# Patient Record
Sex: Female | Born: 1940
Health system: Southern US, Community
[De-identification: ages and names within clinical notes are randomized; demographics above are authoritative.]

## PROBLEM LIST (undated history)

## (undated) DIAGNOSIS — I10 Essential (primary) hypertension: Secondary | ICD-10-CM

## (undated) DIAGNOSIS — E785 Hyperlipidemia, unspecified: Secondary | ICD-10-CM

## (undated) DIAGNOSIS — Z794 Long term (current) use of insulin: Secondary | ICD-10-CM

## (undated) DIAGNOSIS — J189 Pneumonia, unspecified organism: Secondary | ICD-10-CM

## (undated) DIAGNOSIS — E109 Type 1 diabetes mellitus without complications: Secondary | ICD-10-CM

## (undated) DIAGNOSIS — Z974 Presence of external hearing-aid: Secondary | ICD-10-CM

## (undated) DIAGNOSIS — E782 Mixed hyperlipidemia: Secondary | ICD-10-CM

## (undated) DIAGNOSIS — R229 Localized swelling, mass and lump, unspecified: Secondary | ICD-10-CM

## (undated) DIAGNOSIS — Z973 Presence of spectacles and contact lenses: Secondary | ICD-10-CM

## (undated) DIAGNOSIS — G43909 Migraine, unspecified, not intractable, without status migrainosus: Secondary | ICD-10-CM

## (undated) DIAGNOSIS — G4710 Hypersomnia, unspecified: Secondary | ICD-10-CM

## (undated) DIAGNOSIS — E119 Type 2 diabetes mellitus without complications: Secondary | ICD-10-CM

## (undated) DIAGNOSIS — E039 Hypothyroidism, unspecified: Secondary | ICD-10-CM

## (undated) DIAGNOSIS — G47411 Narcolepsy with cataplexy: Secondary | ICD-10-CM

## (undated) HISTORY — PX: OTHER SURGICAL HISTORY: SHX169

## (undated) HISTORY — DX: Essential (primary) hypertension: I10

## (undated) HISTORY — DX: Migraine, unspecified, not intractable, without status migrainosus: G43.909

## (undated) HISTORY — DX: Narcolepsy with cataplexy: G47.411

## (undated) HISTORY — PX: PARTIAL HYSTERECTOMY: SHX80

## (undated) HISTORY — DX: Pneumonia, unspecified organism: J18.9

## (undated) HISTORY — DX: Hyperlipidemia, unspecified: E78.5

## (undated) HISTORY — DX: Hypothyroidism, unspecified: E03.9

---
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
## 1969-06-09 HISTORY — PX: VAGINAL HYSTERECTOMY: SUR661

## 1975-02-05 HISTORY — PX: POSTERIOR CERVICAL FUSION/FORAMINOTOMY: SHX5038

## 2001-08-07 DIAGNOSIS — J189 Pneumonia, unspecified organism: Secondary | ICD-10-CM

## 2001-08-07 HISTORY — DX: Pneumonia, unspecified organism: J18.9

## 2001-08-11 ENCOUNTER — Inpatient Hospital Stay (HOSPITAL_COMMUNITY): Admission: EM | Admit: 2001-08-11 | Discharge: 2001-08-13 | Payer: Self-pay | Admitting: *Deleted

## 2001-08-12 ENCOUNTER — Encounter: Payer: Self-pay | Admitting: Family Medicine

## 2001-08-23 ENCOUNTER — Encounter: Admission: RE | Admit: 2001-08-23 | Discharge: 2001-08-23 | Payer: Self-pay | Admitting: Family Medicine

## 2001-12-06 ENCOUNTER — Emergency Department (HOSPITAL_COMMUNITY): Admission: EM | Admit: 2001-12-06 | Discharge: 2001-12-06 | Payer: Self-pay

## 2004-11-28 ENCOUNTER — Ambulatory Visit: Payer: Self-pay | Admitting: Family Medicine

## 2005-03-09 ENCOUNTER — Encounter: Payer: Self-pay | Admitting: Family Medicine

## 2005-03-13 ENCOUNTER — Ambulatory Visit: Payer: Self-pay | Admitting: Family Medicine

## 2005-03-13 ENCOUNTER — Other Ambulatory Visit: Admission: RE | Admit: 2005-03-13 | Discharge: 2005-03-13 | Payer: Self-pay | Admitting: Family Medicine

## 2005-03-13 ENCOUNTER — Encounter: Payer: Self-pay | Admitting: Family Medicine

## 2005-04-10 ENCOUNTER — Ambulatory Visit: Payer: Self-pay | Admitting: Family Medicine

## 2005-04-23 ENCOUNTER — Ambulatory Visit: Payer: Self-pay | Admitting: Internal Medicine

## 2005-05-09 ENCOUNTER — Encounter: Admission: RE | Admit: 2005-05-09 | Discharge: 2005-05-09 | Payer: Self-pay | Admitting: Family Medicine

## 2005-10-08 ENCOUNTER — Ambulatory Visit: Payer: Self-pay | Admitting: Family Medicine

## 2005-10-27 ENCOUNTER — Ambulatory Visit: Payer: Self-pay | Admitting: Family Medicine

## 2006-03-10 ENCOUNTER — Ambulatory Visit: Payer: Self-pay | Admitting: Internal Medicine

## 2006-04-09 ENCOUNTER — Ambulatory Visit: Payer: Self-pay | Admitting: Family Medicine

## 2006-05-11 ENCOUNTER — Ambulatory Visit: Payer: Self-pay | Admitting: Internal Medicine

## 2006-05-15 ENCOUNTER — Ambulatory Visit: Payer: Self-pay | Admitting: Family Medicine

## 2006-05-20 ENCOUNTER — Encounter: Payer: Self-pay | Admitting: Family Medicine

## 2006-05-20 ENCOUNTER — Ambulatory Visit: Payer: Self-pay | Admitting: Internal Medicine

## 2006-05-20 LAB — CONVERTED CEMR LAB: Hgb A1c MFr Bld: 7.2 %

## 2006-09-02 ENCOUNTER — Encounter: Payer: Self-pay | Admitting: Family Medicine

## 2006-09-02 DIAGNOSIS — G47411 Narcolepsy with cataplexy: Secondary | ICD-10-CM | POA: Insufficient documentation

## 2006-09-02 DIAGNOSIS — E1169 Type 2 diabetes mellitus with other specified complication: Secondary | ICD-10-CM | POA: Insufficient documentation

## 2006-09-02 DIAGNOSIS — I10 Essential (primary) hypertension: Secondary | ICD-10-CM | POA: Insufficient documentation

## 2006-09-02 DIAGNOSIS — E039 Hypothyroidism, unspecified: Secondary | ICD-10-CM | POA: Insufficient documentation

## 2006-09-02 DIAGNOSIS — Z8709 Personal history of other diseases of the respiratory system: Secondary | ICD-10-CM | POA: Insufficient documentation

## 2006-09-02 DIAGNOSIS — E785 Hyperlipidemia, unspecified: Secondary | ICD-10-CM

## 2006-09-02 DIAGNOSIS — E1165 Type 2 diabetes mellitus with hyperglycemia: Secondary | ICD-10-CM | POA: Insufficient documentation

## 2006-10-19 ENCOUNTER — Encounter: Admission: RE | Admit: 2006-10-19 | Discharge: 2006-10-19 | Payer: Self-pay | Admitting: Family Medicine

## 2006-10-21 ENCOUNTER — Encounter (INDEPENDENT_AMBULATORY_CARE_PROVIDER_SITE_OTHER): Payer: Self-pay | Admitting: *Deleted

## 2006-10-21 LAB — HM MAMMOGRAPHY: HM Mammogram: NORMAL

## 2006-11-11 ENCOUNTER — Encounter: Payer: Self-pay | Admitting: Family Medicine

## 2007-01-14 ENCOUNTER — Ambulatory Visit: Payer: Self-pay | Admitting: Family Medicine

## 2007-01-15 LAB — CONVERTED CEMR LAB
ALT: 39 units/L — ABNORMAL HIGH (ref 0–35)
AST: 30 units/L (ref 0–37)
Basophils Absolute: 0 10*3/uL (ref 0.0–0.1)
Basophils Relative: 0.3 % (ref 0.0–1.0)
Calcium: 8.8 mg/dL (ref 8.4–10.5)
Chloride: 101 meq/L (ref 96–112)
Creatinine, Ser: 0.8 mg/dL (ref 0.4–1.2)
Eosinophils Relative: 1.8 % (ref 0.0–5.0)
GFR calc Af Amer: 92 mL/min
GFR calc non Af Amer: 76 mL/min
Glucose, Bld: 152 mg/dL — ABNORMAL HIGH (ref 70–99)
HCT: 39.1 % (ref 36.0–46.0)
Hemoglobin: 13.6 g/dL (ref 12.0–15.0)
LDL Cholesterol: 78 mg/dL (ref 0–99)
MCHC: 34.7 g/dL (ref 30.0–36.0)
Monocytes Absolute: 0.5 10*3/uL (ref 0.2–0.7)
Neutrophils Relative %: 57.7 % (ref 43.0–77.0)
RBC: 4.38 M/uL (ref 3.87–5.11)
RDW: 12.5 % (ref 11.5–14.6)
TSH: 3.62 microintl units/mL (ref 0.35–5.50)
Total CHOL/HDL Ratio: 3.7
VLDL: 31 mg/dL (ref 0–40)
WBC: 7.4 10*3/uL (ref 4.5–10.5)

## 2007-02-22 ENCOUNTER — Ambulatory Visit: Payer: Self-pay | Admitting: Family Medicine

## 2007-04-02 ENCOUNTER — Ambulatory Visit: Payer: Self-pay | Admitting: Family Medicine

## 2007-04-12 ENCOUNTER — Encounter: Admission: RE | Admit: 2007-04-12 | Discharge: 2007-04-26 | Payer: Self-pay | Admitting: Family Medicine

## 2007-04-13 ENCOUNTER — Ambulatory Visit: Payer: Self-pay | Admitting: Family Medicine

## 2007-04-19 ENCOUNTER — Encounter: Payer: Self-pay | Admitting: Family Medicine

## 2007-05-19 ENCOUNTER — Ambulatory Visit: Payer: Self-pay | Admitting: Family Medicine

## 2007-05-21 LAB — CONVERTED CEMR LAB
BUN: 15 mg/dL (ref 6–23)
CO2: 29 meq/L (ref 19–32)
GFR calc Af Amer: 80 mL/min
Glucose, Bld: 130 mg/dL — ABNORMAL HIGH (ref 70–99)
Phosphorus: 3.8 mg/dL (ref 2.3–4.6)
Potassium: 4 meq/L (ref 3.5–5.1)

## 2007-07-07 ENCOUNTER — Telehealth: Payer: Self-pay | Admitting: Family Medicine

## 2007-07-20 ENCOUNTER — Encounter: Payer: Self-pay | Admitting: Family Medicine

## 2007-10-25 ENCOUNTER — Encounter: Payer: Self-pay | Admitting: Family Medicine

## 2007-10-25 ENCOUNTER — Telehealth: Payer: Self-pay | Admitting: Family Medicine

## 2007-12-17 ENCOUNTER — Encounter: Payer: Self-pay | Admitting: Family Medicine

## 2008-03-02 ENCOUNTER — Ambulatory Visit: Payer: Self-pay | Admitting: Family Medicine

## 2008-03-03 LAB — CONVERTED CEMR LAB
Albumin: 4.3 g/dL (ref 3.5–5.2)
BUN: 16 mg/dL (ref 6–23)
Calcium: 8.8 mg/dL (ref 8.4–10.5)
Cholesterol: 151 mg/dL (ref 0–200)
Creatinine, Ser: 0.8 mg/dL (ref 0.4–1.2)
LDL Cholesterol: 73 mg/dL (ref 0–99)
Phosphorus: 3.6 mg/dL (ref 2.3–4.6)
Triglycerides: 167 mg/dL — ABNORMAL HIGH (ref 0–149)
VLDL: 33 mg/dL (ref 0–40)

## 2008-04-28 ENCOUNTER — Encounter: Payer: Self-pay | Admitting: Family Medicine

## 2008-04-28 ENCOUNTER — Telehealth: Payer: Self-pay | Admitting: Family Medicine

## 2008-11-20 ENCOUNTER — Telehealth: Payer: Self-pay | Admitting: Family Medicine

## 2008-12-21 ENCOUNTER — Ambulatory Visit: Payer: Self-pay | Admitting: Family Medicine

## 2008-12-21 DIAGNOSIS — B351 Tinea unguium: Secondary | ICD-10-CM | POA: Insufficient documentation

## 2009-01-01 LAB — CONVERTED CEMR LAB
ALT: 35 units/L (ref 0–35)
BUN: 18 mg/dL (ref 6–23)
Bilirubin, Direct: 0.1 mg/dL (ref 0.0–0.3)
CO2: 30 meq/L (ref 19–32)
Creatinine, Ser: 0.9 mg/dL (ref 0.4–1.2)
Creatinine,U: 115.6 mg/dL
Eosinophils Relative: 1.9 % (ref 0.0–5.0)
HDL: 49.3 mg/dL (ref >39.00)
LDL Cholesterol: 110 mg/dL — ABNORMAL HIGH (ref 0–99)
MCV: 89.5 fL (ref 78.0–100.0)
Microalb, Ur: 0.6 mg/dL (ref 0.0–1.9)
Monocytes Absolute: 0.5 10*3/uL (ref 0.1–1.0)
Neutrophils Relative %: 59 % (ref 43.0–77.0)
Phosphorus: 4.4 mg/dL (ref 2.3–4.6)
Platelets: 229 10*3/uL (ref 150.0–400.0)
Total Bilirubin: 0.7 mg/dL (ref 0.3–1.2)
Total CHOL/HDL Ratio: 4
Triglycerides: 197 mg/dL — ABNORMAL HIGH (ref 0.0–149.0)
VLDL: 39.4 mg/dL (ref 0.0–40.0)
WBC: 7.3 10*3/uL (ref 4.5–10.5)

## 2009-04-04 ENCOUNTER — Ambulatory Visit: Payer: Self-pay | Admitting: Family Medicine

## 2009-04-09 LAB — CONVERTED CEMR LAB
ALT: 30 units/L (ref 0–35)
AST: 23 units/L (ref 0–37)
Cholesterol: 201 mg/dL — ABNORMAL HIGH (ref 0–200)
Hgb A1c MFr Bld: 7 % — ABNORMAL HIGH (ref 4.6–6.5)
Total CHOL/HDL Ratio: 5
Triglycerides: 194 mg/dL — ABNORMAL HIGH (ref 0.0–149.0)

## 2009-04-11 ENCOUNTER — Ambulatory Visit: Payer: Self-pay | Admitting: Family Medicine

## 2009-07-17 ENCOUNTER — Ambulatory Visit: Payer: Self-pay | Admitting: Family Medicine

## 2009-07-17 LAB — CONVERTED CEMR LAB
ALT: 28 units/L (ref 0–35)
AST: 18 units/L (ref 0–37)
Calcium: 8.8 mg/dL (ref 8.4–10.5)
Creatinine, Ser: 0.8 mg/dL (ref 0.4–1.2)
Direct LDL: 101 mg/dL
GFR calc non Af Amer: 75.43 mL/min (ref >60)
Glucose, Bld: 123 mg/dL — ABNORMAL HIGH (ref 70–99)
Hgb A1c MFr Bld: 6.9 % — ABNORMAL HIGH (ref 4.6–6.5)
Phosphorus: 4.6 mg/dL (ref 2.3–4.6)
Potassium: 4.2 meq/L (ref 3.5–5.1)
Sodium: 140 meq/L (ref 135–145)
Total CHOL/HDL Ratio: 4

## 2009-07-23 ENCOUNTER — Ambulatory Visit: Payer: Self-pay | Admitting: Family Medicine

## 2009-10-08 LAB — HM DIABETES EYE EXAM: HM Diabetic Eye Exam: NORMAL

## 2009-10-22 ENCOUNTER — Ambulatory Visit: Payer: Self-pay | Admitting: Family Medicine

## 2009-10-22 LAB — CONVERTED CEMR LAB
AST: 22 units/L (ref 0–37)
Cholesterol: 201 mg/dL — ABNORMAL HIGH (ref 0–200)
Direct LDL: 116.3 mg/dL
Hgb A1c MFr Bld: 6.6 % — ABNORMAL HIGH (ref 4.6–6.5)
VLDL: 43 mg/dL — ABNORMAL HIGH (ref 0.0–40.0)

## 2009-10-31 ENCOUNTER — Ambulatory Visit: Payer: Self-pay | Admitting: Family Medicine

## 2009-12-13 ENCOUNTER — Ambulatory Visit: Payer: Self-pay | Admitting: Family Medicine

## 2009-12-17 LAB — CONVERTED CEMR LAB
ALT: 41 units/L — ABNORMAL HIGH (ref 0–35)
Triglycerides: 209 mg/dL — ABNORMAL HIGH (ref 0.0–149.0)

## 2010-01-22 ENCOUNTER — Ambulatory Visit: Payer: Self-pay | Admitting: Family Medicine

## 2010-01-28 LAB — CONVERTED CEMR LAB
ALT: 31 units/L (ref 0–35)
Alkaline Phosphatase: 77 units/L (ref 39–117)
Bilirubin, Direct: 0.1 mg/dL (ref 0.0–0.3)
Total Bilirubin: 0.5 mg/dL (ref 0.3–1.2)

## 2010-04-29 ENCOUNTER — Ambulatory Visit: Payer: Self-pay | Admitting: Family Medicine

## 2010-04-30 LAB — CONVERTED CEMR LAB
Albumin: 4.1 g/dL (ref 3.5–5.2)
CO2: 28 meq/L (ref 19–32)
Calcium: 8.8 mg/dL (ref 8.4–10.5)
Cholesterol: 286 mg/dL — ABNORMAL HIGH (ref 0–200)
Creatinine, Ser: 0.8 mg/dL (ref 0.4–1.2)
Potassium: 4.2 meq/L (ref 3.5–5.1)
Sodium: 138 meq/L (ref 135–145)
Total CHOL/HDL Ratio: 6
Triglycerides: 292 mg/dL — ABNORMAL HIGH (ref 0.0–149.0)
VLDL: 58.4 mg/dL — ABNORMAL HIGH (ref 0.0–40.0)

## 2010-05-06 ENCOUNTER — Ambulatory Visit: Payer: Self-pay | Admitting: Family Medicine

## 2010-05-06 DIAGNOSIS — F411 Generalized anxiety disorder: Secondary | ICD-10-CM | POA: Insufficient documentation

## 2010-05-06 LAB — HM DIABETES FOOT EXAM

## 2010-05-08 ENCOUNTER — Ambulatory Visit: Payer: Self-pay | Admitting: Psychology

## 2010-05-15 ENCOUNTER — Ambulatory Visit: Payer: Self-pay | Admitting: Psychology

## 2010-06-04 ENCOUNTER — Telehealth (INDEPENDENT_AMBULATORY_CARE_PROVIDER_SITE_OTHER): Payer: Self-pay | Admitting: *Deleted

## 2010-06-06 ENCOUNTER — Ambulatory Visit: Payer: Self-pay | Admitting: Family Medicine

## 2010-06-12 LAB — CONVERTED CEMR LAB
ALT: 33 units/L (ref 0–35)
AST: 23 units/L (ref 0–37)
Albumin: 4.2 g/dL (ref 3.5–5.2)
Alkaline Phosphatase: 87 units/L (ref 39–117)
BUN: 25 mg/dL — ABNORMAL HIGH (ref 6–23)
Basophils Absolute: 0 10*3/uL (ref 0.0–0.1)
Chloride: 101 meq/L (ref 96–112)
Cholesterol: 149 mg/dL (ref 0–200)
Creatinine, Ser: 0.9 mg/dL (ref 0.4–1.2)
Eosinophils Relative: 1.8 % (ref 0.0–5.0)
GFR calc non Af Amer: 70.16 mL/min (ref >60.00)
Glucose, Bld: 139 mg/dL — ABNORMAL HIGH (ref 70–99)
Hgb A1c MFr Bld: 7.2 % — ABNORMAL HIGH (ref 4.6–6.5)
Lymphocytes Relative: 30.9 % (ref 12.0–46.0)
Monocytes Relative: 5.7 % (ref 3.0–12.0)
Neutrophils Relative %: 61.3 % (ref 43.0–77.0)
Phosphorus: 4.5 mg/dL (ref 2.3–4.6)
Platelets: 246 10*3/uL (ref 150.0–400.0)
RDW: 13.1 % (ref 11.5–14.6)
TSH: 4.69 microintl units/mL (ref 0.35–5.50)
Total Protein: 7.1 g/dL (ref 6.0–8.3)
VLDL: 54.8 mg/dL — ABNORMAL HIGH (ref 0.0–40.0)
WBC: 8.8 10*3/uL (ref 4.5–10.5)

## 2010-07-09 NOTE — Assessment & Plan Note (Signed)
Summary: 6 MONTH FOLLOW UP/[facility]H   Vital Signs:  Patient profile:   70 year old female Height:      61.5 inches Weight:      169.25 pounds BMI:     31.58 Temp:     98.3 degrees F oral Pulse rate:   100 / minute Pulse rhythm:   regular BP sitting:   162 / 76  (left arm) Cuff size:   regular  Vitals Entered By: Lewanda Rife LPN (May 06, 2010 8:11 AM) CC: six month f/u Comments  Pt has not had BP med in 2 days.   History of Present Illness: here for f/u of HTN and DM and lipids   no bp med in 2 days -- bp is 162/76 that explains that  just ran out of refils   DM worse with AIc 7.2 up from 6.6 has been very stressed out -- and she is eating to compensate for that  the holidays are anx provoking since her husband died  is trying to read more -- that relaxes  is on feet all day at work -- really busy at replacements    lipids way up with trig 292 andDl 48 and LDL 172 up from 86 is off simvastatin because she ran out of it and she threw away the bottle    wt is up 2 lb with bmi of 31    Allergies: 1)  ! Penicillin  Past History:  Past Medical History: Last updated: 09/02/2006 Diabetes mellitus, type II Hyperlipidemia Hypertension Hypothyroidism Pneumonia, hx of  Past Surgical History: Last updated: 09/02/2006 Appendectomy,age 37 Hysterectomy, partial, age 34 osteopenia, DEXA, 12.2006 pneunomia, admit, 3.2003 foraminotomy,c-5,c-6,c-7, 8.29.1976 hemilaminectomy,c-5-6,c-6-7  Family History: Last updated: 10/31/2009 mother MI, PVD, severe high cholesterol  father deceased at 53 brother depression, suicide brother DM  Social History: Last updated: 04/11/2009 non smoker walks for exercise lives with daughter   Risk Factors: Smoking Status: quit (09/02/2006)  Review of Systems General:  Complains of fatigue; denies fever, loss of appetite, and malaise. Eyes:  Denies blurring and eye irritation. CV:  Denies chest pain or discomfort, palpitations,  and shortness of breath with exertion. Resp:  Denies cough, shortness of breath, and wheezing. GI:  Denies abdominal pain, change in bowel habits, indigestion, and nausea. GU:  Denies urinary frequency. MS:  Denies muscle aches and cramps. Derm:  Denies itching, lesion(s), poor wound healing, and rash. Neuro:  Denies numbness and tingling. Psych:  Complains of anxiety, depression, easily tearful, and irritability; denies sense of great danger and suicidal thoughts/plans. Endo:  Denies cold intolerance and heat intolerance. Heme:  Denies abnormal bruising and bleeding.  Physical Exam  General:  overweight but generally well appearing  Head:  normocephalic, atraumatic, and no abnormalities observed.   Eyes:  vision grossly intact, pupils equal, pupils round, and pupils reactive to light.  no conjunctival pallor, injection or icterus  Mouth:  pharynx pink and moist.   Neck:  supple with full rom and no masses or thyromegally, no JVD or carotid bruit  Lungs:  Normal respiratory effort, chest expands symmetrically. Lungs are clear to auscultation, no crackles or wheezes. Heart:  Normal rate and regular rhythm. S1 and S2 normal without gallop, murmur, click, rub or other extra sounds. Abdomen:  Bowel sounds positive,abdomen soft and non-tender without masses, organomegaly or hernias noted. no renal bruits  Msk:  No deformity or scoliosis noted of thoracic or lumbar spine.   Pulses:  R and L carotid,radial,femoral,dorsalis pedis and posterior  tibial pulses are full and equal bilaterally Extremities:  No clubbing, cyanosis, edema, or deformity noted with normal full range of motion of all joints.   Neurologic:  sensation intact to light touch, gait normal, and DTRs symmetrical and normal.   Skin:  Intact without suspicious lesions or rashes Cervical Nodes:  No lymphadenopathy noted Psych:  pt is down in the dumps in general -- lack of motivation and energy good eye contact and communication  skills, however   Diabetes Management Exam:    Foot Exam (with socks and/or shoes not present):       Sensory-Pinprick/Light touch:          Left medial foot (L-4): normal          Left dorsal foot (L-5): normal          Left lateral foot (S-1): normal          Right medial foot (L-4): normal          Right dorsal foot (L-5): normal          Right lateral foot (S-1): normal       Sensory-Monofilament:          Left foot: normal          Right foot: normal       Inspection:          Left foot: normal          Right foot: normal       Nails:          Left foot: normal          Right foot: normal   Impression & Recommendations:  Problem # 1:  ANXIETY (ICD-300.00) Assessment New this is worsening with grief issues  unfortunately -- emotional eating is causing DM to worsen ref to counselor f/u  in 1 month- will also disc in more detail then Her updated medication list for this problem includes:    Imipramine Hcl 25 Mg Tabs (Imipramine hcl) .Marland Kitchen... 2 by mouth at bedtime  Orders: Psychology Referral (Psychology)  Problem # 2:  HYPERTENSION (ICD-401.9) Assessment: Deteriorated  this is out of control off med get started back on it and f/u 1 mo  med refilled  Her updated medication list for this problem includes:    Zestril 10 Mg Tabs (Lisinopril) .Marland Kitchen... 1 by mouth once daily  BP today: 162/76 Prior BP: 122/70 (10/31/2009)  Labs Reviewed: K+: 4.2 (04/29/2010) Creat: : 0.8 (04/29/2010)   Chol: 286 (04/29/2010)   HDL: 48.10 (04/29/2010)   LDL: 110 (12/21/2008)   TG: 292.0 (04/29/2010)  Problem # 3:  HYPERLIPIDEMIA (ICD-272.4) Assessment: Deteriorated  out of control off statin and with poor diet rev low sat fat diet  meds renewed  at her 1 mo f/u will plan next lab draw Her updated medication list for this problem includes:    Simvastatin 80 Mg Tabs (Simvastatin) .Marland Kitchen... 1 by mouth once daily    Zetia 10 Mg Tabs (Ezetimibe) .Marland Kitchen... 1 by mouth once daily  Labs  Reviewed: SGOT: 21 (04/29/2010)   SGPT: 36 (04/29/2010)   HDL:48.10 (04/29/2010), 45.60 (12/13/2009)  LDL:110 (12/21/2008), 73 (16/03/9603)  Chol:286 (04/29/2010), 161 (12/13/2009)  Trig:292.0 (04/29/2010), 209.0 (12/13/2009)  Problem # 4:  DIABETES MELLITUS, TYPE II (ICD-250.00) Assessment: Deteriorated this is worse due to poor diet and emotional eating  disc DM diet in detail and ref to counseling for anx  disc need for exercise  hope for imp with better eating  opthy utd and good foot care  Her updated medication list for this problem includes:    Zestril 10 Mg Tabs (Lisinopril) .Marland Kitchen... 1 by mouth once daily    Metformin Hcl 500 Mg Tb24 (Metformin hcl) .Marland Kitchen... 1/2 tablet by mouth in am and 1 by mouth in pm    Aspirin 81 Mg Tabs (Aspirin) .Marland Kitchen... Take 1 tablet by mouth once a day  Complete Medication List: 1)  Synthroid 50 Mcg Tabs (Levothyroxine sodium) .... One by mouth daily 2)  Imipramine Hcl 25 Mg Tabs (Imipramine hcl) .... 2 by mouth at bedtime 3)  Ritalin La 10 Mg Cp24 (Methylphenidate hcl) .... One by mouth four times a day 4)  Zestril 10 Mg Tabs (Lisinopril) .Marland Kitchen.. 1 by mouth once daily 5)  Transderm-scop 1.5 Mg Pt72 (Scopolamine base) .... Use 1 patch as directed every 3 days as needed motion sickness 6)  Glucometer  .... To check glucose once daily and as needed for dm 250.0 7)  Glucose Test Strips and Lancets  .... To check glucose once daily and as needed for dm 250.0 8)  Metformin Hcl 500 Mg Tb24 (Metformin hcl) .... 1/2 tablet by mouth in am and 1 by mouth in pm 9)  Simvastatin 80 Mg Tabs (Simvastatin) .Marland Kitchen.. 1 by mouth once daily 10)  Zetia 10 Mg Tabs (Ezetimibe) .Marland Kitchen.. 1 by mouth once daily 11)  Aspirin 81 Mg Tabs (Aspirin) .... Take 1 tablet by mouth once a day 12)  Ibuprofen 200 Mg Tabs (Ibuprofen) .... Otc as directed. 13)  Mucinex Dm Maximum Strength 60-1200 Mg Xr12h-tab (Dextromethorphan-guaifenesin) .... Otc as directed.  Patient Instructions: 1)  make sure to mention  your anxiety to your neurologist  2)  you must not eat for stress- reading/ walking and talking to friends are better alternatives  3)  get back on your medicines  4)  follow up in 1 month (non fasting ) -- please make sure you take your medicines that day  5)  we will refer you to a counselor for anxiety at check out  6)  try to stick to a diabetic diet Prescriptions: ZETIA 10 MG TABS (EZETIMIBE) 1 by mouth once daily  #30 x 11   Entered and Authorized by:   Judith Part MD   Signed by:   Judith Part MD on 05/06/2010   Method used:   [facility] to        [facility]  [address] [phone]* (retail)       [address]       [place], [place]  [address]       Ph: [phone] or [phone]       Fax: [phone]   RxID:   [id number] SIMVASTATIN 80 MG TABS (SIMVASTATIN) 1 by mouth once daily  #30 x 11   Entered and Authorized by:   Judith Part MD   Signed by:   Judith Part MD on 05/06/2010   Method used:   [facility]        [facility] [address] [phone]* (retail)       [address]       [place], [place]  [address]       Ph: [phone] or [phone]       Fax: [phone]   RxID:   [id number] ZESTRIL 10 MG  TABS (LISINOPRIL) 1 by mouth once daily  #30  x 11   Entered and Authorized by:   Judith Part MD   Signed by:   Judith Part MD on 05/06/2010   Method used:   [facility]        [facility] [address] [phone]* (retail)       [address]       [place], [place]  [address]       Ph: [phone] or [phone]       Fax: [phone]   RxID:   [id number]    Orders Added: 1)  Psychology Referral [Psychology] 2)  Est. Patient Level IV [51884]   Immunization History:  Influenza Immunization History:    Influenza:  historical received at work (04/09/2010)   Immunization History:  Influenza Immunization History:    Influenza:  Historical received at  work (04/09/2010)   Current Allergies (reviewed today): ! PENICILLIN

## 2010-07-09 NOTE — Assessment & Plan Note (Signed)
Summary: 3 MONTH FOLLOW UP/[facility]   Vital Signs:  Patient profile:   70 year old female Height:      61.5 inches Weight:      169.25 pounds BMI:     31.58 Temp:     97.9 degrees F oral Pulse rate:   92 / minute Pulse rhythm:   regular BP sitting:   130 / 70  (left arm) Cuff size:   regular  Vitals Entered By: Lewanda Rife LPN (July 23, 2009 8:38 AM)  History of Present Illness: here for f/u of lipid/ DM / HTN  is feeling good  nothing new -- but tired from working a lot    wt is stable   bp good at 130/70 today- this has been well controlled   AIC is down from 7 to 6.9- not much change did inc metformin at last visit diet is better- staying away from sugar  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 Bili:  0.7 (12/21/2008 9:10:02 AM)Alk Phos:  90 (12/21/2008 9:10:02 AM)  is open to adding zetia     Allergies: 1)  ! Penicillin  Past History:  Past Medical History: Last updated: 09/02/2006 Diabetes mellitus, type II Hyperlipidemia Hypertension Hypothyroidism Pneumonia, hx of  Past Surgical History: Last updated: 09/02/2006 Appendectomy,age 11 Hysterectomy, partial, age 89 osteopenia, DEXA, 12.2006 pneunomia, admit, 3.2003 foraminotomy,c-5,c-6,c-7, 8.29.1976 hemilaminectomy,c-5-6,c-6-7  Family History: Last updated: 03/02/2008 mother MI, PVD father deceased at 15 brother depression, suicide brother DM  Social History: Last updated: 04/11/2009 non  smoker walks for exercise lives with daughter   Risk Factors: Smoking Status: quit (09/02/2006)  Review of Systems General:  Denies fatigue, fever, loss of appetite, and malaise. Eyes:  Denies blurring and eye pain. CV:  Denies chest pain or discomfort, lightheadness, palpitations, and shortness of breath with exertion. Resp:  Denies cough and wheezing. GI:  Denies abdominal pain, bloody stools, change in bowel habits, and constipation. GU:  Denies dysuria. MS:  Denies muscle aches. Derm:  Denies lesion(s), poor wound healing, and rash. Neuro:  Denies numbness and tingling. Psych:  mood is ok. Endo:  Denies excessive thirst and excessive urination. Heme:  Denies abnormal bruising and bleeding.  Physical Exam  General:  overweight but generally well appearing  Head:  normocephalic, atraumatic, and no abnormalities observed.   Eyes:  vision grossly intact, pupils equal, pupils round, and pupils reactive to light.   Mouth:  pharynx pink and moist.   Neck:  supple with full rom and no masses or thyromegally, no JVD or carotid bruit  Chest Wall:  No deformities, masses, or tenderness noted. Lungs:  Normal respiratory effort, chest expands symmetrically. Lungs are clear to auscultation, no crackles or wheezes. Heart:  Normal rate and regular rhythm. S1 and S2 normal without gallop, murmur, click, rub or other extra sounds. Msk:  No deformity or scoliosis noted of thoracic or lumbar spine.   Pulses:  R and L carotid,radial,femoral,dorsalis pedis and posterior tibial  pulses are full and equal bilaterally Extremities:  No clubbing, cyanosis, edema, or deformity noted with normal full range of motion of all joints.   Neurologic:  sensation intact to light touch, gait normal, and DTRs symmetrical and normal.   Skin:  Intact without suspicious lesions or rashes Cervical Nodes:  No lymphadenopathy noted Inguinal Nodes:  No significant adenopathy Psych:  normal affect, talkative and pleasant    Diabetes Management Exam:    Foot Exam (with socks and/or shoes not present):       Sensory-Pinprick/Light touch:          Left medial foot (L-4): normal          Left dorsal foot (L-5): normal          Left lateral foot (S-1): normal          Right medial foot (L-4): normal          Right dorsal foot (L-5): normal          Right lateral foot (S-1): normal       Sensory-Monofilament:          Left foot: normal          Right foot: normal       Inspection:          Left foot: normal          Right foot: normal       Nails:          Left foot: thickened          Right foot: thickened   Impression & Recommendations:  Problem # 1:  HYPOTHYROIDISM (ICD-244.9) Assessment Unchanged  theraputic tsh with no clinical changes  rev with pt Her updated medication list for this problem includes:    Synthroid 50 Mcg Tabs (Levothyroxine sodium) ..... One by mouth daily  Labs Reviewed: TSH: 4.38 (12/21/2008)    HgBA1c: 6.9 (07/17/2009) Chol: 189 (07/17/2009)   HDL: 47.20 (07/17/2009)   LDL: 110 (12/21/2008)   TG: 324.0 (07/17/2009)  Problem # 2:  HYPERTENSION (ICD-401.9) Assessment: Unchanged  good control without change urged to start 20 min exercise per day f.u 3 mo  Her updated medication list for this problem includes:    Toprol Xl 50 Mg Tb24 (Metoprolol succinate) .Marland Kitchen... Take one in am    Zestril 10 Mg Tabs (Lisinopril) .Marland Kitchen... 1 by mouth once daily  BP today: 130/70 Prior BP: 130/68 (04/11/2009)  Labs Reviewed: K+: 4.2 (07/17/2009) Creat: : 0.8 (07/17/2009)   Chol: 189 (07/17/2009)   HDL: 47.20 (07/17/2009)   LDL: 110 (12/21/2008)   TG: 324.0 (07/17/2009)  Problem # 3:  HYPERLIPIDEMIA (ICD-272.4) Assessment: Improved overall not quite to goal of LDL 70 add zetia to max dose simvastatin and update lab and f/u 3 mo  counseled on low sat fat diet  Her updated medication list for this problem includes:    Simvastatin 80 Mg Tabs (Simvastatin) .Marland Kitchen... 1 by mouth once daily     Zetia 10 Mg Tabs (Ezetimibe) .Marland Kitchen... 1 by mouth once daily  Problem # 4:  DIABETES MELLITUS, TYPE II (ICD-250.00) Assessment: Improved  not quite at goal opthy this week  curious about 2 h pp sugars disc healthy diet (low simple sugar/ choose complex carbs/ low sat fat) diet and exercise in detail f/u 3 mo with sugar log-- will check two times a day until pattern is established  Her updated medication list for this problem includes:    Zestril 10 Mg Tabs (  Lisinopril) .Marland Kitchen... 1 by mouth once daily    Metformin Hcl 500 Mg Tb24 (Metformin hcl) .Marland Kitchen... 1/2 tablet by mouth in am and 1 by mouth in pm  Labs Reviewed: Creat: 0.8 (07/17/2009)     Last Eye Exam: normal (12/08/2007) Reviewed HgBA1c results: 6.9 (07/17/2009)  7.0 (04/04/2009)  Complete Medication List: 1)  Synthroid 50 Mcg Tabs (Levothyroxine sodium) .... One by mouth daily 2)  Toprol Xl 50 Mg Tb24 (Metoprolol succinate) .... Take one in am 3)  Imipramine Hcl 25 Mg Tabs (Imipramine hcl) .... 2 by mouth q hs 4)  Lexapro 10 Mg Tabs (Escitalopram oxalate) .Marland Kitchen.. 1 by mouth qd 5)  Ritalin La 10 Mg Cp24 (Methylphenidate hcl) .... One by mouth qid 6)  Zestril 10 Mg Tabs (Lisinopril) .Marland Kitchen.. 1 by mouth once daily 7)  Transderm-scop 1.5 Mg Pt72 (Scopolamine base) .... Use 1 patch as directed every 3 days as needed motion sickness 8)  Glucometer  .... To check glucose once daily and as needed for dm 250.0 9)  Glucose Test Strips and Lancets  .... To check glucose once daily and as needed for dm 250.0 10)  Metformin Hcl 500 Mg Tb24 (Metformin hcl) .... 1/2 tablet by mouth in am and 1 by mouth in pm 11)  Transderm-scop 1.5 Mg Pt72 (Scopolamine base) .Marland Kitchen.. 1 patch- apply as directed every 3 days for motion sickness 12)  Simvastatin 80 Mg Tabs (Simvastatin) .Marland Kitchen.. 1 by mouth once daily 13)  Tussionex Pennkinetic Er 8-10 Mg/13ml Lqcr (Chlorpheniramine-hydrocodone) .... 1/2 to 1 teaspoon by mouth up to two times a day as needed severe cough  watch for  sedation 14)  Zetia 10 Mg Tabs (Ezetimibe) .Marland Kitchen.. 1 by mouth once daily  Patient Instructions: 1)  please start checking your blood sugar in afternoon 2 hours after a meal  2)  start zetia one pill daily with your simvastatin  3)  keep working on low fat and low sugar diet 4)  add 20 minutes of exercise per day- inside or out  5)  fasting labs in 3 months lipid/ast/alt/AIC 250.0, 272 and then follow up Prescriptions: ZETIA 10 MG TABS (EZETIMIBE) 1 by mouth once daily  #30 x 11   Entered and Authorized by:   Judith Part MD   Signed by:   Judith Part MD on 07/23/2009   Method used:   [facility]        [facility] [address] [phone]* (retail)       [address]       [place], [place]  [address]       Ph: [phone] or [phone]       Fax: [phone]   RxID:   [id number]   Current Allergies (reviewed today): ! PENICILLIN

## 2010-07-09 NOTE — Assessment & Plan Note (Signed)
Summary: F/U AFTER LABS / LFW   Vital Signs:  Patient profile:   70 year old female Height:      61.5 inches Weight:      167.25 pounds BMI:     31.20 Temp:     97.9 degrees F oral Pulse rate:   92 / minute Pulse rhythm:   regular BP sitting:   122 / 70  (left arm) Cuff size:   regular  Vitals Entered By: Lewanda Rife LPN (Oct 31, 2009 8:46 AM)  Serial Vital Signs/Assessments:  Time      Position  BP       Pulse  Resp  Temp     By                     122/70                         Judith Part MD  CC: follow-up visit   History of Present Illness: here for f/u of lipid/ and HTN and DM    wt is down 2 lb  first bp today up at 146/72--unusual   lipids - added zetia last time - she is not taking it -- it never got to the pharmacy and called repeatedly  it did not show up on her file at the drugstore  trig down to 215 but LDL up from 101 to 116  AIC is 6.6 down from 6.9  sugars are running good  eye exam nl at [facility] 2 wk ago  did take her medicine today   last night ate poorly for her birthday -- today she turns 1  is taking the week off   exercise - is getting more  lot of yard work and is unboxing at work  more active and wt is down too   diet is good overall -- occ cheats  eats chicken and fish and less red meat     Allergies: 1)  ! Penicillin  Past History:  Past Medical History: Last updated: 09/02/2006 Diabetes mellitus, type II Hyperlipidemia Hypertension Hypothyroidism Pneumonia, hx of  Past Surgical History: Last updated: 09/02/2006 Appendectomy,age 73 Hysterectomy, partial, age 89 osteopenia, DEXA, 12.2006 pneunomia, admit, 3.2003 foraminotomy,c-5,c-6,c-7, 8.29.1976 hemilaminectomy,c-5-6,c-6-7  Family History: Last updated: 10/31/2009 mother MI, PVD, severe high cholesterol  father deceased at 57 brother depression, suicide brother DM  Social History: Last updated: 04/11/2009 non smoker walks for exercise lives  with daughter   Risk Factors: Smoking Status: quit (09/02/2006)  Family History: mother MI, PVD, severe high cholesterol  father deceased at 16 brother depression, suicide brother DM  Review of Systems General:  Denies fatigue, fever, loss of appetite, and malaise. Eyes:  Denies blurring and eye irritation. CV:  Denies chest pain or discomfort and palpitations. Resp:  Denies cough and wheezing. GI:  Denies abdominal pain, change in bowel habits, indigestion, and nausea. GU:  Denies dysuria and urinary frequency. MS:  Denies muscle aches and cramps. Derm:  Denies itching, lesion(s), poor wound healing, and rash. Neuro:  Denies numbness and tingling. Psych:  Denies anxiety and depression. Endo:  Denies excessive thirst and excessive urination. Heme:  Denies abnormal bruising and bleeding.  Physical Exam  General:  overweight but generally well appearing  Head:  normocephalic, atraumatic, and no abnormalities observed.   Mouth:  pharynx pink and moist.   Neck:  supple with full rom and no masses or thyromegally, no JVD or  carotid bruit  Lungs:  Normal respiratory effort, chest expands symmetrically. Lungs are clear to auscultation, no crackles or wheezes. Heart:  Normal rate and regular rhythm. S1 and S2 normal without gallop, murmur, click, rub or other extra sounds. Abdomen:  soft, non-tender, and normal bowel sounds.  no renal bruits  Msk:  No deformity or scoliosis noted of thoracic or lumbar spine.   Pulses:  R and L carotid,radial,femoral,dorsalis pedis and posterior tibial pulses are full and equal bilaterally Extremities:  No clubbing, cyanosis, edema, or deformity noted with normal full range of motion of all joints.   Neurologic:  sensation intact to light touch, gait normal, and DTRs symmetrical and normal.   Skin:  Intact without suspicious lesions or rashes Cervical Nodes:  No lymphadenopathy noted Inguinal Nodes:  No significant adenopathy Psych:  normal affect,  talkative and pleasant   Diabetes Management Exam:    Foot Exam (with socks and/or shoes not present):       Sensory-Pinprick/Light touch:          Left medial foot (L-4): normal          Left dorsal foot (L-5): normal          Left lateral foot (S-1): normal          Right medial foot (L-4): normal          Right dorsal foot (L-5): normal          Right lateral foot (S-1): normal       Sensory-Monofilament:          Left foot: normal          Right foot: normal       Inspection:          Left foot: normal          Right foot: normal       Nails:          Left foot: normal          Right foot: normal    Eye Exam:       Eye Exam done elsewhere          Date: 10/08/2009          Results: normal          Done by: [facility]    Impression & Recommendations:  Problem # 1:  HYPERTENSION (ICD-401.9) Assessment Unchanged  bp better on 2nd check today  no change in med  keep up exercise lab and f/u in 6 mo  The following medications were removed from the medication list:    Toprol Xl 50 Mg Tb24 (Metoprolol succinate) .Marland Kitchen... Take one in am Her updated medication list for this problem includes:    Zestril 10 Mg Tabs (Lisinopril) .Marland Kitchen... 1 by mouth once daily  BP today: 146/72-- re check 122/70 at rest  Prior BP: 130/70 (07/23/2009)  Labs Reviewed: K+: 4.2 (07/17/2009) Creat: : 0.8 (07/17/2009)   Chol: 201 (10/22/2009)   HDL: 47.00 (10/22/2009)   LDL: 110 (12/21/2008)   TG: 215.0 (10/22/2009)  Problem # 2:  HYPERLIPIDEMIA (ICD-272.4) Assessment: Unchanged  did not start zetia yet- plans to start now  rev low sat fat diet - avoid red meat keep up exercise fasting lab 6 weeks f/u 6 mo  goal LDL under 70 Her updated medication list for this problem includes:    Simvastatin 80 Mg Tabs (Simvastatin) .Marland Kitchen... 1 by mouth once daily    Zetia 10 Mg Tabs (Ezetimibe) .Marland KitchenMarland KitchenMarland KitchenMarland Kitchen  1 by mouth once daily  Labs Reviewed: SGOT: 22 (10/22/2009)   SGPT: 29 (10/22/2009)   HDL:47.00 (10/22/2009),  47.20 (07/17/2009)  LDL:110 (12/21/2008), 73 (25/36/6440)  Chol:201 (10/22/2009), 189 (07/17/2009)  Trig:215.0 (10/22/2009), 324.0 (07/17/2009)  Problem # 3:  DIABETES MELLITUS, TYPE II (ICD-250.00) Assessment: Improved  improved AIC and sugars  urged to keep up good diet and exercise and wt loss  rev labs in detail today no change in med lab and f/u 6 mo  up to date opthy  good foot care  Her updated medication list for this problem includes:    Zestril 10 Mg Tabs (Lisinopril) .Marland Kitchen... 1 by mouth once daily    Metformin Hcl 500 Mg Tb24 (Metformin hcl) .Marland Kitchen... 1/2 tablet by mouth in am and 1 by mouth in pm    Aspirin 81 Mg Tabs (Aspirin) .Marland Kitchen... Take 1 tablet by mouth once a day  Labs Reviewed: Creat: 0.8 (07/17/2009)     Last Eye Exam: normal (10/08/2009) Reviewed HgBA1c results: 6.6 (10/22/2009)  6.9 (07/17/2009)  Complete Medication List: 1)  Synthroid 50 Mcg Tabs (Levothyroxine sodium) .... One by mouth daily 2)  Imipramine Hcl 25 Mg Tabs (Imipramine hcl) .... 2 by mouth q hs 3)  Ritalin La 10 Mg Cp24 (Methylphenidate hcl) .... One by mouth qid 4)  Zestril 10 Mg Tabs (Lisinopril) .Marland Kitchen.. 1 by mouth once daily 5)  Transderm-scop 1.5 Mg Pt72 (Scopolamine base) .... Use 1 patch as directed every 3 days as needed motion sickness 6)  Glucometer  .... To check glucose once daily and as needed for dm 250.0 7)  Glucose Test Strips and Lancets  .... To check glucose once daily and as needed for dm 250.0 8)  Metformin Hcl 500 Mg Tb24 (Metformin hcl) .... 1/2 tablet by mouth in am and 1 by mouth in pm 9)  Simvastatin 80 Mg Tabs (Simvastatin) .Marland Kitchen.. 1 by mouth once daily 10)  Zetia 10 Mg Tabs (Ezetimibe) .Marland Kitchen.. 1 by mouth once daily 11)  Aspirin 81 Mg Tabs (Aspirin) .... Take 1 tablet by mouth once a day 12)  Ibuprofen 200 Mg Tabs (Ibuprofen) .... Otc as directed. 13)  Mucinex Dm Maximum Strength 60-1200 Mg Xr12h-tab (Dextromethorphan-guaifenesin) .... Otc as directed.  Patient Instructions: 1)   add zetia one pill daily for cholesterol  2)  schedule fasting labs in 6 weeks lipid/ast/alt 272 3)  then fasting labs again in 6 months AIC / renal /lipid/ast/alt 250.0, 272 and then follow up  4)  keep working on healthy diet and exercise  Prescriptions: ZETIA 10 MG TABS (EZETIMIBE) 1 by mouth once daily  #30 x 11   Entered and Authorized by:   Judith Part MD   Signed by:   Judith Part MD on 10/31/2009   Method used:   Print then Give to Patient   RxID:   [id number]   Current Allergies (reviewed today): ! PENICILLIN

## 2010-07-11 NOTE — Progress Notes (Signed)
----   Converted from flag ---- ---- 05/31/2010 4:07 PM, Judith Part MD wrote: please check lipid/ hepatic/ renal / cbc with diff/ tsh and AIC for 244.9 and 272 and 401.1 and 250.0 thanks   ---- 05/30/2010 11:31 AM, Liane Comber CMA (AAMA) wrote:   ---- 05/30/2010 11:30 AM, Liane Comber CMA (AAMA) wrote: Lab orders please! Good Morning! This pt is scheduled for cpx labs [place], which labs to draw and dx codes to use? Thanks Tasha ------------------------------

## 2010-09-02 ENCOUNTER — Other Ambulatory Visit: Payer: Self-pay | Admitting: Otolaryngology

## 2010-09-02 DIAGNOSIS — H903 Sensorineural hearing loss, bilateral: Secondary | ICD-10-CM

## 2010-09-07 ENCOUNTER — Ambulatory Visit
Admission: RE | Admit: 2010-09-07 | Discharge: 2010-09-07 | Disposition: A | Discharge status: Home / Self Care | Payer: Medicare Other | Source: Ambulatory Visit | Attending: Otolaryngology | Admitting: Otolaryngology

## 2010-09-07 DIAGNOSIS — H903 Sensorineural hearing loss, bilateral: Secondary | ICD-10-CM

## 2010-09-25 ENCOUNTER — Encounter: Payer: Self-pay | Admitting: Family Medicine

## 2010-09-26 ENCOUNTER — Ambulatory Visit (INDEPENDENT_AMBULATORY_CARE_PROVIDER_SITE_OTHER): Payer: Medicare Other | Admitting: Family Medicine

## 2010-09-26 ENCOUNTER — Encounter: Payer: Self-pay | Admitting: Family Medicine

## 2010-09-26 VITALS — BP 142/74 | HR 84 | Temp 98.4°F | Wt 168.0 lb

## 2010-09-26 DIAGNOSIS — J019 Acute sinusitis, unspecified: Secondary | ICD-10-CM

## 2010-09-26 MED ORDER — AZITHROMYCIN 250 MG PO TABS: ORAL_TABLET | ORAL | Status: AC

## 2010-09-26 NOTE — Patient Instructions (Signed)
Drink plenty of fluids, take tylenol as needed, and this should gradually improve.  Take care.  Let us know if you have other concerns.  I would start the antibiotics next week if you aren't improved.

## 2010-09-26 NOTE — Progress Notes (Signed)
duration of symptoms: since Tuesday, started with some nausea and fatigue, has been resting in bed last few days Rhinorrhea: yes and sneezing congestion:yes ear pain:no sore throat:yes Cough:yes myalgias:no other concerns: also with HA.  "I just don't feel good." Sugar has been controlled.  No fevers.  No sweats. Not sob.  Took zyrtec w/o relief.     ROS: See HPI.  Otherwise negative.    Meds, vitals, and allergies reviewed.   GEN: nad, alert and oriented HEENT: mucous membranes moist, TM w/o erythema, nasal epithelium injected, OP with cobblestoning, max sinus slightly ttp b NECK: supple w/o LA CV: rrr. PULM: ctab, no inc wob ABD: soft, +bs EXT: no edema

## 2010-09-26 NOTE — Assessment & Plan Note (Signed)
Nontoxic, this may self resolve.  Note for work given.  Hold abx in meantime and start if not improved.  She agrees.  Fu prn.  Supportive measures o/w.

## 2010-10-23 ENCOUNTER — Other Ambulatory Visit: Payer: Self-pay | Admitting: *Deleted

## 2010-10-23 MED ORDER — EZETIMIBE 10 MG PO TABS: 10.0000 mg | ORAL_TABLET | Freq: Every day | ORAL | Status: DC

## 2010-10-25 NOTE — H&P (Signed)
[place]. [facility]  Patient:    NEEYA, PRIGMORE Visit Number: [id number] MRN: [id number]          Service Type: MED Location: [phone] Attending Physician:  McDiarmid, Leighton Roach. Dictated by:   Maryelizabeth Rowan, M.D. Admit Date:  08/11/2001 Discharge Date: 08/13/2001   CC:         Eula Listen, M.D.  Catherine A. Orlin Hilding, M.D.   History and Physical  PRIMARY Asriel Westrup: [facility], Eula Listen, M.D.  NEUROLOGIST: Gustavus Messing. Orlin Hilding, M.D.  CHIEF COMPLAINT: Shortness of breath.  HISTORY OF PRESENT ILLNESS: This 70 year old white female complains of shortness of breath and dyspnea on exertion for approximately one week.  She states that her symptoms have been worsening for the last two days and that she was unable to sleep last night due to her shortness of breath.  She also complains of aching all over for one week and pain in her left lower rib cage. She states that she was seen by an M.D. (not her primary) approximately a week ago and diagnosed with the flu.  Even though she denies any diagnosis of pneumonia she was given a Z-Pak to take.  The patient completed this azithromycin therapy on August 10, 2001 and is still complaining of increasing shortness of breath, productive cough which is worse at night.  PAST MEDICAL HISTORY:  1. Narcolepsy.  2. History of pneumonia in 2000.  3. History of sinus infections.  PAST SURGICAL HISTORY:  1. Cervical spine surgery.  2. Hysterectomy.  3. Appendectomy.  MEDICATIONS:  1. Ibuprofen p.r.n. headache.  2. Ritalin 10 mg q.i.d.  3. Tofranil 25 mg two q.h.s.  SOCIAL HISTORY: Lives with husband.  Employed at [employer].  Has three grown children, three granddaughters, and recent travel to the [place] in August 2002.  Tobacco use, history of 20-pack-year smoking, quit 20 years ago.  Alcohol, occasional wine.  Drug use, denies.  IMMUNIZATIONS: Recently immunized  with flu shot.  No Streptococcal vaccination.  PSYCHIATRIC HISTORY: Denies any depression or anxiety.  FAMILY HISTORY: Mother with MI, CABG and bladder cancer.  Brother with diabetes, type 2.  Father with hypertension.  ALLERGIES: PENICILLIN produces rash and hives.  REVIEW OF SYSTEMS: CONSTITUTIONAL: No fever, chills, night sweats.  HEENT: Positive mild rhinorrhea.  Positive headaches.  RESPIRATORY: Productive cough as above.  Shortness of breath and dyspnea on exertion.  CARDIAC: No chest pain, palpitations.  CHEST: Positive pain in left rib cage, worsening with inspiration.  GI: No nausea, vomiting, diarrhea.  No change in bowel habits, blood in stool, or black, tarry stool.  MUSCULOSKELETAL: No back pain, joint pain.  Positive diffuse myalgia.  GU: No dysuria or hematuria.  ENDOCRINE: No increased thirst, polyuria.  HEMATOLOGIC: No easy bruisability.  NEUROLOGIC: No weakness, ataxia, numbness, or tingling.  PHYSICAL EXAMINATION:  VITAL SIGNS: TEMP 98.7 degrees, blood pressure 152/70, pulse 80, respirations 20.  Saturation 91% on room air.  GENERAL: NAD.  Oriented x3.  HEENT: PERRLA.  EOMI.  Conjunctivae clear.  Sclerae anicteric.  Oropharynx nonerythematous, nonedematous.  Mucosa pink, dry, cracking.  NECK: Supple.  No LA/TM or JVD.  RESPIRATORY: Good effort.  Rhonchi in left lung fields.  No increased I:E ratio.  CARDIAC: RRR.  No MRG.  ABDOMEN: Positive BS, soft, NT/ND.  No HSM.  Obese.  EXTREMITIES: No CCE.  NEUROLOGIC: Cranial nerves II-XII intact.  Strength 5/5 and equal bilaterally. Reflexes 2/4 and equal bilaterally.  LABORATORY DATA: WBC  14.6, hemoglobin 12.3, platelets 284,000.  BMP was within normal limits.  Calcium low at 7.9, protein 6.4, and albumin very low at 2.2.  X-ray confirmed left lower lobe infiltrate.  ASSESSMENT/PLAN:  1. Pneumonia, left lower lobe.  Currently failed five day therapy of     azithromycin as outpatient.  Will start  intravenous Tequin today and admit     to a regular bed.  2. Hypoxia.  This is likely secondary to her pneumonia.  Will give supportive     oxygen therapy throughout the night and wean as tolerated in a.m.  3. Dehydration.  Patient appears clinically dehydrated.  Will give bolus of     intravenous one liter normal saline in the emergency room and then run     maintenance of this for 12 hours, and encourage p.o. rehydration.  4. Narcolepsy.  Will resume home medications as recorded above. Dictated by:   Maryelizabeth Rowan, M.D. Attending Physician:  McDiarmid, Tawanna Cooler D. DD:  08/12/01 TD:  08/13/01 Job: [id number]

## 2010-10-25 NOTE — Discharge Summary (Signed)
[place]. [facility]  Patient:    Amanda King, MESTER Visit Number: [id number] MRN: [id number]          Service Type: MED Location: [phone] Attending Physician:  McDiarmid, Leighton Roach. Dictated by:   Vear Clock, M.D. Admit Date:  08/11/2001 Discharge Date: 08/13/2001   CC:         [facility]   Discharge Summary  DISCHARGE DIAGNOSES: 1. Left lower lobe pneumonia. 2. Hypoxia, resolved. 3. Dehydration, resolved. 4. Narcolepsy.  DISCHARGE MEDICATIONS: 1. Tequin 400 mg p.o. q.d. through August 17, 2001. 2. Ritalin 10 mg p.o. b.i.d. 3. Tofranil 25 mg p.o. q.h.s.  FOLLOWUP:  The patient is to follow up at [facility] after completion of her antibiotics in approximately one week.  She will also need a repeat chest x-ray in approximately six weeks to ensure resolution of the pneumonia.  PROCEDURES/DIAGNOSTIC STUDIES:  Chest x-ray on August 12, 2001, shows left perihilar and left lower lobe pneumonia.  CONSULTATIONS:  None.  HISTORY OF PRESENT ILLNESS:  Please see dictated H&P for further details.  In short, this is a 70 year old, white female with history of previous pneumonia who was admitted with a one-week history of myalgias, cough and progressive shortness of breath.  Approximately six days ago, she had been diagnosed with the flu and treated with a Z-pack and had not had improvement.  The patient became concerned secondary to inability to walk secondary to shortness of breath.  She presented to [facility] and was found to have pneumonia and hypoxemia and sent for admission.  HOSPITAL COURSE:  #1 - PNEUMONIA:  The patient was treated with one day of IV Tequin overnight and did well.  By the day after admission, the patient was no longer requiring oxygen and was much more comfortable.  She was afebrile and was changed to p.o. Tequin.  The patient continued to do well and was discharged on hospital day #2.  The  patient will need followup chest x-ray to ensure resolution of disease process in approximately six weeks.  #2 - HYPOXIA:  This was secondary to #1.  #3 - DEHYDRATION:  The patient was rehydrated overnight secondary to inability to take p.o.  IV was Hep-locked on day #1 without difficulty and the patient was discharged to home tolerating a regular diet on hospital day #2.  #4 - NARCOLEPSY:  The patient has a history of narcolepsy for which she takes Ritalin and Tofranil.  This was not an issue during this hospitalization.  DISCHARGE LABORATORY DATA AND X-RAY FINDINGS:  BMET on August 12, 2001, showed sodium 138, potassium 3.7, chloride 107, CO2 25, glucose 103, BUN 21, creatinine 0.9, calcium 7.6.  CBC on August 12, 2001, shows WBC 14.1, which is down from 14.6 on admission, hemoglobin 11.1, which is down from 12.3 on admission, platelets 317.  Blood cultures x2 are negative to date and should be followed up as an outpatient.  Total bilirubin on admission 1.3, Alk phos 172, SGOT 50, SGPT 38, total protein 6.4, albumin 2.2, calcium 7.9.  DISPOSITION:  Discharged to home on March 7, without further incident. Dictated by:   Vear Clock, M.D. Attending Physician:  McDiarmid, Tawanna Cooler D. DD:  08/13/01 TD:  08/16/01 Job: [id number]

## 2011-01-08 ENCOUNTER — Telehealth: Payer: Self-pay | Admitting: *Deleted

## 2011-01-08 MED ORDER — SCOPOLAMINE 1 MG/3DAYS TD PT72: 1.0000 | MEDICATED_PATCH | TRANSDERMAL | Status: DC

## 2011-01-08 NOTE — Telephone Encounter (Signed)
Left vm for pt to callback 

## 2011-01-08 NOTE — Telephone Encounter (Signed)
Pt is leaving for a cruise on Saturday and is asking for motion sickness patches.  She will be gone for 6 days.  [facility]ses [facility].

## 2011-01-08 NOTE — Telephone Encounter (Signed)
Will send electronically- please let her know  Watch out for sedation I think she has used it before - scopalamine patch

## 2011-01-09 NOTE — Telephone Encounter (Signed)
Patient notified as instructed by telephone. 

## 2011-03-25 ENCOUNTER — Other Ambulatory Visit: Payer: Self-pay

## 2011-03-25 MED ORDER — LEVOTHYROXINE SODIUM 50 MCG PO TABS: 50.0000 ug | ORAL_TABLET | Freq: Every day | ORAL | Status: DC

## 2011-03-25 NOTE — Telephone Encounter (Signed)
[facility] faxed refill request Synthroid 50 mcg #30 x 2 with note pt needs to call for appt.

## 2011-05-05 ENCOUNTER — Other Ambulatory Visit: Payer: Self-pay

## 2011-05-05 MED ORDER — METFORMIN HCL 500 MG PO TABS: ORAL_TABLET | ORAL | Status: DC

## 2011-05-05 NOTE — Telephone Encounter (Signed)
[facility] faxed refill request Metformin 500 mg #45 x1 with note pt needs to call for appt.

## 2011-05-20 ENCOUNTER — Other Ambulatory Visit: Payer: Self-pay

## 2011-05-20 MED ORDER — LISINOPRIL 10 MG PO TABS: 10.0000 mg | ORAL_TABLET | Freq: Every day | ORAL | Status: DC

## 2011-05-20 NOTE — Telephone Encounter (Signed)
Thanks for the heads up - please schedule f/u  Will refill electronically

## 2011-05-20 NOTE — Telephone Encounter (Signed)
[facility] faxed refill request Lisinopril 10 mg. Pt last seen acute visit for sinusitis by Dr Para March on 09/26/10 and BP was 142/74. Last 6 mth f/u visit by Dr Milinda Antis 05/06/10. Pt does not have scheduled appt. Please advise.

## 2011-05-22 NOTE — Telephone Encounter (Signed)
Patient notified as instructed by telephone. Pt scheduled f/u 06/30/10 at 9am with Dr Milinda Antis pt will be fasting.

## 2011-06-05 ENCOUNTER — Other Ambulatory Visit: Payer: Self-pay | Admitting: *Deleted

## 2011-06-05 MED ORDER — SIMVASTATIN 80 MG PO TABS: 80.0000 mg | ORAL_TABLET | Freq: Every day | ORAL | Status: DC

## 2011-06-05 MED ORDER — EZETIMIBE 10 MG PO TABS: 10.0000 mg | ORAL_TABLET | Freq: Every day | ORAL | Status: DC

## 2011-06-25 ENCOUNTER — Other Ambulatory Visit: Payer: Self-pay | Admitting: Internal Medicine

## 2011-06-25 MED ORDER — LEVOTHYROXINE SODIUM 50 MCG PO TABS: 50.0000 ug | ORAL_TABLET | Freq: Every day | ORAL | Status: DC

## 2011-06-25 NOTE — Telephone Encounter (Signed)
Rx sent to pharmacy   

## 2011-07-01 ENCOUNTER — Encounter: Payer: Self-pay | Admitting: Family Medicine

## 2011-07-01 ENCOUNTER — Ambulatory Visit (INDEPENDENT_AMBULATORY_CARE_PROVIDER_SITE_OTHER): Payer: Medicare Other | Admitting: Family Medicine

## 2011-07-01 VITALS — BP 140/70 | HR 96 | Temp 98.1°F | Ht 61.5 in | Wt 165.2 lb

## 2011-07-01 DIAGNOSIS — E663 Overweight: Secondary | ICD-10-CM | POA: Insufficient documentation

## 2011-07-01 DIAGNOSIS — E119 Type 2 diabetes mellitus without complications: Secondary | ICD-10-CM

## 2011-07-01 DIAGNOSIS — E669 Obesity, unspecified: Secondary | ICD-10-CM

## 2011-07-01 DIAGNOSIS — I10 Essential (primary) hypertension: Secondary | ICD-10-CM

## 2011-07-01 DIAGNOSIS — E785 Hyperlipidemia, unspecified: Secondary | ICD-10-CM

## 2011-07-01 DIAGNOSIS — Z23 Encounter for immunization: Secondary | ICD-10-CM

## 2011-07-01 DIAGNOSIS — E039 Hypothyroidism, unspecified: Secondary | ICD-10-CM

## 2011-07-01 LAB — CBC WITH DIFFERENTIAL/PLATELET
Basophils Absolute: 0 10*3/uL (ref 0.0–0.1)
Eosinophils Absolute: 0.1 10*3/uL (ref 0.0–0.7)
HCT: 39.8 % (ref 36.0–46.0)
Lymphs Abs: 2.2 10*3/uL (ref 0.7–4.0)
MCHC: 33.8 g/dL (ref 30.0–36.0)
MCV: 91.1 fl (ref 78.0–100.0)
Monocytes Absolute: 0.5 10*3/uL (ref 0.1–1.0)
Platelets: 241 10*3/uL (ref 150.0–400.0)
RDW: 13.4 % (ref 11.5–14.6)

## 2011-07-01 LAB — LIPID PANEL
HDL: 48.7 mg/dL (ref >39.00)
Total CHOL/HDL Ratio: 3
Triglycerides: 157 mg/dL — ABNORMAL HIGH (ref 0.0–149.0)

## 2011-07-01 LAB — COMPREHENSIVE METABOLIC PANEL
ALT: 44 U/L — ABNORMAL HIGH (ref 0–35)
Alkaline Phosphatase: 99 U/L (ref 39–117)
Glucose, Bld: 144 mg/dL — ABNORMAL HIGH (ref 70–99)
Sodium: 139 mEq/L (ref 135–145)
Total Bilirubin: 0.3 mg/dL (ref 0.3–1.2)
Total Protein: 7.5 g/dL (ref 6.0–8.3)

## 2011-07-01 LAB — TSH: TSH: 7.59 u[IU]/mL — ABNORMAL HIGH (ref 0.35–5.50)

## 2011-07-01 MED ORDER — LEVOTHYROXINE SODIUM 50 MCG PO TABS: 50.0000 ug | ORAL_TABLET | Freq: Every day | ORAL | Status: DC

## 2011-07-01 MED ORDER — METFORMIN HCL 500 MG PO TABS: ORAL_TABLET | ORAL | Status: DC

## 2011-07-01 MED ORDER — SIMVASTATIN 80 MG PO TABS: 80.0000 mg | ORAL_TABLET | Freq: Every day | ORAL | Status: DC

## 2011-07-01 MED ORDER — LISINOPRIL 10 MG PO TABS: 10.0000 mg | ORAL_TABLET | Freq: Every day | ORAL | Status: DC

## 2011-07-01 MED ORDER — EZETIMIBE 10 MG PO TABS: 10.0000 mg | ORAL_TABLET | Freq: Every day | ORAL | Status: DC

## 2011-07-01 NOTE — Patient Instructions (Addendum)
Keep working on [employer]- cut portions for weight loss Also low fat and low sugar diet  Try to work on adding exercise 5 days per week  Labs today  Follow up for annual exam in 6 months with labs prior  tetnus shot today

## 2011-07-01 NOTE — Assessment & Plan Note (Signed)
bp in fair control at this time  No changes needed  Disc lifstyle change with low sodium diet and exercise   Better on 2nd check- will continue to check at home

## 2011-07-01 NOTE — Assessment & Plan Note (Signed)
On high dose zocor (will continue this because she has no side eff at all) Also zetia Rev low sat fat diet Lab today F/u 6 mo

## 2011-07-01 NOTE — Progress Notes (Signed)
Subjective:    Patient ID: Amanda King, female    DOB: [birth year], 71 y.o.   MRN: [id number]  HPI Here for f/u of hypothyroid and DM2 and lipids and HTN and obesity-- needs med refils Is overdue for visit and labs Has been feeling ok -- just tired- working a lot of hours -- that will not last much longer  Does try to take care of herself   Wt is down 3 lb with bmi of 30 Is obese Exercise Diet- is eating healthy- overall watches sugar and fats  Her grandsons moved in for her and they do the cooking - bake and grill everything  Lipids - is on zetia and zocor  Lab Results  Component Value Date   CHOL 149 06/06/2010   CHOL 286* 04/29/2010   CHOL 161 12/13/2009   Lab Results  Component Value Date   HDL 37.80* 06/06/2010   HDL 48.10 04/29/2010   HDL 45.60 12/13/2009   Lab Results  Component Value Date   LDLCALC 110* 12/21/2008   LDLCALC 73 03/02/2008   LDLCALC 78 01/14/2007   Lab Results  Component Value Date   TRIG 274.0* 06/06/2010   TRIG 292.0* 04/29/2010   TRIG 209.0* 12/13/2009   Lab Results  Component Value Date   CHOLHDL 4 06/06/2010   CHOLHDL 6 04/29/2010   CHOLHDL 4 12/13/2009   Lab Results  Component Value Date   LDLDIRECT 79.7 06/06/2010   LDLDIRECT 172.3 04/29/2010   LDLDIRECT 86.4 12/13/2009       Hypothyroid Lab Results  Component Value Date   TSH 4.69 06/06/2010   Clinically- feels about the same No skin or hair changes   bp is 156/78    Today-- this is up- took her medicine today  Does not think her systolic is above 140 at home  Some stress - brother died 2 mo ago - getting over that  No cp or palpitations or headaches or edema  No side effects to medicines    DM2 Last a1c 7.2 over  A year ago  On metformin and ace Does check her sugars at home -- tries to check every day -- in the ams higher 140 , then evenings lower about 120 opthy- less than a year ago  Symptoms- no excessive thirst / urination/ vision problems or numbness   Not walking  like she used to - a lot of stress and extra work   Patient Active Problem List  Diagnoses  . DERMATOPHYTOSIS OF NAIL  . HYPOTHYROIDISM  . DIABETES MELLITUS, TYPE II  . HYPERLIPIDEMIA  . ANXIETY  . NARCOLEPSY W/CATAPLEXY  . HYPERTENSION  . PNEUMONIA, HX OF  . Obesity   Past Medical History  Diagnosis Date  . Diabetes mellitus     Type II  . Hyperlipidemia   . Hypertension   . Hypothyroidism   . Pneumonia 08/2001   Past Surgical History  Procedure Date  . Appendectomy age 73  . Partial hysterectomy age 5  . Posterior cervical fusion/foraminotomy 02/05/1975    C5-C6-C7  . Hemilaminectomy     C5-6; C6-7   History  Substance Use Topics  . Smoking status: Former Smoker    Quit date: 06/09/1980  . Smokeless tobacco: Not on file  . Alcohol Use: Not on file   Family History  Problem Relation Age of Onset  . Heart disease Mother     MI  . Peripheral vascular disease Mother   . Hyperlipidemia Mother   . Depression  Brother     suicide  . Diabetes Brother   . Diabetes Brother    Allergies  Allergen Reactions  . Penicillins     REACTION: GI upset   Current Outpatient Prescriptions on File Prior to Visit  Medication Sig Dispense Refill  . aspirin 81 MG tablet Take 81 mg by mouth daily.        . Blood Glucose Monitoring Suppl (BLOOD GLUCOSE MONITOR KIT) KIT Check blood sugar once daily as needed for DM       . glucose blood test strip Check blood sugar once daily as needed for DM.       Marland Kitchen ibuprofen (ADVIL,MOTRIN) 200 MG tablet Take 200 mg by mouth every 6 (six) hours as needed.        Marland Kitchen imipramine (TOFRANIL) 25 MG tablet Take 2 tablets by mouth at bedtime       . methylphenidate (RITALIN LA) 10 MG 24 hr capsule Take 10 mg by mouth 4 (four) times daily.        Marland Kitchen Dextromethorphan-Guaifenesin (MUCINEX DM MAXIMUM STRENGTH) 60-1200 MG per 12 hr tablet Take 1 tablet by mouth every 12 (twelve) hours as needed.        Marland Kitchen scopolamine (TRANSDERM-SCOP) 1.5 MG Place 1 patch (1.5 mg  total) onto the skin every 3 (three) days.  5 patch  0        Review of Systems Review of Systems  Constitutional: Negative for fever, appetite change,  and unexpected weight change. pos for fatigue from busy schedule  Eyes: Negative for pain and visual disturbance.  Respiratory: Negative for cough and shortness of breath.   Cardiovascular: Negative for cp or palpitations    Gastrointestinal: Negative for nausea, diarrhea and constipation.  Genitourinary: Negative for urgency and frequency. no excessive thirst  Skin: Negative for pallor or rash   Neurological: Negative for weakness, light-headedness, numbness and headaches.  Hematological: Negative for adenopathy. Does not bruise/bleed easily.  Psychiatric/Behavioral: Negative for dysphoric mood. The patient is not nervous/anxious.          Objective:   Physical Exam  Constitutional: She appears well-developed and well-nourished. No distress.       overwt and well appearing   HENT:  Head: Normocephalic and atraumatic.  Mouth/Throat: Oropharynx is clear and moist.  Eyes: EOM are normal. Pupils are equal, round, and reactive to light. No scleral icterus.  Neck: Normal range of motion. Neck supple. No JVD present. Carotid bruit is not present. No thyromegaly present.  Cardiovascular: Normal rate, regular rhythm, normal heart sounds and intact distal pulses.  Exam reveals no gallop.   Pulmonary/Chest: Effort normal and breath sounds normal. No respiratory distress. She has no wheezes.  Abdominal: Soft. Bowel sounds are normal. She exhibits no distension, no abdominal bruit and no mass. There is no tenderness.  Musculoskeletal: Normal range of motion. She exhibits no edema.  Lymphadenopathy:    She has no cervical adenopathy.  Neurological: She is alert. She has normal reflexes. No cranial nerve deficit. She exhibits normal muscle tone. Coordination normal.  Skin: Skin is warm and dry. No rash noted. No erythema. No pallor.    Psychiatric: She has a normal mood and affect.          Assessment & Plan:

## 2011-07-01 NOTE — Assessment & Plan Note (Signed)
Disc how wt affects all other health problems Plan to loose with dec calories and walking 5 d per wk

## 2011-07-01 NOTE — Assessment & Plan Note (Signed)
Per pt fairly stable on metformin  Higher sugars in am a1c today F/u 6 mo  Change med as needed Ref low glycemic diet Needs more exercise utd opthy On ace

## 2011-07-01 NOTE — Assessment & Plan Note (Signed)
tsh today Has been stable No clinical changes

## 2011-08-03 ENCOUNTER — Other Ambulatory Visit: Payer: Self-pay | Admitting: Family Medicine

## 2011-09-07 ENCOUNTER — Other Ambulatory Visit: Payer: Self-pay | Admitting: Family Medicine

## 2011-11-14 ENCOUNTER — Encounter: Payer: Self-pay | Admitting: Family Medicine

## 2011-11-14 ENCOUNTER — Ambulatory Visit (INDEPENDENT_AMBULATORY_CARE_PROVIDER_SITE_OTHER): Payer: Medicare Other | Admitting: Family Medicine

## 2011-11-14 VITALS — BP 126/72 | HR 94 | Temp 98.1°F | Ht 61.25 in | Wt 160.8 lb

## 2011-11-14 DIAGNOSIS — M545 Low back pain, unspecified: Secondary | ICD-10-CM

## 2011-11-14 DIAGNOSIS — N390 Urinary tract infection, site not specified: Secondary | ICD-10-CM

## 2011-11-14 LAB — POCT URINALYSIS DIPSTICK
Nitrite, UA: NEGATIVE
Urobilinogen, UA: 0.2
pH, UA: 5

## 2011-11-14 LAB — POCT UA - MICROSCOPIC ONLY

## 2011-11-14 MED ORDER — CIPROFLOXACIN HCL 500 MG PO TABS: 500.0000 mg | ORAL_TABLET | Freq: Two times a day (BID) | ORAL | Status: AC

## 2011-11-14 NOTE — Assessment & Plan Note (Signed)
With L flank and back pain  ua with bacteria and leukocytes Sent urine for cx  tx with cipro 500 for 5  D until culture returns Update if not starting to improve in several  or if worsening   Urged to drink more water

## 2011-11-14 NOTE — Patient Instructions (Signed)
I think you have a urinary infection causing your pain Take the cipro 500 mg twice daily for 5 days and drink lots of water  We will update you when the urine culture comes back  In the meantime , if you get worse, call and let us know - especially if any fever or vomiting

## 2011-11-14 NOTE — Progress Notes (Signed)
Subjective:    Patient ID: Amanda King, female    DOB: [birth year], 71 y.o.   MRN: [id number]  HPI Started with L low back pain on Wednesday  No change in activity Thought it was muscular  Then yesterday started to hurt around the side and it is sore  Rad to lower abdomen  Has been lying on back with feet up and pillows under knees  Took some ibuprofen - thought it helped a little   No burning on urination or blood or frequency  No fever   No vomiting but does have nausea   Ate breakfast this am   No change in BM  Never had a kidney stone in the past  Patient Active Problem List  Diagnoses  . DERMATOPHYTOSIS OF NAIL  . HYPOTHYROIDISM  . DIABETES MELLITUS, TYPE II  . HYPERLIPIDEMIA  . ANXIETY  . NARCOLEPSY W/CATAPLEXY  . HYPERTENSION  . PNEUMONIA, HX OF  . Obesity   Past Medical History  Diagnosis Date  . Diabetes mellitus     Type II  . Hyperlipidemia   . Hypertension   . Hypothyroidism   . Pneumonia 08/2001   Past Surgical History  Procedure Date  . Appendectomy age 60  . Partial hysterectomy age 44  . Posterior cervical fusion/foraminotomy 02/05/1975    C5-C6-C7  . Hemilaminectomy     C5-6; C6-7   History  Substance Use Topics  . Smoking status: Former Smoker    Quit date: 06/09/1980  . Smokeless tobacco: Not on file  . Alcohol Use: Not on file   Family History  Problem Relation Age of Onset  . Heart disease Mother     MI  . Peripheral vascular disease Mother   . Hyperlipidemia Mother   . Depression Brother     suicide  . Diabetes Brother   . Diabetes Brother    Allergies  Allergen Reactions  . Penicillins     REACTION: GI upset   Current Outpatient Prescriptions on File Prior to Visit  Medication Sig Dispense Refill  . aspirin 81 MG tablet Take 81 mg by mouth daily.        . Blood Glucose Monitoring Suppl (BLOOD GLUCOSE MONITOR KIT) KIT Check blood sugar once daily as needed for DM       . Dextromethorphan-Guaifenesin (MUCINEX DM  MAXIMUM STRENGTH) 60-1200 MG per 12 hr tablet Take 1 tablet by mouth every 12 (twelve) hours as needed.        . ezetimibe (ZETIA) 10 MG tablet Take 1 tablet (10 mg total) by mouth daily.  30 tablet  11  . glucose blood test strip Check blood sugar once daily as needed for DM.       Marland Kitchen ibuprofen (ADVIL,MOTRIN) 200 MG tablet Take 200 mg by mouth every 6 (six) hours as needed.        Marland Kitchen imipramine (TOFRANIL) 25 MG tablet Take 2 tablets by mouth at bedtime       . lisinopril (PRINIVIL,ZESTRIL) 10 MG tablet Take 1 tablet (10 mg total) by mouth daily.  30 tablet  11  . metFORMIN (GLUCOPHAGE) 500 MG tablet Take 1/2 tablet in the morning and 1 tablet by mouth in the evening.  45 tablet  11  . methylphenidate (RITALIN LA) 10 MG 24 hr capsule Take 10 mg by mouth 4 (four) times daily.        . simvastatin (ZOCOR) 80 MG tablet Take 1 tablet (80 mg total) by mouth at bedtime.  30 tablet  11  . DISCONTD: levothyroxine (SYNTHROID, LEVOTHROID) 50 MCG tablet Take 1 tablet (50 mcg total) by mouth daily.  30 tablet  11  . DISCONTD: SYNTHROID 50 MCG tablet TAKE 1 TABLET (50 MCG TOTAL) BY MOUTH DAILY.  30 tablet  6       Review of Systems Review of Systems  Constitutional: Negative for fever, appetite change, fatigue and unexpected weight change.  Eyes: Negative for pain and visual disturbance.  Respiratory: Negative for cough and shortness of breath.   Cardiovascular: Negative for cp or palpitations    Gastrointestinal: Negative for nausea, diarrhea and constipation. pos for mild nausea  Genitourinary: Negative for urgency and frequency. neg for blood in urine / pos for flank pain  Skin: Negative for pallor or rash   Neurological: Negative for weakness, light-headedness, numbness and headaches.  Hematological: Negative for adenopathy. Does not bruise/bleed easily.  Psychiatric/Behavioral: Negative for dysphoric mood. The patient is not nervous/anxious.         Objective:   Physical Exam  Constitutional:  She appears well-developed. No distress.       overwt and well appearing   HENT:  Head: Normocephalic and atraumatic.  Mouth/Throat: Oropharynx is clear and moist.  Eyes: Conjunctivae and EOM are normal. Pupils are equal, round, and reactive to light. No scleral icterus.  Neck: Normal range of motion. Neck supple. No thyromegaly present.  Cardiovascular: Normal rate and regular rhythm.   Pulmonary/Chest: Effort normal and breath sounds normal. No respiratory distress. She has no wheezes. She has no rales.  Abdominal: Soft. Bowel sounds are normal. She exhibits no distension and no mass. There is tenderness. There is no rebound and no guarding.       Tender over L flank and L lower abdomen and suprapubic area  Mild , no rebound/ guarding or rigidity  Musculoskeletal: She exhibits no edema.       Mild L cva tenderness  Lymphadenopathy:    She has no cervical adenopathy.  Neurological: She is alert. She has normal reflexes.  Skin: Skin is warm and dry. No rash noted. No erythema. No pallor.  Psychiatric: She has a normal mood and affect.          Assessment & Plan:

## 2011-11-16 LAB — URINE CULTURE

## 2011-11-18 ENCOUNTER — Telehealth: Payer: Self-pay | Admitting: *Deleted

## 2011-11-18 NOTE — Telephone Encounter (Signed)
Message copied by Regis Bill on Tue Nov 18, 2011  5:48 PM ------      Message from: Roxy Manns A      Created: Sun Nov 16, 2011 10:17 PM       Urine cx was indeterminate      Please let me know if symptoms are not improved

## 2011-11-18 NOTE — Telephone Encounter (Signed)
Informed patient; symptoms are improved, today was last dosage of ABX; Pt c/o still having back pain & left flank pain. Would like to inquire as to if she needs further ABX treatment for this issue?/SLS

## 2011-11-18 NOTE — Telephone Encounter (Signed)
I want her to follow up for the continued pain - not sure if all of it is urinary or not  (? Musculoskeletal) Thanks

## 2011-11-19 NOTE — Telephone Encounter (Signed)
[facility] w/contact name & number for patient to return call to schedule F/U OV to discuss matter, per Vo MAT/SLS

## 2011-11-24 ENCOUNTER — Encounter: Payer: Self-pay | Admitting: Family Medicine

## 2011-11-24 ENCOUNTER — Ambulatory Visit (INDEPENDENT_AMBULATORY_CARE_PROVIDER_SITE_OTHER): Payer: Medicare Other | Admitting: Family Medicine

## 2011-11-24 VITALS — BP 146/68 | HR 100 | Temp 98.0°F | Ht 61.25 in | Wt 161.8 lb

## 2011-11-24 DIAGNOSIS — R8290 Unspecified abnormal findings in urine: Secondary | ICD-10-CM

## 2011-11-24 DIAGNOSIS — N390 Urinary tract infection, site not specified: Secondary | ICD-10-CM

## 2011-11-24 DIAGNOSIS — R109 Unspecified abdominal pain: Secondary | ICD-10-CM

## 2011-11-24 DIAGNOSIS — R82998 Other abnormal findings in urine: Secondary | ICD-10-CM

## 2011-11-24 DIAGNOSIS — M545 Low back pain, unspecified: Secondary | ICD-10-CM

## 2011-11-24 LAB — POCT URINALYSIS DIPSTICK: Spec Grav, UA: 1.025

## 2011-11-24 LAB — POCT UA - MICROSCOPIC ONLY

## 2011-11-24 MED ORDER — NITROFURANTOIN MONOHYD MACRO 100 MG PO CAPS: 100.0000 mg | ORAL_CAPSULE | Freq: Two times a day (BID) | ORAL | Status: AC

## 2011-11-24 NOTE — Patient Instructions (Addendum)
Take the macrobid 100 mg twice daily  We will update you when culture returns If your symptoms worsen in the mean time let me know  If culture is negative - we will work this up further with some radiology studies

## 2011-11-24 NOTE — Assessment & Plan Note (Signed)
ua is still pos today- despite contam cx Will tx with macrobid Repeat cx If not pos-would consider eval for kidney stone or LS pain with radiculopathy Pt voiced understanding Update if not starting to improve in a week or if worsening

## 2011-11-24 NOTE — Assessment & Plan Note (Signed)
Per pt now worse in flank and abd Repeat urine cx tx for uti  If neg consider LS films/ CT for poss stone

## 2011-11-24 NOTE — Progress Notes (Signed)
Subjective:    Patient ID: Amanda King, female    DOB: [birth year], 71 y.o.   MRN: [id number]  HPI Here for f/u of L low back pain Last visit her ua pos but ucx came back ? Contaminated/ multiple bact Took full course of high dose cipro  Still c/o of left flank pain rad to abd and back Eased up with antibiotics and then happened again  Started in back and moved to abd- now mostly in abd  No urine symptoms Drinking lots of water - urinating more frequently due to that No burning or fever or blood in urine  Never had kidney stones   Patient Active Problem List  Diagnosis  . DERMATOPHYTOSIS OF NAIL  . HYPOTHYROIDISM  . DIABETES MELLITUS, TYPE II  . HYPERLIPIDEMIA  . ANXIETY  . NARCOLEPSY W/CATAPLEXY  . HYPERTENSION  . PNEUMONIA, HX OF  . Obesity  . Complicated UTI (urinary tract infection)  . Left low back pain   Past Medical History  Diagnosis Date  . Diabetes mellitus     Type II  . Hyperlipidemia   . Hypertension   . Hypothyroidism   . Pneumonia 08/2001   Past Surgical History  Procedure Date  . Appendectomy age 89  . Partial hysterectomy age 76  . Posterior cervical fusion/foraminotomy 02/05/1975    C5-C6-C7  . Hemilaminectomy     C5-6; C6-7   History  Substance Use Topics  . Smoking status: Former Smoker    Quit date: 06/09/1980  . Smokeless tobacco: Not on file  . Alcohol Use: Not on file   Family History  Problem Relation Age of Onset  . Heart disease Mother     MI  . Peripheral vascular disease Mother   . Hyperlipidemia Mother   . Depression Brother     suicide  . Diabetes Brother   . Diabetes Brother    Allergies  Allergen Reactions  . Penicillins     REACTION: GI upset   Current Outpatient Prescriptions on File Prior to Visit  Medication Sig Dispense Refill  . aspirin 81 MG tablet Take 81 mg by mouth daily.        . Blood Glucose Monitoring Suppl (BLOOD GLUCOSE MONITOR KIT) KIT Check blood sugar once daily as needed for DM       .  ciprofloxacin (CIPRO) 500 MG tablet Take 1 tablet (500 mg total) by mouth 2 (two) times daily.  10 tablet  0  . Dextromethorphan-Guaifenesin (MUCINEX DM MAXIMUM STRENGTH) 60-1200 MG per 12 hr tablet Take 1 tablet by mouth every 12 (twelve) hours as needed.        . ezetimibe (ZETIA) 10 MG tablet Take 1 tablet (10 mg total) by mouth daily.  30 tablet  11  . glucose blood test strip Check blood sugar once daily as needed for DM.       Marland Kitchen ibuprofen (ADVIL,MOTRIN) 200 MG tablet Take 200 mg by mouth every 6 (six) hours as needed.        Marland Kitchen imipramine (TOFRANIL) 25 MG tablet Take 2 tablets by mouth at bedtime       . levothyroxine (SYNTHROID) 50 MCG tablet Take 50 mcg by mouth daily. BRAND NAME ONLY.      Marland Kitchen lisinopril (PRINIVIL,ZESTRIL) 10 MG tablet Take 1 tablet (10 mg total) by mouth daily.  30 tablet  11  . metFORMIN (GLUCOPHAGE) 500 MG tablet Take 1/2 tablet in the morning and 1 tablet by mouth in the evening.  45  tablet  11  . methylphenidate (RITALIN LA) 10 MG 24 hr capsule Take 10 mg by mouth 4 (four) times daily.        Marland Kitchen scopolamine (TRANSDERM-SCOP) 1.5 MG Place 1 patch onto the skin every 3 (three) days as needed.      . simvastatin (ZOCOR) 80 MG tablet Take 1 tablet (80 mg total) by mouth at bedtime.  30 tablet  11        Review of Systems Review of Systems  Constitutional: Negative for fever, appetite change, fatigue and unexpected weight change.  Eyes: Negative for pain and visual disturbance.  Respiratory: Negative for cough and shortness of breath.   Cardiovascular: Negative for cp or palpitations    Gastrointestinal: Negative for nausea, diarrhea and constipation. pos for flank pain  Genitourinary: Negative for urgency and pos for frequency (from water intake), neg for blood in urine or odor or n/v Skin: Negative for pallor or rash   MSK pos for back pain  Neurological: Negative for weakness, light-headedness, numbness and headaches.  Hematological: Negative for adenopathy. Does  not bruise/bleed easily.  Psychiatric/Behavioral: Negative for dysphoric mood. The patient is not nervous/anxious.         Objective:   Physical Exam  Constitutional: She appears well-developed and well-nourished. No distress.       overwt and well appearing   HENT:  Head: Normocephalic and atraumatic.  Mouth/Throat: Oropharynx is clear and moist.  Eyes: Conjunctivae and EOM are normal. Pupils are equal, round, and reactive to light. No scleral icterus.  Neck: Normal range of motion. Neck supple. No JVD present. No thyromegaly present.  Cardiovascular: Normal rate, regular rhythm and normal heart sounds.   Pulmonary/Chest: Effort normal and breath sounds normal. No respiratory distress. She has no wheezes.  Abdominal: Soft. Bowel sounds are normal. She exhibits no mass. There is tenderness. There is no rebound and no guarding.       Tender L flank area Pos CVA tenderness   Musculoskeletal: She exhibits tenderness. She exhibits no edema.       Tender L CVA No bony tenderness Inc back pain with full flex of LS Neg slr  Lymphadenopathy:    She has no cervical adenopathy.  Neurological: She is alert. She has normal reflexes. No cranial nerve deficit. She exhibits normal muscle tone. Coordination normal.  Skin: Skin is warm and dry. No rash noted. No erythema. No pallor.  Psychiatric: She has a normal mood and affect.          Assessment & Plan:

## 2011-11-26 LAB — URINE CULTURE
Colony Count: NO GROWTH
Organism ID, Bacteria: NO GROWTH

## 2011-11-27 ENCOUNTER — Telehealth: Payer: Self-pay | Admitting: Family Medicine

## 2011-11-27 DIAGNOSIS — M545 Low back pain, unspecified: Secondary | ICD-10-CM

## 2011-11-27 DIAGNOSIS — R8290 Unspecified abnormal findings in urine: Secondary | ICD-10-CM

## 2011-11-27 NOTE — Telephone Encounter (Signed)
Pt's urine cx is negative completely which is confusing given her symptoms and also pos ua  I am doing a ref to urology for further eval - please let her know  Let me know how she is doing on the macrobid, thanks

## 2011-11-27 NOTE — Telephone Encounter (Signed)
Advised patient.  She is feeling better with the macrobid.  She agrees to referral, prefers to see someone in [place].

## 2011-12-23 ENCOUNTER — Telehealth: Payer: Self-pay | Admitting: Family Medicine

## 2011-12-23 DIAGNOSIS — E039 Hypothyroidism, unspecified: Secondary | ICD-10-CM

## 2011-12-23 DIAGNOSIS — I10 Essential (primary) hypertension: Secondary | ICD-10-CM

## 2011-12-23 DIAGNOSIS — E785 Hyperlipidemia, unspecified: Secondary | ICD-10-CM

## 2011-12-23 DIAGNOSIS — E119 Type 2 diabetes mellitus without complications: Secondary | ICD-10-CM

## 2011-12-23 NOTE — Telephone Encounter (Signed)
Message copied by Judy Pimple on Tue Dec 23, 2011  9:57 PM ------      Message from: Alvina Chou      Created: Wed Dec 17, 2011  4:37 PM      Regarding: Lab orders for Wed 7.17.13       Patient is scheduled for CPX labs, please order future labs, Thanks , Camelia Eng

## 2011-12-24 ENCOUNTER — Other Ambulatory Visit: Payer: Medicare Other

## 2011-12-31 ENCOUNTER — Ambulatory Visit (INDEPENDENT_AMBULATORY_CARE_PROVIDER_SITE_OTHER): Payer: Medicare Other | Admitting: Family Medicine

## 2011-12-31 ENCOUNTER — Encounter: Payer: Self-pay | Admitting: Family Medicine

## 2011-12-31 VITALS — BP 126/64 | HR 99 | Temp 98.1°F | Ht 61.25 in | Wt 162.2 lb

## 2011-12-31 DIAGNOSIS — E039 Hypothyroidism, unspecified: Secondary | ICD-10-CM

## 2011-12-31 DIAGNOSIS — Z1211 Encounter for screening for malignant neoplasm of colon: Secondary | ICD-10-CM | POA: Insufficient documentation

## 2011-12-31 DIAGNOSIS — Z1231 Encounter for screening mammogram for malignant neoplasm of breast: Secondary | ICD-10-CM

## 2011-12-31 DIAGNOSIS — E785 Hyperlipidemia, unspecified: Secondary | ICD-10-CM

## 2011-12-31 DIAGNOSIS — E119 Type 2 diabetes mellitus without complications: Secondary | ICD-10-CM

## 2011-12-31 DIAGNOSIS — I10 Essential (primary) hypertension: Secondary | ICD-10-CM

## 2011-12-31 LAB — CBC WITH DIFFERENTIAL/PLATELET
Basophils Absolute: 0 10*3/uL (ref 0.0–0.1)
Basophils Relative: 0.2 % (ref 0.0–3.0)
Eosinophils Absolute: 0.2 10*3/uL (ref 0.0–0.7)
HCT: 38.3 % (ref 36.0–46.0)
Lymphs Abs: 2.6 10*3/uL (ref 0.7–4.0)
MCHC: 33.4 g/dL (ref 30.0–36.0)
Monocytes Relative: 6.7 % (ref 3.0–12.0)
Neutro Abs: 4.4 10*3/uL (ref 1.4–7.7)
RDW: 13.2 % (ref 11.5–14.6)
WBC: 7.7 10*3/uL (ref 4.5–10.5)

## 2011-12-31 LAB — COMPREHENSIVE METABOLIC PANEL
ALT: 36 U/L — ABNORMAL HIGH (ref 0–35)
BUN: 22 mg/dL (ref 6–23)
CO2: 29 mEq/L (ref 19–32)
Calcium: 8.8 mg/dL (ref 8.4–10.5)
Chloride: 101 mEq/L (ref 96–112)
Creatinine, Ser: 0.8 mg/dL (ref 0.4–1.2)
GFR: 79.47 mL/min (ref >60.00)
Total Bilirubin: 0.4 mg/dL (ref 0.3–1.2)

## 2011-12-31 LAB — LIPID PANEL
Cholesterol: 136 mg/dL (ref 0–200)
HDL: 46.5 mg/dL (ref >39.00)
Total CHOL/HDL Ratio: 3
Triglycerides: 191 mg/dL — ABNORMAL HIGH (ref 0.0–149.0)

## 2011-12-31 LAB — TSH: TSH: 4.24 u[IU]/mL (ref 0.35–5.50)

## 2011-12-31 NOTE — Progress Notes (Signed)
Subjective:    Patient ID: Amanda King, female    DOB: [birth year], 71 y.o.   MRN: [id number]  HPI Here for check up of chronic medical conditions and to review health mt list  Feels pretty good  No longer hurting - after getting her imipramine refilled   (had trouble refilling this )  Issues with her neurologist   Hypothyroid Lab Results  Component Value Date   TSH 7.59* 07/01/2011   no clinica symptoms  Due for re check   Diabetes Home sugar results - doing better - getting 120s-140s  DM diet - is really watching what she eats  Exercise - is walking with her dog at night  Symptoms-- none  A1C last  Lab Results  Component Value Date   HGBA1C 7.4* 07/01/2011   due for labs now  No problems with medications - metformin  Renal protection- on ace  Last eye exam - utd  bp is    ok Today BP Readings from Last 3 Encounters:  12/31/11 126/64  11/24/11 146/68  11/14/11 126/72    No cp or palpitations or headaches or edema  No side effects to medicines     Colon screen- does not want colonoscopy yet  Will do IFOB   Zoster status Needs to call insurance about coverage of vaccine   Had hysterectomy- no gyn symptoms   Needs labs today  Is still working - and wants to keep working as long as she can -thinks this keeps her sharp physically and mentally  Patient Active Problem List  Diagnosis  . DERMATOPHYTOSIS OF NAIL  . HYPOTHYROIDISM  . DIABETES MELLITUS, TYPE II  . HYPERLIPIDEMIA  . ANXIETY  . NARCOLEPSY W/CATAPLEXY  . HYPERTENSION  . PNEUMONIA, HX OF  . Obesity  . Complicated UTI (urinary tract infection)  . Left low back pain  . Abnormal urinalysis  . Other screening mammogram  . Colon cancer screening   Past Medical History  Diagnosis Date  . Diabetes mellitus     Type II  . Hyperlipidemia   . Hypertension   . Hypothyroidism   . Pneumonia 08/2001   Past Surgical History  Procedure Date  . Appendectomy age 27  . Partial hysterectomy age 89  .  Posterior cervical fusion/foraminotomy 02/05/1975    C5-C6-C7  . Hemilaminectomy     C5-6; C6-7   History  Substance Use Topics  . Smoking status: Former Smoker    Quit date: 06/09/1980  . Smokeless tobacco: Not on file  . Alcohol Use: No   Family History  Problem Relation Age of Onset  . Heart disease Mother     MI  . Peripheral vascular disease Mother   . Hyperlipidemia Mother   . Depression Brother     suicide  . Diabetes Brother   . Diabetes Brother    Allergies  Allergen Reactions  . Penicillins     REACTION: GI upset   Current Outpatient Prescriptions on File Prior to Visit  Medication Sig Dispense Refill  . aspirin 81 MG tablet Take 81 mg by mouth daily.        . Blood Glucose Monitoring Suppl (BLOOD GLUCOSE MONITOR KIT) KIT Check blood sugar once daily as needed for DM       . ezetimibe (ZETIA) 10 MG tablet Take 1 tablet (10 mg total) by mouth daily.  30 tablet  11  . glucose blood test strip Check blood sugar once daily as needed for DM.       Marland Kitchen  ibuprofen (ADVIL,MOTRIN) 200 MG tablet Take 200 mg by mouth every 6 (six) hours as needed.        Marland Kitchen imipramine (TOFRANIL) 25 MG tablet Take 2 tablets by mouth at bedtime       . levothyroxine (SYNTHROID) 50 MCG tablet Take 50 mcg by mouth daily. BRAND NAME ONLY.      Marland Kitchen lisinopril (PRINIVIL,ZESTRIL) 10 MG tablet Take 1 tablet (10 mg total) by mouth daily.  30 tablet  11  . metFORMIN (GLUCOPHAGE) 500 MG tablet Take 1/2 tablet in the morning and 1 tablet by mouth in the evening.  45 tablet  11  . methylphenidate (RITALIN LA) 10 MG 24 hr capsule Take 10 mg by mouth 4 (four) times daily.        Marland Kitchen scopolamine (TRANSDERM-SCOP) 1.5 MG Place 1 patch onto the skin every 3 (three) days as needed.      . simvastatin (ZOCOR) 80 MG tablet Take 1 tablet (80 mg total) by mouth at bedtime.  30 tablet  11  . Dextromethorphan-Guaifenesin (MUCINEX DM MAXIMUM STRENGTH) 60-1200 MG per 12 hr tablet Take 1 tablet by mouth every 12 (twelve) hours as  needed.            Review of Systems Review of Systems  Constitutional: Negative for fever, appetite change, fatigue and unexpected weight change.  Eyes: Negative for pain and visual disturbance.  Respiratory: Negative for cough and shortness of breath.   Cardiovascular: Negative for cp or palpitations    Gastrointestinal: Negative for nausea, diarrhea and constipation.  Genitourinary: Negative for urgency and frequency.  Skin: Negative for pallor or rash   Neurological: Negative for weakness, light-headedness, numbness and headaches.  Hematological: Negative for adenopathy. Does not bruise/bleed easily.  Psychiatric/Behavioral: Negative for dysphoric mood. The patient is not nervous/anxious.         Objective:   Physical Exam  Constitutional: She appears well-developed and well-nourished. No distress.       overwt and well appearing   HENT:  Head: Normocephalic and atraumatic.  Right Ear: External ear normal.  Left Ear: External ear normal.  Nose: Nose normal.  Mouth/Throat: Oropharynx is clear and moist.  Eyes: Conjunctivae and EOM are normal. Pupils are equal, round, and reactive to light. No scleral icterus.  Neck: Normal range of motion. Neck supple. No JVD present. Carotid bruit is not present. No thyromegaly present.  Cardiovascular: Normal rate, regular rhythm, normal heart sounds and intact distal pulses.  Exam reveals no gallop.   Pulmonary/Chest: Effort normal and breath sounds normal. No respiratory distress. She has no wheezes.  Abdominal: Soft. Bowel sounds are normal. She exhibits no distension, no abdominal bruit and no mass. There is no tenderness.  Genitourinary: No breast swelling, tenderness, discharge or bleeding.       Breast exam: No mass, nodules, thickening, tenderness, bulging, retraction, inflamation, nipple discharge or skin changes noted.  No axillary or clavicular LA.  Chaperoned exam.    Musculoskeletal: Normal range of motion. She exhibits no edema  and no tenderness.  Lymphadenopathy:    She has no cervical adenopathy.  Neurological: She is alert. She has normal reflexes. She exhibits normal muscle tone. Coordination normal.  Skin: Skin is warm and dry. No rash noted. No erythema. No pallor.       Tanned Some lentigos  Psychiatric: She has a normal mood and affect.          Assessment & Plan:

## 2011-12-31 NOTE — Patient Instructions (Addendum)
We will set up mammogram at check out  If you are interested in a shingles/zoster vaccine - call your insurance to check on coverage,( you should not get it within 1 month of other vaccines) , then call us for a prescription  for it to take to a pharmacy that gives the shot  Please do stool card for colon cancer screening  Lab today  Keep working on healthy diet and exercise

## 2012-01-01 NOTE — Assessment & Plan Note (Signed)
Labs today on zocor No side effects or problems  Rev low sat fat diet and goals for cholesterol

## 2012-01-01 NOTE — Assessment & Plan Note (Signed)
Labs today on current dose No clinical or exam changes

## 2012-01-01 NOTE — Assessment & Plan Note (Signed)
bp in fair control at this time  No changes needed  Disc lifstyle change with low sodium diet and exercise   

## 2012-01-01 NOTE — Assessment & Plan Note (Signed)
Labs today- expect improvement in a1c this time Disc low glycemic diet/ lifestyle change and improvements made Disc need for wt loss

## 2012-01-01 NOTE — Assessment & Plan Note (Signed)
Scheduled annual screening mammogram Nl breast exam today  Encouraged monthly self exams   

## 2012-01-01 NOTE — Assessment & Plan Note (Signed)
Pt is not ready for colonoscopy- she pref to do ifob Disc this and she is aware this is not as effective for screening but comfortable with that

## 2012-03-31 ENCOUNTER — Other Ambulatory Visit: Payer: Self-pay | Admitting: Family Medicine

## 2012-04-08 ENCOUNTER — Encounter: Payer: Self-pay | Admitting: Family Medicine

## 2012-04-08 ENCOUNTER — Ambulatory Visit (INDEPENDENT_AMBULATORY_CARE_PROVIDER_SITE_OTHER): Payer: Medicare Other | Admitting: Family Medicine

## 2012-04-08 ENCOUNTER — Telehealth: Payer: Self-pay

## 2012-04-08 VITALS — BP 152/80 | HR 92 | Temp 98.1°F | Wt 165.8 lb

## 2012-04-08 DIAGNOSIS — S90819A Abrasion, unspecified foot, initial encounter: Secondary | ICD-10-CM

## 2012-04-08 DIAGNOSIS — IMO0002 Reserved for concepts with insufficient information to code with codable children: Secondary | ICD-10-CM

## 2012-04-08 MED ORDER — DOXYCYCLINE HYCLATE 100 MG PO TABS: 100.0000 mg | ORAL_TABLET | Freq: Two times a day (BID) | ORAL | Status: DC

## 2012-04-08 NOTE — Telephone Encounter (Signed)
One week ago pt caught rt heel on corner of [place]; pt seen at [facility] yesterday and PA made appt with Dr Milinda Antis 04/12/12; pt thought appt was for today, off work today, pt said rt heel area is sore and area size of dime is open and draining pus with red margin around around sore. Pt to see Dr Para March today at 2 pm. Pt understood.

## 2012-04-08 NOTE — Progress Notes (Signed)
Medial R foot, hit near the heel 1 week ago on a door.  Td up to date.  [facility], directed to come here.  Has to wear shoes with a backing/heel at work and this has likely rubbed.  Minimal pain, unless she has been on her feet for prolonged period.  No FCNAVD.  H/o DM2.  Sugar has been variable.  160 in AM, lower later on.  Last A1c 7.6 in 7/13.    Meds, vitals, and allergies reviewed.   ROS: See HPI.  Otherwise, noncontributory.  nad R foot with normal inspection except for centrally denuded superficial abrasion, 2x1 cm, on R medial heel, inferior to med mal, not on plantar side of foot. Appears superficial and irritated with small ring of surrounding erythema.  Doesn't probe at all- no undermining.

## 2012-04-08 NOTE — Patient Instructions (Addendum)
Cover with a bandaid and neosporin.  Wash with soap and water.  No peroxide or alcohol.  Wear open heeled shoes.  Start the antibiotics today.  If not improved, then follow up with Dr. Milinda Antis next week.   Take care.

## 2012-04-09 DIAGNOSIS — S90819A Abrasion, unspecified foot, initial encounter: Secondary | ICD-10-CM | POA: Insufficient documentation

## 2012-04-09 NOTE — Assessment & Plan Note (Signed)
Normal sensation and DP pulse on exam.  Start doxy, covered with bandaid and neosporin, should wears shoes that don't rub. F/u next week prn.  Should heal.  See instructions.

## 2012-04-12 ENCOUNTER — Ambulatory Visit: Payer: Medicare Other | Admitting: Family Medicine

## 2012-04-12 DIAGNOSIS — Z0289 Encounter for other administrative examinations: Secondary | ICD-10-CM

## 2012-04-21 ENCOUNTER — Ambulatory Visit (INDEPENDENT_AMBULATORY_CARE_PROVIDER_SITE_OTHER): Payer: Medicare Other | Admitting: Family Medicine

## 2012-04-21 ENCOUNTER — Encounter: Payer: Self-pay | Admitting: Family Medicine

## 2012-04-21 VITALS — BP 156/72 | HR 98 | Temp 98.4°F | Ht 61.25 in | Wt 166.8 lb

## 2012-04-21 DIAGNOSIS — S90819A Abrasion, unspecified foot, initial encounter: Secondary | ICD-10-CM

## 2012-04-21 DIAGNOSIS — E119 Type 2 diabetes mellitus without complications: Secondary | ICD-10-CM

## 2012-04-21 DIAGNOSIS — IMO0002 Reserved for concepts with insufficient information to code with codable children: Secondary | ICD-10-CM

## 2012-04-21 NOTE — Assessment & Plan Note (Signed)
Abrasion on foot / heel is almost gone No s/s of infection  Will allow her to return to athletic shoes as long as she wears a band aid Note written for work  Update if problems

## 2012-04-21 NOTE — Patient Instructions (Addendum)
You can return to work in [employer] for a1c today for diabetes  If any problems let me know

## 2012-04-21 NOTE — Progress Notes (Signed)
Subjective:    Patient ID: Amanda King, female    DOB: 01-24-1941, 71 y.o.   MRN: [id number]  HPI Here for re check of her R foot - heel injury from [place]  Is getting better now  Almost closed up   Is ready to wear her regular shoes (athletic shoes ) - back to work  [employer] ready- tried them on  Finished her abx  Lab Results  Component Value Date   HGBA1C 7.6* 12/31/2011   is about time to check it again  Sugars are about the same 120-140 in the ams (when she walks they drop to 100-110)  In the afternoon - is lower  On ace for renal protection   Patient Active Problem List  Diagnosis  . DERMATOPHYTOSIS OF NAIL  . HYPOTHYROIDISM  . DIABETES MELLITUS, TYPE II  . HYPERLIPIDEMIA  . ANXIETY  . NARCOLEPSY W/CATAPLEXY  . HYPERTENSION  . PNEUMONIA, HX OF  . Obesity  . Complicated UTI (urinary tract infection)  . Other screening mammogram  . Colon cancer screening  . Foot abrasion   Past Medical History  Diagnosis Date  . Diabetes mellitus     Type II  . Hyperlipidemia   . Hypertension   . Hypothyroidism   . Pneumonia 08/2001   Past Surgical History  Procedure Date  . Appendectomy age 71  . Partial hysterectomy age 66  . Posterior cervical fusion/foraminotomy 02/05/1975    C5-C6-C7  . Hemilaminectomy     C5-6; C6-7   History  Substance Use Topics  . Smoking status: Former Smoker    Quit date: 06/09/1980  . Smokeless tobacco: Not on file  . Alcohol Use: No   Family History  Problem Relation Age of Onset  . Heart disease Mother     MI  . Peripheral vascular disease Mother   . Hyperlipidemia Mother   . Depression Brother     suicide  . Diabetes Brother   . Diabetes Brother    Allergies  Allergen Reactions  . Penicillins     REACTION: GI upset   Current Outpatient Prescriptions on File Prior to Visit  Medication Sig Dispense Refill  . aspirin 81 MG tablet Take 81 mg by mouth daily.        . Blood Glucose Monitoring Suppl (BLOOD GLUCOSE MONITOR  KIT) KIT Check blood sugar once daily as needed for DM       . Dextromethorphan-Guaifenesin (MUCINEX DM MAXIMUM STRENGTH) 60-1200 MG per 12 hr tablet Take 1 tablet by mouth every 12 (twelve) hours as needed.        . doxycycline (VIBRA-TABS) 100 MG tablet Take 1 tablet (100 mg total) by mouth 2 (two) times daily.  20 tablet  0  . ezetimibe (ZETIA) 10 MG tablet Take 1 tablet (10 mg total) by mouth daily.  30 tablet  11  . glucose blood test strip Check blood sugar once daily as needed for DM.       Marland Kitchen ibuprofen (ADVIL,MOTRIN) 200 MG tablet Take 200 mg by mouth every 6 (six) hours as needed.        Marland Kitchen imipramine (TOFRANIL) 25 MG tablet Take 2 tablets by mouth at bedtime       . levothyroxine (SYNTHROID) 50 MCG tablet Take 50 mcg by mouth daily. BRAND NAME ONLY.      Marland Kitchen lisinopril (PRINIVIL,ZESTRIL) 10 MG tablet Take 1 tablet (10 mg total) by mouth daily.  30 tablet  11  . metFORMIN (GLUCOPHAGE) 500  MG tablet Take 1/2 tablet in the morning and 1 tablet by mouth in the evening.  45 tablet  11  . methylphenidate (RITALIN LA) 10 MG 24 hr capsule Take 10 mg by mouth 4 (four) times daily.        . simvastatin (ZOCOR) 80 MG tablet Take 1 tablet (80 mg total) by mouth at bedtime.  30 tablet  11        Review of Systems Review of Systems  Constitutional: Negative for fever, appetite change, fatigue and unexpected weight change.  Eyes: Negative for pain and visual disturbance.  Respiratory: Negative for cough and shortness of breath.   Cardiovascular: Negative for cp or palpitations    Gastrointestinal: Negative for nausea, diarrhea and constipation.  Genitourinary: Negative for urgency and frequency. neg for excessive thirst  Skin: Negative for pallor or rash   Neurological: Negative for weakness, light-headedness, numbness and headaches.  Hematological: Negative for adenopathy. Does not bruise/bleed easily.  Psychiatric/Behavioral: Negative for dysphoric mood. The patient is not nervous/anxious.          Objective:   Physical Exam  Constitutional: She appears well-developed and well-nourished. No distress.       overwt and well appearing   HENT:  Head: Normocephalic and atraumatic.  Eyes: Conjunctivae normal and EOM are normal. Pupils are equal, round, and reactive to light.  Neck: Normal range of motion. Neck supple. Carotid bruit is not present. No thyromegaly present.  Cardiovascular: Normal rate and regular rhythm.   Pulmonary/Chest: Effort normal and breath sounds normal.  Musculoskeletal: She exhibits no edema and no tenderness.  Lymphadenopathy:    She has no cervical adenopathy.  Neurological: She is alert. She has normal reflexes. No cranial nerve deficit or sensory deficit. She exhibits normal muscle tone. Coordination normal.  Skin: Skin is warm and dry. No rash noted. No erythema. No pallor.       r medial heel- 1 cm superficial wound/ abrasion without redness or swelling    Psychiatric: She has a normal mood and affect.          Assessment & Plan:

## 2012-04-21 NOTE — Assessment & Plan Note (Signed)
Per pt sugars are fairly good and stable - diet is improving  Is walking with her dog now  a1c today and decide follow up Nl foot exam

## 2012-04-27 ENCOUNTER — Encounter: Payer: Self-pay | Admitting: *Deleted

## 2012-07-02 ENCOUNTER — Other Ambulatory Visit: Payer: Self-pay | Admitting: Family Medicine

## 2012-08-02 ENCOUNTER — Other Ambulatory Visit: Payer: Self-pay | Admitting: *Deleted

## 2012-08-02 MED ORDER — LEVOTHYROXINE SODIUM 50 MCG PO TABS: 50.0000 ug | ORAL_TABLET | Freq: Every day | ORAL | Status: DC

## 2012-09-08 ENCOUNTER — Other Ambulatory Visit: Payer: Self-pay

## 2012-09-08 MED ORDER — METHYLPHENIDATE HCL 20 MG PO TABS: 10.0000 mg | ORAL_TABLET | Freq: Four times a day (QID) | ORAL | Status: DC

## 2012-09-08 NOTE — Telephone Encounter (Signed)
Patient called requesting refill on Ritalin.

## 2012-11-22 ENCOUNTER — Other Ambulatory Visit: Payer: Self-pay

## 2012-11-22 MED ORDER — METHYLPHENIDATE HCL 20 MG PO TABS: 10.0000 mg | ORAL_TABLET | Freq: Four times a day (QID) | ORAL | Status: DC

## 2012-11-22 NOTE — Telephone Encounter (Signed)
Patient called requesting refill on Methyphenidate.  She would like it mailed to her when it's ready.

## 2012-12-16 ENCOUNTER — Other Ambulatory Visit: Payer: Self-pay

## 2013-01-10 ENCOUNTER — Other Ambulatory Visit: Payer: Self-pay | Admitting: Family Medicine

## 2013-01-10 NOTE — Telephone Encounter (Signed)
Electronic refill request, no recent/future appt., please advise  

## 2013-01-10 NOTE — Telephone Encounter (Signed)
Left voicemail requesting pt to call office 

## 2013-01-10 NOTE — Telephone Encounter (Signed)
Please schedule f/u for fall and refill until then

## 2013-01-13 NOTE — Telephone Encounter (Signed)
Left voicemail requesting pt to call office 

## 2013-01-17 ENCOUNTER — Other Ambulatory Visit: Payer: Self-pay | Admitting: *Deleted

## 2013-01-17 MED ORDER — LISINOPRIL 10 MG PO TABS: ORAL_TABLET | ORAL | Status: DC

## 2013-01-17 MED ORDER — SIMVASTATIN 80 MG PO TABS: ORAL_TABLET | ORAL | Status: DC

## 2013-01-17 MED ORDER — EZETIMIBE 10 MG PO TABS: ORAL_TABLET | ORAL | Status: DC

## 2013-01-17 MED ORDER — METFORMIN HCL 500 MG PO TABS: ORAL_TABLET | ORAL | Status: DC

## 2013-02-07 ENCOUNTER — Other Ambulatory Visit: Payer: Self-pay | Admitting: Family Medicine

## 2013-03-07 ENCOUNTER — Other Ambulatory Visit: Payer: Self-pay | Admitting: Family Medicine

## 2013-03-07 NOTE — Telephone Encounter (Signed)
Electronic refill request, no recent/future appt., please advise  

## 2013-03-07 NOTE — Telephone Encounter (Signed)
Please schedule PE or f/u for winter and refill until then

## 2013-03-08 NOTE — Telephone Encounter (Signed)
Left message on cell VM to have pt call the office

## 2013-03-22 NOTE — Telephone Encounter (Signed)
Pt didn't answer phone so refilled med x1 month and advise pharmacy pt needs a f/u appt

## 2013-03-25 ENCOUNTER — Other Ambulatory Visit: Payer: Self-pay | Admitting: Family Medicine

## 2013-03-28 NOTE — Telephone Encounter (Signed)
Please schedule PE winter and refill until then, thanks

## 2013-03-28 NOTE — Telephone Encounter (Signed)
Electronic refill request, no recent/future appt., please advise  

## 2013-03-30 NOTE — Telephone Encounter (Signed)
Left voicemail requesting pt to call office 

## 2013-03-31 NOTE — Telephone Encounter (Signed)
Home # off so left voicemail on cell # requesting pt to call office

## 2013-04-06 ENCOUNTER — Other Ambulatory Visit: Payer: Self-pay | Admitting: *Deleted

## 2013-04-06 MED ORDER — SYNTHROID 50 MCG PO TABS: 50.0000 ug | ORAL_TABLET | Freq: Every day | ORAL | Status: DC

## 2013-04-06 NOTE — Telephone Encounter (Signed)
Letter mailed to pt informing her that she needs to schedule an appointment for cpx within the next couple of months.

## 2013-04-07 NOTE — Telephone Encounter (Signed)
Left 3rd voicemail requesting pt to call office and also mailed letter letting pt know she is due for a CPE

## 2013-04-12 ENCOUNTER — Other Ambulatory Visit: Payer: Self-pay | Admitting: Neurology

## 2013-04-13 NOTE — Telephone Encounter (Signed)
Pt never responded to my calls and letter so denied Rx and advise pharmacy that pt needs appt

## 2013-04-20 ENCOUNTER — Telehealth: Payer: Self-pay | Admitting: Neurology

## 2013-04-20 MED ORDER — METHYLPHENIDATE HCL 20 MG PO TABS: 20.0000 mg | ORAL_TABLET | Freq: Four times a day (QID) | ORAL | Status: DC

## 2013-04-20 NOTE — Telephone Encounter (Signed)
Called patient to inform her that her prescription was ready to be picked up and also let her know if she has any questions or concerns to call the office.

## 2013-04-20 NOTE — Telephone Encounter (Signed)
Patient needs to be called to pick up

## 2013-05-21 ENCOUNTER — Other Ambulatory Visit: Payer: Self-pay | Admitting: Family Medicine

## 2013-06-20 ENCOUNTER — Other Ambulatory Visit: Payer: Self-pay | Admitting: *Deleted

## 2013-06-20 MED ORDER — SYNTHROID 50 MCG PO TABS: 50.0000 ug | ORAL_TABLET | Freq: Every day | ORAL | Status: DC

## 2013-06-21 ENCOUNTER — Other Ambulatory Visit: Payer: Self-pay | Admitting: *Deleted

## 2013-07-15 ENCOUNTER — Encounter: Payer: Self-pay | Admitting: Neurology

## 2013-07-15 ENCOUNTER — Ambulatory Visit (INDEPENDENT_AMBULATORY_CARE_PROVIDER_SITE_OTHER): Payer: Medicare Other | Admitting: Neurology

## 2013-07-15 ENCOUNTER — Encounter (INDEPENDENT_AMBULATORY_CARE_PROVIDER_SITE_OTHER): Payer: Self-pay

## 2013-07-15 VITALS — BP 152/74 | HR 97 | Resp 15 | Ht 62.75 in | Wt 169.0 lb

## 2013-07-15 DIAGNOSIS — G4710 Hypersomnia, unspecified: Secondary | ICD-10-CM

## 2013-07-15 DIAGNOSIS — G47411 Narcolepsy with cataplexy: Secondary | ICD-10-CM

## 2013-07-15 MED ORDER — IMIPRAMINE HCL 50 MG PO TABS: 50.0000 mg | ORAL_TABLET | Freq: Every day | ORAL | Status: DC

## 2013-07-15 MED ORDER — METHYLPHENIDATE HCL 20 MG PO TABS: 20.0000 mg | ORAL_TABLET | Freq: Four times a day (QID) | ORAL | Status: DC

## 2013-07-15 NOTE — Patient Instructions (Signed)
Narcolepsy Narcolepsy is a disabling neurological disorder of sleep regulation. It affects the control of sleep. It also affects the control of wakefulness. It is an interruption of the dreaming state of sleep. This state is known as REM or rapid eye movement sleep.  SYMPTOMS  The development, number, and severity of symptoms vary widely among people with the disorder. Symptoms generally begin between the ages of 15 and 30. The four classic symptoms of the disorder are:   Excessive daytime sleepiness.  Cataplexy. This is sudden, brief episodes of muscle weakness or paralysis. It is caused by strong emotions. Common strong emotions are laughter, anger, surprise, or anticipation.  Sleep paralysis. This is paralysis upon falling asleep or waking up.  Hallucinations. These are vivid dream-like images that occur at when you first fall asleep. Other symptoms include:   Unrelenting excessive sleepiness. This is usually the first and most obvious symptom.  Sleep attacks. Patients have strong sleep attacks throughout the day. These attacks can last for 30 seconds to more than 30 minutes. These happen no matter how much or how well the person slept the night before. These attacks end up making the person sleep at work and social events. The person can fall asleep while eating, talking, and driving. They also fall asleep at other out of place times.  Disturbed nighttime sleep.  Tossing and turning in bed.  Leg jerks.  Nightmares.  Waking up often. DIAGNOSIS  It's possible that genetics play a role in this disorder. Narcolepsy is not a rare disorder. It is often misdiagnosed. It is often diagnosed years after symptoms first appear. Early diagnosis and treatment are important. This help the physical and mental well-being of the patient. TREATMENT  There is no cure for narcolepsy. The symptoms can be controlled with behavioral and medical therapy. The excessive daytime sleepiness may be treated with  stimulant drugs. It may also be treated with the drug modafinil (Provigil). Cataplexy and other REM-sleep symptoms may be treated with antidepressant medications. Medications will reduce the symptoms. Medications will not ease symptoms entirely. Many available medications have side effects. Basic lifestyle changes may also reduce the symptoms. These changes include having regular sleep schedules and scheduled daytime naps. Other lifestyle changes include avoiding "over-stimulating" situations. Document Released: 05/16/2002 Document Revised: 08/18/2011 Document Reviewed: 05/26/2005 [employer] Patient Information 2014 [employer].  

## 2013-07-15 NOTE — Progress Notes (Signed)
[facility]  Provider:  Larey Seat, M D  Referring Provider: Abner Greenspan, MD Primary Care Physician:  Loura Pardon, MD  Chief Complaint  Patient presents with  . Follow-up    Room 10  . narcolepsy    HPI:  Amanda King is a 73 y.o. female  Is seen here as a referral/ revisit  from Dr. Glori Bickers for follow up of symptoms of  Hypersomnia,  I had last seen Amanda King in 2013 after she was referred to me with a diagnosis of narcolepsy with cataplexy. She had been treated on generic imipramine and Ritalin 50 mg of imipramine at night and 20 mg of methylphenidate 4 times daily. In  2013 she described that in spite of the medication she had a cataplectic attack related to the emotionally upsetting news  of her granddaughter's father-in-law's death.  She reported rare sleep hallucinations and had been followed by Dr. Morrell Riddle , Dr. Earley Favor  and was Dr. Tressia Danas patient until his retirement.  The patient states that the narcolepsy was diagnosed  after 1989 with cataplectic attacks.;  she was unsure of the was any viral onset and that she has no family history of narcolepsy but at the time underwent a neck surgery with myelogram and somehow  Remained always sleepier since that time.  She reported ongoing cataplectic spells whenever she tried to change dosing of her medication - she had been using Tofranil as the brand name to sleep  but was due to costs and insurance coverage forced to change to generic imipramine, which triggered additional cataplectic attacks.  We have discussed Xyrem in our last visit, but postponed the initiation as she felt that her current regimen was sufficient.  The patient initiates bedtime normally at 11 PM, she will fall asleep promptly, times a week Mondays, Wednesdays and Fridays she will rise at 7 AM to go to the gym. Spontaneously she will wake up between 7 and 8 on other days as well. During the day she often takes a nap on her couch, about 45  minutes duration that she feels is refreshing. The nocturnal sleep time is estimated to be around 8 hours as well. He can be fragmented but visit dreams but he denied any nightmares or any "acting out "of dreams. She is very emotional at funerals and had multiple cataplectic spells in that setting.    The patient just retired from [employer]. after 24 years and is no rearranging her day-to-day life. 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  She isn't from a smoker but quit over 32 years ago and she has not used alcohol or recreational drugs. She would drink up to 3 cups of caffeinated beverages per day.  Review of Systems: Out of a complete 14 system review, the patient complains of only the following symptoms, and all other reviewed systems are negative. The patient endorsed the fatigue severity scale at 10 pints and the Epworth Sleepiness Scale at 13 points the geriatric depression score at 0 point. She has significant loss of hearing and wears hearing aids bilaterally.   History   Social History  . Marital Status: Widowed    Spouse Name: N/A    Number of Children: 3  . Years of Education: GED   Occupational History  .  Social History Main Topics  . Smoking status: Former Smoker    Quit date: 06/09/1980  . Smokeless tobacco: Never Used  . Alcohol Use: No  . Drug Use: No  . Sexual Activity: Not on file   Other Topics Concern  . Not on file   Social History Narrative   Patient is widowed. and her son lives with her.   Walks for exercise.     Patient has three children.   Patient is right-handed.   Patient drinks three cups of caffeine daily.     Patient is retired.   Patient has a GED.    Family History  Problem Relation Age of Onset  . Heart disease Mother     MI  . Peripheral vascular disease Mother   .  Hyperlipidemia Mother   . Depression Brother     suicide  . Diabetes Brother   . Diabetes Brother   . Sleep apnea Son   . Sleep apnea Brother     Past Medical History  Diagnosis Date  . Diabetes mellitus     Type II  . Hyperlipidemia   . Hypertension   . Hypothyroidism   . Pneumonia 08/2001  . Narcolepsy with cataplexy   . Migraine     Past Surgical History  Procedure Laterality Date  . Appendectomy  age 43  . Partial hysterectomy  age 32  . Posterior cervical fusion/foraminotomy  02/05/1975    C5-C6-C7  . Hemilaminectomy      C5-6; C6-7    Current Outpatient Prescriptions  Medication Sig Dispense Refill  . aspirin 81 MG tablet Take 81 mg by mouth daily.        . Blood Glucose Monitoring Suppl (BLOOD GLUCOSE MONITOR KIT) KIT Check blood sugar once daily as needed for DM       . Dextromethorphan-Guaifenesin (MUCINEX DM MAXIMUM STRENGTH) 60-1200 MG per 12 hr tablet Take 1 tablet by mouth every 12 (twelve) hours as needed.        . doxycycline (VIBRA-TABS) 100 MG tablet Take 1 tablet (100 mg total) by mouth 2 (two) times daily.  20 tablet  0  . glucose blood test strip Check blood sugar once daily as needed for DM.       Marland Kitchen ibuprofen (ADVIL,MOTRIN) 200 MG tablet Take 200 mg by mouth every 6 (six) hours as needed.        Marland Kitchen imipramine (TOFRANIL) 25 MG tablet Take 2 tablets by mouth at bedtime       . imipramine (TOFRANIL) 50 MG tablet TAKE ONE AT NIGHT  90 tablet  3  . lisinopril (PRINIVIL,ZESTRIL) 10 MG tablet TAKE 1 TABLET (10 MG TOTAL) BY MOUTH DAILY.  30 tablet  3  . metFORMIN (GLUCOPHAGE) 500 MG tablet TAKE 1/2 TABLET IN THE MORNING AND 1 TABLET BY MOUTH IN THE EVENING.  45 tablet  3  . methylphenidate (RITALIN) 20 MG tablet Take 1 tablet (20 mg total) by mouth 4 (four) times daily. Call patient to pick up Rx  180 tablet  0  . simvastatin (ZOCOR) 80 MG tablet TAKE 1 TABLET (80 MG TOTAL) BY MOUTH AT BEDTIME.  30 tablet  3  . SYNTHROID 50 MCG tablet Take 1 tablet (50 mcg total)  by mouth daily.  30 tablet  4  . ZETIA 10 MG tablet TAKE 1 TABLET (10 MG TOTAL) BY MOUTH DAILY.  30 tablet  3   No current facility-administered medications for this visit.    Allergies as  of 07/15/2013 - Review Complete 07/15/2013  Allergen Reaction Noted  . Penicillins  04/02/2007    Vitals: BP 152/74  Pulse 97  Resp 15  Ht 5' 2.75" (1.594 m)  Wt 169 lb (76.658 kg)  BMI 30.17 kg/m2 Last Weight:  Wt Readings from Last 1 Encounters:  07/15/13 169 lb (76.658 kg)   Last Height:   Ht Readings from Last 1 Encounters:  07/15/13 5' 2.75" (1.594 m)   Physical exam:  General: The patient is awake, alert and appears not in acute distress. The patient is well groomed. Head: Normocephalic, atraumatic. Neck is supple. Mallampati 3 , neck circumference:15.25 . Cardiovascular:  Regular rate and rhythm , without  murmurs or carotid bruit, and without distended neck veins. Respiratory: Lungs are clear to auscultation. Skin:  Without evidence of edema, or rash Trunk: BMI is  elevated and patient  has normal posture.  Neurologic exam : The patient is awake and alert, oriented to place and time.  Memory subjective  described as intact.  There is a normal attention span & concentration ability.  Speech is fluent without dysarthria,  Mild dysphonia and nasal speech , not aphasia. Mood and affect are appropriate.  Cranial nerves: Pupils are equal and briskly reactive to light. Funduscopic exam without  evidence of pallor or edema.  Extraocular movements  in vertical and horizontal planes intact and without nystagmus. Visual fields by finger perimetry are intact. Hearing to finger rub intact.  Facial sensation intact to fine touch.  Facial motor strength is symmetric and tongue and uvula move midline.  Motor exam:   Normal tone and normal muscle bulk and symmetric normal strength in all extremities.  Sensory:  Fine touch, pinprick and vibration were tested in all extremities. Proprioception  is tested in the upper extremities only. This was  normal.  Coordination: Rapid alternating movements in the fingers/hands is tested and normal. Finger-to-nose maneuver tested and normal without evidence of ataxia, dysmetria or tremor.  Gait and station: Patient walks without assistive device and is able and assisted stool climb up to the exam table. Strength within normal limits. Stance is stable and normal. Tandem gait is unfragmented. Romberg testing is normal.  Deep tendon reflexes: in the  upper and lower extremities are symmetric and intact.  Babinski maneuver response is downgoing.   Assessment:  After physical and neurologic examination, review of laboratory studies, imaging, neurophysiology testing and this patient carries the diagnosis of cataplexy and narcolepsy in 1984, after suffering severe cataplectic attacks.  She had migraines frequently which Dr. Fernanda Drum addressed.  Sleep hallucinations are rare now.  There is no longer access to the original sleep studies.   Plan:  Treatment plan and additional workup :  HLA testing for narcolepsy with cataplexy .  refill Tofranil,  Ritalin.

## 2013-07-26 ENCOUNTER — Telehealth: Payer: Self-pay | Admitting: Neurology

## 2013-07-26 NOTE — Telephone Encounter (Signed)
Left message for patient to come in and have labs drawn for HLA typing for narcolepsy.  Told her to call office and let us know when she can come.

## 2013-08-25 ENCOUNTER — Ambulatory Visit: Payer: Self-pay | Admitting: Neurology

## 2013-09-11 ENCOUNTER — Other Ambulatory Visit: Payer: Self-pay | Admitting: Family Medicine

## 2013-09-13 ENCOUNTER — Telehealth: Payer: Self-pay | Admitting: Family Medicine

## 2013-09-13 DIAGNOSIS — E039 Hypothyroidism, unspecified: Secondary | ICD-10-CM

## 2013-09-13 DIAGNOSIS — I10 Essential (primary) hypertension: Secondary | ICD-10-CM

## 2013-09-13 DIAGNOSIS — E785 Hyperlipidemia, unspecified: Secondary | ICD-10-CM

## 2013-09-13 DIAGNOSIS — E119 Type 2 diabetes mellitus without complications: Secondary | ICD-10-CM

## 2013-09-13 NOTE — Telephone Encounter (Signed)
Message copied by Judy PimpleWER, MARNE A on Tue Sep 13, 2013 12:03 PM ------      Message from: Alvina ChouWALSH, TERRI J      Created: Thu Sep 08, 2013 10:16 AM      Regarding: Lab orders for Wednesday, 4.8.15       Patient is scheduled for CPX labs, please order future labs, Thanks , Terri       ------

## 2013-09-14 ENCOUNTER — Telehealth: Payer: Self-pay | Admitting: Family Medicine

## 2013-09-14 ENCOUNTER — Other Ambulatory Visit (INDEPENDENT_AMBULATORY_CARE_PROVIDER_SITE_OTHER): Payer: Medicare Other

## 2013-09-14 DIAGNOSIS — E039 Hypothyroidism, unspecified: Secondary | ICD-10-CM

## 2013-09-14 DIAGNOSIS — I10 Essential (primary) hypertension: Secondary | ICD-10-CM

## 2013-09-14 DIAGNOSIS — E785 Hyperlipidemia, unspecified: Secondary | ICD-10-CM

## 2013-09-14 DIAGNOSIS — E119 Type 2 diabetes mellitus without complications: Secondary | ICD-10-CM

## 2013-09-14 LAB — COMPREHENSIVE METABOLIC PANEL
ALK PHOS: 89 U/L (ref 39–117)
ALT: 38 U/L — AB (ref 0–35)
AST: 28 U/L (ref 0–37)
Albumin: 4.3 g/dL (ref 3.5–5.2)
BILIRUBIN TOTAL: 0.5 mg/dL (ref 0.3–1.2)
BUN: 18 mg/dL (ref 6–23)
CO2: 27 mEq/L (ref 19–32)
CREATININE: 0.9 mg/dL (ref 0.4–1.2)
Calcium: 9 mg/dL (ref 8.4–10.5)
Chloride: 101 mEq/L (ref 96–112)
GFR: 65.92 mL/min (ref >60.00)
Glucose, Bld: 226 mg/dL — ABNORMAL HIGH (ref 70–99)
Potassium: 4.3 mEq/L (ref 3.5–5.1)
Sodium: 136 mEq/L (ref 135–145)
TOTAL PROTEIN: 7.2 g/dL (ref 6.0–8.3)

## 2013-09-14 LAB — CBC WITH DIFFERENTIAL/PLATELET
BASOS ABS: 0 10*3/uL (ref 0.0–0.1)
Basophils Relative: 0.3 % (ref 0.0–3.0)
EOS ABS: 0.1 10*3/uL (ref 0.0–0.7)
Eosinophils Relative: 1.9 % (ref 0.0–5.0)
HEMATOCRIT: 39.5 % (ref 36.0–46.0)
Hemoglobin: 13.4 g/dL (ref 12.0–15.0)
LYMPHS ABS: 2.2 10*3/uL (ref 0.7–4.0)
Lymphocytes Relative: 34 % (ref 12.0–46.0)
MCHC: 33.8 g/dL (ref 30.0–36.0)
MCV: 89.6 fl (ref 78.0–100.0)
MONOS PCT: 7.1 % (ref 3.0–12.0)
Monocytes Absolute: 0.5 10*3/uL (ref 0.1–1.0)
NEUTROS ABS: 3.7 10*3/uL (ref 1.4–7.7)
Neutrophils Relative %: 56.7 % (ref 43.0–77.0)
Platelets: 241 10*3/uL (ref 150.0–400.0)
RBC: 4.4 Mil/uL (ref 3.87–5.11)
RDW: 13.5 % (ref 11.5–14.6)
WBC: 6.4 10*3/uL (ref 4.5–10.5)

## 2013-09-14 LAB — LIPID PANEL
Cholesterol: 129 mg/dL (ref 0–200)
HDL: 41 mg/dL (ref >39.00)
LDL Cholesterol: 49 mg/dL (ref 0–99)
TRIGLYCERIDES: 195 mg/dL — AB (ref 0.0–149.0)
Total CHOL/HDL Ratio: 3
VLDL: 39 mg/dL (ref 0.0–40.0)

## 2013-09-14 LAB — TSH: TSH: 9.15 u[IU]/mL — AB (ref 0.35–5.50)

## 2013-09-14 LAB — HEMOGLOBIN A1C: Hgb A1c MFr Bld: 10.3 % — ABNORMAL HIGH (ref 4.6–6.5)

## 2013-09-14 NOTE — Telephone Encounter (Signed)
Relevant patient education assigned to patient using Emmi. ° °

## 2013-09-21 ENCOUNTER — Ambulatory Visit (INDEPENDENT_AMBULATORY_CARE_PROVIDER_SITE_OTHER): Payer: Medicare Other | Admitting: Family Medicine

## 2013-09-21 ENCOUNTER — Encounter: Payer: Self-pay | Admitting: Family Medicine

## 2013-09-21 VITALS — BP 146/82 | HR 98 | Temp 98.3°F | Ht 61.25 in | Wt 163.0 lb

## 2013-09-21 DIAGNOSIS — Z0000 Encounter for general adult medical examination without abnormal findings: Secondary | ICD-10-CM

## 2013-09-21 DIAGNOSIS — E785 Hyperlipidemia, unspecified: Secondary | ICD-10-CM

## 2013-09-21 DIAGNOSIS — E119 Type 2 diabetes mellitus without complications: Secondary | ICD-10-CM

## 2013-09-21 DIAGNOSIS — Z1231 Encounter for screening mammogram for malignant neoplasm of breast: Secondary | ICD-10-CM

## 2013-09-21 DIAGNOSIS — E039 Hypothyroidism, unspecified: Secondary | ICD-10-CM

## 2013-09-21 DIAGNOSIS — E669 Obesity, unspecified: Secondary | ICD-10-CM

## 2013-09-21 DIAGNOSIS — Z1211 Encounter for screening for malignant neoplasm of colon: Secondary | ICD-10-CM

## 2013-09-21 DIAGNOSIS — I10 Essential (primary) hypertension: Secondary | ICD-10-CM

## 2013-09-21 LAB — HM DEXA SCAN

## 2013-09-21 MED ORDER — METFORMIN HCL 500 MG PO TABS
500.0000 mg | ORAL_TABLET | Freq: Two times a day (BID) | ORAL | Status: DC
Start: 2013-09-21 — End: 2013-12-26

## 2013-09-21 NOTE — Patient Instructions (Signed)
Your diabetes is out of control - get back on track with diet and exercise  Also increase your metformin to one pill twice daily  Schedule follow up with labs prior in 3 months  Don't forget to get your eyes checked  Please do the stool card for colon cancer screening  Stop at check out for your mammogram referral If you are interested in a shingles/zoster vaccine - call your insurance to check on coverage,( you should not get it within 1 month of other vaccines) , then call us for a prescription  for it to take to a pharmacy that gives the shot , or make a nurse visit to get it here depending on your coverage  don't forget to work on a living will Try to get 1200-1500 mg of calcium per day with at least 1000 iu of vitamin D - for bone health  Don't miss thyroid doses - schedule labs in 2-3 weeks to re check your tsh (thyroid test)

## 2013-09-21 NOTE — Progress Notes (Signed)
Pre visit review using our clinic review tool, if applicable. No additional management support is needed unless otherwise documented below in the visit note. 

## 2013-09-21 NOTE — Progress Notes (Signed)
Subjective:    Patient ID: Amanda King, female    DOB: 21-May-1941, 73 y.o.   MRN: [id number]  HPI I have personally reviewed the Medicare Annual Wellness questionnaire and have noted 1. The patient's medical and social history 2. Their use of alcohol, tobacco or illicit drugs 3. Their current medications and supplements 4. The patient's functional ability including ADL's, fall risks, home safety risks and hearing or visual             impairment. 5. Diet and physical activities 6. Evidence for depression or mood disorders  The patients weight, height, BMI have been recorded in the chart and visual acuity is per eye clinic.  I have made referrals, counseling and provided education to the patient based review of the above and I have provided the pt with a written personalized care plan for preventive services.  She retired and moved back home and her son cooks for her  Eats much differently and gained wt initially and sugar went up   See scanned forms.  Routine anticipatory guidance given to patient.  See health maintenance. Colon cancer screening- declines colonosc , will do stool card  Breast cancer screening-no mammogram since 08 - she has decided to do them again - wants to schedule a mammogram Self breast exam-normal  Flu vaccine- forgot a flu shot this season - will get one next fall  Tetanus vaccine  1/13 Pneumovax 9/09  Zoster vaccine - she is interested but has not called [facility] yet to check on coverage  Advance directive-does not have a living will - but knows she needs to  Cognitive function addressed- see scanned forms- and if abnormal then additional documentation follows. -no problems  (in fact she is looking into a part time job)  PMH and [place] reviewed  Meds, vitals, and allergies reviewed.   ROS: See HPI.  Otherwise negative.    Diabetes Home sugar results - in labs 226 DM diet - just started back on DM diet -now chicken and fish and veg No longer eating  candy/ cookies and ice cream  Exercise - is back to the gym and walking  Symptoms-none  A1C last  Lab Results  Component Value Date   HGBA1C 10.3* 09/14/2013  was 7.7   No problems with medications -no missed doses  Renal protection-on ace  Last eye exam - a year ago -she will make her appt   Stays busy working outdoors and making quilts     Osteopenia  dexa 12/06 She declines further dexa No fractures  Not taking ca and D  Hyperlipidemia Lab Results  Component Value Date   CHOL 129 09/14/2013   CHOL 136 12/31/2011   CHOL 147 07/01/2011   Lab Results  Component Value Date   HDL 41.00 09/14/2013   HDL 46.50 12/31/2011   HDL 48.70 07/01/2011   Lab Results  Component Value Date   LDLCALC 49 09/14/2013   LDLCALC 51 12/31/2011   LDLCALC 67 07/01/2011   Lab Results  Component Value Date   TRIG 195.0* 09/14/2013   TRIG 191.0* 12/31/2011   TRIG 157.0* 07/01/2011   Lab Results  Component Value Date   CHOLHDL 3 09/14/2013   CHOLHDL 3 12/31/2011   CHOLHDL 3 07/01/2011   Lab Results  Component Value Date   LDLDIRECT 79.7 06/06/2010   LDLDIRECT 172.3 04/29/2010   LDLDIRECT 86.4 12/13/2009    zocor and zetia and diet   Hypothyroid -may have mised doses Lab Results  Component Value Date   TSH 9.15* 09/14/2013    Patient Active Problem List   Diagnosis Date Noted  . Hypersomnia, persistent 07/15/2013  . Foot abrasion 04/09/2012  . Other screening mammogram 12/31/2011  . Colon cancer screening 12/31/2011  . Complicated UTI (urinary tract infection) 11/14/2011  . Obesity 07/01/2011  . ANXIETY 05/06/2010  . DERMATOPHYTOSIS OF NAIL 12/21/2008  . HYPOTHYROIDISM 09/02/2006  . DIABETES MELLITUS, TYPE II 09/02/2006  . HYPERLIPIDEMIA 09/02/2006  . NARCOLEPSY W/CATAPLEXY 09/02/2006  . HYPERTENSION 09/02/2006  . PNEUMONIA, HX OF 09/02/2006   Past Medical History  Diagnosis Date  . Diabetes mellitus     Type II  . Hyperlipidemia   . Hypertension   . Hypothyroidism   . Pneumonia  08/2001  . Narcolepsy with cataplexy   . Migraine    Past Surgical History  Procedure Laterality Date  . Appendectomy  age 36  . Partial hysterectomy  age 89  . Posterior cervical fusion/foraminotomy  02/05/1975    C5-C6-C7  . Hemilaminectomy      C5-6; C6-7   History  Substance Use Topics  . Smoking status: Former Smoker    Quit date: 06/09/1980  . Smokeless tobacco: Never Used  . Alcohol Use: No   Family History  Problem Relation Age of Onset  . Heart disease Mother     MI  . Peripheral vascular disease Mother   . Hyperlipidemia Mother   . Depression Brother     suicide  . Diabetes Brother   . Diabetes Brother   . Sleep apnea Son   . Sleep apnea Brother    Allergies  Allergen Reactions  . Penicillins     REACTION: GI upset   Current Outpatient Prescriptions on File Prior to Visit  Medication Sig Dispense Refill  . aspirin 81 MG tablet Take 81 mg by mouth daily.        . Blood Glucose Monitoring Suppl (BLOOD GLUCOSE MONITOR KIT) KIT Check blood sugar once daily as needed for DM       . glucose blood test strip Check blood sugar once daily as needed for DM.       Marland Kitchen ibuprofen (ADVIL,MOTRIN) 200 MG tablet Take 200 mg by mouth every 6 (six) hours as needed.        Marland Kitchen imipramine (TOFRANIL) 50 MG tablet Take 1 tablet (50 mg total) by mouth at bedtime.  90 tablet  3  . lisinopril (PRINIVIL,ZESTRIL) 10 MG tablet TAKE 1 TABLET (10 MG TOTAL) BY MOUTH DAILY.  30 tablet  0  . methylphenidate (RITALIN) 20 MG tablet Take 1 tablet (20 mg total) by mouth 4 (four) times daily. Call patient to pick up Rx  180 tablet  0  . simvastatin (ZOCOR) 80 MG tablet TAKE 1 TABLET (80 MG TOTAL) BY MOUTH AT BEDTIME.  30 tablet  0  . SYNTHROID 50 MCG tablet Take 1 tablet (50 mcg total) by mouth daily.  30 tablet  4  . ZETIA 10 MG tablet TAKE 1 TABLET (10 MG TOTAL) BY MOUTH DAILY.  30 tablet  3   No current facility-administered medications on file prior to visit.      Review of Systems    Review  of Systems  Constitutional: Negative for fever, appetite change, fatigue and unexpected weight change.  Eyes: Negative for pain and visual disturbance.  Respiratory: Negative for cough and shortness of breath.   Cardiovascular: Negative for cp or palpitations    Gastrointestinal: Negative for nausea, diarrhea and  constipation.  Genitourinary: Negative for urgency and frequency.  Skin: Negative for pallor or rash   Neurological: Negative for weakness, light-headedness, numbness and headaches.  Hematological: Negative for adenopathy. Does not bruise/bleed easily.  Psychiatric/Behavioral: Negative for dysphoric mood. The patient is not nervous/anxious.      Objective:   Physical Exam  Constitutional: She appears well-developed and well-nourished. No distress.  overwt and well app  HENT:  Head: Normocephalic and atraumatic.  Right Ear: External ear normal.  Left Ear: External ear normal.  Mouth/Throat: Oropharynx is clear and moist.  Eyes: Conjunctivae and EOM are normal. Pupils are equal, round, and reactive to light. No scleral icterus.  Neck: Normal range of motion. Neck supple. No JVD present. Carotid bruit is not present. No thyromegaly present.  Cardiovascular: Normal rate, regular rhythm, normal heart sounds and intact distal pulses.  Exam reveals no gallop.   Pulmonary/Chest: Effort normal and breath sounds normal. No respiratory distress. She has no wheezes. She exhibits no tenderness.  Abdominal: Soft. Bowel sounds are normal. She exhibits no distension, no abdominal bruit and no mass. There is no tenderness.  Genitourinary: No breast swelling, tenderness, discharge or bleeding.  Musculoskeletal: Normal range of motion. She exhibits no edema and no tenderness.  Lymphadenopathy:    She has no cervical adenopathy.  Neurological: She is alert. She has normal reflexes. No cranial nerve deficit. She exhibits normal muscle tone. Coordination normal.  Skin: Skin is warm and dry. No  rash noted. No erythema. No pallor.  Psychiatric: She has a normal mood and affect.          Assessment & Plan:

## 2013-09-22 DIAGNOSIS — Z0000 Encounter for general adult medical examination without abnormal findings: Secondary | ICD-10-CM | POA: Insufficient documentation

## 2013-09-22 NOTE — Assessment & Plan Note (Signed)
Scheduled annual screening mammogram Nl breast exam today  Encouraged monthly self exams   

## 2013-09-22 NOTE — Assessment & Plan Note (Signed)
Disc goals for lipids and reasons to control them Rev labs with pt Rev low sat fat diet in detail  On statin and zetia Trig high poss due to sugar

## 2013-09-22 NOTE — Assessment & Plan Note (Signed)
tsh is high poss due to missed doses  Re check with better compliance planned - then will change dose if necessary

## 2013-09-22 NOTE — Assessment & Plan Note (Signed)
bp in fair control at this time  BP Readings from Last 1 Encounters:  09/21/13 146/82   No changes needed Disc lifstyle change with low sodium diet and exercise   Labs rev

## 2013-09-22 NOTE — Assessment & Plan Note (Signed)
Out of control with poor diet Pt plans to change diet and is now exercising daily- long disc re this Inc metformin to 1 bid  Lab and f/u 3 mo

## 2013-09-22 NOTE — Assessment & Plan Note (Signed)
D/w patient re:options for colon cancer screening, including IFOB vs. colonoscopy.  Risks and benefits of both were discussed and patient voiced understanding.  Pt elects for:IFOB  

## 2013-09-22 NOTE — Assessment & Plan Note (Signed)
Discussed how this problem influences overall health and the risks it imposes  Reviewed plan for weight loss with lower calorie diet (via better food choices and also portion control or program like weight watchers) and exercise building up to or more than 30 minutes 5 days per week including some aerobic activity    

## 2013-09-22 NOTE — Assessment & Plan Note (Signed)
Reviewed health habits including diet and exercise and skin cancer prevention Reviewed appropriate screening tests for age  Also reviewed health mt list, fam hx and immunization status , as well as social and family history   See HPI Lab rev  

## 2013-09-27 ENCOUNTER — Telehealth: Payer: Self-pay

## 2013-09-27 NOTE — Telephone Encounter (Signed)
Relevant patient education assigned to patient using Emmi. ° °

## 2013-10-09 ENCOUNTER — Other Ambulatory Visit: Payer: Self-pay | Admitting: Family Medicine

## 2013-10-10 ENCOUNTER — Other Ambulatory Visit: Payer: Self-pay | Admitting: Family Medicine

## 2013-10-11 ENCOUNTER — Encounter: Payer: Self-pay | Admitting: Family Medicine

## 2013-10-11 ENCOUNTER — Other Ambulatory Visit: Payer: Self-pay | Admitting: Family Medicine

## 2013-10-11 ENCOUNTER — Ambulatory Visit: Payer: Self-pay | Admitting: Family Medicine

## 2013-10-12 ENCOUNTER — Other Ambulatory Visit (INDEPENDENT_AMBULATORY_CARE_PROVIDER_SITE_OTHER): Payer: Medicare Other

## 2013-10-12 DIAGNOSIS — E039 Hypothyroidism, unspecified: Secondary | ICD-10-CM

## 2013-10-12 LAB — TSH: TSH: 5.44 u[IU]/mL — ABNORMAL HIGH (ref 0.35–4.50)

## 2013-10-13 ENCOUNTER — Telehealth: Payer: Self-pay | Admitting: Family Medicine

## 2013-10-13 ENCOUNTER — Encounter: Payer: Self-pay | Admitting: *Deleted

## 2013-10-13 MED ORDER — LEVOTHYROXINE SODIUM 75 MCG PO TABS: 75.0000 ug | ORAL_TABLET | Freq: Every day | ORAL | Status: DC

## 2013-10-13 NOTE — Telephone Encounter (Signed)
tsh is quite improved but we do need to inc dose slightly Please call levothyroxine in  Re check tsh in about 6 weeks

## 2013-10-14 NOTE — Telephone Encounter (Signed)
Left voicemail requesting pt to call office 

## 2013-10-18 MED ORDER — LEVOTHYROXINE SODIUM 75 MCG PO TABS: 75.0000 ug | ORAL_TABLET | Freq: Every day | ORAL | Status: DC

## 2013-10-18 NOTE — Telephone Encounter (Signed)
Pt notified of labs and Dr. Royden Purlower's comments. Rx sent to pharmacy and pt will call back tomorrow to schedule lab appt

## 2013-11-07 ENCOUNTER — Other Ambulatory Visit: Payer: Self-pay | Admitting: Family Medicine

## 2013-12-14 ENCOUNTER — Telehealth: Payer: Self-pay | Admitting: Family Medicine

## 2013-12-14 DIAGNOSIS — E039 Hypothyroidism, unspecified: Secondary | ICD-10-CM

## 2013-12-14 DIAGNOSIS — E119 Type 2 diabetes mellitus without complications: Secondary | ICD-10-CM

## 2013-12-14 NOTE — Telephone Encounter (Signed)
Message copied by Judy PimpleWER, Haille Pardi A on Wed Dec 14, 2013  9:59 PM ------      Message from: Alvina ChouWALSH, TERRI J      Created: Tue Dec 06, 2013  2:12 PM      Regarding: Lab orders for Friday, 7.10.15       Lab orders for a f/u appt ------

## 2013-12-16 ENCOUNTER — Other Ambulatory Visit (INDEPENDENT_AMBULATORY_CARE_PROVIDER_SITE_OTHER): Payer: Medicare Other

## 2013-12-16 DIAGNOSIS — E119 Type 2 diabetes mellitus without complications: Secondary | ICD-10-CM

## 2013-12-16 DIAGNOSIS — E039 Hypothyroidism, unspecified: Secondary | ICD-10-CM

## 2013-12-16 LAB — TSH: TSH: 2.91 u[IU]/mL (ref 0.35–4.50)

## 2013-12-16 LAB — HEMOGLOBIN A1C: HEMOGLOBIN A1C: 8.4 % — AB (ref 4.6–6.5)

## 2013-12-20 ENCOUNTER — Other Ambulatory Visit (INDEPENDENT_AMBULATORY_CARE_PROVIDER_SITE_OTHER): Payer: Medicare Other

## 2013-12-20 DIAGNOSIS — Z1211 Encounter for screening for malignant neoplasm of colon: Secondary | ICD-10-CM

## 2013-12-20 LAB — FECAL OCCULT BLOOD, IMMUNOCHEMICAL: Fecal Occult Bld: NEGATIVE

## 2013-12-22 ENCOUNTER — Encounter: Payer: Self-pay | Admitting: *Deleted

## 2013-12-23 ENCOUNTER — Ambulatory Visit: Payer: Medicare Other | Admitting: Family Medicine

## 2013-12-26 ENCOUNTER — Encounter: Payer: Self-pay | Admitting: Family Medicine

## 2013-12-26 ENCOUNTER — Ambulatory Visit (INDEPENDENT_AMBULATORY_CARE_PROVIDER_SITE_OTHER): Payer: Medicare Other | Admitting: Family Medicine

## 2013-12-26 VITALS — BP 128/78 | HR 97 | Temp 98.3°F | Ht 61.25 in | Wt 159.2 lb

## 2013-12-26 DIAGNOSIS — I10 Essential (primary) hypertension: Secondary | ICD-10-CM

## 2013-12-26 DIAGNOSIS — E119 Type 2 diabetes mellitus without complications: Secondary | ICD-10-CM

## 2013-12-26 DIAGNOSIS — E669 Obesity, unspecified: Secondary | ICD-10-CM

## 2013-12-26 MED ORDER — METFORMIN HCL 1000 MG PO TABS: 1000.0000 mg | ORAL_TABLET | Freq: Two times a day (BID) | ORAL | Status: DC

## 2013-12-26 NOTE — Assessment & Plan Note (Signed)
Improved with better diet and exercise bp in fair control at this time  BP Readings from Last 1 Encounters:  12/26/13 128/78   No changes needed Disc lifstyle change with low sodium diet and exercise

## 2013-12-26 NOTE — Progress Notes (Signed)
Pre visit review using our clinic review tool, if applicable. No additional management support is needed unless otherwise documented below in the visit note. 

## 2013-12-26 NOTE — Assessment & Plan Note (Signed)
Discussed how this problem influences overall health and the risks it imposes  Reviewed plan for weight loss with lower calorie diet (via better food choices and also portion control or program like weight watchers) and exercise building up to or more than 30 minutes 5 days per week including some aerobic activity   She will be able to go to the gym now  Is making progress

## 2013-12-26 NOTE — Progress Notes (Signed)
Subjective:    Patient ID: Amanda King, female    DOB: 02/17/1941, 73 y.o.   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 Encounter for Medicare annual wellness exam 09/22/2013  . Hypersomnia, persistent 07/15/2013  . Foot abrasion 04/09/2012  . Other screening mammogram 12/31/2011  . Colon cancer screening 12/31/2011  . Complicated UTI (urinary tract infection) 11/14/2011  . Obesity 07/01/2011  . ANXIETY 05/06/2010  . DERMATOPHYTOSIS OF NAIL 12/21/2008  . HYPOTHYROIDISM 09/02/2006  . DIABETES MELLITUS, TYPE II 09/02/2006  . HYPERLIPIDEMIA 09/02/2006  . NARCOLEPSY W/CATAPLEXY 09/02/2006  . HYPERTENSION 09/02/2006  . PNEUMONIA, HX OF 09/02/2006   Past Medical History  Diagnosis Date  . Diabetes mellitus     Type II  . Hyperlipidemia   . Hypertension   . Hypothyroidism   . Pneumonia 08/2001  . Narcolepsy with cataplexy   . Migraine    Past Surgical History  Procedure Laterality Date  . Appendectomy  age 60  . Partial hysterectomy  age 8  . Posterior cervical fusion/foraminotomy   02/05/1975    C5-C6-C7  . Hemilaminectomy      C5-6; C6-7   History  Substance Use Topics  . Smoking status: Former Smoker    Quit date: 06/09/1980  . Smokeless tobacco: Never Used  . Alcohol Use: No   Family History  Problem Relation Age of Onset  . Heart disease Mother     MI  . Peripheral vascular disease Mother   . Hyperlipidemia Mother   . Depression Brother     suicide  . Diabetes Brother   . Diabetes Brother   . Sleep apnea Son   . Sleep apnea Brother    Allergies  Allergen Reactions  . Penicillins     REACTION: GI upset   Current Outpatient Prescriptions on File Prior to Visit  Medication Sig Dispense Refill  . aspirin 81 MG tablet Take 81 mg by mouth daily.        . Blood Glucose Monitoring Suppl (BLOOD GLUCOSE MONITOR KIT) KIT Check blood sugar once daily as needed for DM       . glucose blood test strip Check blood sugar once daily as needed for DM.       Marland Kitchen ibuprofen (ADVIL,MOTRIN) 200 MG tablet Take 200 mg by mouth every 6 (six) hours as needed.        Marland Kitchen  imipramine (TOFRANIL) 50 MG tablet Take 1 tablet (50 mg total) by mouth at bedtime.  90 tablet  3  . levothyroxine (SYNTHROID, LEVOTHROID) 75 MCG tablet Take 1 tablet (75 mcg total) by mouth daily.  30 tablet  3  . lisinopril (PRINIVIL,ZESTRIL) 10 MG tablet TAKE 1 TABLET (10 MG TOTAL) BY MOUTH DAILY.  30 tablet  5  . methylphenidate (RITALIN) 20 MG tablet Take 1 tablet (20 mg total) by mouth 4 (four) times daily. Call patient to pick up Rx  180 tablet  0  . simvastatin (ZOCOR) 80 MG tablet TAKE 1 TABLET (80 MG TOTAL) BY MOUTH AT BEDTIME.  30 tablet  5  . ZETIA 10 MG tablet TAKE 1 TABLET BY MOUTH EVERY DAY  30 tablet  5   No current facility-administered medications on file prior to visit.    Review of Systems Review of Systems  Constitutional: Negative for fever, appetite change, and unexpected weight change.  Eyes: Negative for pain and visual disturbance.  Respiratory: Negative for cough and shortness of  breath.   Cardiovascular: Negative for cp or palpitations    Gastrointestinal: Negative for nausea, diarrhea and constipation.  Genitourinary: Negative for urgency and frequency.  Skin: Negative for pallor or rash   Neurological: Negative for weakness, light-headedness, numbness and headaches.  Hematological: Negative for adenopathy. Does not bruise/bleed easily.  Psychiatric/Behavioral: Negative for dysphoric mood. The patient is not nervous/anxious.         Objective:   Physical Exam  Constitutional: She appears well-developed and well-nourished. No distress.  obese and well appearing   HENT:  Head: Normocephalic and atraumatic.  Mouth/Throat: Oropharynx is clear and moist.  Eyes: Conjunctivae and EOM are normal. Pupils are equal, round, and reactive to light. No scleral icterus.  Neck: Normal range of motion. Neck supple. No JVD present. Carotid bruit is not present. No thyromegaly present.  Cardiovascular: Normal rate, regular rhythm, normal heart sounds and intact distal pulses.  Exam reveals no gallop.   Pulmonary/Chest: Effort normal and breath sounds normal. No respiratory distress. She has no wheezes. She has no rales.  Abdominal: Soft. Bowel sounds are normal. She exhibits no distension and no mass. There is no tenderness.  Musculoskeletal: She exhibits no edema and no tenderness.  Lymphadenopathy:    She has no cervical adenopathy.  Neurological: She is alert. She has normal reflexes. No cranial nerve deficit. She exhibits normal muscle tone. Coordination normal.  Skin: Skin is warm and dry. No rash noted. No erythema. No pallor.  Psychiatric: She has a normal mood and affect.          Assessment & Plan:   Problem List Items Addressed This Visit     Cardiovascular and Mediastinum   HYPERTENSION - Primary      Improved with better diet and exercise bp in fair control at this time  BP Readings from Last 1 Encounters:  12/26/13 128/78   No changes needed Disc  lifstyle change with low sodium diet and exercise      Relevant Orders      Lipid panel     Endocrine   DIABETES MELLITUS, TYPE II      Lab Results  Component Value Date   HGBA1C 8.4* 12/16/2013    This is improved Will inc metformin to 1000 bid as tolerated  Keep checking daily glucose Continue diet/ exercise and wt loss Schedule eye exam  F/u 3 mo     Relevant Medications  metFORMIN (GLUCOPHAGE) tablet   Other Relevant Orders      Hemoglobin A1c      Lipid panel     Other   Obesity     Discussed how this problem influences overall health and the risks it imposes  Reviewed plan for weight loss with lower calorie diet (via better food choices and also portion control or program like weight watchers) and exercise building up to or more than 30 minutes 5 days per week including some aerobic activity   She will be able to go to the gym now  Is making progress     Relevant Medications      metFORMIN (GLUCOPHAGE) tablet

## 2013-12-26 NOTE — Patient Instructions (Signed)
I'm glad that weight and glucose control and blood pressure are all improving  Don't forget to have your eye exam Increase metformin to 1000 mg twice daily (that's 2 pills twice daily of your current metformin and then the new px will be 1 pill twice daily) If any problems let me know  Follow up in 3 months with labs prior

## 2013-12-26 NOTE — Assessment & Plan Note (Signed)
Lab Results  Component Value Date   HGBA1C 8.4* 12/16/2013    This is improved Will inc metformin to 1000 bid as tolerated  Keep checking daily glucose Continue diet/ exercise and wt loss Schedule eye exam  F/u 3 mo

## 2014-02-17 ENCOUNTER — Other Ambulatory Visit: Payer: Self-pay | Admitting: Family Medicine

## 2014-03-08 ENCOUNTER — Other Ambulatory Visit: Payer: Self-pay | Admitting: Neurology

## 2014-03-08 DIAGNOSIS — G4710 Hypersomnia, unspecified: Secondary | ICD-10-CM

## 2014-03-08 DIAGNOSIS — G47411 Narcolepsy with cataplexy: Secondary | ICD-10-CM

## 2014-03-08 MED ORDER — METHYLPHENIDATE HCL 20 MG PO TABS: 20.0000 mg | ORAL_TABLET | Freq: Four times a day (QID) | ORAL | Status: DC

## 2014-03-08 NOTE — Telephone Encounter (Signed)
Patient requesting refill of generic Ritalin, please return call and advise.

## 2014-03-08 NOTE — Telephone Encounter (Signed)
Request forwarded to provider for approval  

## 2014-03-10 ENCOUNTER — Telehealth: Payer: Self-pay

## 2014-03-10 NOTE — Telephone Encounter (Signed)
Patient says she is taking Ritalin 20mg  one half four times daily instead of one full tab four times daily as prescribed.  She would like to know if the Rx can be changed to reflect this dose and be written for #180 (90 day supply).  Please advise.  Thank you.

## 2014-03-10 NOTE — Telephone Encounter (Signed)
Called pt to inform her that her Rx was ready to be picked up at the front desk and if she has any other problems, questions or concerns to call the office. Pt verbalized understanding. °

## 2014-03-21 ENCOUNTER — Other Ambulatory Visit (INDEPENDENT_AMBULATORY_CARE_PROVIDER_SITE_OTHER): Payer: Medicare Other

## 2014-03-21 DIAGNOSIS — E119 Type 2 diabetes mellitus without complications: Secondary | ICD-10-CM

## 2014-03-21 DIAGNOSIS — I10 Essential (primary) hypertension: Secondary | ICD-10-CM

## 2014-03-21 LAB — LIPID PANEL
Cholesterol: 127 mg/dL (ref 0–200)
HDL: 33.2 mg/dL — ABNORMAL LOW (ref >39.00)
NONHDL: 93.8
Total CHOL/HDL Ratio: 4
Triglycerides: 236 mg/dL — ABNORMAL HIGH (ref 0.0–149.0)
VLDL: 47.2 mg/dL — ABNORMAL HIGH (ref 0.0–40.0)

## 2014-03-21 LAB — HEMOGLOBIN A1C: HEMOGLOBIN A1C: 7.9 % — AB (ref 4.6–6.5)

## 2014-03-21 LAB — LDL CHOLESTEROL, DIRECT: LDL DIRECT: 63.4 mg/dL

## 2014-03-28 ENCOUNTER — Encounter: Payer: Self-pay | Admitting: Family Medicine

## 2014-03-28 ENCOUNTER — Ambulatory Visit (INDEPENDENT_AMBULATORY_CARE_PROVIDER_SITE_OTHER): Payer: Medicare Other | Admitting: Family Medicine

## 2014-03-28 VITALS — BP 158/84 | HR 101 | Temp 98.2°F | Ht 61.25 in | Wt 162.5 lb

## 2014-03-28 DIAGNOSIS — E1165 Type 2 diabetes mellitus with hyperglycemia: Secondary | ICD-10-CM

## 2014-03-28 DIAGNOSIS — B9689 Other specified bacterial agents as the cause of diseases classified elsewhere: Secondary | ICD-10-CM | POA: Insufficient documentation

## 2014-03-28 DIAGNOSIS — E785 Hyperlipidemia, unspecified: Secondary | ICD-10-CM

## 2014-03-28 DIAGNOSIS — IMO0002 Reserved for concepts with insufficient information to code with codable children: Secondary | ICD-10-CM

## 2014-03-28 DIAGNOSIS — I10 Essential (primary) hypertension: Secondary | ICD-10-CM

## 2014-03-28 DIAGNOSIS — F430 Acute stress reaction: Secondary | ICD-10-CM | POA: Insufficient documentation

## 2014-03-28 DIAGNOSIS — J019 Acute sinusitis, unspecified: Secondary | ICD-10-CM | POA: Insufficient documentation

## 2014-03-28 MED ORDER — LEVOFLOXACIN 500 MG PO TABS: 500.0000 mg | ORAL_TABLET | Freq: Every day | ORAL | Status: DC
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
## 2014-03-28 NOTE — Patient Instructions (Signed)
For the sinus infection - take levaquin as directed  Also continue zyrtec  flonase nasal spray over the counter may be helpful for congestion When you feel better - start exercising Eat a low fat low sugar diet  Don't forget eye appt   Follow up here in 3 months with labs prior

## 2014-03-28 NOTE — Progress Notes (Signed)
Pre visit review using our clinic review tool, if applicable. No additional management support is needed unless otherwise documented below in the visit note. 

## 2014-03-30 NOTE — Assessment & Plan Note (Signed)
Disc goals for lipids and reasons to control them Rev labs with pt Rev low sat fat diet in detail Disc inc HDL with exercise and omega 3 suppl/fish

## 2014-03-30 NOTE — Assessment & Plan Note (Signed)
bp is up today - pt suspects due to feeling poorly and anxiety Will continue to monitor BP: 158/84 mmHg

## 2014-03-30 NOTE — Assessment & Plan Note (Signed)
Family issues Reviewed stressors/ coping techniques/symptoms/ support sources/ tx options and side effects in detail today Offered counseling referral - it would be beneficial  She declines for now

## 2014-03-30 NOTE — Assessment & Plan Note (Signed)
Causing pain/ malaise and drip with nausea tx with levaquin  Fluids/rest Disc symptomatic care - see instructions on AVS  Update if not starting to improve in a week or if worsening

## 2014-03-30 NOTE — Assessment & Plan Note (Signed)
Lab Results  Component Value Date   HGBA1C 7.9* 03/21/2014    This is improving  Rev diet/ exercise / medication

## 2014-04-12 ENCOUNTER — Other Ambulatory Visit: Payer: Self-pay | Admitting: Family Medicine

## 2014-04-14 ENCOUNTER — Other Ambulatory Visit: Payer: Self-pay | Admitting: Family Medicine

## 2014-05-16 ENCOUNTER — Other Ambulatory Visit: Payer: Self-pay | Admitting: *Deleted

## 2014-05-16 MED ORDER — LEVOTHYROXINE SODIUM 75 MCG PO TABS: ORAL_TABLET | ORAL | Status: DC

## 2014-05-16 MED ORDER — LISINOPRIL 10 MG PO TABS: ORAL_TABLET | ORAL | Status: DC

## 2014-05-16 MED ORDER — SIMVASTATIN 80 MG PO TABS: ORAL_TABLET | ORAL | Status: DC

## 2014-06-15 ENCOUNTER — Other Ambulatory Visit: Payer: Self-pay | Admitting: Family Medicine

## 2014-06-19 ENCOUNTER — Telehealth: Payer: Self-pay | Admitting: Family Medicine

## 2014-06-19 DIAGNOSIS — IMO0002 Reserved for concepts with insufficient information to code with codable children: Secondary | ICD-10-CM

## 2014-06-19 DIAGNOSIS — E1165 Type 2 diabetes mellitus with hyperglycemia: Secondary | ICD-10-CM

## 2014-06-19 DIAGNOSIS — E039 Hypothyroidism, unspecified: Secondary | ICD-10-CM

## 2014-06-19 DIAGNOSIS — E785 Hyperlipidemia, unspecified: Secondary | ICD-10-CM

## 2014-06-19 NOTE — Telephone Encounter (Signed)
-----   Message from Alvina Chouerri J Walsh sent at 06/14/2014  3:37 PM EST ----- Regarding: Lab orders for Tuesday, 1.12.16 Lab orders for 3 month f/u

## 2014-06-21 ENCOUNTER — Other Ambulatory Visit (INDEPENDENT_AMBULATORY_CARE_PROVIDER_SITE_OTHER): Payer: Medicare Other

## 2014-06-21 DIAGNOSIS — IMO0002 Reserved for concepts with insufficient information to code with codable children: Secondary | ICD-10-CM

## 2014-06-21 DIAGNOSIS — E785 Hyperlipidemia, unspecified: Secondary | ICD-10-CM

## 2014-06-21 DIAGNOSIS — E1165 Type 2 diabetes mellitus with hyperglycemia: Secondary | ICD-10-CM

## 2014-06-21 LAB — HEMOGLOBIN A1C: HEMOGLOBIN A1C: 8.6 % — AB (ref 4.6–6.5)

## 2014-06-21 LAB — LIPID PANEL
CHOLESTEROL: 151 mg/dL (ref 0–200)
HDL: 40.1 mg/dL (ref >39.00)
LDL Cholesterol: 73 mg/dL (ref 0–99)
NonHDL: 110.9
Total CHOL/HDL Ratio: 4
Triglycerides: 190 mg/dL — ABNORMAL HIGH (ref 0.0–149.0)
VLDL: 38 mg/dL (ref 0.0–40.0)

## 2014-06-21 LAB — HM DIABETES EYE EXAM

## 2014-06-26 ENCOUNTER — Encounter: Payer: Self-pay | Admitting: Family Medicine

## 2014-06-28 ENCOUNTER — Ambulatory Visit (INDEPENDENT_AMBULATORY_CARE_PROVIDER_SITE_OTHER): Payer: Medicare Other | Admitting: Family Medicine

## 2014-06-28 ENCOUNTER — Encounter: Payer: Self-pay | Admitting: Family Medicine

## 2014-06-28 VITALS — BP 156/88 | HR 98 | Temp 97.5°F | Ht 61.0 in | Wt 164.8 lb

## 2014-06-28 DIAGNOSIS — E1165 Type 2 diabetes mellitus with hyperglycemia: Secondary | ICD-10-CM

## 2014-06-28 DIAGNOSIS — E785 Hyperlipidemia, unspecified: Secondary | ICD-10-CM

## 2014-06-28 DIAGNOSIS — I10 Essential (primary) hypertension: Secondary | ICD-10-CM

## 2014-06-28 DIAGNOSIS — IMO0002 Reserved for concepts with insufficient information to code with codable children: Secondary | ICD-10-CM

## 2014-06-28 MED ORDER — EZETIMIBE 10 MG PO TABS: 10.0000 mg | ORAL_TABLET | Freq: Every day | ORAL | Status: DC

## 2014-06-28 MED ORDER — GLIPIZIDE ER 5 MG PO TB24: 5.0000 mg | ORAL_TABLET | Freq: Every day | ORAL | Status: DC

## 2014-06-28 MED ORDER — LISINOPRIL 20 MG PO TABS: 20.0000 mg | ORAL_TABLET | Freq: Every day | ORAL | Status: DC

## 2014-06-28 NOTE — Progress Notes (Signed)
Pre visit review using our clinic review tool, if applicable. No additional management support is needed unless otherwise documented below in the visit note. 

## 2014-06-28 NOTE — Progress Notes (Signed)
 Subjective:    Patient ID: Amanda King, female    DOB: 10/19/1940, 74 y.o.   MRN: [id number]  HPI Here for f/u of chronic medical problems   Feels about the same  Her sinuses chronically bother her   bp is not at goal today  No cp or palpitations or headaches or edema  No side effects to medicines - has not missed any does of lisinopril  BP Readings from Last 3 Encounters:  06/28/14 156/88  03/28/14 158/84  12/26/13 128/78     Wt is up 2 lb with bmi of 31 Very little exercise - "is just lazy"- she could walk  Eating not good during the holidays   Diabetes Home sugar results - started checking her glucose 2 hours after eating - she eats oatmeal (instant)  She did eat more during the holidays (her son cooked) No hypoglycemia   140s-200s after meals  DM diet "I have really tried"  Exercise -no quite enough  Symptoms-none  A1C last  Lab Results  Component Value Date   HGBA1C 8.6* 06/21/2014  this is up from 7.9   No problems with medications -on metformin She has done DM teaching and still has the resource materials - thinks she has a good understanding  Renal protection on ace  Last eye exam   Hyperlipidemia On zocor 80 On zetia  Lab Results  Component Value Date   CHOL 151 06/21/2014   CHOL 127 03/21/2014   CHOL 129 09/14/2013   Lab Results  Component Value Date   HDL 40.10 06/21/2014   HDL 33.20* 03/21/2014   HDL 41.00 09/14/2013   Lab Results  Component Value Date   LDLCALC 73 06/21/2014   LDLCALC 49 09/14/2013   LDLCALC 51 12/31/2011   Lab Results  Component Value Date   TRIG 190.0* 06/21/2014   TRIG 236.0* 03/21/2014   TRIG 195.0* 09/14/2013   Lab Results  Component Value Date   CHOLHDL 4 06/21/2014   CHOLHDL 4 03/21/2014   CHOLHDL 3 09/14/2013   Lab Results  Component Value Date   LDLDIRECT 63.4 03/21/2014   LDLDIRECT 79.7 06/06/2010   LDLDIRECT 172.3 04/29/2010      Patient Active Problem List   Diagnosis Date Noted  . Acute  bacterial sinusitis 03/28/2014  . Stress reaction 03/28/2014  . Encounter for Medicare annual wellness exam 09/22/2013  . Hypersomnia, persistent 07/15/2013  . Foot abrasion 04/09/2012  . Other screening mammogram 12/31/2011  . Colon cancer screening 12/31/2011  . Complicated UTI (urinary tract infection) 11/14/2011  . Obesity 07/01/2011  . ANXIETY 05/06/2010  . DERMATOPHYTOSIS OF NAIL 12/21/2008  . Hypothyroidism 09/02/2006  . Diabetes type 2, uncontrolled 09/02/2006  . Hyperlipidemia LDL goal <100 09/02/2006  . NARCOLEPSY W/CATAPLEXY 09/02/2006  . Essential hypertension 09/02/2006  . PNEUMONIA, HX OF 09/02/2006   Past Medical History  Diagnosis Date  . Diabetes mellitus     Type II  . Hyperlipidemia   . Hypertension   . Hypothyroidism   . Pneumonia 08/2001  . Narcolepsy with cataplexy   . Migraine    Past Surgical History  Procedure Laterality Date  . Appendectomy  age 6  . Partial hysterectomy  age 29  . Posterior cervical fusion/foraminotomy  02/05/1975    C5-C6-C7  . Hemilaminectomy      C5-6; C6-7   History  Substance Use Topics  . Smoking status: Former Smoker    Quit date: 06/09/1980  . Smokeless tobacco: Never Used  .   Alcohol Use: No   Family History  Problem Relation Age of Onset  . Heart disease Mother     MI  . Peripheral vascular disease Mother   . Hyperlipidemia Mother   . Depression Brother     suicide  . Diabetes Brother   . Diabetes Brother   . Sleep apnea Son   . Sleep apnea Brother    Allergies  Allergen Reactions  . Penicillins     REACTION: GI upset   Current Outpatient Prescriptions on File Prior to Visit  Medication Sig Dispense Refill  . aspirin 81 MG tablet Take 81 mg by mouth daily.      . Blood Glucose Monitoring Suppl (BLOOD GLUCOSE MONITOR KIT) KIT Check blood sugar once daily as needed for DM     . glucose blood test strip Check blood sugar once daily as needed for DM.     . ibuprofen (ADVIL,MOTRIN) 200 MG tablet Take 200  mg by mouth every 6 (six) hours as needed.      . imipramine (TOFRANIL) 50 MG tablet Take 1 tablet (50 mg total) by mouth at bedtime. 90 tablet 3  . levothyroxine (SYNTHROID, LEVOTHROID) 75 MCG tablet TAKE 1 TABLET (75 MCG TOTAL) BY MOUTH DAILY. 90 tablet 1  . metFORMIN (GLUCOPHAGE) 1000 MG tablet Take 1 tablet (1,000 mg total) by mouth 2 (two) times daily with a meal. 180 tablet 3  . methylphenidate (RITALIN) 20 MG tablet Take 1 tablet (20 mg total) by mouth 4 (four) times daily. 360 tablet 0  . simvastatin (ZOCOR) 80 MG tablet TAKE 1 TABLET (80 MG TOTAL) BY MOUTH AT BEDTIME. 90 tablet 1   No current facility-administered medications on file prior to visit.    Review of Systems Review of Systems  Constitutional: Negative for fever, appetite change, fatigue and unexpected weight change.  Eyes: Negative for pain and visual disturbance.  ENT pos for intermittent sinus congestion  Respiratory: Negative for cough and shortness of breath.   Cardiovascular: Negative for cp or palpitations    Gastrointestinal: Negative for nausea, diarrhea and constipation.  Genitourinary: Negative for urgency and frequency.  Skin: Negative for pallor or rash   Neurological: Negative for weakness, light-headedness, numbness and headaches.  Hematological: Negative for adenopathy. Does not bruise/bleed easily.  Psychiatric/Behavioral: Negative for dysphoric mood. The patient is not nervous/anxious.         Objective:   Physical Exam  Constitutional: She appears well-developed and well-nourished. No distress.  obese and well appearing   HENT:  Head: Normocephalic and atraumatic.  Right Ear: External ear normal.  Left Ear: External ear normal.  Nose: Nose normal.  Mouth/Throat: Oropharynx is clear and moist.  Eyes: Conjunctivae and EOM are normal. Pupils are equal, round, and reactive to light. Right eye exhibits no discharge. Left eye exhibits no discharge. No scleral icterus.  Neck: Normal range of motion.  Neck supple. No JVD present. Carotid bruit is not present. No thyromegaly present.  Cardiovascular: Normal rate, regular rhythm, normal heart sounds and intact distal pulses.  Exam reveals no gallop.   Pulmonary/Chest: Effort normal and breath sounds normal. No respiratory distress. She has no wheezes. She has no rales.  Musculoskeletal: She exhibits no edema or tenderness.  Lymphadenopathy:    She has no cervical adenopathy.  Neurological: She is alert. She has normal reflexes. No cranial nerve deficit. She exhibits normal muscle tone. Coordination normal.  Skin: Skin is warm and dry. No rash noted. No erythema. No pallor.  Psychiatric:   She has a normal mood and affect.          Assessment & Plan:   Problem List Items Addressed This Visit      Cardiovascular and Mediastinum   Essential hypertension - Primary    Not opt control  Inc lisinopril to 20 mg daily Disc lifestyle change and red sodium diet  F/u 3 mo      Relevant Medications   lisinopril (PRINIVIL,ZESTRIL) tablet   ezetimibe (ZETIA) 10 MG tablet     Other   Diabetes type 2, uncontrolled    Lab Results  Component Value Date   HGBA1C 8.6* 06/21/2014   This is up  Add glipizide er 5 mg with warning to watch for low sugar/ rev symptoms Disc DM diet F/u 3 mo with lab prior  Will start exercising daily also       Relevant Medications   lisinopril (PRINIVIL,ZESTRIL) tablet   glipiZIDE (GLUCOTROL XL) 24 hr tablet   Hyperlipidemia LDL goal <100    Stable on statin and diet Disc goals for lipids and reasons to control them Rev labs with pt Rev low sat fat diet in detail       Relevant Medications   lisinopril (PRINIVIL,ZESTRIL) tablet   ezetimibe (ZETIA) 10 MG tablet      

## 2014-06-28 NOTE — Assessment & Plan Note (Signed)
Stable on statin and diet Disc goals for lipids and reasons to control them Rev labs with pt Rev low sat fat diet in detail

## 2014-06-28 NOTE — Assessment & Plan Note (Signed)
Not opt control  Inc lisinopril to 20 mg daily Disc lifestyle change and red sodium diet  F/u 3 mo

## 2014-06-28 NOTE — Patient Instructions (Signed)
Stick to a diabetic diet  Try to exercise at least 30 minutes per day  Increase lisinopril from 10 to 20 mg once daily -that is for blood pressure Add glipizide for diabetes - if low blood sugars stop it and let me know  Continue metformin   Follow up in 3 months with labs prior

## 2014-06-28 NOTE — Assessment & Plan Note (Signed)
Lab Results  Component Value Date   HGBA1C 8.6* 06/21/2014   This is up  Add glipizide er 5 mg with warning to watch for low sugar/ rev symptoms Disc DM diet F/u 3 mo with lab prior  Will start exercising daily also

## 2014-08-15 ENCOUNTER — Ambulatory Visit (INDEPENDENT_AMBULATORY_CARE_PROVIDER_SITE_OTHER): Payer: Medicare Other | Admitting: Family Medicine

## 2014-08-15 ENCOUNTER — Encounter: Payer: Self-pay | Admitting: Family Medicine

## 2014-08-15 VITALS — BP 148/84 | HR 103 | Temp 98.2°F | Ht 61.0 in | Wt 165.8 lb

## 2014-08-15 DIAGNOSIS — J0101 Acute recurrent maxillary sinusitis: Secondary | ICD-10-CM

## 2014-08-15 DIAGNOSIS — J019 Acute sinusitis, unspecified: Secondary | ICD-10-CM | POA: Insufficient documentation

## 2014-08-15 MED ORDER — LEVOFLOXACIN 500 MG PO TABS: 500.0000 mg | ORAL_TABLET | Freq: Every day | ORAL | Status: DC

## 2014-08-15 MED ORDER — PREDNISONE 10 MG PO TABS: ORAL_TABLET | ORAL | Status: DC

## 2014-08-15 NOTE — Assessment & Plan Note (Signed)
Recurrent/bacterial and maxillary Cover with levaquin Low dose pred taper (warned about blood sugar and other side eff)  Change antihistamines Try nasal /sinus rinse If not improved-consider ENT ref

## 2014-08-15 NOTE — Progress Notes (Signed)
Pre visit review using our clinic review tool, if applicable. No additional management support is needed unless otherwise documented below in the visit note. 

## 2014-08-15 NOTE — Progress Notes (Signed)
Subjective:    Patient ID: Amanda King, female    DOB: 09-09-1940, 74 y.o.   MRN: [id number]  HPI Here for sinus symptoms  Ongoing - getting worse with time   mucinex and zyrtec do not help  N/v from post nasal drip that is severe  Worse after being out in the yard   No fever , but has maxillary pain  Is uncomfortable to wear upper plate Green mucous   Last tx with abx in the fall -it helped   Given zpack - at [facility]   Patient Active Problem List   Diagnosis Date Noted  . Acute bacterial sinusitis 03/28/2014  . Stress reaction 03/28/2014  . Encounter for Medicare annual wellness exam 09/22/2013  . Hypersomnia, persistent 07/15/2013  . Foot abrasion 04/09/2012  . Other screening mammogram 12/31/2011  . Colon cancer screening 12/31/2011  . Complicated UTI (urinary tract infection) 11/14/2011  . Obesity 07/01/2011  . ANXIETY 05/06/2010  . DERMATOPHYTOSIS OF NAIL 12/21/2008  . Hypothyroidism 09/02/2006  . Diabetes type 2, uncontrolled 09/02/2006  . Hyperlipidemia LDL goal <100 09/02/2006  . NARCOLEPSY W/CATAPLEXY 09/02/2006  . Essential hypertension 09/02/2006  . PNEUMONIA, HX OF 09/02/2006   Past Medical History  Diagnosis Date  . Diabetes mellitus     Type II  . Hyperlipidemia   . Hypertension   . Hypothyroidism   . Pneumonia 08/2001  . Narcolepsy with cataplexy   . Migraine    Past Surgical History  Procedure Laterality Date  . Appendectomy  age 63  . Partial hysterectomy  age 84  . Posterior cervical fusion/foraminotomy  02/05/1975    C5-C6-C7  . Hemilaminectomy      C5-6; C6-7   History  Substance Use Topics  . Smoking status: Former Smoker    Quit date: 06/09/1980  . Smokeless tobacco: Never Used  . Alcohol Use: No   Family History  Problem Relation Age of Onset  . Heart disease Mother     MI  . Peripheral vascular disease Mother   . Hyperlipidemia Mother   . Depression Brother     suicide  . Diabetes Brother   . Diabetes Brother   . Sleep  apnea Son   . Sleep apnea Brother    Allergies  Allergen Reactions  . Penicillins     REACTION: GI upset   Current Outpatient Prescriptions on File Prior to Visit  Medication Sig Dispense Refill  . aspirin 81 MG tablet Take 81 mg by mouth daily.      . Blood Glucose Monitoring Suppl (BLOOD GLUCOSE MONITOR KIT) KIT Check blood sugar once daily as needed for DM     . ezetimibe (ZETIA) 10 MG tablet Take 1 tablet (10 mg total) by mouth daily. 30 tablet 11  . glipiZIDE (GLUCOTROL XL) 5 MG 24 hr tablet Take 1 tablet (5 mg total) by mouth daily with breakfast. 30 tablet 3  . glucose blood test strip Check blood sugar once daily as needed for DM.     Marland Kitchen ibuprofen (ADVIL,MOTRIN) 200 MG tablet Take 200 mg by mouth every 6 (six) hours as needed.      Marland Kitchen imipramine (TOFRANIL) 50 MG tablet Take 1 tablet (50 mg total) by mouth at bedtime. 90 tablet 3  . levothyroxine (SYNTHROID, LEVOTHROID) 75 MCG tablet TAKE 1 TABLET (75 MCG TOTAL) BY MOUTH DAILY. 90 tablet 1  . lisinopril (PRINIVIL,ZESTRIL) 20 MG tablet Take 1 tablet (20 mg total) by mouth daily. 30 tablet 3  .  metFORMIN (GLUCOPHAGE) 1000 MG tablet Take 1 tablet (1,000 mg total) by mouth 2 (two) times daily with a meal. 180 tablet 3  . methylphenidate (RITALIN) 20 MG tablet Take 1 tablet (20 mg total) by mouth 4 (four) times daily. 360 tablet 0  . simvastatin (ZOCOR) 80 MG tablet TAKE 1 TABLET (80 MG TOTAL) BY MOUTH AT BEDTIME. 90 tablet 1   No current facility-administered medications on file prior to visit.      Review of Systems Review of Systems  Constitutional: Negative for fever, appetite change,  and unexpected weight change.  ENt pos for cong and rhinorrhea and drip and sinus pain/pressure neg for ear pain  Eyes: Negative for pain and visual disturbance.  Respiratory: Negative for wheeze and shortness of breath.   Cardiovascular: Negative for cp or palpitations    Gastrointestinal: Negative for nausea, diarrhea and constipation.    Genitourinary: Negative for urgency and frequency.  Skin: Negative for pallor or rash   Neurological: Negative for weakness, light-headedness, numbness and headaches.  Hematological: Negative for adenopathy. Does not bruise/bleed easily.  Psychiatric/Behavioral: Negative for dysphoric mood. The patient is not nervous/anxious.         Objective:   Physical Exam  Constitutional: She appears well-developed and well-nourished. No distress.  HENT:  Head: Normocephalic and atraumatic.  Right Ear: External ear normal.  Left Ear: External ear normal.  Mouth/Throat: Oropharynx is clear and moist. No oropharyngeal exudate.  Nares are injected and congested  bilat marked maxillary sinus tenderness Post nasal drip   Eyes: Conjunctivae and EOM are normal. Pupils are equal, round, and reactive to light. Right eye exhibits no discharge. Left eye exhibits no discharge.  Neck: Normal range of motion. Neck supple.  Cardiovascular: Normal rate and regular rhythm.   Pulmonary/Chest: Effort normal and breath sounds normal. No respiratory distress. She has no wheezes. She has no rales.  Musculoskeletal: She exhibits no edema.  Lymphadenopathy:    She has no cervical adenopathy.  Neurological: She is alert. No cranial nerve deficit.  Skin: Skin is warm and dry. No rash noted.  Psychiatric: She has a normal mood and affect.          Assessment & Plan:   Problem List Items Addressed This Visit      Respiratory   Acute sinusitis - Primary    Recurrent/bacterial and maxillary Cover with levaquin Low dose pred taper (warned about blood sugar and other side eff)  Change antihistamines Try nasal /sinus rinse If not improved-consider ENT ref       Relevant Medications   levofloxacin (LEVAQUIN) tablet   predniSONE (DELTASONE) tablet

## 2014-08-15 NOTE — Patient Instructions (Signed)
Try switching zyrtec to allegra or claritin over the counter Buy a sinus rinse kit and try it  Drink lots of water Take levaquin as directed for infection  Use prednisone as directed to help sinus congestion and swelling  If not better after all of this - let me know and we will set you up with a specialist    The prednisone will make you hyper and hungry and sugar will go up

## 2014-09-18 ENCOUNTER — Telehealth: Payer: Self-pay | Admitting: Neurology

## 2014-09-18 DIAGNOSIS — G4710 Hypersomnia, unspecified: Secondary | ICD-10-CM

## 2014-09-18 DIAGNOSIS — G47411 Narcolepsy with cataplexy: Secondary | ICD-10-CM

## 2014-09-18 NOTE — Telephone Encounter (Signed)
Patient called and scheduled appt for 10/31/14 @ 10:45.  FYI

## 2014-09-18 NOTE — Telephone Encounter (Signed)
Patient is called to get written Rx for methylphenidate 20 mg.  Please call.

## 2014-09-18 NOTE — Telephone Encounter (Signed)
Dr Dohmeier is out of the office.  Request forwarded to WID for review.  

## 2014-09-18 NOTE — Telephone Encounter (Signed)
Patient has not been seen in over 1 year.  I called back.  She will check her calendar and call us back to schedule an appt.

## 2014-09-19 MED ORDER — METHYLPHENIDATE HCL 20 MG PO TABS: 20.0000 mg | ORAL_TABLET | Freq: Four times a day (QID) | ORAL | Status: DC

## 2014-09-19 NOTE — Telephone Encounter (Signed)
Chart reviewed, last visit with Dr. Berenice Boutonin February 2015, rescheduled for Nov 01 2014  She is taking Ritalin 20 mg 4 times a day, for hyper somnolence t, refilled her prescription 180 tablets with no refill, advised her follow-up appointment,

## 2014-09-28 ENCOUNTER — Other Ambulatory Visit: Payer: Self-pay | Admitting: Neurology

## 2014-09-28 NOTE — Telephone Encounter (Signed)
Patient has appt scheduled

## 2014-10-03 ENCOUNTER — Other Ambulatory Visit (INDEPENDENT_AMBULATORY_CARE_PROVIDER_SITE_OTHER): Payer: Medicare Other

## 2014-10-03 DIAGNOSIS — I10 Essential (primary) hypertension: Secondary | ICD-10-CM

## 2014-10-03 DIAGNOSIS — E1165 Type 2 diabetes mellitus with hyperglycemia: Secondary | ICD-10-CM | POA: Diagnosis not present

## 2014-10-03 DIAGNOSIS — IMO0002 Reserved for concepts with insufficient information to code with codable children: Secondary | ICD-10-CM

## 2014-10-03 LAB — BASIC METABOLIC PANEL
BUN: 17 mg/dL (ref 6–23)
CO2: 29 mEq/L (ref 19–32)
CREATININE: 0.74 mg/dL (ref 0.40–1.20)
Calcium: 9 mg/dL (ref 8.4–10.5)
Chloride: 100 mEq/L (ref 96–112)
GFR: 81.33 mL/min (ref >60.00)
GLUCOSE: 172 mg/dL — AB (ref 70–99)
POTASSIUM: 4.5 meq/L (ref 3.5–5.1)
SODIUM: 138 meq/L (ref 135–145)

## 2014-10-03 LAB — HEMOGLOBIN A1C: Hgb A1c MFr Bld: 7.3 % — ABNORMAL HIGH (ref 4.6–6.5)

## 2014-10-06 ENCOUNTER — Ambulatory Visit (INDEPENDENT_AMBULATORY_CARE_PROVIDER_SITE_OTHER): Payer: Medicare Other | Admitting: Family Medicine

## 2014-10-06 ENCOUNTER — Encounter: Payer: Self-pay | Admitting: Family Medicine

## 2014-10-06 VITALS — BP 144/80 | HR 100 | Temp 98.2°F | Ht 61.0 in | Wt 164.2 lb

## 2014-10-06 DIAGNOSIS — IMO0002 Reserved for concepts with insufficient information to code with codable children: Secondary | ICD-10-CM

## 2014-10-06 DIAGNOSIS — E1165 Type 2 diabetes mellitus with hyperglycemia: Secondary | ICD-10-CM

## 2014-10-06 DIAGNOSIS — I10 Essential (primary) hypertension: Secondary | ICD-10-CM

## 2014-10-06 DIAGNOSIS — E669 Obesity, unspecified: Secondary | ICD-10-CM

## 2014-10-06 MED ORDER — HYDROCHLOROTHIAZIDE 25 MG PO TABS: 25.0000 mg | ORAL_TABLET | Freq: Every day | ORAL | Status: DC

## 2014-10-06 MED ORDER — ZOSTER VACCINE LIVE 19400 UNT/0.65ML ~~LOC~~ SOLR: 0.6500 mL | Freq: Once | SUBCUTANEOUS | Status: DC

## 2014-10-06 NOTE — Progress Notes (Signed)
Subjective:    Patient ID: Amanda King, female    DOB: Aug 30, 1940, 74 y.o.   MRN: [id number]  HPI Here for f/u of chronic problems   Feeling ok in general  Wants to loose wt - obese, no change in wt  She is working on healthier eating with her son and exercise  Also limiting eating out   bp is stable today  No cp or palpitations or headaches or edema  No side effects to medicines  BP Readings from Last 3 Encounters:  10/06/14 144/80  08/15/14 148/84  06/28/14 156/88    Inc lisinopril to 20 mg daily    Diabetes Home sugar results -no low glucose  DM diet - good with that  Exercise - better than it was  Symptoms-none  A1C last  Lab Results  Component Value Date   HGBA1C 7.3* 10/03/2014  down from 8.6 - is thrilled with that  Metformin was causing diarrhea - cut out am dose , also on glipizide  Renal protection on ace  Last eye exam utd     Patient Active Problem List   Diagnosis Date Noted  . Acute sinusitis 08/15/2014  . Acute bacterial sinusitis 03/28/2014  . Stress reaction 03/28/2014  . Encounter for Medicare annual wellness exam 09/22/2013  . Hypersomnia, persistent 07/15/2013  . Foot abrasion 04/09/2012  . Other screening mammogram 12/31/2011  . Colon cancer screening 12/31/2011  . Complicated UTI (urinary tract infection) 11/14/2011  . Obesity 07/01/2011  . ANXIETY 05/06/2010  . DERMATOPHYTOSIS OF NAIL 12/21/2008  . Hypothyroidism 09/02/2006  . Diabetes type 2, uncontrolled 09/02/2006  . Hyperlipidemia LDL goal <100 09/02/2006  . NARCOLEPSY W/CATAPLEXY 09/02/2006  . Essential hypertension 09/02/2006  . PNEUMONIA, HX OF 09/02/2006   Past Medical History  Diagnosis Date  . Diabetes mellitus     Type II  . Hyperlipidemia   . Hypertension   . Hypothyroidism   . Pneumonia 08/2001  . Narcolepsy with cataplexy   . Migraine    Past Surgical History  Procedure Laterality Date  . Appendectomy  age 76  . Partial hysterectomy  age 18  . Posterior  cervical fusion/foraminotomy  02/05/1975    C5-C6-C7  . Hemilaminectomy      C5-6; C6-7   History  Substance Use Topics  . Smoking status: Former Smoker    Quit date: 06/09/1980  . Smokeless tobacco: Never Used  . Alcohol Use: No   Family History  Problem Relation Age of Onset  . Heart disease Mother     MI  . Peripheral vascular disease Mother   . Hyperlipidemia Mother   . Depression Brother     suicide  . Diabetes Brother   . Diabetes Brother   . Sleep apnea Son   . Sleep apnea Brother    Allergies  Allergen Reactions  . Penicillins     REACTION: GI upset   Current Outpatient Prescriptions on File Prior to Visit  Medication Sig Dispense Refill  . aspirin 81 MG tablet Take 81 mg by mouth daily.      . Blood Glucose Monitoring Suppl (BLOOD GLUCOSE MONITOR KIT) KIT Check blood sugar once daily as needed for DM     . ezetimibe (ZETIA) 10 MG tablet Take 1 tablet (10 mg total) by mouth daily. 30 tablet 11  . glipiZIDE (GLUCOTROL XL) 5 MG 24 hr tablet Take 1 tablet (5 mg total) by mouth daily with breakfast. 30 tablet 3  . glucose blood test strip  Check blood sugar once daily as needed for DM.     Marland Kitchen ibuprofen (ADVIL,MOTRIN) 200 MG tablet Take 200 mg by mouth every 6 (six) hours as needed.      Marland Kitchen imipramine (TOFRANIL) 50 MG tablet TAKE 1 TABLET BY MOUTH AT BEDTIME 90 tablet 0  . levothyroxine (SYNTHROID, LEVOTHROID) 75 MCG tablet TAKE 1 TABLET (75 MCG TOTAL) BY MOUTH DAILY. 90 tablet 1  . lisinopril (PRINIVIL,ZESTRIL) 20 MG tablet Take 1 tablet (20 mg total) by mouth daily. 30 tablet 3  . metFORMIN (GLUCOPHAGE) 1000 MG tablet Take 1 tablet (1,000 mg total) by mouth 2 (two) times daily with a meal. (Patient taking differently: Take 1,000 mg by mouth 2 (two) times daily with a meal. Take 1/2 pill in am by mouth and 1 pill in pm) 180 tablet 3  . methylphenidate (RITALIN) 20 MG tablet Take 1 tablet (20 mg total) by mouth 4 (four) times daily. 180 tablet 0  . simvastatin (ZOCOR) 80 MG  tablet TAKE 1 TABLET (80 MG TOTAL) BY MOUTH AT BEDTIME. 90 tablet 1   No current facility-administered medications on file prior to visit.     Review of Systems Review of Systems  Constitutional: Negative for fever, appetite change, fatigue and unexpected weight change.  Eyes: Negative for pain and visual disturbance.  Respiratory: Negative for cough and shortness of breath.   Cardiovascular: Negative for cp or palpitations    Gastrointestinal: Negative for nausea, diarrhea and constipation.  Genitourinary: Negative for urgency and frequency.  Skin: Negative for pallor or rash   Neurological: Negative for weakness, light-headedness, numbness and headaches.  Hematological: Negative for adenopathy. Does not bruise/bleed easily.  Psychiatric/Behavioral: Negative for dysphoric mood. The patient is not nervous/anxious.         Objective:   Physical Exam  Constitutional: She appears well-developed and well-nourished. No distress.  obese and well appearing   HENT:  Head: Normocephalic and atraumatic.  Mouth/Throat: Oropharynx is clear and moist.  Eyes: Conjunctivae and EOM are normal. Pupils are equal, round, and reactive to light.  Neck: Normal range of motion. Neck supple. No JVD present. Carotid bruit is not present. No thyromegaly present.  Cardiovascular: Normal rate, regular rhythm, normal heart sounds and intact distal pulses.  Exam reveals no gallop.   Pulmonary/Chest: Effort normal and breath sounds normal. No respiratory distress. She has no wheezes. She has no rales.  No crackles  Abdominal: Soft. Bowel sounds are normal. She exhibits no distension, no abdominal bruit and no mass. There is no tenderness.  Musculoskeletal: She exhibits no edema.  Lymphadenopathy:    She has no cervical adenopathy.  Neurological: She is alert. She has normal reflexes.  Skin: Skin is warm and dry. No rash noted.  Psychiatric: She has a normal mood and affect.          Assessment & Plan:     Problem List Items Addressed This Visit      Cardiovascular and Mediastinum   Essential hypertension - Primary    bp is still not at goal  Will add hctz 25 mg daily Is on ace  Disc lifestyle change F/u 1 mo       Relevant Medications   hydrochlorothiazide (HYDRODIURIL) 25 MG tablet     Other   Diabetes type 2, uncontrolled    Lab Results  Component Value Date   HGBA1C 7.3* 10/03/2014   Down from 8.6 Doing well with glipizide -and had to cut her am dose of metformin due  to diarrhea - will try 500 in the am and see how she does Urged to continue good diet and exercise       Obesity    Discussed how this problem influences overall health and the risks it imposes  Reviewed plan for weight loss with lower calorie diet (via better food choices and also portion control or program like weight watchers) and exercise building up to or more than 30 minutes 5 days per week including some aerobic activity

## 2014-10-06 NOTE — Patient Instructions (Signed)
Diabetes is improving  Try the metformin at 1/2 pill in the am and 1 pill in the evening - see how you do  Continue other medicines Add HCTZ 25 mg each am- this is a fluid pill to bring down blood pressure  Follow up with me in about a month for blood pressure (take your medicine that am)   Keep up the good work with diet and exercise

## 2014-10-06 NOTE — Assessment & Plan Note (Signed)
Lab Results  Component Value Date   HGBA1C 7.3* 10/03/2014   Down from 8.6 Doing well with glipizide -and had to cut her am dose of metformin due to diarrhea - will try 500 in the am and see how she does Urged to continue good diet and exercise

## 2014-10-06 NOTE — Progress Notes (Signed)
Pre visit review using our clinic review tool, if applicable. No additional management support is needed unless otherwise documented below in the visit note. 

## 2014-10-06 NOTE — Assessment & Plan Note (Signed)
bp is still not at goal  Will add hctz 25 mg daily Is on ace  Disc lifestyle change F/u 1 mo

## 2014-10-06 NOTE — Assessment & Plan Note (Signed)
Discussed how this problem influences overall health and the risks it imposes  Reviewed plan for weight loss with lower calorie diet (via better food choices and also portion control or program like weight watchers) and exercise building up to or more than 30 minutes 5 days per week including some aerobic activity    

## 2014-10-31 ENCOUNTER — Ambulatory Visit (INDEPENDENT_AMBULATORY_CARE_PROVIDER_SITE_OTHER): Payer: Medicare Other | Admitting: Neurology

## 2014-10-31 ENCOUNTER — Other Ambulatory Visit: Payer: Self-pay | Admitting: Family Medicine

## 2014-10-31 ENCOUNTER — Encounter: Payer: Self-pay | Admitting: Neurology

## 2014-10-31 ENCOUNTER — Telehealth: Payer: Self-pay

## 2014-10-31 VITALS — BP 152/70 | HR 90 | Resp 20 | Ht 62.21 in | Wt 163.0 lb

## 2014-10-31 DIAGNOSIS — G4710 Hypersomnia, unspecified: Secondary | ICD-10-CM | POA: Diagnosis not present

## 2014-10-31 DIAGNOSIS — G47411 Narcolepsy with cataplexy: Secondary | ICD-10-CM

## 2014-10-31 MED ORDER — IMIPRAMINE HCL 50 MG PO TABS: 50.0000 mg | ORAL_TABLET | Freq: Every day | ORAL | Status: DC

## 2014-10-31 MED ORDER — METHYLPHENIDATE HCL 20 MG PO TABS: 20.0000 mg | ORAL_TABLET | Freq: Three times a day (TID) | ORAL | Status: DC

## 2014-10-31 MED ORDER — METHYLPHENIDATE HCL 20 MG PO TABS: 20.0000 mg | ORAL_TABLET | Freq: Four times a day (QID) | ORAL | Status: DC

## 2014-10-31 NOTE — Progress Notes (Signed)
[facility]  Provider:  Larey Seat, M D  Referring Provider: Abner Greenspan, MD Primary Care Physician:  Loura Pardon, MD  Chief Complaint  Patient presents with  . Follow-up    narcolepsy, rm 10, alone    HPI:  Amanda King is a 74 y.o. female seen here as a revisit  from Dr. Glori King for follow up of symptoms of Hypersomnia, RV from 10-31-14 .   I had last seen Amanda King in 2013 after she was referred to me with a diagnosis of narcolepsy with cataplexy.  She had been treated on generic imipramine and Ritalin;   50  mg of imipramine at night , and 20 mg of methylphenidate 4 times daily. In  2013 she described that in spite of the medication she had a cataplectic attack related to the emotionally upsetting news  of her granddaughter's father-in-law's death.  She reported rare sleep hallucinations and had been followed by Dr. Morrell King , Dr. Earley King  and was Dr. Tressia King patient until his retirement.  The patient states that the narcolepsy was diagnosed  after 1989 with cataplectic attacks.;  she was unsure of the was any viral onset and that she has no family history of narcolepsy but at the time underwent a neck surgery with myelogram and somehow  Remained always sleepier since that time.  She reported ongoing cataplectic spells whenever she tried to change dosing of her medication - she had been using Tofranil as the brand name to sleep  but was due to costs and insurance coverage forced to change to generic imipramine, which triggered additional cataplectic attacks.  We have discussed Xyrem in our last visit, but postponed the initiation as she felt that her current regimen was sufficient. The patient initiates bedtime normally at 11 PM, she will fall asleep promptly, times a week Mondays, Wednesdays and Fridays she will rise at 7 AM to go to the gym. Spontaneously she will wake up between 7 and 8 on other days as well. During the day she often takes a nap on her  couch, about 45 minutes duration that she feels is refreshing. The nocturnal sleep time is estimated to be around 8 hours as well. He can be fragmented but visit dreams but he denied any nightmares or any "acting out "of dreams. She is very emotional at funerals and had multiple cataplectic spells in that setting.  The patient just retired from [employer]. after 24 years and is now rearranging her day-to-day life. 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  She isn't from a smoker but quit over 32 years ago and she has not used alcohol or recreational drugs. She would drink up to 3 cups of caffeinated beverages per day.   Rv 10-31-14; Amanda King is here today for which he revisit and refills. She reports doing fine, he had no paralyzing spells recently no nightmares, no dream intrusion. I asked her about cataplexy and she said it right now she seems to be free of so spells. There is that the only time she experiences cataplexy now is when she is really upsets or last time she was at a funeral and grieving. She endorsed today the Epworth sleepiness score at 12 points down from 13 and the fatigue severity score at 12 points.  Review of Systems: Out of a complete 14 system review, the patient complains of only the following symptoms, and all other reviewed systems are negative. The patient endorsed the fatigue severity scale at 12 , and the Epworth Sleepiness Scale at 12 points , and the geriatric depression score at 0 point. She has significant loss of hearing and wears hearing aids bilaterally.  She doesn't exercise. Her son just installed some exercise equipment in her upstairs bedroom.   History   Social History  . Marital Status: Widowed    Spouse Name: N/A  . Number of Children: 3  . Years of Education: GED   Occupational History  .      Social History Main Topics  . Smoking status: Former Smoker    Quit date: 06/09/1980  . Smokeless tobacco: Never Used  . Alcohol Use: No  . Drug Use: No  . Sexual Activity: Not on file   Other Topics Concern  . Not on file   Social History Narrative   Patient is widowed. and her son lives with her.   Walks for exercise.     Patient has three children.   Patient is right-handed.   Patient drinks three cups of caffeine daily.     Patient is retired.   Patient has a GED.    Family History  Problem Relation Age of Onset  . Heart disease Mother     MI  . Peripheral vascular disease Mother   . Hyperlipidemia Mother   . Depression Brother     suicide  . Diabetes Brother   . Diabetes Brother   . Sleep apnea Son   . Sleep apnea Brother     Past Medical History  Diagnosis Date  . Diabetes mellitus     Type II  . Hyperlipidemia   . Hypertension   . Hypothyroidism   . Pneumonia 08/2001  . Narcolepsy with cataplexy   . Migraine     Past Surgical History  Procedure Laterality Date  . Appendectomy  age 57  . Partial hysterectomy  age 6  . Posterior cervical fusion/foraminotomy  02/05/1975    C5-C6-C7  . Hemilaminectomy      C5-6; C6-7    Current Outpatient Prescriptions  Medication Sig Dispense Refill  . aspirin 81 MG tablet Take 81 mg by mouth daily.      . Blood Glucose Monitoring Suppl (BLOOD GLUCOSE MONITOR KIT) KIT Check blood sugar once daily as needed for DM     . ezetimibe (ZETIA) 10 MG tablet Take 1 tablet (10 mg total) by mouth daily. 30 tablet 11  . glipiZIDE (GLUCOTROL XL) 5 MG 24 hr tablet Take 1 tablet (5 mg total) by mouth daily with breakfast. 30 tablet 3  . glucose blood test strip Check blood sugar once daily as needed for DM.     . hydrochlorothiazide (HYDRODIURIL) 25 MG tablet Take 1 tablet (25 mg total) by mouth daily. 30 tablet 5  . ibuprofen (ADVIL,MOTRIN) 200 MG tablet Take 200 mg by mouth every 6 (six) hours as needed.      Marland Kitchen imipramine  (TOFRANIL) 50 MG tablet TAKE 1 TABLET BY MOUTH AT BEDTIME 90 tablet 0  . levothyroxine (SYNTHROID, LEVOTHROID) 75 MCG tablet TAKE 1 TABLET (75 MCG TOTAL) BY MOUTH DAILY. 90 tablet 1  . lisinopril (PRINIVIL,ZESTRIL) 20 MG tablet Take 1 tablet (20 mg total) by mouth daily. 30 tablet 3  . metFORMIN (GLUCOPHAGE) 1000 MG tablet Take 1 tablet (1,000 mg total) by  mouth 2 (two) times daily with a meal. (Patient taking differently: Take 1,000 mg by mouth 2 (two) times daily with a meal. Take 1/2 pill in am by mouth and 1 pill in pm) 180 tablet 3  . methylphenidate (RITALIN) 20 MG tablet Take 1 tablet (20 mg total) by mouth 4 (four) times daily. 180 tablet 0  . zoster vaccine live, PF, (ZOSTAVAX) 67893 UNT/0.65ML injection Inject 19,400 Units into the skin once. 1 vial 0  . simvastatin (ZOCOR) 80 MG tablet TAKE 1 TABLET (80 MG TOTAL) BY MOUTH AT BEDTIME. (Patient not taking: Reported on 10/31/2014) 90 tablet 1   No current facility-administered medications for this visit.    Allergies as of 10/31/2014 - Review Complete 10/31/2014  Allergen Reaction Noted  . Penicillins  04/02/2007    Vitals: BP 152/70 mmHg  Pulse 90  Resp 20  Ht 5' 2.21" (1.58 m)  Wt 163 lb (73.936 kg)  BMI 29.62 kg/m2 Last Weight:  Wt Readings from Last 1 Encounters:  10/31/14 163 lb (73.936 kg)   Last Height:   Ht Readings from Last 1 Encounters:  10/31/14 5' 2.21" (1.58 m)   Physical exam: stable weight for years,   General: The patient is awake, alert and appears not in acute distress. The patient is well groomed. Head: Normocephalic, atraumatic. Neck is supple. Mallampati 3 , neck circumference:15.25 . Cardiovascular:  Regular rate and rhythm , without  murmurs or carotid bruit, and without distended neck veins. Respiratory: Lungs are clear to auscultation. Skin:  Without evidence of edema, or rash Trunk: BMI is  elevated and patient  has normal posture.  Neurologic exam : The patient is awake and alert, oriented  to place and time.  Memory subjective  described as intact.  There is a normal attention span & concentration ability.  Speech is fluent without dysarthria, dysphonia noted, not aphasia. Mood and affect are appropriate.  Cranial nerves: Pupils are equal and briskly reactive to light. Funduscopic exam without  evidence of pallor or edema.  Extraocular movements  in vertical and horizontal planes intact and without nystagmus. Visual fields by finger perimetry are intact. Hearing to finger rub intact.  Facial sensation intact to fine touch.  Facial motor strength is symmetric and tongue and uvula move midline.  Motor exam:   Normal tone and normal muscle bulk and symmetric normal strength in all extremities.  Sensory:  Fine touch, pinprick and vibration were tested in all extremities. Proprioception is tested in the upper extremities only. This was  normal.  Coordination: Rapid alternating movements in the fingers/hands is tested and normal. Finger-to-nose maneuver tested and normal without evidence of ataxia, dysmetria or tremor.  Gait and station: Patient walks without assistive device and is able and assisted stool climb up to the exam table. Strength within normal limits. Stance is stable and normal. Tandem gait is unfragmented. Romberg testing is normal.  Deep tendon reflexes: in the  upper and lower extremities are symmetric and intact.  Babinski maneuver response is downgoing.   Assessment:  After physical and neurologic examination, review of laboratory studies, imaging, neurophysiology testing and this patient carries the diagnosis of cataplexy and narcolepsy in 1984, after suffering severe cataplectic attacks.   She had migraines frequently which Dr. Fernanda Drum addressed.   Sleep hallucinations are rare now.  There is no longer access to the original sleep studies. I will order a repeat PSG and MSLT.  The patient noticed that she had withdrawal and insomnia when her Tofranil was  not  refilled on time. It will be difficult for her to wean but we will make an attempt. I would like her to go 7 days on hold the dose of Tofranil and Ritalin that she takes currently and if this. Can be sustained without significant physical was draws symptoms or if her symptoms basically are hypersomnia I would like her to go 7 days without. The sleep study and MS LT have to follow after this washout. Of at least 7 days. The patient understands. She is here again concerned about the costs.    Plan:  Treatment plan and additional workup :  HLA testing for narcolepsy with cataplexy, clarify with medicare if they cover this test , first.  .  refill Tofranil,  Ritalin, 4 times daily. She has never been on Xyrem. I will ask Janett [employer]ow banks to inqiere about costs.   She is not on pain medication, she denies any asthma or COPD history .     Dirk Vanaman, MD

## 2014-10-31 NOTE — Patient Instructions (Signed)
I will prescribe the following weaning schedule for the patient. I would like her to take Ritalin which she currently takes 4 times a day at 10 mg, twice a day 10 mg for 7 days and then to discontinue it until MS LT and PSG testing time at least for 1 week. I also want her to do the same with imipramine she takes currently 1 dose of imipramine in form of Tofranil once a day. I would like for this medication to go to every other night for 8 days and then discontinue it. Those medications are sleep and REM sleep suppressant. An MS LT and PSG will not be valid if the patient cannot wean and wash off. If she should not be able to sustain this. She is to give [place] a call and we will counsel the sleep studies. I will order an HLA narcolepsy test today.  Polysomnography (Sleep Studies) Polysomnography (PSG) is a series of tests used for detecting (diagnosing) obstructive sleep apnea and other sleep disorders. The tests measure how some parts of your body are working while you are sleeping. The tests are extensive and expensive. They are done in a sleep lab or hospital, and vary from center to center. Your caregiver may perform other more simple sleep studies and questionnaires before doing more complete and involved testing. Testing may not be covered by insurance. Some of these tests are:  An EEG (Electroencephalogram). This tests your brain waves and stages of sleep.  An EOG (Electrooculogram). This measures the movements of your eyes. It detects periods of REM (rapid eye movement) sleep, which is your dream sleep.  An EKG (Electrocardiogram). This measures your heart rhythm.  EMG (Electromyography). This is a measurement of how the muscles are working in your upper airway and your legs while sleeping.  An oximetry measurement. It measures how much oxygen (air) you are getting while sleeping.  Breathing efforts may be measured. The same test can be interpreted (understood) differently by different  caregivers and centers that study sleep.  Studies may be given an apnea/hypopnea index (AHI). This is a number which is found by counting the times of no breathing or under breathing during the night, and relating those numbers to the amount of time spent in bed. When the AHI is greater than 15, the patient is likely to complain of daytime sleepiness. When the AHI is greater than 30, the patient is at increased risk for heart problems and must be followed more closely. Following the AHI also allows you to know how treatment is working. Simple oximetry (tracking the amount of oxygen that is taken in) can be used for screening patients who:  Do not have symptoms (problems) of OSA.  Have a normal Epworth Sleepiness Scale Score.  Have a low pre-test probability of having OSA.  Have none of the upper airway problems likely to cause apnea.  Oximetry is also used to determine if treatment is effective in patients who showed significant desaturations (not getting enough oxygen) on their home sleep study. One extra measure of safety is to perform additional studies for the person who only snores. This is because no one can predict with absolute certainty who will have OSA. Those who show significant desaturations (not getting enough oxygen) are recommended to have a more detailed sleep study. Document Released: 11/30/2002 Document Revised: 08/18/2011 Document Reviewed: 08/01/2013 [facility] Patient Information 2015 [place], [place]. This information is not intended to replace advice given to you by your health care provider. Make sure  you discuss any questions you have with your health care provider.

## 2014-10-31 NOTE — Telephone Encounter (Signed)
Called [employer] and [facility] to find out the cost of the HLA typing test for pt. [employer] informed us that the pt is responsible for 20% of the cost of the radiology or diagnostic test. [facility] told us that the cost of the test is $389.00. 20% of $389.00 is about $80.00 out of pocket for the pt. Called pt to tell her about what [place] said, and that 20% of the cost of the test is about $80.00. She said this is ok and to please go ahead and send in her blood for the HLA typing.

## 2014-10-31 NOTE — Addendum Note (Signed)
Addended by: Melvyn NovasHMEIER, Adell Panek on: 10/31/2014 11:10 AM   Modules accepted: Orders, Medications

## 2014-11-07 ENCOUNTER — Encounter: Payer: Self-pay | Admitting: Family Medicine

## 2014-11-07 ENCOUNTER — Ambulatory Visit (INDEPENDENT_AMBULATORY_CARE_PROVIDER_SITE_OTHER): Payer: Medicare Other | Admitting: Family Medicine

## 2014-11-07 VITALS — BP 130/80 | HR 100 | Temp 98.7°F | Ht 62.25 in | Wt 163.5 lb

## 2014-11-07 DIAGNOSIS — I10 Essential (primary) hypertension: Secondary | ICD-10-CM | POA: Diagnosis not present

## 2014-11-07 LAB — BASIC METABOLIC PANEL
BUN: 18 mg/dL (ref 6–23)
CALCIUM: 9 mg/dL (ref 8.4–10.5)
CO2: 29 mEq/L (ref 19–32)
Chloride: 99 mEq/L (ref 96–112)
Creatinine, Ser: 0.83 mg/dL (ref 0.40–1.20)
GFR: 71.23 mL/min (ref >60.00)
Glucose, Bld: 185 mg/dL — ABNORMAL HIGH (ref 70–99)
POTASSIUM: 4.2 meq/L (ref 3.5–5.1)
Sodium: 136 mEq/L (ref 135–145)

## 2014-11-07 NOTE — Patient Instructions (Signed)
Blood pressure is better  Continue current medicines  Work on low sodium diet and drink lots of water  Follow up in early August with labs prior  Labs today

## 2014-11-07 NOTE — Progress Notes (Signed)
Subjective:    Patient ID: Amanda King, female    DOB: [birth year], 74 y.o.   MRN: [id number]  HPI Here for f/u of HTN  Last visit added hctz to her ace No problems or side effects  Is urinating more often -tolerates that   No HA or leg swelling or CP  BP Readings from Last 3 Encounters:  11/07/14 142/76  10/31/14 152/70  10/06/14 144/80    Improving overall - better on first check today   No change in wt  bmi is 29   Patient Active Problem List   Diagnosis Date Noted  . Narcolepsy with cataplexy 10/31/2014  . Acute sinusitis 08/15/2014  . Acute bacterial sinusitis 03/28/2014  . Stress reaction 03/28/2014  . Encounter for Medicare annual wellness exam 09/22/2013  . Hypersomnia, persistent 07/15/2013  . Foot abrasion 04/09/2012  . Other screening mammogram 12/31/2011  . Colon cancer screening 12/31/2011  . Complicated UTI (urinary tract infection) 11/14/2011  . Obesity 07/01/2011  . ANXIETY 05/06/2010  . DERMATOPHYTOSIS OF NAIL 12/21/2008  . Hypothyroidism 09/02/2006  . Diabetes type 2, uncontrolled 09/02/2006  . Hyperlipidemia LDL goal <100 09/02/2006  . NARCOLEPSY W/CATAPLEXY 09/02/2006  . Essential hypertension 09/02/2006  . PNEUMONIA, HX OF 09/02/2006   Past Medical History  Diagnosis Date  . Diabetes mellitus     Type II  . Hyperlipidemia   . Hypertension   . Hypothyroidism   . Pneumonia 08/2001  . Narcolepsy with cataplexy   . Migraine    Past Surgical History  Procedure Laterality Date  . Appendectomy  age 46  . Partial hysterectomy  age 40  . Posterior cervical fusion/foraminotomy  02/05/1975    C5-C6-C7  . Hemilaminectomy      C5-6; C6-7   History  Substance Use Topics  . Smoking status: Former Smoker    Quit date: 06/09/1980  . Smokeless tobacco: Never Used  . Alcohol Use: No   Family History  Problem Relation Age of Onset  . Heart disease Mother     MI  . Peripheral vascular disease Mother   . Hyperlipidemia Mother   .  Depression Brother     suicide  . Diabetes Brother   . Diabetes Brother   . Sleep apnea Son   . Sleep apnea Brother    Allergies  Allergen Reactions  . Penicillins     REACTION: GI upset   Current Outpatient Prescriptions on File Prior to Visit  Medication Sig Dispense Refill  . aspirin 81 MG tablet Take 81 mg by mouth daily.      . Blood Glucose Monitoring Suppl (BLOOD GLUCOSE MONITOR KIT) KIT Check blood sugar once daily as needed for DM     . ezetimibe (ZETIA) 10 MG tablet Take 1 tablet (10 mg total) by mouth daily. 30 tablet 11  . glipiZIDE (GLUCOTROL XL) 5 MG 24 hr tablet TAKE 1 TABLET BY MOUTH EVERY DAY WITH BREAKFAST 30 tablet 5  . glucose blood test strip Check blood sugar once daily as needed for DM.     . hydrochlorothiazide (HYDRODIURIL) 25 MG tablet Take 1 tablet (25 mg total) by mouth daily. 30 tablet 5  . ibuprofen (ADVIL,MOTRIN) 200 MG tablet Take 200 mg by mouth every 6 (six) hours as needed.      Marland Kitchen imipramine (TOFRANIL) 50 MG tablet Take 1 tablet (50 mg total) by mouth at bedtime. 90 tablet 3  . levothyroxine (SYNTHROID, LEVOTHROID) 75 MCG tablet TAKE 1 TABLET (75 MCG TOTAL)  BY MOUTH DAILY. 90 tablet 1  . lisinopril (PRINIVIL,ZESTRIL) 20 MG tablet Take 1 tablet (20 mg total) by mouth daily. 30 tablet 3  . metFORMIN (GLUCOPHAGE) 1000 MG tablet Take 1 tablet (1,000 mg total) by mouth 2 (two) times daily with a meal. (Patient taking differently: Take 1,000 mg by mouth 2 (two) times daily with a meal. Take 1/2 pill in am by mouth and 1 pill in pm) 180 tablet 3  . methylphenidate (RITALIN) 20 MG tablet Take 1 tablet (20 mg total) by mouth 3 (three) times daily with meals. 180 tablet 0  . simvastatin (ZOCOR) 80 MG tablet TAKE 1 TABLET (80 MG TOTAL) BY MOUTH AT BEDTIME. 90 tablet 1  . zoster vaccine live, PF, (ZOSTAVAX) 40086 UNT/0.65ML injection Inject 19,400 Units into the skin once. 1 vial 0   No current facility-administered medications on file prior to visit.       Review of Systems Review of Systems  Constitutional: Negative for fever, appetite change, fatigue and unexpected weight change.  Eyes: Negative for pain and visual disturbance.  Respiratory: Negative for cough and shortness of breath.   Cardiovascular: Negative for cp or palpitations    Gastrointestinal: Negative for nausea, diarrhea and constipation.  Genitourinary: Negative for urgency and frequency.  Skin: Negative for pallor or rash   Neurological: Negative for weakness, light-headedness, numbness and headaches.  Hematological: Negative for adenopathy. Does not bruise/bleed easily.  Psychiatric/Behavioral: Negative for dysphoric mood. The patient is not nervous/anxious.         Objective:   Physical Exam  Constitutional: She appears well-developed and well-nourished. No distress.  obese and well appearing   HENT:  Head: Normocephalic and atraumatic.  Mouth/Throat: Oropharynx is clear and moist.  Eyes: Conjunctivae and EOM are normal. Pupils are equal, round, and reactive to light.  Neck: Normal range of motion. Neck supple. No JVD present. Carotid bruit is not present. No thyromegaly present.  Cardiovascular: Normal rate, regular rhythm, normal heart sounds and intact distal pulses.  Exam reveals no gallop.   Pulmonary/Chest: Effort normal and breath sounds normal. No respiratory distress. She has no wheezes. She has no rales.  No crackles  Abdominal: Soft. Bowel sounds are normal. She exhibits no distension, no abdominal bruit and no mass. There is no tenderness.  Musculoskeletal: She exhibits no edema.  Lymphadenopathy:    She has no cervical adenopathy.  Neurological: She is alert. She has normal reflexes.  Skin: Skin is warm and dry. No rash noted.  Psychiatric: She has a normal mood and affect.          Assessment & Plan:   Problem List Items Addressed This Visit    Essential hypertension - Primary    Improved with addn of hctz  bp in fair control at this  time  BP Readings from Last 1 Encounters:  11/07/14 130/80   No changes needed Disc lifstyle change with low sodium diet and exercise   Lab today  Enc wt loss and DASH diet   F/u aug for DM      Relevant Orders   Basic metabolic panel (Completed)

## 2014-11-07 NOTE — Assessment & Plan Note (Signed)
Improved with addn of hctz  bp in fair control at this time  BP Readings from Last 1 Encounters:  11/07/14 130/80   No changes needed Disc lifstyle change with low sodium diet and exercise   Lab today  Enc wt loss and DASH diet   F/u aug for DM

## 2014-11-07 NOTE — Progress Notes (Signed)
Pre visit review using our clinic review tool, if applicable. No additional management support is needed unless otherwise documented below in the visit note. 

## 2014-11-08 ENCOUNTER — Encounter: Payer: Self-pay | Admitting: *Deleted

## 2014-11-09 ENCOUNTER — Telehealth: Payer: Self-pay

## 2014-11-09 NOTE — Telephone Encounter (Signed)
Called pt to inform her that her HLA testing for narcolepsy is positive. No answer, left message asking her to call me back at her convenience.

## 2014-11-13 ENCOUNTER — Telehealth: Payer: Self-pay

## 2014-11-13 NOTE — Telephone Encounter (Signed)
Spoke to pt and told her that her HLA test was positive for narcolepsy. Pt verbalized understanding. Follow up appt was made for 7/3 at 9:30. Pt understands to arrive 15 minutes early.

## 2014-11-13 NOTE — Telephone Encounter (Signed)
Called pt back, it went straight to voicemail, left another message.

## 2014-11-13 NOTE — Telephone Encounter (Signed)
Patient returned call. Please call and advise.  °

## 2014-11-13 NOTE — Telephone Encounter (Signed)
Called pt again to try and give HLA results. No answer, left message asking her to call me back.

## 2014-11-13 NOTE — Telephone Encounter (Signed)
Patient is returning your call. Please call  °

## 2014-11-22 ENCOUNTER — Other Ambulatory Visit: Payer: Self-pay | Admitting: Family Medicine

## 2014-12-19 ENCOUNTER — Other Ambulatory Visit: Payer: Self-pay

## 2014-12-19 DIAGNOSIS — G47411 Narcolepsy with cataplexy: Secondary | ICD-10-CM

## 2014-12-19 DIAGNOSIS — G4710 Hypersomnia, unspecified: Secondary | ICD-10-CM

## 2014-12-20 ENCOUNTER — Telehealth: Payer: Self-pay | Admitting: Neurology

## 2014-12-20 ENCOUNTER — Ambulatory Visit (INDEPENDENT_AMBULATORY_CARE_PROVIDER_SITE_OTHER): Payer: Medicare Other | Admitting: Neurology

## 2014-12-20 ENCOUNTER — Encounter: Payer: Self-pay | Admitting: Neurology

## 2014-12-20 VITALS — BP 146/78 | HR 92 | Resp 20 | Ht 62.21 in | Wt 163.0 lb

## 2014-12-20 DIAGNOSIS — G4710 Hypersomnia, unspecified: Secondary | ICD-10-CM | POA: Diagnosis not present

## 2014-12-20 DIAGNOSIS — G47411 Narcolepsy with cataplexy: Secondary | ICD-10-CM | POA: Diagnosis not present

## 2014-12-20 MED ORDER — IMIPRAMINE HCL 50 MG PO TABS: 50.0000 mg | ORAL_TABLET | Freq: Every day | ORAL | Status: DC

## 2014-12-20 MED ORDER — METHYLPHENIDATE HCL 20 MG PO TABS: 20.0000 mg | ORAL_TABLET | Freq: Three times a day (TID) | ORAL | Status: DC

## 2014-12-20 NOTE — Telephone Encounter (Signed)
Please refill for a year  

## 2014-12-20 NOTE — Progress Notes (Signed)
[facility]  Provider:  Larey Seat, M D  Referring Provider: Abner Greenspan, MD Primary Care Physician:  Loura Pardon, MD  Chief Complaint  Patient presents with  . Follow-up    positive HLA, has not had her sleep studies yet (she doesn't want to stop the ritalin or tofranil), rm 11, alone    HPI:  Amanda King is a 74 y.o. female seen here as a revisit  from Dr. Glori Bickers for follow up of symptoms of Hypersomnia, RV from 10-31-14 .   I had last seen Amanda King in 2013 after she was referred to me with a diagnosis of narcolepsy with cataplexy.  She had been treated on generic imipramine and Ritalin;   50  mg of imipramine at night , and 20 mg of methylphenidate 4 times daily. In  2013 she described that in spite of the medication she had a cataplectic attack related to the emotionally upsetting news  of her granddaughter's father-in-law's death.  She reported rare sleep hallucinations and had been followed by Dr. Morrell Riddle , Dr. Earley Favor  and was Dr. Tressia Danas patient until his retirement.  The patient states that the narcolepsy was diagnosed  after 1989 with cataplectic attacks.;  she was unsure of the was any viral onset and that she has no family history of narcolepsy but at the time underwent a neck surgery with myelogram and somehow  Remained always sleepier since that time.  She reported ongoing cataplectic spells whenever she tried to change dosing of her medication - she had been using Tofranil as the brand name to sleep  but was due to costs and insurance coverage forced to change to generic imipramine, which triggered additional cataplectic attacks.  We have discussed Xyrem in our last visit, but postponed the initiation as she felt that her current regimen was sufficient. The patient initiates bedtime normally at 11 PM, she will fall asleep promptly, times a week Mondays, Wednesdays and Fridays she will rise at 7 AM to go to the gym. Spontaneously she will wake up  between 7 and 8 on other days as well. During the day she often takes a nap on her couch, about 45 minutes duration that she feels is refreshing. The nocturnal sleep time is estimated to be around 8 hours as well. He can be fragmented but visit dreams but he denied any nightmares or any "acting out "of dreams. She is very emotional at funerals and had multiple cataplectic spells in that setting.  The patient just retired from [employer]. after 24 years and is now rearranging her day-to-day life. 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  She isn't from a smoker but quit over 32 years ago and she has not used alcohol or recreational drugs. She would drink up to 3 cups of caffeinated beverages per day.   Rv 10-31-14; Amanda King is here today for which he revisit and refills. She reports doing fine, he had no paralyzing spells recently no nightmares, no dream intrusion. I asked her about cataplexy and she said it right now she seems to be free of so spells. There is that the only time she experiences cataplexy now is when she is really upsets or last time she was at a funeral and grieving. She endorsed today the  Epworth sleepiness score at 12 points down from 13 and the fatigue severity score at 12 points.  Revisit from 12-20-14 Amanda King has been diagnosed with narcolepsy and cataplexy she has clinically all the necessary symptoms and an HLA test was positive for the HLA he he 1. We were finally unable in May to start her on Xyrem , and she is now on TOFRANIL which she has tolerated well but she takes 1 nocturnal dose.  She takes up to 4 times a day stimulant medication her case Ritalin. Sometimes she can get by this 2 or 3 doses only. It is remarkable that she has found new employment and the energy to work now 6 days a week. Her treatment made the  difference.   Review of Systems: 12-19-14  Out of a complete 14 system review, the patient complains of only the following symptoms, and all other reviewed systems are negative. The patient endorsed the fatigue severity scale at 9 from 12 , and the Epworth Sleepiness Scale at  8 from 12 points , and the geriatric depression score at 0 point. She has significant loss of hearing and wears hearing aids bilaterally.   She does now  exercise. Her son just installed some exercise equipment in her upstairs bedroom.   History   Social History  . Marital Status: Widowed    Spouse Name: N/A  . Number of Children: 3  . Years of Education: GED   Occupational History  .     Social History Main Topics  . Smoking status: Former Smoker    Quit date: 06/09/1980  . Smokeless tobacco: Never Used  . Alcohol Use: No  . Drug Use: No  . Sexual Activity: Not on file   Other Topics Concern  . Not on file   Social History Narrative   Patient is widowed. and her son lives with her.   Walks for exercise.     Patient has three children.   Patient is right-handed.   Patient drinks three cups of caffeine daily.     Patient is retired.   Patient has a GED.    Family History  Problem Relation Age of Onset  . Heart disease Mother     MI  . Peripheral vascular disease Mother   . Hyperlipidemia Mother   . Depression Brother     suicide  . Diabetes Brother   . Diabetes Brother   . Sleep apnea Son   . Sleep apnea Brother     Past Medical History  Diagnosis Date  . Diabetes mellitus     Type II  . Hyperlipidemia   . Hypertension   . Hypothyroidism   . Pneumonia 08/2001  . Narcolepsy with cataplexy   . Migraine     Past Surgical History  Procedure Laterality Date  . Appendectomy  age 66  . Partial hysterectomy  age 51  . Posterior cervical fusion/foraminotomy  02/05/1975    C5-C6-C7  . Hemilaminectomy      C5-6; C6-7    Current Outpatient Prescriptions  Medication Sig Dispense  Refill  . aspirin 81 MG tablet Take 81 mg by mouth daily.      . Blood Glucose Monitoring Suppl (BLOOD GLUCOSE MONITOR KIT) KIT Check blood sugar once daily as needed for DM     . ezetimibe (ZETIA) 10 MG tablet Take 1 tablet (10 mg total) by mouth daily. 30 tablet 11  . glipiZIDE (GLUCOTROL XL) 5 MG 24 hr tablet TAKE 1 TABLET BY  MOUTH EVERY DAY WITH BREAKFAST 30 tablet 5  . glucose blood test strip Check blood sugar once daily as needed for DM.     . hydrochlorothiazide (HYDRODIURIL) 25 MG tablet Take 1 tablet (25 mg total) by mouth daily. 30 tablet 5  . ibuprofen (ADVIL,MOTRIN) 200 MG tablet Take 200 mg by mouth every 6 (six) hours as needed.      Marland Kitchen imipramine (TOFRANIL) 50 MG tablet Take 1 tablet (50 mg total) by mouth at bedtime. 90 tablet 3  . levothyroxine (SYNTHROID, LEVOTHROID) 75 MCG tablet TAKE 1 TABLET (75 MCG TOTAL) BY MOUTH DAILY. 90 tablet 0  . lisinopril (PRINIVIL,ZESTRIL) 20 MG tablet TAKE 1 TABLET BY MOUTH EVERY DAY 30 tablet 2  . metFORMIN (GLUCOPHAGE) 1000 MG tablet Take 1 tablet (1,000 mg total) by mouth 2 (two) times daily with a meal. (Patient taking differently: Take 1,000 mg by mouth 2 (two) times daily with a meal. Take 1/2 pill in am by mouth and 1 pill in pm) 180 tablet 3  . methylphenidate (RITALIN) 20 MG tablet Take 1 tablet (20 mg total) by mouth 3 (three) times daily with meals. 180 tablet 0  . simvastatin (ZOCOR) 80 MG tablet TAKE 1 TABLET (80 MG TOTAL) BY MOUTH AT BEDTIME. 90 tablet 1  . zoster vaccine live, PF, (ZOSTAVAX) 68032 UNT/0.65ML injection Inject 19,400 Units into the skin once. 1 vial 0   No current facility-administered medications for this visit.    Allergies as of 12/20/2014 - Review Complete 12/20/2014  Allergen Reaction Noted  . Penicillins  04/02/2007    Vitals: BP 146/78 mmHg  Pulse 92  Resp 20  Ht 5' 2.21" (1.58 m)  Wt 163 lb (73.936 kg)  BMI 29.62 kg/m2 Last Weight:  Wt Readings from Last 1 Encounters:  12/20/14 163 lb (73.936 kg)    Last Height:   Ht Readings from Last 1 Encounters:  12/20/14 5' 2.21" (1.58 m)   Physical exam: stable weight for years,   General: The patient is awake, alert and appears not in acute distress. The patient is well groomed. Head: Normocephalic, atraumatic. Neck is supple. Mallampati 3 , neck circumference:15.25 . Cardiovascular:  Regular rate and rhythm , without  murmurs or carotid bruit, and without distended neck veins. Respiratory: Lungs are clear to auscultation. Skin:  Without evidence of edema, or rash Trunk: BMI is  elevated and patient  has normal posture.  Neurologic exam : The patient is awake and alert, oriented to place and time.  Memory subjective  described as intact.  There is a normal attention span & concentration ability.  Speech is fluent without dysarthria, but mld dysphonia noted, not aphasia. Mood and affect are appropriate.  Cranial nerves: Pupils are equal and briskly reactive to light. Funduscopic exam without  evidence of pallor or edema.  Extraocular movements  in vertical and horizontal planes intact and without nystagmus. Visual fields by finger perimetry are intact. Hearing to finger rub intact.  Facial sensation intact to fine touch.  Facial motor strength is symmetric and tongue and uvula move midline.  Motor exam:   Normal tone and normal muscle bulk and symmetric normal strength in all extremities. Sensory:  Fine touch, pinprick and vibration were tested in all extremities. Proprioception is tested in the upper extremities only. This was  normal. Coordination: Rapid alternating movements in the fingers/hands is tested and normal. Finger-to-nose maneuver tested and normal without evidence of ataxia, dysmetria or tremor. Gait and station: Patient walks without assistive device  Deep tendon reflexes: in the  upper and lower extremities are symmetric and intact.  Babinski maneuver response is downgoing.   Assessment:  After physical and neurologic  examination, review of laboratory studies, imaging, neurophysiology testing and this patient carries the diagnosis of cataplexy and narcolepsy in 1984, after suffering severe cataplectic attacks.  Sleep hallucinations are rare now.  There is no longer access to the original sleep studies. Plan:  Treatment plan and additional workup :  Intended to have Mrs. Gilbo back for and a sleep study followed by an M SLT but she was unable to stay off her medicine especially now that she needs to maintain employment. His and I will indefinitely postpone a repeat sleep study she is doing well right now on Tofranil and Ritalin and if we need to place her on Xyrem in the future we would have to do these studies first.  HLA testing for narcolepsy with cataplexy was positive for HLA - she has clincally all symptoms and she is doing better on  Tofranil and Ritalin, but was denied XYREM, medicare coverage has finally failed for this patient. .  She had once not taking the medication for 4 days and suffered from severe sleep a hallucinations, vivid and scary dreams came back. 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   I will Refill Tofranil,  Ritalin, prn , but usually twice daily.  She is not on pain medication, she denies any asthma or COPD history .     Vincenzina Jagoda, MD

## 2014-12-20 NOTE — Telephone Encounter (Signed)
Med was already refilled for a year

## 2014-12-27 ENCOUNTER — Other Ambulatory Visit: Payer: Self-pay | Admitting: Family Medicine

## 2015-01-14 ENCOUNTER — Other Ambulatory Visit: Payer: Self-pay | Admitting: Family Medicine

## 2015-01-15 ENCOUNTER — Telehealth: Payer: Self-pay | Admitting: Family Medicine

## 2015-01-15 DIAGNOSIS — E039 Hypothyroidism, unspecified: Secondary | ICD-10-CM

## 2015-01-15 DIAGNOSIS — E785 Hyperlipidemia, unspecified: Secondary | ICD-10-CM

## 2015-01-15 DIAGNOSIS — E1165 Type 2 diabetes mellitus with hyperglycemia: Secondary | ICD-10-CM

## 2015-01-15 DIAGNOSIS — IMO0002 Reserved for concepts with insufficient information to code with codable children: Secondary | ICD-10-CM

## 2015-01-15 NOTE — Telephone Encounter (Signed)
-----   Message from Alvina Chou sent at 01/10/2015  3:07 PM EDT ----- Regarding: Lab orders for Tuesday, 8.9.16 Lab orders for a f/u appt

## 2015-01-16 ENCOUNTER — Other Ambulatory Visit (INDEPENDENT_AMBULATORY_CARE_PROVIDER_SITE_OTHER): Payer: Medicare Other

## 2015-01-16 DIAGNOSIS — E039 Hypothyroidism, unspecified: Secondary | ICD-10-CM

## 2015-01-16 DIAGNOSIS — E785 Hyperlipidemia, unspecified: Secondary | ICD-10-CM | POA: Diagnosis not present

## 2015-01-16 DIAGNOSIS — E1165 Type 2 diabetes mellitus with hyperglycemia: Secondary | ICD-10-CM

## 2015-01-16 DIAGNOSIS — IMO0002 Reserved for concepts with insufficient information to code with codable children: Secondary | ICD-10-CM

## 2015-01-16 LAB — LIPID PANEL
Cholesterol: 153 mg/dL (ref 0–200)
HDL: 38.7 mg/dL — AB (ref >39.00)
NonHDL: 114.53
TRIGLYCERIDES: 299 mg/dL — AB (ref 0.0–149.0)
Total CHOL/HDL Ratio: 4
VLDL: 59.8 mg/dL — AB (ref 0.0–40.0)

## 2015-01-16 LAB — LDL CHOLESTEROL, DIRECT: Direct LDL: 71 mg/dL

## 2015-01-16 LAB — TSH: TSH: 5.01 u[IU]/mL — AB (ref 0.35–4.50)

## 2015-01-16 LAB — HEMOGLOBIN A1C: Hgb A1c MFr Bld: 8.3 % — ABNORMAL HIGH (ref 4.6–6.5)

## 2015-01-19 ENCOUNTER — Ambulatory Visit: Payer: Medicare Other | Admitting: Family Medicine

## 2015-01-26 ENCOUNTER — Encounter: Payer: Self-pay | Admitting: Family Medicine

## 2015-01-26 ENCOUNTER — Ambulatory Visit (INDEPENDENT_AMBULATORY_CARE_PROVIDER_SITE_OTHER): Payer: Medicare Other | Admitting: Family Medicine

## 2015-01-26 VITALS — BP 128/70 | HR 101 | Temp 98.5°F | Wt 158.2 lb

## 2015-01-26 DIAGNOSIS — E669 Obesity, unspecified: Secondary | ICD-10-CM

## 2015-01-26 DIAGNOSIS — E1165 Type 2 diabetes mellitus with hyperglycemia: Secondary | ICD-10-CM

## 2015-01-26 DIAGNOSIS — IMO0002 Reserved for concepts with insufficient information to code with codable children: Secondary | ICD-10-CM

## 2015-01-26 DIAGNOSIS — I10 Essential (primary) hypertension: Secondary | ICD-10-CM | POA: Diagnosis not present

## 2015-01-26 DIAGNOSIS — E785 Hyperlipidemia, unspecified: Secondary | ICD-10-CM | POA: Diagnosis not present

## 2015-01-26 DIAGNOSIS — E039 Hypothyroidism, unspecified: Secondary | ICD-10-CM

## 2015-01-26 MED ORDER — METFORMIN HCL 1000 MG PO TABS: 1000.0000 mg | ORAL_TABLET | Freq: Every day | ORAL | Status: DC

## 2015-01-26 MED ORDER — GLIPIZIDE ER 10 MG PO TB24: 10.0000 mg | ORAL_TABLET | Freq: Every day | ORAL | Status: DC

## 2015-01-26 NOTE — Progress Notes (Signed)
Subjective:    Patient ID: Amanda King, female    DOB: [birth year], 74 y.o.   MRN: [id number]  HPI Here for f/u of chronic health problems   Feels good  Has been working for about 2 mo  Tiring but enjoys it -working at [employer] (3 days per week)   Wt is down 5 lb with bmi of 28 Has not been eating as much - because she is busy and not snacking all the time    bp is stable today  No cp or palpitations or headaches or edema  No side effects to medicines  BP Readings from Last 3 Encounters:  01/26/15 138/66  12/20/14 146/78  11/07/14 130/80      Diabetes Home sugar results - in general - readings are pretty good  DM diet - generally good - then ate a couple of donuts  Exercise -more with work  Symptoms- none  A1C last  Lab Results  Component Value Date   HGBA1C 8.3* 01/16/2015   This is up from 7.3  No problems with medications - she takes 1000 mg at night- cannot tolerate any in the am (due to diarrhea)  Renal protection ace  Last eye exam 1/16     Cholesterol Statin and diet  Lab Results  Component Value Date   CHOL 153 01/16/2015   CHOL 151 06/21/2014   CHOL 127 03/21/2014   Lab Results  Component Value Date   HDL 38.70* 01/16/2015   HDL 40.10 06/21/2014   HDL 33.20* 03/21/2014   Lab Results  Component Value Date   LDLCALC 73 06/21/2014   LDLCALC 49 09/14/2013   LDLCALC 51 12/31/2011   Lab Results  Component Value Date   TRIG 299.0* 01/16/2015   TRIG 190.0* 06/21/2014   TRIG 236.0* 03/21/2014   Lab Results  Component Value Date   CHOLHDL 4 01/16/2015   CHOLHDL 4 06/21/2014   CHOLHDL 4 03/21/2014   Lab Results  Component Value Date   LDLDIRECT 71.0 01/16/2015   LDLDIRECT 63.4 03/21/2014   LDLDIRECT 79.7 06/06/2010   triglycerides are up - likely due to sugar being higher   Patient Active Problem List   Diagnosis Date Noted  . Narcolepsy with cataplexy 10/31/2014  . Acute sinusitis 08/15/2014  . Acute bacterial sinusitis  03/28/2014  . Stress reaction 03/28/2014  . Encounter for Medicare annual wellness exam 09/22/2013  . Hypersomnia, persistent 07/15/2013  . Foot abrasion 04/09/2012  . Other screening mammogram 12/31/2011  . Colon cancer screening 12/31/2011  . Complicated UTI (urinary tract infection) 11/14/2011  . Obesity 07/01/2011  . ANXIETY 05/06/2010  . DERMATOPHYTOSIS OF NAIL 12/21/2008  . Hypothyroidism 09/02/2006  . Diabetes type 2, uncontrolled 09/02/2006  . Hyperlipidemia LDL goal <100 09/02/2006  . NARCOLEPSY W/CATAPLEXY 09/02/2006  . Essential hypertension 09/02/2006  . PNEUMONIA, HX OF 09/02/2006   Past Medical History  Diagnosis Date  . Diabetes mellitus     Type II  . Hyperlipidemia   . Hypertension   . Hypothyroidism   . Pneumonia 08/2001  . Narcolepsy with cataplexy   . Migraine    Past Surgical History  Procedure Laterality Date  . Appendectomy  age 43  . Partial hysterectomy  age 2  . Posterior cervical fusion/foraminotomy  02/05/1975    C5-C6-C7  . Hemilaminectomy      C5-6; C6-7   Social History  Substance Use Topics  . Smoking status: Former Smoker    Quit date: 06/09/1980  . Smokeless  tobacco: Never Used  . Alcohol Use: No   Family History  Problem Relation Age of Onset  . Heart disease Mother     MI  . Peripheral vascular disease Mother   . Hyperlipidemia Mother   . Depression Brother     suicide  . Diabetes Brother   . Diabetes Brother   . Sleep apnea Son   . Sleep apnea Brother    Allergies  Allergen Reactions  . Penicillins     REACTION: GI upset   Current Outpatient Prescriptions on File Prior to Visit  Medication Sig Dispense Refill  . aspirin 81 MG tablet Take 81 mg by mouth daily.      . Blood Glucose Monitoring Suppl (BLOOD GLUCOSE MONITOR KIT) KIT Check blood sugar once daily as needed for DM     . ezetimibe (ZETIA) 10 MG tablet Take 1 tablet (10 mg total) by mouth daily. 30 tablet 11  . glipiZIDE (GLUCOTROL XL) 5 MG 24 hr tablet TAKE  1 TABLET BY MOUTH EVERY DAY WITH BREAKFAST 30 tablet 5  . glucose blood test strip Check blood sugar once daily as needed for DM.     . hydrochlorothiazide (HYDRODIURIL) 25 MG tablet Take 1 tablet (25 mg total) by mouth daily. 30 tablet 5  . ibuprofen (ADVIL,MOTRIN) 200 MG tablet Take 200 mg by mouth every 6 (six) hours as needed.      Marland Kitchen imipramine (TOFRANIL) 50 MG tablet Take 1 tablet (50 mg total) by mouth at bedtime. 90 tablet 3  . levothyroxine (SYNTHROID, LEVOTHROID) 75 MCG tablet TAKE 1 TABLET (75 MCG TOTAL) BY MOUTH DAILY. 90 tablet 0  . lisinopril (PRINIVIL,ZESTRIL) 20 MG tablet TAKE 1 TABLET BY MOUTH EVERY DAY 30 tablet 2  . metFORMIN (GLUCOPHAGE) 1000 MG tablet TAKE 1 TABLET (1,000 MG TOTAL) BY MOUTH 2 (TWO) TIMES DAILY WITH A MEAL. 180 tablet 0  . methylphenidate (RITALIN) 20 MG tablet Take 1 tablet (20 mg total) by mouth 3 (three) times daily with meals. 180 tablet 0  . simvastatin (ZOCOR) 80 MG tablet TAKE 1 TABLET (80 MG TOTAL) BY MOUTH AT BEDTIME. 90 tablet 0  . zoster vaccine live, PF, (ZOSTAVAX) 24268 UNT/0.65ML injection Inject 19,400 Units into the skin once. 1 vial 0   No current facility-administered medications on file prior to visit.     Review of Systems Review of Systems  Constitutional: Negative for fever, appetite change, fatigue and unexpected weight change.  Eyes: Negative for pain and visual disturbance.  Respiratory: Negative for cough and shortness of breath.   Cardiovascular: Negative for cp or palpitations    Gastrointestinal: Negative for nausea, diarrhea and constipation.  Genitourinary: Negative for urgency and frequency.  Skin: Negative for pallor or rash   Neurological: Negative for weakness, light-headedness, numbness and headaches.  Hematological: Negative for adenopathy. Does not bruise/bleed easily.  Psychiatric/Behavioral: Negative for dysphoric mood. The patient is not nervous/anxious.         Objective:   Physical Exam  Constitutional:  She appears well-developed and well-nourished. No distress.  overwt and well app  HENT:  Head: Normocephalic and atraumatic.  Mouth/Throat: Oropharynx is clear and moist.  Eyes: Conjunctivae and EOM are normal. Pupils are equal, round, and reactive to light.  Neck: Normal range of motion. Neck supple. No JVD present. Carotid bruit is not present. No thyromegaly present.  Cardiovascular: Normal rate, regular rhythm, normal heart sounds and intact distal pulses.  Exam reveals no gallop.   Pulmonary/Chest: Effort normal and  breath sounds normal. No respiratory distress. She has no wheezes. She has no rales.  No crackles  Abdominal: Soft. Bowel sounds are normal. She exhibits no distension, no abdominal bruit and no mass. There is no tenderness.  Musculoskeletal: She exhibits no edema.  Lymphadenopathy:    She has no cervical adenopathy.  Neurological: She is alert. She has normal reflexes.  Skin: Skin is warm and dry. No rash noted.  Psychiatric: She has a normal mood and affect.          Assessment & Plan:   Problem List Items Addressed This Visit    Diabetes type 2, uncontrolled    Lab Results  Component Value Date   HGBA1C 8.3* 01/16/2015   Up since she cannot tolerate any am metformin (due to diarrhea) Will inc her glipizide ER to 10 mg daily watching closely for hypoglycemia (disc routine glucose checks) Rev diet/exercise  Doing better since getting a job F/u 3 mo with lab prior       Relevant Medications   glipiZIDE (GLUCOTROL XL) 10 MG 24 hr tablet   metFORMIN (GLUCOPHAGE) 1000 MG tablet   Essential hypertension - Primary    bp in fair control at this time  BP Readings from Last 1 Encounters:  01/26/15 128/70   No changes needed Disc lifstyle change with low sodium diet and exercise  Labs reviewed       Hyperlipidemia LDL goal <100    Disc goals for lipids and reasons to control them Rev labs with pt Rev low sat fat diet in detail  Triglycerides are up  likely due to inc in blood sugar levels  Continue statin and low fat diet       Obesity    Commended on recent wt loss - working on diet and exercise Less snacking with a job now       Relevant Medications   glipiZIDE (GLUCOTROL XL) 10 MG 24 hr tablet   metFORMIN (GLUCOPHAGE) 1000 MG tablet

## 2015-01-26 NOTE — Assessment & Plan Note (Addendum)
Disc goals for lipids and reasons to control them Rev labs with pt Rev low sat fat diet in detail  Triglycerides are up likely due to inc in blood sugar levels  Continue statin and low fat diet

## 2015-01-26 NOTE — Patient Instructions (Addendum)
Blood pressure is well controlled  Your diabetes is not as well controlled since you can't take metformin in the am  Let's increase glipizide ER to 10 mg  If any low glucose readings (below 70) - stop it and let me know   Eat regular meals   Follow up in 3 months with labs prior

## 2015-01-26 NOTE — Assessment & Plan Note (Signed)
Commended on recent wt loss - working on diet and exercise Less snacking with a job now

## 2015-01-26 NOTE — Assessment & Plan Note (Signed)
Lab Results  Component Value Date   HGBA1C 8.3* 01/16/2015   Up since she cannot tolerate any am metformin (due to diarrhea) Will inc her glipizide ER to 10 mg daily watching closely for hypoglycemia (disc routine glucose checks) Rev diet/exercise  Doing better since getting a job F/u 3 mo with lab prior

## 2015-01-26 NOTE — Progress Notes (Signed)
Pre visit review using our clinic review tool, if applicable. No additional management support is needed unless otherwise documented below in the visit note. 

## 2015-01-26 NOTE — Assessment & Plan Note (Signed)
bp in fair control at this time  BP Readings from Last 1 Encounters:  01/26/15 128/70   No changes needed Disc lifstyle change with low sodium diet and exercise  Labs reviewed

## 2015-02-17 ENCOUNTER — Other Ambulatory Visit: Payer: Self-pay | Admitting: Family Medicine

## 2015-04-08 ENCOUNTER — Other Ambulatory Visit: Payer: Self-pay | Admitting: Family Medicine

## 2015-04-23 ENCOUNTER — Other Ambulatory Visit (INDEPENDENT_AMBULATORY_CARE_PROVIDER_SITE_OTHER): Payer: Medicare Other

## 2015-04-23 DIAGNOSIS — E1165 Type 2 diabetes mellitus with hyperglycemia: Secondary | ICD-10-CM

## 2015-04-23 DIAGNOSIS — E039 Hypothyroidism, unspecified: Secondary | ICD-10-CM

## 2015-04-23 DIAGNOSIS — E785 Hyperlipidemia, unspecified: Secondary | ICD-10-CM

## 2015-04-23 DIAGNOSIS — IMO0002 Reserved for concepts with insufficient information to code with codable children: Secondary | ICD-10-CM

## 2015-04-23 LAB — LDL CHOLESTEROL, DIRECT: LDL DIRECT: 76 mg/dL

## 2015-04-23 LAB — LIPID PANEL
CHOL/HDL RATIO: 4
Cholesterol: 156 mg/dL (ref 0–200)
HDL: 39.3 mg/dL (ref >39.00)
NONHDL: 117.14
TRIGLYCERIDES: 270 mg/dL — AB (ref 0.0–149.0)
VLDL: 54 mg/dL — ABNORMAL HIGH (ref 0.0–40.0)

## 2015-04-23 LAB — HEMOGLOBIN A1C: HEMOGLOBIN A1C: 8 % — AB (ref 4.6–6.5)

## 2015-04-23 LAB — TSH: TSH: 5.81 u[IU]/mL — ABNORMAL HIGH (ref 0.35–4.50)

## 2015-04-26 ENCOUNTER — Other Ambulatory Visit: Payer: Self-pay | Admitting: *Deleted

## 2015-04-27 ENCOUNTER — Other Ambulatory Visit: Payer: Self-pay | Admitting: Family Medicine

## 2015-04-30 ENCOUNTER — Encounter: Payer: Self-pay | Admitting: Family Medicine

## 2015-04-30 ENCOUNTER — Ambulatory Visit (INDEPENDENT_AMBULATORY_CARE_PROVIDER_SITE_OTHER): Payer: Medicare Other | Admitting: Family Medicine

## 2015-04-30 VITALS — BP 130/70 | HR 91 | Temp 98.6°F | Ht 62.25 in | Wt 158.5 lb

## 2015-04-30 DIAGNOSIS — E1165 Type 2 diabetes mellitus with hyperglycemia: Secondary | ICD-10-CM | POA: Diagnosis not present

## 2015-04-30 DIAGNOSIS — IMO0001 Reserved for inherently not codable concepts without codable children: Secondary | ICD-10-CM

## 2015-04-30 DIAGNOSIS — E663 Overweight: Secondary | ICD-10-CM

## 2015-04-30 DIAGNOSIS — E785 Hyperlipidemia, unspecified: Secondary | ICD-10-CM

## 2015-04-30 DIAGNOSIS — Z23 Encounter for immunization: Secondary | ICD-10-CM | POA: Diagnosis not present

## 2015-04-30 DIAGNOSIS — I10 Essential (primary) hypertension: Secondary | ICD-10-CM | POA: Diagnosis not present

## 2015-04-30 MED ORDER — METFORMIN HCL ER 750 MG PO TB24: ORAL_TABLET | ORAL | Status: DC

## 2015-04-30 NOTE — Assessment & Plan Note (Signed)
bp in fair control at this time  BP Readings from Last 1 Encounters:  04/30/15 130/70   No changes needed Disc lifstyle change with low sodium diet and exercise  Labs reviewed

## 2015-04-30 NOTE — Patient Instructions (Signed)
Do check some glucose readings at different times of the day  Stay active  Stick with diabetic diet  Change the metformin to the 750 mg XR (extended release)- to see if tolerated and it it will work better to help blood sugar for 24 hours If not tolerating it - just let me know   Follow up in 3 months with labs prior

## 2015-04-30 NOTE — Progress Notes (Signed)
Pre visit review using our clinic review tool, if applicable. No additional management support is needed unless otherwise documented below in the visit note. 

## 2015-04-30 NOTE — Progress Notes (Signed)
Subjective:    Patient ID: Amanda SparrowBrenda C Vasil, female    DOB: Oct 17, 1940, 74 y.o.   MRN: [id number]  HPI Here for f/u of chronic health problems   Wt is stable  BMI is 28  bp is stable today  No cp or palpitations or headaches or edema  No side effects to medicines  BP Readings from Last 3 Encounters:  04/30/15 140/68  01/26/15 128/70  12/20/14 146/78     Diabetes Home sugar results - running around 146-148 (none low) DM diet - pt states she was at the beach last week and did not eat well  She has a late work schedule-hard to eat well (trying to not eat before she goes to bed) Has done DM teaching in the past  Exercise - working / nothing extra (she has an active job) Symptoms no excessive thirst or urination A1C last  Lab Results  Component Value Date   HGBA1C 8.0* 04/23/2015  this is down from 8.3 Intol of metformin - takes once daily at night - (not during the day due to diarrhea) Is open to trying the extended release  Taking glipizide in the am   No problems with medications  Renal protection- ace  Last eye exam - 1/16   Hyperlipidemia Lab Results  Component Value Date   CHOL 156 04/23/2015   CHOL 153 01/16/2015   CHOL 151 06/21/2014   Lab Results  Component Value Date   HDL 39.30 04/23/2015   HDL 38.70* 01/16/2015   HDL 40.10 06/21/2014   Lab Results  Component Value Date   LDLCALC 73 06/21/2014   LDLCALC 49 09/14/2013   LDLCALC 51 12/31/2011   Lab Results  Component Value Date   TRIG 270.0* 04/23/2015   TRIG 299.0* 01/16/2015   TRIG 190.0* 06/21/2014   Lab Results  Component Value Date   CHOLHDL 4 04/23/2015   CHOLHDL 4 01/16/2015   CHOLHDL 4 06/21/2014   Lab Results  Component Value Date   LDLDIRECT 76.0 04/23/2015   LDLDIRECT 71.0 01/16/2015   LDLDIRECT 63.4 03/21/2014   stable with slt improvement in triglycerides    Review of Systems Review of Systems  Constitutional: Negative for fever, appetite change, fatigue and unexpected  weight change.  Eyes: Negative for pain and visual disturbance.  Respiratory: Negative for cough and shortness of breath.   Cardiovascular: Negative for cp or palpitations    Gastrointestinal: Negative for nausea, diarrhea and constipation.  Genitourinary: Negative for urgency and frequency.  Skin: Negative for pallor or rash   Neurological: Negative for weakness, light-headedness, numbness and headaches.  Hematological: Negative for adenopathy. Does not bruise/bleed easily.  Psychiatric/Behavioral: Negative for dysphoric mood. The patient is not nervous/anxious.         Objective:   Physical Exam  Constitutional: She appears well-developed and well-nourished. No distress.  overwt and well appearing   HENT:  Head: Normocephalic and atraumatic.  Mouth/Throat: Oropharynx is clear and moist.  Eyes: Conjunctivae and EOM are normal. Pupils are equal, round, and reactive to light.  Neck: Normal range of motion. Neck supple. No JVD present. Carotid bruit is not present. No thyromegaly present.  Cardiovascular: Normal rate, regular rhythm, normal heart sounds and intact distal pulses.  Exam reveals no gallop.   Pulmonary/Chest: Effort normal and breath sounds normal. No respiratory distress. She has no wheezes. She has no rales.  No crackles  Abdominal: Soft. Bowel sounds are normal. She exhibits no distension, no abdominal bruit and no mass. There  is no tenderness.  Musculoskeletal: She exhibits no edema.  Lymphadenopathy:    She has no cervical adenopathy.  Neurological: She is alert. She has normal reflexes.  Skin: Skin is warm and dry. No rash noted.  Psychiatric: She has a normal mood and affect.          Assessment & Plan:   Problem List Items Addressed This Visit      Cardiovascular and Mediastinum   Essential hypertension    bp in fair control at this time  BP Readings from Last 1 Encounters:  04/30/15 130/70   No changes needed Disc lifstyle change with low sodium  diet and exercise  Labs reviewed         Endocrine   Diabetes type 2, uncontrolled (HCC)    Lab Results  Component Value Date   HGBA1C 8.0* 04/23/2015   This is slt improved Pt is willing to try the longer acting metformin (XR) to see if this minimizes GI side eff  Hope this will keep glucose down better through the day  If not - will disc basal insulin or other newer tx  Enc her to work on low glycemic diet and wt loss as well       Relevant Medications   metFORMIN (GLUCOPHAGE-XR) 750 MG 24 hr tablet     Other   Hyperlipidemia LDL goal <100    Disc goals for lipids and reasons to control them Rev labs with pt Rev low sat fat diet in detail Fairly stable with statin and zetia       Overweight    Enc to keep working on wt loss for DM control  Discussed how this problem influences overall health and the risks it imposes  Reviewed plan for weight loss with lower calorie diet (via better food choices and also portion control or program like weight watchers) and exercise building up to or more than 30 minutes 5 days per week including some aerobic activity          Other Visit Diagnoses    Need for influenza vaccination    -  Primary    Relevant Orders    Flu Vaccine QUAD 36+ mos PF IM (Fluarix & Fluzone Quad PF) (Completed)

## 2015-04-30 NOTE — Assessment & Plan Note (Signed)
Disc goals for lipids and reasons to control them Rev labs with pt Rev low sat fat diet in detail Fairly stable with statin and zetia

## 2015-04-30 NOTE — Assessment & Plan Note (Signed)
Lab Results  Component Value Date   HGBA1C 8.0* 04/23/2015   This is slt improved Pt is willing to try the longer acting metformin (XR) to see if this minimizes GI side eff  Hope this will keep glucose down better through the day  If not - will disc basal insulin or other newer tx  Enc her to work on low glycemic diet and wt loss as well

## 2015-04-30 NOTE — Assessment & Plan Note (Signed)
Enc to keep working on wt loss for DM control  Discussed how this problem influences overall health and the risks it imposes  Reviewed plan for weight loss with lower calorie diet (via better food choices and also portion control or program like weight watchers) and exercise building up to or more than 30 minutes 5 days per week including some aerobic activity

## 2015-05-01 ENCOUNTER — Other Ambulatory Visit: Payer: Self-pay | Admitting: *Deleted

## 2015-05-01 MED ORDER — LISINOPRIL 20 MG PO TABS: 20.0000 mg | ORAL_TABLET | Freq: Every day | ORAL | Status: DC

## 2015-05-09 ENCOUNTER — Other Ambulatory Visit: Payer: Self-pay | Admitting: Family Medicine

## 2015-05-18 ENCOUNTER — Other Ambulatory Visit: Payer: Self-pay | Admitting: Family Medicine

## 2015-05-20 ENCOUNTER — Other Ambulatory Visit: Payer: Self-pay | Admitting: Family Medicine

## 2015-06-25 ENCOUNTER — Telehealth: Payer: Self-pay | Admitting: Neurology

## 2015-06-25 ENCOUNTER — Other Ambulatory Visit: Payer: Self-pay | Admitting: Adult Health

## 2015-06-25 DIAGNOSIS — G4710 Hypersomnia, unspecified: Secondary | ICD-10-CM

## 2015-06-25 DIAGNOSIS — G47411 Narcolepsy with cataplexy: Secondary | ICD-10-CM

## 2015-06-25 NOTE — Telephone Encounter (Signed)
error 

## 2015-06-25 NOTE — Telephone Encounter (Signed)
Pt called requesting refill for methylphenidate (RITALIN) 20 MG tablet . Pt said she has an appt with [place] tomorrow and would like to get it then.

## 2015-06-26 ENCOUNTER — Ambulatory Visit (INDEPENDENT_AMBULATORY_CARE_PROVIDER_SITE_OTHER): Payer: Medicare Other | Admitting: Adult Health

## 2015-06-26 ENCOUNTER — Encounter: Payer: Self-pay | Admitting: Adult Health

## 2015-06-26 VITALS — BP 145/67 | HR 90 | Ht 62.25 in | Wt 160.0 lb

## 2015-06-26 DIAGNOSIS — G47419 Narcolepsy without cataplexy: Secondary | ICD-10-CM | POA: Diagnosis not present

## 2015-06-26 MED ORDER — METHYLPHENIDATE HCL 20 MG PO TABS: 20.0000 mg | ORAL_TABLET | Freq: Three times a day (TID) | ORAL | Status: DC

## 2015-06-26 NOTE — Progress Notes (Signed)
PATIENT: Amanda King DOB: [birth year]  REASON FOR VISIT: follow up- narcolepsy HISTORY FROM: patient  HISTORY OF PRESENT ILLNESS: Amanda King is a 75 year old female with a history of narcolepsy. She returns today for follow-up visit. The patient continues to take Ritalin and imipramine. She states this combination works well for her. She states that she was even able to go back to work with this combination of medication. She states that she typically takes Ritalin 3 times a day if she has to work. She takes her first dose in the morning second dose at 3 PM and her third dose at 7 PM. When she does not have to work she typically only takes 2 tablets. She continues to take imipramine at night. She states this works well for her sleep. She tends to go bed around 11:30 and arises at 7 AM. She is currently working second shift at [employer]. The patient denies any trouble with her memory. She states that she is able to complete her finances without difficulty. She is able to complete all ADLs independently. She lives with her son. She denies any new neurological symptoms. She returns today for an evaluation.  HISTORY 12/20/14: Amanda King is a 75 y.o. female seen here as a revisit from Amanda King for follow up of symptoms of Hypersomnia, RV from 10-31-14 . I had last seen Amanda King in 2013 after she was referred to me with a diagnosis of narcolepsy with cataplexy. She had been treated on generic imipramine and Ritalin;  50 mg of imipramine at night , and 20 mg of methylphenidate 4 times daily. In 2013 she described that in spite of the medication she had a cataplectic attack related to the emotionally upsetting news of her granddaughter's father-in-law's death.  She reported rare sleep hallucinations and had been followed by Amanda King , Amanda King and was Amanda King patient until his retirement.  The patient states that the narcolepsy was diagnosed after 1989 with cataplectic  attacks.; she was unsure of the was any viral onset and that she has no family history of narcolepsy but at the time underwent a neck surgery with myelogram and somehow Remained always sleepier since that time.  She reported ongoing cataplectic spells whenever she tried to change dosing of her medication - she had been using Tofranil as the brand name to sleep but was due to costs and insurance coverage forced to change to generic imipramine, which triggered additional cataplectic attacks. We have discussed Xyrem in our last visit, but postponed the initiation as she felt that her current regimen was sufficient. The patient initiates bedtime normally at 11 PM, she will fall asleep promptly, times a week Mondays, Wednesdays and Fridays she will rise at 7 AM to go to the gym. Spontaneously she will wake up between 7 and 8 on other days as well. During the day she often takes a nap on her couch, about 45 minutes duration that she feels is refreshing. The nocturnal sleep time is estimated to be around 8 hours as well. He can be fragmented but visit dreams but he denied any nightmares or any "acting out "of dreams. She is very emotional at funerals and had multiple cataplectic spells in that setting.  The patient just retired from [employer]. after 24 years and is now rearranging her day-to-day life. 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 She isn't from a smoker but quit over 32 years ago and she has not used alcohol or recreational drugs. She would drink up to 3 cups of caffeinated beverages per day.   Rv 10-31-14; Amanda King is here today for which he revisit and refills. She reports doing fine, he had no paralyzing spells recently no nightmares, no dream intrusion. I asked her about cataplexy and she said it right now she seems to be free  of so spells. There is that the only time she experiences cataplexy now is when she is really upsets or last time she was at a funeral and grieving. She endorsed today the Epworth sleepiness score at 12 points down from 13 and the fatigue severity score at 12 points.  Revisit from 12-20-14 Amanda King has been diagnosed with narcolepsy and cataplexy she has clinically all the necessary symptoms and an HLA test was positive for the HLA he he 1. We were finally unable in May to start her on Xyrem , and she is now on TOFRANIL which she has tolerated well but she takes 1 nocturnal dose.  She takes up to 4 times a day stimulant medication her case Ritalin. Sometimes she can get by this 2 or 3 doses only. It is remarkable that she has found new employment and the energy to work now 6 days a week. Her treatment made the difference.    REVIEW OF SYSTEMS: Out of a complete 14 system review of symptoms, the patient complains only of the following symptoms, and all other reviewed systems are negative.  See history of present illness, Epworth sleepiness score 12, fatigue severity score  ALLERGIES: Allergies  Allergen Reactions  . Penicillins     REACTION: GI upset    HOME MEDICATIONS: Outpatient Prescriptions Prior to Visit  Medication Sig Dispense Refill  . aspirin 81 MG tablet Take 81 mg by mouth daily.      . Blood Glucose Monitoring Suppl (BLOOD GLUCOSE MONITOR KIT) KIT Check blood sugar once daily as needed for DM     . ezetimibe (ZETIA) 10 MG tablet Take 1 tablet (10 mg total) by mouth daily. 30 tablet 11  . glipiZIDE (GLUCOTROL XL) 10 MG 24 hr tablet Take 1 tablet (10 mg total) by mouth daily with breakfast. 90 tablet 3  . glucose blood test strip Check blood sugar once daily as needed for DM.     . hydrochlorothiazide (HYDRODIURIL) 25 MG tablet TAKE 1 TABLET BY MOUTH EVERY DAY 30 tablet 3  . ibuprofen (ADVIL,MOTRIN) 200 MG tablet Take 200 mg by mouth every 6 (six) hours as needed.      Marland Kitchen  imipramine (TOFRANIL) 50 MG tablet Take 1 tablet (50 mg total) by mouth at bedtime. 90 tablet 3  . levothyroxine (SYNTHROID, LEVOTHROID) 75 MCG tablet TAKE 1 TABLET (75 MCG TOTAL) BY MOUTH DAILY. 90 tablet 0  . lisinopril (PRINIVIL,ZESTRIL) 20 MG tablet Take 1 tablet (20 mg total) by mouth daily. 90 tablet 1  . metFORMIN (GLUCOPHAGE) 1000 MG tablet Take 1 tablet (1,000 mg total) by mouth daily. In pm 90 tablet 3  . metFORMIN (GLUCOPHAGE-XR) 750 MG 24 hr tablet Take one pill by mouth each evening 30 tablet 11  . methylphenidate (RITALIN) 20 MG tablet Take 1 tablet (20 mg total) by mouth 3 (three) times daily with meals. 180 tablet 0  . simvastatin (ZOCOR) 80 MG tablet TAKE 1  TABLET (80 MG TOTAL) BY MOUTH AT BEDTIME. 90 tablet 0  . zoster vaccine live, PF, (ZOSTAVAX) 11914 UNT/0.65ML injection Inject 19,400 Units into the skin once. 1 vial 0   No facility-administered medications prior to visit.    PAST MEDICAL HISTORY: Past Medical History  Diagnosis Date  . Diabetes mellitus     Type II  . Hyperlipidemia   . Hypertension   . Hypothyroidism   . Pneumonia 08/2001  . Narcolepsy with cataplexy   . Migraine     PAST SURGICAL HISTORY: Past Surgical History  Procedure Laterality Date  . Appendectomy  age 54  . Partial hysterectomy  age 21  . Posterior cervical fusion/foraminotomy  02/05/1975    C5-C6-C7  . Hemilaminectomy      C5-6; C6-7    FAMILY HISTORY: Family History  Problem Relation Age of Onset  . Heart disease Mother     MI  . Peripheral vascular disease Mother   . Hyperlipidemia Mother   . Depression Brother     suicide  . Diabetes Brother   . Diabetes Brother   . Sleep apnea Son   . Sleep apnea Brother     SOCIAL HISTORY: Social History   Social History  . Marital Status: Widowed    Spouse Name: N/A  . Number of Children: 3  . Years of Education: GED   Occupational History  .     Social History Main Topics  . Smoking status: Former Smoker    Quit date:  06/09/1980  . Smokeless tobacco: Never Used  . Alcohol Use: No  . Drug Use: No  . Sexual Activity: Not on file   Other Topics Concern  . Not on file   Social History Narrative   Patient is widowed. and her son lives with her.   Walks for exercise.     Patient has three children.   Patient is right-handed.   Patient drinks three cups of caffeine daily.     Patient is retired.   Patient has a GED.      PHYSICAL EXAM  Filed Vitals:   06/26/15 0916  BP: 145/67  Pulse: 90  Height: 5' 2.25" (1.581 m)  Weight: 160 lb (72.576 kg)   Body mass index is 29.04 kg/(m^2).  Generalized: Well developed, in no acute distress   Neurological examination  Mentation: Alert oriented to time, place, history taking. Follows all commands speech and language fluent Cranial nerve II-XII: Pupils were equal round reactive to light. Extraocular movements were full, visual field were full on confrontational test. Facial sensation and strength were normal. Uvula tongue midline. Head turning and shoulder shrug  were normal and symmetric. Motor: The motor testing reveals 5 over 5 strength of all 4 extremities. Good symmetric motor tone is noted throughout.  Sensory: Sensory testing is intact to soft touch on all 4 extremities. No evidence of extinction is noted.  Coordination: Cerebellar testing reveals good finger-nose-finger and heel-to-shin bilaterally.  Gait and station: Gait is normal. Tandem gait is normal. Romberg is negative. No drift is seen.  Reflexes: Deep tendon reflexes are symmetric and normal bilaterally.   DIAGNOSTIC DATA (LABS, IMAGING, TESTING) - I reviewed patient records, labs, notes, testing and imaging myself where available.  Lab Results  Component Value Date   WBC 6.4 09/14/2013   HGB 13.4 09/14/2013   HCT 39.5 09/14/2013   MCV 89.6 09/14/2013   PLT 241.0 09/14/2013      Component Value Date/Time   NA 136 11/07/2014  0958   K 4.2 11/07/2014 0958   CL 99 11/07/2014 0958     CO2 29 11/07/2014 0958   GLUCOSE 185* 11/07/2014 0958   BUN 18 11/07/2014 0958   CREATININE 0.83 11/07/2014 0958   CALCIUM 9.0 11/07/2014 0958   PROT 7.2 09/14/2013 0842   ALBUMIN 4.3 09/14/2013 0842   AST 28 09/14/2013 0842   ALT 38* 09/14/2013 0842   ALKPHOS 89 09/14/2013 0842   BILITOT 0.5 09/14/2013 0842   GFRNONAA 70.16 06/06/2010 0954   GFRAA 92 03/02/2008 1037   Lab Results  Component Value Date   CHOL 156 04/23/2015   HDL 39.30 04/23/2015   LDLCALC 73 06/21/2014   LDLDIRECT 76.0 04/23/2015   TRIG 270.0* 04/23/2015   CHOLHDL 4 04/23/2015   Lab Results  Component Value Date   HGBA1C 8.0* 04/23/2015    Lab Results  Component Value Date   TSH 5.81* 04/23/2015      ASSESSMENT AND PLAN 75 y.o. year old female  has a past medical history of Diabetes mellitus; Hyperlipidemia; Hypertension; Hypothyroidism; Pneumonia (08/2001); Narcolepsy with cataplexy; and Migraine. here with:  1. Narcolepsy  Overall the patient is doing well. She will continue on Ritalin and imipramine. Dr. Brett Fairy provided her with a prescription for her Ritalin. Patient advised that if her symptoms worsen or she develops any new symptoms she should let us know. She will follow-up in 6 months or sooner if needed.     Ward Givens, MSN, NP-C 06/26/2015, 9:42 AM [facility] [address], [place], [place] [address] [phone]

## 2015-06-26 NOTE — Patient Instructions (Signed)
Continue Ritalin and imipramine If your symptoms worsen or you develop new symptoms please let us know.

## 2015-06-26 NOTE — Telephone Encounter (Signed)
Baxter Hire can you get Dr. Vickey Huger to sign this for the patient. I can only write for 90 tablets. Patient wants 180.

## 2015-07-09 ENCOUNTER — Telehealth: Payer: Self-pay

## 2015-07-09 ENCOUNTER — Telehealth: Payer: Self-pay | Admitting: *Deleted

## 2015-07-09 MED ORDER — IMIPRAMINE HCL 50 MG PO TABS: 50.0000 mg | ORAL_TABLET | Freq: Every day | ORAL | Status: DC

## 2015-07-09 MED ORDER — EZETIMIBE 10 MG PO TABS: 10.0000 mg | ORAL_TABLET | Freq: Every day | ORAL | Status: DC

## 2015-07-09 MED ORDER — METFORMIN HCL ER 750 MG PO TB24: ORAL_TABLET | ORAL | Status: DC

## 2015-07-09 NOTE — Telephone Encounter (Signed)
Amanda King [phone]  [facility] [address]  [employer] insurance has changed her pharmacy from [facility] to [facility], she needs refills or prescription faxed to [facility] on ezetimibe (ZETIA) 10 MG tablet and  / metFORMIN (GLUCOPHAGE-XR) 750 MG 24 hr tablet.

## 2015-07-09 NOTE — Telephone Encounter (Signed)
Rx has been sent.  Receipt confirmed by pharmacy.   

## 2015-07-27 ENCOUNTER — Other Ambulatory Visit (INDEPENDENT_AMBULATORY_CARE_PROVIDER_SITE_OTHER): Payer: Medicare Other

## 2015-07-27 DIAGNOSIS — IMO0001 Reserved for inherently not codable concepts without codable children: Secondary | ICD-10-CM

## 2015-07-27 DIAGNOSIS — E1165 Type 2 diabetes mellitus with hyperglycemia: Secondary | ICD-10-CM

## 2015-07-27 LAB — HEMOGLOBIN A1C: HEMOGLOBIN A1C: 9.4 % — AB (ref 4.6–6.5)

## 2015-08-03 ENCOUNTER — Encounter: Payer: Self-pay | Admitting: Family Medicine

## 2015-08-03 ENCOUNTER — Ambulatory Visit (INDEPENDENT_AMBULATORY_CARE_PROVIDER_SITE_OTHER): Payer: Medicare Other | Admitting: Family Medicine

## 2015-08-03 VITALS — BP 126/54 | HR 86 | Temp 97.5°F | Ht 62.25 in | Wt 159.8 lb

## 2015-08-03 DIAGNOSIS — E1165 Type 2 diabetes mellitus with hyperglycemia: Secondary | ICD-10-CM | POA: Diagnosis not present

## 2015-08-03 DIAGNOSIS — IMO0001 Reserved for inherently not codable concepts without codable children: Secondary | ICD-10-CM

## 2015-08-03 DIAGNOSIS — Z23 Encounter for immunization: Secondary | ICD-10-CM | POA: Diagnosis not present

## 2015-08-03 DIAGNOSIS — I10 Essential (primary) hypertension: Secondary | ICD-10-CM

## 2015-08-03 DIAGNOSIS — E785 Hyperlipidemia, unspecified: Secondary | ICD-10-CM | POA: Diagnosis not present

## 2015-08-03 DIAGNOSIS — E663 Overweight: Secondary | ICD-10-CM

## 2015-08-03 MED ORDER — METFORMIN HCL ER 750 MG PO TB24: 750.0000 mg | ORAL_TABLET | Freq: Two times a day (BID) | ORAL | Status: DC

## 2015-08-03 MED ORDER — ZOSTER VACCINE LIVE 19400 UNT/0.65ML ~~LOC~~ SOLR: 0.6500 mL | Freq: Once | SUBCUTANEOUS | Status: DC

## 2015-08-03 MED ORDER — LISINOPRIL 20 MG PO TABS: 20.0000 mg | ORAL_TABLET | Freq: Every day | ORAL | Status: DC

## 2015-08-03 MED ORDER — LEVOTHYROXINE SODIUM 75 MCG PO TABS: ORAL_TABLET | ORAL | Status: DC

## 2015-08-03 MED ORDER — SIMVASTATIN 80 MG PO TABS: ORAL_TABLET | ORAL | Status: DC

## 2015-08-03 MED ORDER — GLIPIZIDE ER 10 MG PO TB24: 10.0000 mg | ORAL_TABLET | Freq: Every day | ORAL | Status: DC

## 2015-08-03 NOTE — Progress Notes (Signed)
Subjective:    Patient ID: Amanda King, female    DOB: 26-Aug-1940, 75 y.o.   MRN: [id number]  HPI Here for f/u of chronic health problems  Wt is stable  bmi of 28 Eating too much junk food  Eating candy  Eating at night  Getting some exercise - working (very active job - "running" all night - 6.5 hours a night  Nothing outside of that except working on yard   bp is stable today  No cp or palpitations or headaches or edema  No side effects to medicines  BP Readings from Last 3 Encounters:  08/03/15 126/54  06/26/15 145/67  04/30/15 130/70      Diabetes Home sugar results  DM diet - has really been eating a lot more and worse choices -until 1 1/2 week ago  Exercise  Symptoms A1C last  Lab Results  Component Value Date   HGBA1C 9.4* 07/27/2015  that is up from 8   No problems with medications - last visit started long acting metformin xr - less GI intolerant - no diarrhea Still taking glipizide  Renal protection on ace  Last eye exam - know she is due- got a card in the mail    Cholesterol Lab Results  Component Value Date   CHOL 156 04/23/2015   HDL 39.30 04/23/2015   LDLCALC 73 06/21/2014   LDLDIRECT 76.0 04/23/2015   TRIG 270.0* 04/23/2015   CHOLHDL 4 04/23/2015   quit zetia  Price went way up - cannot afford it   Patient Active Problem List   Diagnosis Date Noted  . Narcolepsy with cataplexy 10/31/2014  . Stress reaction 03/28/2014  . Encounter for Medicare annual wellness exam 09/22/2013  . Hypersomnia, persistent 07/15/2013  . Foot abrasion 04/09/2012  . Other screening mammogram 12/31/2011  . Colon cancer screening 12/31/2011  . Complicated UTI (urinary tract infection) 11/14/2011  . Overweight 07/01/2011  . ANXIETY 05/06/2010  . DERMATOPHYTOSIS OF NAIL 12/21/2008  . Hypothyroidism 09/02/2006  . Diabetes type 2, uncontrolled ([place]) 09/02/2006  . Hyperlipidemia LDL goal <100 09/02/2006  . NARCOLEPSY W/CATAPLEXY 09/02/2006  . Essential  hypertension 09/02/2006  . PNEUMONIA, HX OF 09/02/2006   Past Medical History  Diagnosis Date  . Diabetes mellitus     Type II  . Hyperlipidemia   . Hypertension   . Hypothyroidism   . Pneumonia 08/2001  . Narcolepsy with cataplexy   . Migraine    Past Surgical History  Procedure Laterality Date  . Appendectomy  age 46  . Partial hysterectomy  age 31  . Posterior cervical fusion/foraminotomy  02/05/1975    C5-C6-C7  . Hemilaminectomy      C5-6; C6-7   Social History  Substance Use Topics  . Smoking status: Former Smoker    Quit date: 06/09/1980  . Smokeless tobacco: Never Used  . Alcohol Use: No   Family History  Problem Relation Age of Onset  . Heart disease Mother     MI  . Peripheral vascular disease Mother   . Hyperlipidemia Mother   . Depression Brother     suicide  . Diabetes Brother   . Diabetes Brother   . Sleep apnea Son   . Sleep apnea Brother    Allergies  Allergen Reactions  . Penicillins     REACTION: GI upset   Current Outpatient Prescriptions on File Prior to Visit  Medication Sig Dispense Refill  . aspirin 81 MG tablet Take 81 mg by mouth daily.      Marland Kitchen  Blood Glucose Monitoring Suppl (BLOOD GLUCOSE MONITOR KIT) KIT Check blood sugar once daily as needed for DM     . glipiZIDE (GLUCOTROL XL) 10 MG 24 hr tablet Take 1 tablet (10 mg total) by mouth daily with breakfast. 90 tablet 3  . glucose blood test strip Check blood sugar once daily as needed for DM.     . hydrochlorothiazide (HYDRODIURIL) 25 MG tablet TAKE 1 TABLET BY MOUTH EVERY DAY 30 tablet 3  . ibuprofen (ADVIL,MOTRIN) 200 MG tablet Take 200 mg by mouth every 6 (six) hours as needed.      Marland Kitchen imipramine (TOFRANIL) 50 MG tablet Take 1 tablet (50 mg total) by mouth at bedtime. 90 tablet 1  . levothyroxine (SYNTHROID, LEVOTHROID) 75 MCG tablet TAKE 1 TABLET (75 MCG TOTAL) BY MOUTH DAILY. 90 tablet 0  . lisinopril (PRINIVIL,ZESTRIL) 20 MG tablet Take 1 tablet (20 mg total) by mouth daily. 90  tablet 1  . metFORMIN (GLUCOPHAGE) 1000 MG tablet Take 1 tablet (1,000 mg total) by mouth daily. In pm 90 tablet 3  . metFORMIN (GLUCOPHAGE-XR) 750 MG 24 hr tablet Take one pill by mouth each evening 90 tablet 1  . methylphenidate (RITALIN) 20 MG tablet Take 1 tablet (20 mg total) by mouth 3 (three) times daily with meals. 180 tablet 0  . simvastatin (ZOCOR) 80 MG tablet TAKE 1 TABLET (80 MG TOTAL) BY MOUTH AT BEDTIME. 90 tablet 0   No current facility-administered medications on file prior to visit.     Review of Systems Review of Systems  Constitutional: Negative for fever, appetite change, fatigue and unexpected weight change.  Eyes: Negative for pain and visual disturbance.  Respiratory: Negative for cough and shortness of breath.   Cardiovascular: Negative for cp or palpitations    Gastrointestinal: Negative for nausea, diarrhea and constipation.  Genitourinary: Negative for urgency and frequency.  Skin: Negative for pallor or rash   Neurological: Negative for weakness, light-headedness, numbness and headaches.  Hematological: Negative for adenopathy. Does not bruise/bleed easily.  Psychiatric/Behavioral: Negative for dysphoric mood. The patient is not nervous/anxious.         Objective:   Physical Exam  Constitutional: She appears well-developed and well-nourished. No distress.  obese and well appearing   HENT:  Head: Normocephalic and atraumatic.  Mouth/Throat: Oropharynx is clear and moist.  Eyes: Conjunctivae and EOM are normal. Pupils are equal, round, and reactive to light.  Neck: Normal range of motion. Neck supple. No JVD present. Carotid bruit is not present. No thyromegaly present.  Cardiovascular: Normal rate, regular rhythm, normal heart sounds and intact distal pulses.  Exam reveals no gallop.   Pulmonary/Chest: Effort normal and breath sounds normal. No respiratory distress. She has no wheezes. She has no rales.  No crackles  Abdominal: Soft. Bowel sounds are  normal. She exhibits no distension, no abdominal bruit and no mass. There is no tenderness.  Musculoskeletal: She exhibits no edema.  Lymphadenopathy:    She has no cervical adenopathy.  Neurological: She is alert. She has normal reflexes.  Skin: Skin is warm and dry. No rash noted.  Psychiatric: She has a normal mood and affect.          Assessment & Plan:   Problem List Items Addressed This Visit      Cardiovascular and Mediastinum   Essential hypertension - Primary    bp in fair control at this time  BP Readings from Last 1 Encounters:  08/03/15 126/54   No changes needed  Disc lifstyle change with low sodium diet and exercise         Relevant Medications   lisinopril (PRINIVIL,ZESTRIL) 20 MG tablet   simvastatin (ZOCOR) 80 MG tablet     Endocrine   Diabetes type 2, uncontrolled ([facility])    Lab Results  Component Value Date   HGBA1C 9.4* 07/27/2015   This is up  Tolerates low dose metformin xr  Inc dose of that to bid  Watch glucose readings Get back on diabetic diet  F/u in 3 mo with lab prior       Relevant Medications   metFORMIN (GLUCOPHAGE-XR) 750 MG 24 hr tablet   lisinopril (PRINIVIL,ZESTRIL) 20 MG tablet   glipiZIDE (GLUCOTROL XL) 10 MG 24 hr tablet   simvastatin (ZOCOR) 80 MG tablet     Other   Hyperlipidemia LDL goal <100    Pt cannot afford zetia Suspect when price of generic goes down - she will continue to check with ins and pharmacies Disc goals for lipids and reasons to control them Rev labs with pt Rev low sat fat diet in detail       Relevant Medications   lisinopril (PRINIVIL,ZESTRIL) 20 MG tablet   simvastatin (ZOCOR) 80 MG tablet   Overweight    Discussed how this problem influences overall health and the risks it imposes  Reviewed plan for weight loss with lower calorie diet (via better food choices and also portion control or program like weight watchers) and exercise building up to or more than 30 minutes 5 days per week  including some aerobic activity          Other Visit Diagnoses    Need for vaccination with 13-polyvalent pneumococcal conjugate vaccine        Relevant Orders    Pneumococcal conjugate vaccine 13-valent (Completed)

## 2015-08-03 NOTE — Progress Notes (Signed)
Pre visit review using our clinic review tool, if applicable. No additional management support is needed unless otherwise documented below in the visit note. 

## 2015-08-03 NOTE — Patient Instructions (Addendum)
prevnar vaccine today  I will sent your shingles vaccine in  Hold zetia and then call pharmacy or ins co in 6 months to see if price has come down for generic  Get your eye exam  Increase metformin xr to 750 mg twice daily   At a better diabetic diet Exercise as much as you can fit in your schedule  Follow up in 3 months with labs prior

## 2015-08-05 NOTE — Assessment & Plan Note (Signed)
bp in fair control at this time  BP Readings from Last 1 Encounters:  08/03/15 126/54   No changes needed Disc lifstyle change with low sodium diet and exercise

## 2015-08-05 NOTE — Assessment & Plan Note (Signed)
Discussed how this problem influences overall health and the risks it imposes  Reviewed plan for weight loss with lower calorie diet (via better food choices and also portion control or program like weight watchers) and exercise building up to or more than 30 minutes 5 days per week including some aerobic activity    

## 2015-08-05 NOTE — Assessment & Plan Note (Addendum)
Lab Results  Component Value Date   HGBA1C 9.4* 07/27/2015   This is up  Tolerates low dose metformin xr  Inc dose of that to bid  Watch glucose readings Get back on diabetic diet  F/u in 3 mo with lab prior

## 2015-08-05 NOTE — Assessment & Plan Note (Signed)
Pt cannot afford zetia Suspect when price of generic goes down - she will continue to check with ins and pharmacies Disc goals for lipids and reasons to control them Rev labs with pt Rev low sat fat diet in detail

## 2015-08-14 ENCOUNTER — Other Ambulatory Visit: Payer: Self-pay | Admitting: Family Medicine

## 2015-09-23 ENCOUNTER — Other Ambulatory Visit: Payer: Self-pay | Admitting: Family Medicine

## 2015-10-16 ENCOUNTER — Other Ambulatory Visit: Payer: Self-pay | Admitting: Neurology

## 2015-10-16 DIAGNOSIS — G47411 Narcolepsy with cataplexy: Secondary | ICD-10-CM

## 2015-10-16 DIAGNOSIS — G4710 Hypersomnia, unspecified: Secondary | ICD-10-CM

## 2015-10-16 MED ORDER — METHYLPHENIDATE HCL 20 MG PO TABS: 20.0000 mg | ORAL_TABLET | Freq: Three times a day (TID) | ORAL | Status: DC

## 2015-10-16 NOTE — Telephone Encounter (Signed)
Patient requesting refill of methylphenidate (RITALIN) 20 MG tablet. ° ° °

## 2015-10-16 NOTE — Telephone Encounter (Signed)
Spoke to pt and advised her that her RX is ready for pick up at the front desk. Pt verbalized understanding.  

## 2015-10-20 ENCOUNTER — Telehealth: Payer: Self-pay | Admitting: Family Medicine

## 2015-10-20 DIAGNOSIS — E785 Hyperlipidemia, unspecified: Secondary | ICD-10-CM

## 2015-10-20 DIAGNOSIS — E1165 Type 2 diabetes mellitus with hyperglycemia: Principal | ICD-10-CM

## 2015-10-20 DIAGNOSIS — IMO0001 Reserved for inherently not codable concepts without codable children: Secondary | ICD-10-CM

## 2015-10-20 DIAGNOSIS — E039 Hypothyroidism, unspecified: Secondary | ICD-10-CM

## 2015-10-20 DIAGNOSIS — I10 Essential (primary) hypertension: Secondary | ICD-10-CM

## 2015-10-20 NOTE — Telephone Encounter (Signed)
-----   Message from Baldomero LamyNatasha C Chavers sent at 10/19/2015 11:29 AM EDT ----- Regarding: 3 mo f/u labs Mon 5/22, need orders. Thanks! :-) Please order future 3 mo f/u labs for pt's upcoming lab appt. Thanks Rodney Boozeasha

## 2015-10-24 ENCOUNTER — Other Ambulatory Visit: Payer: Self-pay | Admitting: Family Medicine

## 2015-10-29 ENCOUNTER — Other Ambulatory Visit (INDEPENDENT_AMBULATORY_CARE_PROVIDER_SITE_OTHER): Payer: Medicare Other

## 2015-10-29 DIAGNOSIS — E039 Hypothyroidism, unspecified: Secondary | ICD-10-CM | POA: Diagnosis not present

## 2015-10-29 DIAGNOSIS — E785 Hyperlipidemia, unspecified: Secondary | ICD-10-CM | POA: Diagnosis not present

## 2015-10-29 DIAGNOSIS — I10 Essential (primary) hypertension: Secondary | ICD-10-CM

## 2015-10-29 DIAGNOSIS — E1165 Type 2 diabetes mellitus with hyperglycemia: Secondary | ICD-10-CM

## 2015-10-29 DIAGNOSIS — IMO0001 Reserved for inherently not codable concepts without codable children: Secondary | ICD-10-CM

## 2015-10-29 LAB — COMPREHENSIVE METABOLIC PANEL
ALBUMIN: 4.5 g/dL (ref 3.5–5.2)
ALT: 24 U/L (ref 0–35)
AST: 19 U/L (ref 0–37)
Alkaline Phosphatase: 86 U/L (ref 39–117)
BUN: 26 mg/dL — AB (ref 6–23)
CHLORIDE: 102 meq/L (ref 96–112)
CO2: 28 meq/L (ref 19–32)
Calcium: 8.9 mg/dL (ref 8.4–10.5)
Creatinine, Ser: 1.01 mg/dL (ref 0.40–1.20)
GFR: 56.64 mL/min — ABNORMAL LOW (ref >60.00)
Glucose, Bld: 154 mg/dL — ABNORMAL HIGH (ref 70–99)
POTASSIUM: 3.6 meq/L (ref 3.5–5.1)
SODIUM: 138 meq/L (ref 135–145)
Total Bilirubin: 0.3 mg/dL (ref 0.2–1.2)
Total Protein: 7.1 g/dL (ref 6.0–8.3)

## 2015-10-29 LAB — LIPID PANEL
CHOL/HDL RATIO: 5
Cholesterol: 197 mg/dL (ref 0–200)
HDL: 36.1 mg/dL — AB (ref >39.00)
NonHDL: 160.58
Triglycerides: 277 mg/dL — ABNORMAL HIGH (ref 0.0–149.0)
VLDL: 55.4 mg/dL — AB (ref 0.0–40.0)

## 2015-10-29 LAB — HEMOGLOBIN A1C: HEMOGLOBIN A1C: 8.2 % — AB (ref 4.6–6.5)

## 2015-10-29 LAB — TSH: TSH: 3.9 u[IU]/mL (ref 0.35–4.50)

## 2015-10-29 LAB — LDL CHOLESTEROL, DIRECT: LDL DIRECT: 96 mg/dL

## 2015-10-31 ENCOUNTER — Ambulatory Visit: Payer: Medicare Other | Admitting: Neurology

## 2015-11-02 ENCOUNTER — Encounter: Payer: Self-pay | Admitting: Family Medicine

## 2015-11-02 ENCOUNTER — Ambulatory Visit (INDEPENDENT_AMBULATORY_CARE_PROVIDER_SITE_OTHER): Payer: Medicare Other | Admitting: Family Medicine

## 2015-11-02 VITALS — BP 130/64 | HR 93 | Temp 97.8°F | Ht 62.25 in | Wt 156.5 lb

## 2015-11-02 DIAGNOSIS — I10 Essential (primary) hypertension: Secondary | ICD-10-CM

## 2015-11-02 DIAGNOSIS — E1165 Type 2 diabetes mellitus with hyperglycemia: Secondary | ICD-10-CM

## 2015-11-02 DIAGNOSIS — IMO0001 Reserved for inherently not codable concepts without codable children: Secondary | ICD-10-CM

## 2015-11-02 DIAGNOSIS — E785 Hyperlipidemia, unspecified: Secondary | ICD-10-CM | POA: Diagnosis not present

## 2015-11-02 NOTE — Patient Instructions (Addendum)
You are maxed out on current diabetes medicines - but doing better with diet - A1C is improved  We can send you to a diabetic specialist if needed First- please call your insurance and get a list of diabetic medications that are affordable and perhaps we can choose one to try  For cholesterol   Avoid red meat/ fried foods/ egg yolks/ fatty breakfast meats/ butter, cheese and high fat dairy/ and shellfish   Also any exercise will help also  Don't forget to make your eye doctor appointment -this is important   Schedule physical at check out

## 2015-11-02 NOTE — Assessment & Plan Note (Signed)
Intolerant of metformin xr bid -diarrhea Went back to qd of 750 xr  Also glipizide Lab Results  Component Value Date   HGBA1C 8.2* 10/29/2015   Improved but not at goal Pt will check with ins re: other covered meds and get me a list  Consider endoc consult if needed    Pt desires oral medicine only if possible

## 2015-11-02 NOTE — Progress Notes (Signed)
Subjective:    Patient ID: Amanda King, female    DOB: 1940/11/19, 75 y.o.   MRN: [id number]  HPI Here for f/u of chronic health problems  Wt is down 3 lb with bmi of 28  bp is stable today  No cp or palpitations or headaches or edema  No side effects to medicines  BP Readings from Last 3 Encounters:  11/02/15 130/64  08/03/15 126/54  06/26/15 145/67     Diabetes Home sugar results -not a lot of high glucose readings -highest 165 DM diet -working on that / she is not eating snacks at work and eating less meat , more vegetables and fish   Staying away from sugar and sweets Exercise - at work, cannot fit in any more/ occ yard work Symptoms-none  A1C last  Lab Results  Component Value Date   HGBA1C 8.2* 10/29/2015  down from 9.4 with inc metformin   Intolerant of higher dose of metformin -dropped back down after a week - only taking one 750 daily Also glucotrol  Renal protection-on ace Last eye exam -is due to go -she will schedule her own appt  Hyperlipidemia Lab Results  Component Value Date   CHOL 197 10/29/2015   CHOL 156 04/23/2015   CHOL 153 01/16/2015   Lab Results  Component Value Date   HDL 36.10* 10/29/2015   HDL 39.30 04/23/2015   HDL 38.70* 01/16/2015   Lab Results  Component Value Date   LDLCALC 73 06/21/2014   LDLCALC 49 09/14/2013   LDLCALC 51 12/31/2011   Lab Results  Component Value Date   TRIG 277.0* 10/29/2015   TRIG 270.0* 04/23/2015   TRIG 299.0* 01/16/2015   Lab Results  Component Value Date   CHOLHDL 5 10/29/2015   CHOLHDL 4 04/23/2015   CHOLHDL 4 01/16/2015   Lab Results  Component Value Date   LDLDIRECT 96.0 10/29/2015   LDLDIRECT 76.0 04/23/2015   LDLDIRECT 71.0 01/16/2015   on simvastatin 80  overall a little worse with HDL and LDL   Reviewed health habits including diet and exercise and skin cancer prevention Reviewed appropriate screening tests for age  Also reviewed health mt list, fam hx and immunization  status , as well as social and family history    Review of Systems Review of Systems  Constitutional: Negative for fever, appetite change, fatigue and unexpected weight change.  Eyes: Negative for pain and visual disturbance.  Respiratory: Negative for cough and shortness of breath.   Cardiovascular: Negative for cp or palpitations    Gastrointestinal: Negative for nausea, diarrhea and constipation.  Genitourinary: Negative for urgency and frequency.  Skin: Negative for pallor or rash   Neurological: Negative for weakness, light-headedness, numbness and headaches.  Hematological: Negative for adenopathy. Does not bruise/bleed easily.  Psychiatric/Behavioral: Negative for dysphoric mood. The patient is not nervous/anxious.         Objective:   Physical Exam  Constitutional: She appears well-developed and well-nourished. No distress.  obese and well appearing   HENT:  Head: Normocephalic and atraumatic.  Mouth/Throat: Oropharynx is clear and moist.  Eyes: Conjunctivae and EOM are normal. Pupils are equal, round, and reactive to light.  Neck: Normal range of motion. Neck supple. No JVD present. Carotid bruit is not present. No thyromegaly present.  Cardiovascular: Normal rate, regular rhythm, normal heart sounds and intact distal pulses.  Exam reveals no gallop.   Pulmonary/Chest: Effort normal and breath sounds normal. No respiratory distress. She has no wheezes. She  has no rales.  No crackles  Abdominal: Soft. Bowel sounds are normal. She exhibits no distension, no abdominal bruit and no mass. There is no tenderness.  Musculoskeletal: She exhibits no edema.  Lymphadenopathy:    She has no cervical adenopathy.  Neurological: She is alert. She has normal reflexes.  Skin: Skin is warm and dry. No rash noted.  Psychiatric: She has a normal mood and affect.          Assessment & Plan:   Problem List Items Addressed This Visit      Cardiovascular and Mediastinum   Essential  hypertension - Primary    bp in fair control at this time  BP Readings from Last 1 Encounters:  11/02/15 130/64   No changes needed Disc lifstyle change with low sodium diet and exercise  Labs reviewed         Endocrine   Diabetes type 2, uncontrolled ([facility])    Intolerant of metformin xr bid -diarrhea Went back to qd of 750 xr  Also glipizide Lab Results  Component Value Date   HGBA1C 8.2* 10/29/2015   Improved but not at goal Pt will check with ins re: other covered meds and get me a list  Consider endoc consult if needed    Pt desires oral medicine only if possible         Other   Hyperlipidemia LDL goal <100    Disc goals for lipids and reasons to control them Rev labs with pt Rev low sat fat diet in detail Not as good this time  On max dose of simvastatin  Will see if diet change helps and re check at next appt

## 2015-11-02 NOTE — Progress Notes (Signed)
Pre visit review using our clinic review tool, if applicable. No additional management support is needed unless otherwise documented below in the visit note. 

## 2015-11-04 NOTE — Assessment & Plan Note (Signed)
bp in fair control at this time  BP Readings from Last 1 Encounters:  11/02/15 130/64   No changes needed Disc lifstyle change with low sodium diet and exercise  Labs reviewed

## 2015-11-04 NOTE — Assessment & Plan Note (Signed)
Disc goals for lipids and reasons to control them Rev labs with pt Rev low sat fat diet in detail Not as good this time  On max dose of simvastatin  Will see if diet change helps and re check at next appt

## 2015-12-12 ENCOUNTER — Other Ambulatory Visit: Payer: Self-pay | Admitting: Family Medicine

## 2015-12-24 ENCOUNTER — Ambulatory Visit (INDEPENDENT_AMBULATORY_CARE_PROVIDER_SITE_OTHER): Payer: Medicare Other | Admitting: Neurology

## 2015-12-24 ENCOUNTER — Encounter: Payer: Self-pay | Admitting: Neurology

## 2015-12-24 VITALS — BP 138/68 | HR 90 | Resp 20 | Ht 62.0 in | Wt 158.0 lb

## 2015-12-24 DIAGNOSIS — G4710 Hypersomnia, unspecified: Secondary | ICD-10-CM | POA: Diagnosis not present

## 2015-12-24 DIAGNOSIS — G47411 Narcolepsy with cataplexy: Secondary | ICD-10-CM | POA: Diagnosis not present

## 2015-12-24 MED ORDER — METHYLPHENIDATE HCL 20 MG PO TABS: 20.0000 mg | ORAL_TABLET | Freq: Three times a day (TID) | ORAL | Status: DC

## 2015-12-24 MED ORDER — IMIPRAMINE HCL 50 MG PO TABS: 50.0000 mg | ORAL_TABLET | Freq: Every day | ORAL | Status: DC

## 2015-12-24 NOTE — Patient Instructions (Addendum)
Narcolepsy Narcolepsy is a nervous system disorder that causes daytime sleepiness and sudden bouts of irresistible sleep during the day (sleep attacks). Many people with narcolepsy live with extreme daytime sleepiness for many years before being diagnosed and treated. Narcolepsy is a lifelong (chronic) disorder.  You normally go through cycles when you sleep. When your sleep becomes deeper, you have less body movement, and you start dreaming. This type of deep sleep should happen after about 90 minutes of lighter sleep. Deep sleep is called rapid eye movement (REM) sleep. When you have narcolepsy, REM is not well regulated. It can happen as soon as you fall asleep, and components of REM sleep can occur during the day when you are awake. Uncontrolled REM sleep causes symptoms of narcolepsy. CAUSES Narcolepsy may be caused by an abnormality with a chemical messenger (neurotransmitter) in your brain that controls your sleep and wake cycles. Most people with narcolepsy have low levels of the neurotransmitter hypocretin. Hypocretin is important for controlling wakefulness.  A hypocretin imbalance may be caused by abnormal genes that are passed down through families. It may also develop if the body's defense system (immune system) mistakenly attacks the brain cells that produce hypocretin (autoimmune disease). RISK FACTORS You may be at higher risk for narcolepsy if you have a family history of the disease. Other risk factors that may contribute include:  Injuries, tumors, or infections in the areas of the brain that control sleep.  Exposure to toxins.  Stress.  Hormones produced during puberty and menopause.  Poor sleep habits. SIGNS AND SYMPTOMS  Symptoms of narcolepsy can start at any age but usually begin when people are between the ages of 7 and 25 years. There are four major symptoms. Not everyone with narcolepsy will have all four.   Excessive daytime sleepiness. This is the most common symptom  and is usually the first symptom you will notice.  You may feel as if you are in a mental fog.  Daytime sleepiness may severely affect your performance at work or school.  You may fall asleep during a conversation or while eating dinner.  Sudden loss of muscle tone (cataplexy). You do not lose consciousness, but you may suddenly lose muscle control. When this occurs, your speech may become slurred, or your knees may buckle. This symptom is usually triggered by surprise, anger, or laughter.  Sleep paralysis. You may lose the ability to speak or move just as you start to fall asleep or wake up. You will be aware of the paralysis. It usually lasts for just a few seconds or minutes.  Vivid hallucinations. These may occur with sleep paralysis. The hallucinations are like having bizarre or frightening dreams while you are still awake. Other symptoms may include:   Trouble staying asleep at night (insomnia).  Restless sleep.  Feeling a strong urge to get up at night to smoke or eat. DIAGNOSIS  Your health care provider can diagnose narcolepsy based on your symptoms and the results of two diagnostic tests. You may also be asked to keep a sleep diary for several weeks. You may need the tests if you have had daytime sleepiness for at least 3 months. The two tests are:   A polysomnogram to find out how well your REM sleep is regulated at night. This test is an overnight sleep study. It measures your heart rate, breathing, movement, and brain waves.  A multiple sleep latency test (MSLT) to find out how well your REM sleep is regulated during the day. This   is a daytime sleep study. You may need to take several naps during the day. This test also measures your heart rate, breathing, movement, and brain waves. TREATMENT  There is no cure for narcolepsy, but treatment can be very effective in helping manage the condition. Treatment may include:  Lifestyle and sleeping strategies to help cope with the  condition.  Medicines. These may include:  Stimulant medicines to fight daytime sleepiness.  Antidepressant medicines to treat cataplexy.  Sodium oxybate. This is a strong sedative that you take at night. It can help daytime sleepiness and cataplexy. HOME CARE INSTRUCTIONS  Take all medicines as directed by your health care provider.  Follow these sleep practices:  Try to get about 8 hours of sleep every night.  Go to sleep and get up close to the same time every day.  Keep your bedroom dark, quiet, and comfortable.  Schedule short naps for when you feel sleepiest during the day. Tell your employer or teachers that you have narcolepsy. You may be able to adjust your schedule to include time for naps.  Try to get at least 20 minutes of exercise every day. This will help you sleep better at night and reduce daytime sleepiness.  Do not drink alcohol or caffeinated beverages within 4-5 hours of bedtime.  Do not eat a heavy meal before bedtime. Eat at about the same times every day.  Do not smoke.  Do not drive if you are sleepy or have untreated narcolepsy.  Do not swim or go out on the water without a life jacket. SEEK MEDICAL CARE IF: Your medicines are not controlling your narcolepsy symptoms. SEEK IMMEDIATE MEDICAL CARE IF:  You hurt yourself during a sleep attack or an attack of cataplexy.  You have chest pain or trouble breathing.   This information is not intended to replace advice given to you by your health care provider. Make sure you discuss any questions you have with your health care provider.   Document Released: 05/16/2002 Document Revised: 06/16/2014 Document Reviewed: 05/18/2013 Elsevier Interactive Patient Education 2016 [employer]   Please have Dr. Milinda Antisower follow your CMET ( metabolic panel) .   RV in 6 month with NP.   Melvyn NovasHMEIER,Victory Strollo, MD

## 2015-12-24 NOTE — Progress Notes (Signed)
[facility]  Provider:  Larey Seat, M D  Referring Provider: Abner Greenspan, MD Primary Care Physician:  Loura Pardon, MD  Chief Complaint  Patient presents with  . Follow-up    says things are going well    HPI:  Amanda King is a 75 y.o. female seen here as a revisit  from Dr. Glori Bickers for follow up of symptoms of Hypersomnia, RV from 10-31-14 .   I had last seen Amanda King in 2013 after she was referred to me with a diagnosis of narcolepsy with cataplexy.  She had been treated on generic imipramine and Ritalin;   50  mg of imipramine at night , and 20 mg of methylphenidate 4 times daily. In  2013 she described that in spite of the medication she had a cataplectic attack related to the emotionally upsetting news  of her granddaughter's father-in-law's death.  She reported rare sleep hallucinations and had been followed by Dr. Morrell Riddle , Dr. Earley Favor  and was Dr. Tressia Danas patient until his retirement.  The patient states that the narcolepsy was diagnosed  after 1989 with cataplectic attacks.;  she was unsure of the was any viral onset and that she has no family history of narcolepsy but at the time underwent a neck surgery with myelogram and somehow  Remained always sleepier since that time.  She reported ongoing cataplectic spells whenever she tried to change dosing of her medication - she had been using Tofranil as the brand name to sleep  but was due to costs and insurance coverage forced to change to generic imipramine, which triggered additional cataplectic attacks.  We have discussed Xyrem in our last visit, but postponed the initiation as she felt that her current regimen was sufficient. The patient initiates bedtime normally at 11 PM, she will fall asleep promptly, times a week Mondays, Wednesdays and Fridays she will rise at 7 AM to go to the gym. Spontaneously she will wake up between 7 and 8 on other days as well. During the day she often takes a nap on her  couch, about 45 minutes duration that she feels is refreshing. The nocturnal sleep time is estimated to be around 8 hours as well. He can be fragmented but visit dreams but he denied any nightmares or any "acting out "of dreams. She is very emotional at funerals and had multiple cataplectic spells in that setting.  The patient just retired from [employer]. after 24 years and is now rearranging her day-to-day life. 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  She isn't from a smoker but quit over 32 years ago and she has not used alcohol or recreational drugs. She would drink up to 3 cups of caffeinated beverages per day.   Rv 10-31-14; Amanda King is here today for which he revisit and refills. She reports doing fine, he had no paralyzing spells recently no nightmares, no dream intrusion. I asked her about cataplexy and she said it right now she seems to be free of so spells. There is that the only time she experiences cataplexy now is when she is really upsets or last time she was at a funeral and grieving. She endorsed today the Epworth sleepiness score at 12 points down from 13 and the fatigue severity score at 12  points. Revisit from 12-20-14 Amanda King has been diagnosed with narcolepsy and cataplexy she has clinically all the necessary symptoms and an HLA test was positive for the HLA he he 1. We were finally unable in May to start her on Xyrem , and she is now on TOFRANIL which she has tolerated well but she takes 1 nocturnal dose.  She takes up to 4 times a day stimulant medication her case Ritalin. Sometimes she can get by this 2 or 3 doses only. It is remarkable that she has found new employment and the energy to work now 6 days a week. Her treatment made the difference.   Interval history from 12/24/2015, Amanda King reports today that  she has done very well with her current medical regimen and treatment of narcolepsy. She endorsed 7 points on the Epworth sleepiness score, 9 points on the fatigue severity score and 0 on the geriatric depression score. She has decided that she cannot retire and will work 3 times a week and this keeps her active and happy. She also just told me that her granddaughter's are 75 and 33 years old and that she has already  2 great-grandchildren 80 years old !  Review of Systems: 12-19-14  Out of a complete 14 system review, the patient complains of only the following symptoms, and all other reviewed systems are negative. The patient endorsed the fatigue severity scale at 7 from 12 , and the Epworth Sleepiness Scale at  2fom 12 points , and the geriatric depression score at 0 point. She has significant loss of hearing and wears hearing aids bilaterally.   She does now  exercise.  She joined a gym, [employer] Her son installed some exercise equipment in her upstairs bedroom.   Social History   Social History  . Marital Status: Widowed    Spouse Name: N/A  . Number of Children: 3  . Years of Education: GED   Occupational History  .     Social History Main Topics  . Smoking status: Former Smoker    Quit date: 06/09/1980  . Smokeless tobacco: Never Used  . Alcohol Use: No  . Drug Use: No  . Sexual Activity: Not on file   Other Topics Concern  . Not on file   Social History Narrative   Patient is widowed. and her son lives with her.   Walks for exercise.     Patient has three children.   Patient is right-handed.   Patient drinks three cups of caffeine daily.     Patient is retired.   Patient has a GED.    Family History  Problem Relation Age of Onset  . Heart disease Mother     MI  . Peripheral vascular disease Mother   . Hyperlipidemia Mother   . Depression Brother     suicide  . Diabetes Brother   . Diabetes Brother   . Sleep apnea Son   . Sleep apnea Brother      Past Medical History  Diagnosis Date  . Diabetes mellitus     Type II  . Hyperlipidemia   . Hypertension   . Hypothyroidism   . Pneumonia 08/2001  . Narcolepsy with cataplexy   . Migraine     Past Surgical History  Procedure Laterality Date  . Appendectomy  age 89 . Partial hysterectomy  age 75 . Posterior cervical fusion/foraminotomy  02/05/1975    C5-C6-C7  . Hemilaminectomy      C5-6; C6-7  Current Outpatient Prescriptions  Medication Sig Dispense Refill  . aspirin 81 MG tablet Take 81 mg by mouth daily.      . Blood Glucose Monitoring Suppl (BLOOD GLUCOSE MONITOR KIT) KIT Check blood sugar once daily as needed for DM     . cetirizine (ZYRTEC) 10 MG tablet Take 10 mg by mouth daily.    Marland Kitchen glipiZIDE (GLUCOTROL XL) 10 MG 24 hr tablet Take 1 tablet (10 mg total) by mouth daily with breakfast. 90 tablet 3  . glucose blood test strip Check blood sugar once daily as needed for DM.     . hydrochlorothiazide (HYDRODIURIL) 25 MG tablet TAKE ONE TABLET BY MOUTH ONCE DAILY 30 tablet 5  . ibuprofen (ADVIL,MOTRIN) 200 MG tablet Take 200 mg by mouth every 6 (six) hours as needed.      Marland Kitchen imipramine (TOFRANIL) 50 MG tablet Take 1 tablet (50 mg total) by mouth at bedtime. 90 tablet 1  . levothyroxine (SYNTHROID, LEVOTHROID) 75 MCG tablet TAKE 1 TABLET (75 MCG TOTAL) BY MOUTH DAILY. 90 tablet 3  . lisinopril (PRINIVIL,ZESTRIL) 20 MG tablet Take 1 tablet (20 mg total) by mouth daily. 90 tablet 3  . metFORMIN (GLUCOPHAGE-XR) 750 MG 24 hr tablet Take 1 tablet (750 mg total) by mouth 2 (two) times daily. 60 tablet 11  . methylphenidate (RITALIN) 20 MG tablet Take 1 tablet (20 mg total) by mouth 3 (three) times daily with meals. 180 tablet 0  . simvastatin (ZOCOR) 80 MG tablet TAKE 1 TABLET (80 MG TOTAL) BY MOUTH AT BEDTIME. 90 tablet 3  . zoster vaccine live, PF, (ZOSTAVAX) 09326 UNT/0.65ML injection Inject 19,400 Units into the skin once. 1 vial 0   No current facility-administered  medications for this visit.    Allergies as of 12/24/2015 - Review Complete 12/24/2015  Allergen Reaction Noted  . Penicillins  04/02/2007    Vitals: BP 138/68 mmHg  Pulse 90  Resp 20  Ht '5\' 2"'  (1.575 m)  Wt 158 lb (71.668 kg)  BMI 28.89 kg/m2 Last Weight:  Wt Readings from Last 1 Encounters:  12/24/15 158 lb (71.668 kg)   Last Height:   Ht Readings from Last 1 Encounters:  12/24/15 '5\' 2"'  (1.575 m)   Physical exam: stable weight for years,   General: The patient is awake, alert and appears not in acute distress. The patient is well groomed. Head: Normocephalic, atraumatic. Neck is supple. Mallampati 3 , neck circumference:15.25 . Cardiovascular:  Regular rate and rhythm, without  murmurs or carotid bruit, and without distended neck veins. Respiratory: Lungs are clear to auscultation. Skin:  Without evidence of edema, or rash Trunk: BMI is  elevated and patient  has normal posture.  Neurologic exam : The patient is awake and alert, oriented to place and time.  Memory subjective  described as intact.  There is a normal attention span & concentration ability.  Speech is fluent without dysarthria, but mld dysphonia noted, not aphasia.  Mood and affect are appropriate.  Cranial nerves: No lost of taste or smell. Pupils are equal and briskly reactive to light. Funduscopic exam without  evidence of pallor or edema.  Extraocular movements  in vertical and horizontal planes intact and without nystagmus. Visual fields by finger perimetry are intact. Hearing to finger rub intact with hearing aids. . Facial sensation intact to fine touch.  Facial motor strength is symmetric and tongue and uvula move midline.  Motor exam:   Normal tone and normal muscle bulk and symmetric normal  strength in all extremities.   Assessment:  After physical and neurologic examination, review of laboratory studies, imaging, neurophysiology testing and this patient carries the diagnosis of cataplexy and  narcolepsy in 1984, after suffering severe cataplectic attacks.  Sleep hallucinations are rare now.  There is no longer access to the original sleep studies. Plan:  Treatment plan and additional workup :  Intended to have Amanda King back for and a sleep study followed by an M SLT but she was unable to stay off her medicine especially now that she needs to maintain employment. His and I will indefinitely postpone a repeat sleep study she is doing well right now on Tofranil and Ritalin and if we need to place her on Xyrem in the future we would have to do these studies first.  HLA testing for narcolepsy with cataplexy was positive for HLA - she has clincally all symptoms and she is doing better on Tofranil and Ritalin, but was denied XYREM,  medicare coverage has finally failed for this patient. She had once not taking the medication for 4 days and suffered from severe sleep a hallucinations, vivid and scary dreams came back. 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     I will Refill Tofranil,  Ritalin, prn , but usually twice daily. Marland Kitchen     Benjamine Strout, MD   Cc; Dr Glori Bickers

## 2016-01-03 ENCOUNTER — Other Ambulatory Visit: Payer: Self-pay | Admitting: Family Medicine

## 2016-01-31 ENCOUNTER — Telehealth: Payer: Self-pay | Admitting: Family Medicine

## 2016-01-31 DIAGNOSIS — IMO0001 Reserved for inherently not codable concepts without codable children: Secondary | ICD-10-CM

## 2016-01-31 DIAGNOSIS — Z0000 Encounter for general adult medical examination without abnormal findings: Secondary | ICD-10-CM | POA: Insufficient documentation

## 2016-01-31 DIAGNOSIS — E1165 Type 2 diabetes mellitus with hyperglycemia: Principal | ICD-10-CM

## 2016-01-31 NOTE — Telephone Encounter (Signed)
-----   Message from Baldomero LamyNatasha C Chavers sent at 01/30/2016  1:38 PM EDT ----- Regarding: Cpx labs Tues 8/29, need orders. Thanks! :-) Please order  future cpx labs for pt's upcoming lab appt. Thanks Rodney Boozeasha

## 2016-02-05 ENCOUNTER — Other Ambulatory Visit (INDEPENDENT_AMBULATORY_CARE_PROVIDER_SITE_OTHER): Payer: Medicare Other

## 2016-02-05 ENCOUNTER — Other Ambulatory Visit: Payer: Medicare Other

## 2016-02-05 ENCOUNTER — Ambulatory Visit: Payer: Medicare Other

## 2016-02-05 DIAGNOSIS — E1165 Type 2 diabetes mellitus with hyperglycemia: Secondary | ICD-10-CM | POA: Diagnosis not present

## 2016-02-05 DIAGNOSIS — R7989 Other specified abnormal findings of blood chemistry: Secondary | ICD-10-CM

## 2016-02-05 DIAGNOSIS — Z0000 Encounter for general adult medical examination without abnormal findings: Secondary | ICD-10-CM | POA: Diagnosis not present

## 2016-02-05 DIAGNOSIS — IMO0001 Reserved for inherently not codable concepts without codable children: Secondary | ICD-10-CM

## 2016-02-05 LAB — COMPREHENSIVE METABOLIC PANEL
ALT: 28 U/L (ref 0–35)
AST: 22 U/L (ref 0–37)
Albumin: 4.2 g/dL (ref 3.5–5.2)
Alkaline Phosphatase: 91 U/L (ref 39–117)
BUN: 25 mg/dL — AB (ref 6–23)
CHLORIDE: 98 meq/L (ref 96–112)
CO2: 31 meq/L (ref 19–32)
CREATININE: 1 mg/dL (ref 0.40–1.20)
Calcium: 8.8 mg/dL (ref 8.4–10.5)
GFR: 57.25 mL/min — ABNORMAL LOW (ref >60.00)
Glucose, Bld: 178 mg/dL — ABNORMAL HIGH (ref 70–99)
POTASSIUM: 3.9 meq/L (ref 3.5–5.1)
SODIUM: 136 meq/L (ref 135–145)
Total Bilirubin: 0.5 mg/dL (ref 0.2–1.2)
Total Protein: 7.1 g/dL (ref 6.0–8.3)

## 2016-02-05 LAB — CBC WITH DIFFERENTIAL/PLATELET
BASOS PCT: 0.4 % (ref 0.0–3.0)
Basophils Absolute: 0 10*3/uL (ref 0.0–0.1)
EOS ABS: 0.2 10*3/uL (ref 0.0–0.7)
EOS PCT: 2.2 % (ref 0.0–5.0)
HCT: 36 % (ref 36.0–46.0)
HEMOGLOBIN: 12.4 g/dL (ref 12.0–15.0)
LYMPHS ABS: 2.3 10*3/uL (ref 0.7–4.0)
Lymphocytes Relative: 30.1 % (ref 12.0–46.0)
MCHC: 34.6 g/dL (ref 30.0–36.0)
MCV: 87.9 fl (ref 78.0–100.0)
MONO ABS: 0.5 10*3/uL (ref 0.1–1.0)
Monocytes Relative: 6.7 % (ref 3.0–12.0)
NEUTROS PCT: 60.6 % (ref 43.0–77.0)
Neutro Abs: 4.6 10*3/uL (ref 1.4–7.7)
PLATELETS: 251 10*3/uL (ref 150.0–400.0)
RBC: 4.09 Mil/uL (ref 3.87–5.11)
RDW: 13 % (ref 11.5–15.5)
WBC: 7.5 10*3/uL (ref 4.0–10.5)

## 2016-02-05 LAB — LIPID PANEL
Cholesterol: 213 mg/dL — ABNORMAL HIGH (ref 0–200)
HDL: 42.3 mg/dL (ref >39.00)
NonHDL: 170.33
TRIGLYCERIDES: 385 mg/dL — AB (ref 0.0–149.0)
Total CHOL/HDL Ratio: 5
VLDL: 77 mg/dL — AB (ref 0.0–40.0)

## 2016-02-05 LAB — LDL CHOLESTEROL, DIRECT: LDL DIRECT: 102 mg/dL

## 2016-02-05 LAB — HEMOGLOBIN A1C: Hgb A1c MFr Bld: 8 % — ABNORMAL HIGH (ref 4.6–6.5)

## 2016-02-05 LAB — TSH: TSH: 4.82 u[IU]/mL — AB (ref 0.35–4.50)

## 2016-02-14 ENCOUNTER — Ambulatory Visit: Payer: Medicare Other

## 2016-02-15 ENCOUNTER — Ambulatory Visit (INDEPENDENT_AMBULATORY_CARE_PROVIDER_SITE_OTHER): Payer: Medicare Other | Admitting: Family Medicine

## 2016-02-15 ENCOUNTER — Encounter: Payer: Self-pay | Admitting: Family Medicine

## 2016-02-15 VITALS — BP 142/62 | HR 89 | Temp 97.5°F | Ht 61.25 in | Wt 158.0 lb

## 2016-02-15 DIAGNOSIS — IMO0001 Reserved for inherently not codable concepts without codable children: Secondary | ICD-10-CM

## 2016-02-15 DIAGNOSIS — E039 Hypothyroidism, unspecified: Secondary | ICD-10-CM | POA: Diagnosis not present

## 2016-02-15 DIAGNOSIS — E1165 Type 2 diabetes mellitus with hyperglycemia: Secondary | ICD-10-CM

## 2016-02-15 DIAGNOSIS — Z0000 Encounter for general adult medical examination without abnormal findings: Secondary | ICD-10-CM

## 2016-02-15 DIAGNOSIS — I10 Essential (primary) hypertension: Secondary | ICD-10-CM | POA: Diagnosis not present

## 2016-02-15 DIAGNOSIS — Z23 Encounter for immunization: Secondary | ICD-10-CM | POA: Diagnosis not present

## 2016-02-15 DIAGNOSIS — E785 Hyperlipidemia, unspecified: Secondary | ICD-10-CM

## 2016-02-15 DIAGNOSIS — Z1211 Encounter for screening for malignant neoplasm of colon: Secondary | ICD-10-CM

## 2016-02-15 NOTE — Assessment & Plan Note (Signed)
Triglycerides are up (poss due to glucose) Disc goals for lipids and reasons to control them Rev labs with pt Rev low sat fat diet in detail Will work on diet and sugar control Re check 3 mo and then f/u  Continue simvastatin

## 2016-02-15 NOTE — Assessment & Plan Note (Signed)
cologuard -will enroll in the program Has declined colonoscopies in the past

## 2016-02-15 NOTE — Assessment & Plan Note (Signed)
Lab Results  Component Value Date   TSH 4.82 (H) 02/05/2016   Up a bit with 2 recent missed doses Will get back on track with compliance  F/u 3 mo with re check

## 2016-02-15 NOTE — Assessment & Plan Note (Signed)
bp in fair control at this time (up slightly- just took medicine) BP Readings from Last 1 Encounters:  02/15/16 (!) 142/62   No changes needed Disc lifstyle change with low sodium diet and exercise  Will re check next time -is up a little

## 2016-02-15 NOTE — Progress Notes (Signed)
Subjective:    Patient ID: Amanda King, female    DOB: [birth year], 75 y.o.   MRN: [id number]  HPI Here for health maintenance exam and to review chronic medical problems    Feeling ok overall  She has been working long shifts 3 to midnight - will be improving this week  Wants to keep working and she does really like the people she works with   Has not had AMW visit yet   Wt Readings from Last 3 Encounters:  02/15/16 158 lb (71.7 kg)  12/24/15 158 lb (71.7 kg)  11/02/15 156 lb 8 oz (71 kg)  no change  Trying to eat a healthy diet- working on it with her son  She is using his exercise equipment- 15 minutes daily to start  Perhaps walking as the weather cools down  bmi is 29.6  Eye exam-cannot afford (300$)- will check with her insurance again   Flu shot-today   Zoster vaccine -not covered by ins -cannot afford   dexa -declines  Takes ca and D  No falls or fractures   Colon cancer screening IFOB 7/15 Is interested in the cologuard program  Never had a colonoscopy   bp is up on first check  No cp or palpitations or headaches or edema  No side effects to medicines  BP Readings from Last 3 Encounters:  02/15/16 (!) 142/62  12/24/15 138/68  11/02/15 130/64     Hypothyroidism  Pt has no clinical changes No change in energy level/ hair or skin/ edema and no tremor Lab Results  Component Value Date   TSH 4.82 (H) 02/05/2016   she missed some doses after forgetting to pick up med - missed 2 d recently    Hx of DM2 Lab Results  Component Value Date   HGBA1C 8.0 (H) 02/05/2016  a little improved- but still quite high  This is down from 8.2 On metformin xr once daily  and glipizide  She has not found a list of covered medicine yet She has been eating cookies and potato chips    Hx of hyperlipidemia Lab Results  Component Value Date   CHOL 213 (H) 02/05/2016   CHOL 197 10/29/2015   CHOL 156 04/23/2015   Lab Results  Component Value Date   HDL 42.30  02/05/2016   HDL 36.10 (L) 10/29/2015   HDL 39.30 04/23/2015   Lab Results  Component Value Date   LDLCALC 73 06/21/2014   LDLCALC 49 09/14/2013   LDLCALC 51 12/31/2011   Lab Results  Component Value Date   TRIG 385.0 (H) 02/05/2016   TRIG 277.0 (H) 10/29/2015   TRIG 270.0 (H) 04/23/2015   Lab Results  Component Value Date   CHOLHDL 5 02/05/2016   CHOLHDL 5 10/29/2015   CHOLHDL 4 04/23/2015   Lab Results  Component Value Date   LDLDIRECT 102.0 02/05/2016   LDLDIRECT 96.0 10/29/2015   LDLDIRECT 76.0 04/23/2015   too many carbs - that may raise her triglycerides  On simvastatin and diet   Patient Active Problem List   Diagnosis Date Noted  . Routine general medical examination at a health care facility 01/31/2016  . Narcolepsy with cataplexy 10/31/2014  . Stress reaction 03/28/2014  . Encounter for Medicare annual wellness exam 09/22/2013  . Hypersomnia, persistent 07/15/2013  . Foot abrasion 04/09/2012  . Other screening mammogram 12/31/2011  . Colon cancer screening 12/31/2011  . Overweight 07/01/2011  . ANXIETY 05/06/2010  . DERMATOPHYTOSIS OF NAIL 12/21/2008  .  Hypothyroidism 09/02/2006  . Diabetes type 2, uncontrolled ([place]) 09/02/2006  . Hyperlipidemia LDL goal <100 09/02/2006  . NARCOLEPSY W/CATAPLEXY 09/02/2006  . Essential hypertension 09/02/2006  . PNEUMONIA, HX OF 09/02/2006   Past Medical History:  Diagnosis Date  . Diabetes mellitus    Type II  . Hyperlipidemia   . Hypertension   . Hypothyroidism   . Migraine   . Narcolepsy with cataplexy   . Pneumonia 08/2001   Past Surgical History:  Procedure Laterality Date  . APPENDECTOMY  age 21  . hemilaminectomy     C5-6; C6-7  . PARTIAL HYSTERECTOMY  age 68  . POSTERIOR CERVICAL FUSION/FORAMINOTOMY  02/05/1975   C5-C6-C7   Social History  Substance Use Topics  . Smoking status: Former Smoker    Quit date: 06/09/1980  . Smokeless tobacco: Never Used  . Alcohol use No   Family History  Problem  Relation Age of Onset  . Heart disease Mother     MI  . Peripheral vascular disease Mother   . Hyperlipidemia Mother   . Depression Brother     suicide  . Diabetes Brother   . Diabetes Brother   . Sleep apnea Son   . Sleep apnea Brother    Allergies  Allergen Reactions  . Penicillins     REACTION: GI upset   Current Outpatient Prescriptions on File Prior to Visit  Medication Sig Dispense Refill  . aspirin 81 MG tablet Take 81 mg by mouth daily.      . Blood Glucose Monitoring Suppl (BLOOD GLUCOSE MONITOR KIT) KIT Check blood sugar once daily as needed for DM     . cetirizine (ZYRTEC) 10 MG tablet Take 10 mg by mouth daily.    Marland Kitchen glipiZIDE (GLUCOTROL XL) 10 MG 24 hr tablet Take 1 tablet (10 mg total) by mouth daily with breakfast. 90 tablet 3  . glucose blood test strip Check blood sugar once daily as needed for DM.     . hydrochlorothiazide (HYDRODIURIL) 25 MG tablet TAKE ONE TABLET BY MOUTH ONCE DAILY 30 tablet 5  . ibuprofen (ADVIL,MOTRIN) 200 MG tablet Take 200 mg by mouth every 6 (six) hours as needed.      Marland Kitchen imipramine (TOFRANIL) 50 MG tablet Take 1 tablet (50 mg total) by mouth at bedtime. 90 tablet 1  . levothyroxine (SYNTHROID, LEVOTHROID) 75 MCG tablet TAKE 1 TABLET (75 MCG TOTAL) BY MOUTH DAILY. 90 tablet 3  . lisinopril (PRINIVIL,ZESTRIL) 20 MG tablet Take 1 tablet (20 mg total) by mouth daily. 90 tablet 3  . metFORMIN (GLUCOPHAGE-XR) 750 MG 24 hr tablet Take 1 tablet (750 mg total) by mouth 2 (two) times daily. 60 tablet 11  . methylphenidate (RITALIN) 20 MG tablet Take 1 tablet (20 mg total) by mouth 3 (three) times daily with meals. 180 tablet 0  . simvastatin (ZOCOR) 80 MG tablet TAKE 1 TABLET (80 MG TOTAL) BY MOUTH AT BEDTIME. 90 tablet 3  . zoster vaccine live, PF, (ZOSTAVAX) 81191 UNT/0.65ML injection Inject 19,400 Units into the skin once. 1 vial 0   No current facility-administered medications on file prior to visit.     Review of Systems Review of Systems    Constitutional: Negative for fever, appetite change, fatigue and unexpected weight change.  Eyes: Negative for pain and visual disturbance.  Respiratory: Negative for cough and shortness of breath.   Cardiovascular: Negative for cp or palpitations    Gastrointestinal: Negative for nausea, diarrhea and constipation.  Genitourinary: Negative for urgency  and frequency.  Skin: Negative for pallor or rash   Neurological: Negative for weakness, light-headedness, numbness and headaches.  Hematological: Negative for adenopathy. Does not bruise/bleed easily.  Psychiatric/Behavioral: Negative for dysphoric mood. The patient is not nervous/anxious.  pos for financial stressors        Objective:   Physical Exam  Constitutional: She appears well-developed and well-nourished. No distress.  overwt and well appearing   HENT:  Head: Normocephalic and atraumatic.  Right Ear: External ear normal.  Left Ear: External ear normal.  Mouth/Throat: Oropharynx is clear and moist.  Eyes: Conjunctivae and EOM are normal. Pupils are equal, round, and reactive to light. No scleral icterus.  Neck: Normal range of motion. Neck supple. No JVD present. Carotid bruit is not present. No thyromegaly present.  Cardiovascular: Normal rate, regular rhythm, normal heart sounds and intact distal pulses.  Exam reveals no gallop.   Pulmonary/Chest: Effort normal and breath sounds normal. No respiratory distress. She has no wheezes. She exhibits no tenderness.  Abdominal: Soft. Bowel sounds are normal. She exhibits no distension, no abdominal bruit and no mass. There is no tenderness.  Genitourinary: No breast swelling, tenderness, discharge or bleeding.  Musculoskeletal: Normal range of motion. She exhibits no edema or tenderness.  Lymphadenopathy:    She has no cervical adenopathy.  Neurological: She is alert. She has normal reflexes. No cranial nerve deficit. She exhibits normal muscle tone. Coordination normal.  Skin: Skin  is warm and dry. No rash noted. No erythema. No pallor.  Psychiatric: She has a normal mood and affect.          Assessment & Plan:   Problem List Items Addressed This Visit      Cardiovascular and Mediastinum   Essential hypertension    bp in fair control at this time (up slightly- just took medicine) BP Readings from Last 1 Encounters:  02/15/16 (!) 142/62   No changes needed Disc lifstyle change with low sodium diet and exercise  Will re check next time -is up a little         Endocrine   Hypothyroidism    Lab Results  Component Value Date   TSH 4.82 (H) 02/05/2016   Up a bit with 2 recent missed doses Will get back on track with compliance  F/u 3 mo with re check      Relevant Orders   TSH   Diabetes type 2, uncontrolled ([place])    Lab Results  Component Value Date   HGBA1C 8.0 (H) 02/05/2016   Not well controlled Pt will call her ins to see what other oral agents are covered and let us know       Relevant Orders   Hemoglobin A1c     Other   Routine general medical examination at a health care facility    Reviewed health habits including diet and exercise and skin cancer prevention Reviewed appropriate screening tests for age  Also reviewed health mt list, fam hx and immunization status , as well as social and family history   Will re schedule AMW Lab rev See HPI You are due for an eye exam - call your insurance and tell them is is a diabetic eye exam - and see if they cover it better  Find out what brand of meter/strips and lancets (specific name of all) are covered - and let me know I can send it to the pharmacy  Please get me a list of diabetes medicines that are covered by your  insurance (with a copay you can afford)- so I can pick something  Try to stick to a diabetic diet - give away sweets and eat more green vegetables and lean protien  Also start walking as the weather cools down  Don't for get to schedule your mammogram  We will sign you up  for the cologuard program for colon cancer screening   Follow up in 3 months with labs prior       Hyperlipidemia LDL goal <100    Triglycerides are up (poss due to glucose) Disc goals for lipids and reasons to control them Rev labs with pt Rev low sat fat diet in detail Will work on diet and sugar control Re check 3 mo and then f/u  Continue simvastatin       Relevant Orders   Lipid panel   Colon cancer screening    cologuard -will enroll in the program Has declined colonoscopies in the past        Other Visit Diagnoses    Need for influenza vaccination    -  Primary   Relevant Orders   Flu Vaccine QUAD 36+ mos IM (Completed)

## 2016-02-15 NOTE — Progress Notes (Signed)
Pre visit review using our clinic review tool, if applicable. No additional management support is needed unless otherwise documented below in the visit note. 

## 2016-02-15 NOTE — Patient Instructions (Addendum)
You are due for an eye exam - call your insurance and tell them is is a diabetic eye exam - and see if they cover it better  Find out what brand of meter/strips and lancets (specific name of all) are covered - and let me know I can send it to the pharmacy  Please get me a list of diabetes medicines that are covered by your insurance (with a copay you can afford)- so I can pick something  Try to stick to a diabetic diet - give away sweets and eat more green vegetables and lean protien  Also start walking as the weather cools down  Don't for get to schedule your mammogram  We will sign you up for the cologuard program for colon cancer screening   Follow up in 3 months with labs prior

## 2016-02-15 NOTE — Assessment & Plan Note (Signed)
Lab Results  Component Value Date   HGBA1C 8.0 (H) 02/05/2016   Not well controlled Pt will call her ins to see what other oral agents are covered and let us know

## 2016-02-15 NOTE — Assessment & Plan Note (Signed)
Reviewed health habits including diet and exercise and skin cancer prevention Reviewed appropriate screening tests for age  Also reviewed health mt list, fam hx and immunization status , as well as social and family history   Will re schedule [facility] Lab rev See HPI You are due for an eye exam - call your insurance and tell them is is a diabetic eye exam - and see if they cover it better  Find out what brand of meter/strips and lancets (specific name of all) are covered - and let me know I can send it to the pharmacy  Please get me a list of diabetes medicines that are covered by your insurance (with a copay you can afford)- so I can pick something  Try to stick to a diabetic diet - give away sweets and eat more green vegetables and lean protien  Also start walking as the weather cools down  Don't for get to schedule your mammogram  We will sign you up for the cologuard program for colon cancer screening   Follow up in 3 months with labs prior

## 2016-02-19 ENCOUNTER — Ambulatory Visit (INDEPENDENT_AMBULATORY_CARE_PROVIDER_SITE_OTHER): Payer: Medicare Other

## 2016-02-19 VITALS — BP 138/70 | HR 93 | Temp 98.5°F | Ht 61.0 in | Wt 157.8 lb

## 2016-02-19 DIAGNOSIS — Z0000 Encounter for general adult medical examination without abnormal findings: Secondary | ICD-10-CM

## 2016-02-19 DIAGNOSIS — Z7189 Other specified counseling: Secondary | ICD-10-CM | POA: Diagnosis not present

## 2016-02-19 DIAGNOSIS — Z0189 Encounter for other specified special examinations: Secondary | ICD-10-CM

## 2016-02-19 NOTE — Progress Notes (Signed)
Subjective:   Amanda King is a 75 y.o. female who presents for Medicare Annual (Subsequent) preventive examination.  Review of Systems:  N/A Cardiac Risk Factors include: advanced age (>80mn, >>5women);diabetes mellitus;dyslipidemia;hypertension     Objective:     Vitals: BP 138/70 (BP Location: Left Arm, Patient Position: Sitting, Cuff Size: Normal)   Pulse 93   Temp 98.5 F (36.9 C) (Oral)   Ht _0  (1.549 m)   Wt 157 lb 12 oz (71.6 kg)   SpO2 98%   BMI 29.81 kg/m   Body mass index is 29.81 kg/m.   Tobacco History  Smoking Status  . Former Smoker  . Quit date: 06/09/1980  Smokeless Tobacco  . Never Used     Counseling given: No   Past Medical History:  Diagnosis Date  . Diabetes mellitus    Type II  . Hyperlipidemia   . Hypertension   . Hypothyroidism   . Migraine   . Narcolepsy with cataplexy   . Pneumonia 08/2001   Past Surgical History:  Procedure Laterality Date  . APPENDECTOMY  age 75 . hemilaminectomy     C5-6; C6-7  . PARTIAL HYSTERECTOMY  age 75 . POSTERIOR CERVICAL FUSION/FORAMINOTOMY  02/05/1975   C5-C6-C7   Family History  Problem Relation Age of Onset  . Depression Brother     suicide  . Diabetes Brother   . Heart disease Mother     MI  . Peripheral vascular disease Mother   . Hyperlipidemia Mother   . Diabetes Brother   . Sleep apnea Son   . Sleep apnea Brother    History  Sexual Activity  . Sexual activity: No    Outpatient Encounter Prescriptions as of 02/19/2016  Medication Sig  . aspirin 81 MG tablet Take 81 mg by mouth daily.    . Blood Glucose Monitoring Suppl (BLOOD GLUCOSE MONITOR KIT) KIT Check blood sugar once daily as needed for DM   . cetirizine (ZYRTEC) 10 MG tablet Take 10 mg by mouth daily.  .Marland KitchenglipiZIDE (GLUCOTROL XL) 10 MG 24 hr tablet Take 1 tablet (10 mg total) by mouth daily with breakfast.  . glucose blood test strip Check blood sugar once daily as needed for DM.   . hydrochlorothiazide  (HYDRODIURIL) 25 MG tablet TAKE ONE TABLET BY MOUTH ONCE DAILY  . ibuprofen (ADVIL,MOTRIN) 200 MG tablet Take 200 mg by mouth every 6 (six) hours as needed.    .Marland Kitchenimipramine (TOFRANIL) 50 MG tablet Take 1 tablet (50 mg total) by mouth at bedtime.  .Marland Kitchenlevothyroxine (SYNTHROID, LEVOTHROID) 75 MCG tablet TAKE 1 TABLET (75 MCG TOTAL) BY MOUTH DAILY.  .Marland Kitchenlisinopril (PRINIVIL,ZESTRIL) 20 MG tablet Take 1 tablet (20 mg total) by mouth daily.  . metFORMIN (GLUCOPHAGE-XR) 750 MG 24 hr tablet Take 1 tablet (750 mg total) by mouth 2 (two) times daily.  . methylphenidate (RITALIN) 20 MG tablet Take 1 tablet (20 mg total) by mouth 3 (three) times daily with meals.  . simvastatin (ZOCOR) 80 MG tablet TAKE 1 TABLET (80 MG TOTAL) BY MOUTH AT BEDTIME.  .Marland Kitchenzoster vaccine live, PF, (ZOSTAVAX) 149702UNT/0.65ML injection Inject 19,400 Units into the skin once.   No facility-administered encounter medications on file as of 02/19/2016.     Activities of Daily Living In your present state of health, do you have any difficulty performing the following activities: 02/19/2016  Hearing? Y  Vision? N  Difficulty concentrating or making decisions? N  Walking or  climbing stairs? N  Dressing or bathing? N  Doing errands, shopping? N  Preparing Food and eating ? N  Using the Toilet? N  In the past six months, have you accidently leaked urine? N  Do you have problems with loss of bowel control? N  Managing your Medications? N  Managing your Finances? N  Housekeeping or managing your Housekeeping? N  Some recent data might be hidden    Patient Care Team: Abner Greenspan, MD as PCP - General Larey Seat, MD as Consulting Physician (Neurology) Arlie Solomons, MD as Consulting Physician (Audiology)    Assessment:     Visual Acuity Screening   Right eye Left eye Both eyes  Without correction:     With correction: 20/50 20/200 20/70  Hearing Screening Comments: Wears bilateral hearing aids   Exercise Activities  and Dietary recommendations Current Exercise Habits: Home exercise routine, Type of exercise: walking, Time (Minutes): 30, Frequency (Times/Week): 3, Weekly Exercise (Minutes/Week): 90, Intensity: Mild, Exercise limited by: None identified  Goals    . Increase physical activity          Starting 02/19/2016, I will continue to exercise for at least 30 min 3 days per week.       Fall Risk Fall Risk  02/19/2016 02/15/2016 09/21/2013  Falls in the past year? No No No   Depression Screen PHQ 2/9 Scores 02/19/2016 02/15/2016 09/21/2013  PHQ - 2 Score 0 0 0     Cognitive Testing MMSE - Mini Mental State Exam 02/19/2016  Orientation to time 5  Orientation to Place 5  Registration 3  Attention/ Calculation 0  Recall 3  Language- name 2 objects 0  Language- repeat 1  Language- follow 3 step command 3  Language- read & follow direction 0  Write a sentence 0  Copy design 0  Total score 20   PLEASE NOTE: A Mini-Cog screen was completed. Maximum score is 20. A value of 0 denotes this part of Folstein MMSE was not completed or the patient failed this part of the Mini-Cog screening.   Mini-Cog Screening Orientation to Time - Max 5 pts Orientation to Place - Max 5 pts Registration - Max 3 pts Recall - Max 3 pts Language Repeat - Max 1 pts Language Follow 3 Step Command - Max 3 pts   Immunization History  Administered Date(s) Administered  . Influenza Split 03/10/2011  . Influenza Whole 06/09/2002, 04/09/2007, 03/02/2008, 03/23/2009, 04/09/2010  . Influenza,inj,Quad PF,36+ Mos 04/30/2015, 02/15/2016  . Influenza-Unspecified 03/07/2014  . Pneumococcal Conjugate-13 08/03/2015  . Pneumococcal Polysaccharide-23 06/09/2002, 03/02/2008  . Td 04/25/1997  . Tdap 07/01/2011   Screening Tests Health Maintenance  Topic Date Due  . OPHTHALMOLOGY EXAM  06/08/2016 (Originally 06/22/2015)  . ZOSTAVAX  07/11/2020 (Originally 11/01/1999)  . HEMOGLOBIN A1C  08/06/2016  . FOOT EXAM  02/14/2017  .  TETANUS/TDAP  06/30/2021  . INFLUENZA VACCINE  Addressed  . DEXA SCAN  Completed  . PNA vac Low Risk Adult  Completed      Plan:     I have personally reviewed and addressed the Medicare Annual Wellness questionnaire and have noted the following in the patient's chart:  A. Medical and social history B. Use of alcohol, tobacco or illicit drugs  C. Current medications and supplements D. Functional ability and status E.  Nutritional status F.  Physical activity G. Advance directives H. List of other physicians I.  Hospitalizations, surgeries, and ER visits in previous 12 months J.  Vitals K.  Screenings to include hearing, vision, cognitive, depression L. Referrals and appointments - none  In addition, I have reviewed and discussed with patient certain preventive protocols, quality metrics, and best practice recommendations. A written personalized care plan for preventive services as well as general preventive health recommendations were provided to patient.  See attached scanned questionnaire for additional information.   Signed,   Lindell Noe, MHA, BS, LPN Health Advisor

## 2016-02-19 NOTE — Progress Notes (Signed)
PCP notes:   Health maintenance:  Eye exam - pt will schedule future appt  Abnormal screenings:   None  Patient concerns:   Pt verbalized difficulty with completing advanced directives packet. Spent initial 15 min of AWV discussing advanced directives and addressing pt's concerns.   Nurse concerns:  None  Next PCP appt:   05/16/2016 @ 0900  I reviewed health advisor's note, was available for consultation, and agree with documentation and plan.  Roxy MannsMarne Tower MD

## 2016-02-19 NOTE — Patient Instructions (Signed)
Ms. Annie Parasngold , Thank you for taking time to come for your Medicare Wellness Visit. I appreciate your ongoing commitment to your health goals. Please review the following plan we discussed and let me know if I can assist you in the future.   These are the goals we discussed: Goals    . Increase physical activity          Starting 02/19/2016, I will continue to exercise for at least 30 min 3 days per week.        This is a list of the screening recommended for you and due dates:  Health Maintenance  Topic Date Due  . Eye exam for diabetics  06/08/2016*  . Shingles Vaccine  07/11/2020*  . Hemoglobin A1C  08/06/2016  . Complete foot exam   02/14/2017  . Tetanus Vaccine  06/30/2021  . Flu Shot  Addressed  . DEXA scan (bone density measurement)  Completed  . Pneumonia vaccines  Completed  *Topic was postponed. The date shown is not the original due date.   Preventive Care for Adults  A healthy lifestyle and preventive care can promote health and wellness. Preventive health guidelines for adults include the following key practices.  . A routine yearly physical is a good way to check with your health care provider about your health and preventive screening. It is a chance to share any concerns and updates on your health and to receive a thorough exam.  . Visit your dentist for a routine exam and preventive care every 6 months. Brush your teeth twice a day and floss once a day. Good oral hygiene prevents tooth decay and gum disease.  . The frequency of eye exams is based on your age, health, family medical history, use  of contact lenses, and other factors. Follow your health care provider's ecommendations for frequency of eye exams.  . Eat a healthy diet. Foods like vegetables, fruits, whole grains, low-fat dairy products, and lean protein foods contain the nutrients you need without too many calories. Decrease your intake of foods high in solid fats, added sugars, and salt. Eat the right  amount of calories for you. Get information about a proper diet from your health care provider, if necessary.  . Regular physical exercise is one of the most important things you can do for your health. Most adults should get at least 150 minutes of moderate-intensity exercise (any activity that increases your heart rate and causes you to sweat) each week. In addition, most adults need muscle-strengthening exercises on 2 or more days a week.  Silver Sneakers may be a benefit available to you. To determine eligibility, you may visit the website: www.silversneakers.com or contact program at [phone] Mon-Fri between 8AM-8PM.   . Maintain a healthy weight. The body mass index (BMI) is a screening tool to identify possible weight problems. It provides an estimate of body fat based on height and weight. Your health care provider can find your BMI and can help you achieve or maintain a healthy weight.   For adults 20 years and older: ? A BMI below 18.5 is considered underweight. ? A BMI of 18.5 to 24.9 is normal. ? A BMI of 25 to 29.9 is considered overweight. ? A BMI of 30 and above is considered obese.   . Maintain normal blood lipids and cholesterol levels by exercising and minimizing your intake of saturated fat. Eat a balanced diet with plenty of fruit and vegetables. Blood tests for lipids and cholesterol should begin at  age 17 and be repeated every 5 years. If your lipid or cholesterol levels are high, you are over 50, or you are at high risk for heart disease, you may need your cholesterol levels checked more frequently. Ongoing high lipid and cholesterol levels should be treated with medicines if diet and exercise are not working.  . If you smoke, find out from your health care provider how to quit. If you do not use tobacco, please do not start.  . If you choose to drink alcohol, please do not consume more than 2 drinks per day. One drink is considered to be 12 ounces (355 mL) of beer, 5  ounces (148 mL) of wine, or 1.5 ounces (44 mL) of liquor.  . If you are 52-13 years old, ask your health care provider if you should take aspirin to prevent strokes.  . Use sunscreen. Apply sunscreen liberally and repeatedly throughout the day. You should seek shade when your shadow is shorter than you. Protect yourself by wearing long sleeves, pants, a wide-brimmed hat, and sunglasses year round, whenever you are outdoors.  . Once a month, do a whole body skin exam, using a mirror to look at the skin on your back. Tell your health care provider of new moles, moles that have irregular borders, moles that are larger than a pencil eraser, or moles that have changed in shape or color.

## 2016-02-19 NOTE — Progress Notes (Signed)
Pre visit review using our clinic review tool, if applicable. No additional management support is needed unless otherwise documented below in the visit note. 

## 2016-04-04 ENCOUNTER — Telehealth: Payer: Self-pay | Admitting: Neurology

## 2016-04-04 DIAGNOSIS — G47411 Narcolepsy with cataplexy: Secondary | ICD-10-CM

## 2016-04-04 DIAGNOSIS — G4710 Hypersomnia, unspecified: Secondary | ICD-10-CM

## 2016-04-04 MED ORDER — METHYLPHENIDATE HCL 20 MG PO TABS: 20.0000 mg | ORAL_TABLET | Freq: Three times a day (TID) | ORAL | 0 refills | Status: DC

## 2016-04-04 NOTE — Telephone Encounter (Signed)
90 day supply

## 2016-04-04 NOTE — Telephone Encounter (Signed)
Refilled

## 2016-04-04 NOTE — Telephone Encounter (Signed)
Pt request refill for methylphenidate (RITALIN) 20 MG tablet °

## 2016-04-07 NOTE — Telephone Encounter (Signed)
I called pt to advise her that her RX is ready for pick up at the front desk. No answer, left a message asking pt to call me back. If pt calls back, please advise her of this information.

## 2016-04-25 ENCOUNTER — Telehealth: Payer: Self-pay

## 2016-04-25 NOTE — Telephone Encounter (Signed)
Anabel Halonana Taylor with [employer] left v/m requesting Dr Royden Purlower's office contact pt with possible names of meds that will work well with metformin. Then pt can call ins co to see if the named meds are approved. Request cb to pt.

## 2016-04-25 NOTE — Telephone Encounter (Signed)
I like Januvia (oral) and lantus basal insulin  Let me know

## 2016-04-28 NOTE — Telephone Encounter (Signed)
Pt given the name of the 2 meds and she will check with her insurance and let us know

## 2016-05-05 MED ORDER — SITAGLIPTIN PHOSPHATE 50 MG PO TABS: 50.0000 mg | ORAL_TABLET | Freq: Every day | ORAL | 11 refills | Status: DC

## 2016-05-05 NOTE — Telephone Encounter (Signed)
Per DPR left voicemail letting pt know Rx sent to pharmacy and advise of Dr. Royden Purlower's comments/ instructions

## 2016-05-05 NOTE — Telephone Encounter (Signed)
Pt left vm requesting 30 day rx for Januvia (Januvia is covered.)[facility] [place] [facility].

## 2016-05-05 NOTE — Addendum Note (Signed)
Addended by: Roxy MannsWER, MARNE A on: 05/05/2016 04:50 PM   Modules accepted: Orders

## 2016-05-05 NOTE — Telephone Encounter (Signed)
I sent it  We are starting at the 1/2 usual starting dose to make sure it does not lower glucose too much  Alert me if this happens or if any side effects at all  I will see her at her next appt

## 2016-05-13 ENCOUNTER — Other Ambulatory Visit (INDEPENDENT_AMBULATORY_CARE_PROVIDER_SITE_OTHER): Payer: Medicare Other

## 2016-05-13 DIAGNOSIS — E785 Hyperlipidemia, unspecified: Secondary | ICD-10-CM | POA: Diagnosis not present

## 2016-05-13 DIAGNOSIS — IMO0001 Reserved for inherently not codable concepts without codable children: Secondary | ICD-10-CM

## 2016-05-13 DIAGNOSIS — E1165 Type 2 diabetes mellitus with hyperglycemia: Secondary | ICD-10-CM

## 2016-05-13 DIAGNOSIS — E039 Hypothyroidism, unspecified: Secondary | ICD-10-CM

## 2016-05-13 LAB — LIPID PANEL
CHOL/HDL RATIO: 5
Cholesterol: 186 mg/dL (ref 0–200)
HDL: 41.1 mg/dL (ref >39.00)
NONHDL: 145.26
TRIGLYCERIDES: 310 mg/dL — AB (ref 0.0–149.0)
VLDL: 62 mg/dL — ABNORMAL HIGH (ref 0.0–40.0)

## 2016-05-13 LAB — HEMOGLOBIN A1C: Hgb A1c MFr Bld: 8.1 % — ABNORMAL HIGH (ref 4.6–6.5)

## 2016-05-13 LAB — TSH: TSH: 6.05 u[IU]/mL — ABNORMAL HIGH (ref 0.35–4.50)

## 2016-05-13 LAB — LDL CHOLESTEROL, DIRECT: Direct LDL: 87 mg/dL

## 2016-05-16 ENCOUNTER — Encounter: Payer: Self-pay | Admitting: Family Medicine

## 2016-05-16 ENCOUNTER — Ambulatory Visit (INDEPENDENT_AMBULATORY_CARE_PROVIDER_SITE_OTHER): Payer: Medicare Other | Admitting: Family Medicine

## 2016-05-16 VITALS — BP 132/70 | HR 73 | Temp 97.5°F | Ht 61.25 in | Wt 161.2 lb

## 2016-05-16 DIAGNOSIS — E039 Hypothyroidism, unspecified: Secondary | ICD-10-CM | POA: Diagnosis not present

## 2016-05-16 DIAGNOSIS — Z6830 Body mass index (BMI) 30.0-30.9, adult: Secondary | ICD-10-CM

## 2016-05-16 DIAGNOSIS — E6609 Other obesity due to excess calories: Secondary | ICD-10-CM

## 2016-05-16 DIAGNOSIS — E1165 Type 2 diabetes mellitus with hyperglycemia: Secondary | ICD-10-CM

## 2016-05-16 DIAGNOSIS — I10 Essential (primary) hypertension: Secondary | ICD-10-CM | POA: Diagnosis not present

## 2016-05-16 DIAGNOSIS — E785 Hyperlipidemia, unspecified: Secondary | ICD-10-CM

## 2016-05-16 DIAGNOSIS — IMO0001 Reserved for inherently not codable concepts without codable children: Secondary | ICD-10-CM

## 2016-05-16 MED ORDER — LEVOTHYROXINE SODIUM 88 MCG PO TABS: 88.0000 ug | ORAL_TABLET | Freq: Every day | ORAL | 11 refills | Status: DC

## 2016-05-16 NOTE — Progress Notes (Signed)
Subjective:    Patient ID: Amanda King, female    DOB: Jan 27, 1941, 75 y.o.   MRN: [id number]  HPI Here for f/u of chronic problems  Had to put her dog to sleep - got sick unexpectedly with diabetes  She is down in the dumps    Wt Readings from Last 3 Encounters:  05/16/16 161 lb 4 oz (73.1 kg)  02/19/16 157 lb 12 oz (71.6 kg)  02/15/16 158 lb (71.7 kg)   Bmi 30.22  bp is stable today  No cp or palpitations or headaches or edema  No side effects to medicines  BP Readings from Last 3 Encounters:  05/16/16 132/70  02/19/16 138/70  02/15/16 (!) 142/62      Diabetes Home sugar results - getting better with Januvia , her glucoses are down to 150s and 160s  On it since 11/27 DM diet - she is doing better / eating better just recently  Exercise -- started walking  Symptoms-none  A1C last  Lab Results  Component Value Date   HGBA1C 8.1 (H) 05/13/2016  up from 8.0  No problems with medications -metformin and januvia and glipizide  Renal protection- ace  Last eye exam  Could not afford lately - plans to do it at [facility] this time and she can get it covered   Hypothyroidism  Pt has no clinical changes No change in energy level/ hair or skin/ edema and no tremor Lab Results  Component Value Date   TSH 6.05 (H) 05/13/2016    On 75 mcg and no missed doses   Hyperlipidemia On zocor and diet Lab Results  Component Value Date   CHOL 186 05/13/2016   HDL 41.10 05/13/2016   LDLCALC 73 06/21/2014   LDLDIRECT 87.0 05/13/2016   TRIG 310.0 (H) 05/13/2016   CHOLHDL 5 05/13/2016   Pt is watching out for sat and trans fats in diet   Patient Active Problem List   Diagnosis Date Noted  . Routine general medical examination at a health care facility 01/31/2016  . Narcolepsy with cataplexy 10/31/2014  . Stress reaction 03/28/2014  . Encounter for Medicare annual wellness exam 09/22/2013  . Hypersomnia, persistent 07/15/2013  . Foot abrasion 04/09/2012  . Other screening  mammogram 12/31/2011  . Colon cancer screening 12/31/2011  . Obesity 07/01/2011  . ANXIETY 05/06/2010  . DERMATOPHYTOSIS OF NAIL 12/21/2008  . Hypothyroidism 09/02/2006  . Diabetes type 2, uncontrolled ([place]) 09/02/2006  . Hyperlipidemia LDL goal <100 09/02/2006  . NARCOLEPSY W/CATAPLEXY 09/02/2006  . Essential hypertension 09/02/2006  . PNEUMONIA, HX OF 09/02/2006   Past Medical History:  Diagnosis Date  . Diabetes mellitus    Type II  . Hyperlipidemia   . Hypertension   . Hypothyroidism   . Migraine   . Narcolepsy with cataplexy   . Pneumonia 08/2001   Past Surgical History:  Procedure Laterality Date  . APPENDECTOMY  age 43  . hemilaminectomy     C5-6; C6-7  . PARTIAL HYSTERECTOMY  age 15  . POSTERIOR CERVICAL FUSION/FORAMINOTOMY  02/05/1975   C5-C6-C7   Social History  Substance Use Topics  . Smoking status: Former Smoker    Quit date: 06/09/1980  . Smokeless tobacco: Never Used  . Alcohol use No   Family History  Problem Relation Age of Onset  . Depression Brother     suicide  . Diabetes Brother   . Heart disease Mother     MI  . Peripheral vascular disease Mother   .  Hyperlipidemia Mother   . Diabetes Brother   . Sleep apnea Son   . Sleep apnea Brother    Allergies  Allergen Reactions  . Penicillins     REACTION: GI upset   Current Outpatient Prescriptions on File Prior to Visit  Medication Sig Dispense Refill  . aspirin 81 MG tablet Take 81 mg by mouth daily.      . Blood Glucose Monitoring Suppl (BLOOD GLUCOSE MONITOR KIT) KIT Check blood sugar once daily as needed for DM     . cetirizine (ZYRTEC) 10 MG tablet Take 10 mg by mouth daily.    Marland Kitchen glipiZIDE (GLUCOTROL XL) 10 MG 24 hr tablet Take 1 tablet (10 mg total) by mouth daily with breakfast. 90 tablet 3  . glucose blood test strip Check blood sugar once daily as needed for DM.     . hydrochlorothiazide (HYDRODIURIL) 25 MG tablet TAKE ONE TABLET BY MOUTH ONCE DAILY 30 tablet 5  . ibuprofen  (ADVIL,MOTRIN) 200 MG tablet Take 200 mg by mouth every 6 (six) hours as needed.      Marland Kitchen imipramine (TOFRANIL) 50 MG tablet Take 1 tablet (50 mg total) by mouth at bedtime. 90 tablet 1  . lisinopril (PRINIVIL,ZESTRIL) 20 MG tablet Take 1 tablet (20 mg total) by mouth daily. 90 tablet 3  . metFORMIN (GLUCOPHAGE-XR) 750 MG 24 hr tablet Take 1 tablet (750 mg total) by mouth 2 (two) times daily. 60 tablet 11  . methylphenidate (RITALIN) 20 MG tablet Take 1 tablet (20 mg total) by mouth 3 (three) times daily with meals. 180 tablet 0  . simvastatin (ZOCOR) 80 MG tablet TAKE 1 TABLET (80 MG TOTAL) BY MOUTH AT BEDTIME. 90 tablet 3  . sitaGLIPtin (JANUVIA) 50 MG tablet Take 1 tablet (50 mg total) by mouth daily. 30 tablet 11  . zoster vaccine live, PF, (ZOSTAVAX) 38182 UNT/0.65ML injection Inject 19,400 Units into the skin once. 1 vial 0   No current facility-administered medications on file prior to visit.      Review of Systems    Review of Systems  Constitutional: Negative for fever, appetite change, fatigue and unexpected weight change.  Eyes: Negative for pain and visual disturbance.  Respiratory: Negative for cough and shortness of breath.   Cardiovascular: Negative for cp or palpitations    Gastrointestinal: Negative for nausea, diarrhea and constipation.  Genitourinary: Negative for urgency and frequency.  Skin: Negative for pallor or rash   Neurological: Negative for weakness, light-headedness, numbness and headaches.  Hematological: Negative for adenopathy. Does not bruise/bleed easily.  Psychiatric/Behavioral: Negative for dysphoric mood. The patient is not nervous/anxious.  pos for grief     Objective:   Physical Exam  Constitutional: She appears well-developed and well-nourished. No distress.  obese and well appearing   HENT:  Head: Normocephalic and atraumatic.  Mouth/Throat: Oropharynx is clear and moist.  Eyes: Conjunctivae and EOM are normal. Pupils are equal, round, and  reactive to light.  Neck: Normal range of motion. Neck supple. No JVD present. Carotid bruit is not present. No thyromegaly present.  Cardiovascular: Normal rate, regular rhythm, normal heart sounds and intact distal pulses.  Exam reveals no gallop.   Pulmonary/Chest: Effort normal and breath sounds normal. No respiratory distress. She has no wheezes. She has no rales.  No crackles  Abdominal: Soft. Bowel sounds are normal. She exhibits no distension, no abdominal bruit and no mass. There is no tenderness.  Musculoskeletal: She exhibits no edema or tenderness.  Lymphadenopathy:  She has no cervical adenopathy.  Neurological: She is alert. She has normal reflexes. No cranial nerve deficit. She exhibits normal muscle tone. Coordination normal.  Skin: Skin is warm and dry. No rash noted. No pallor.  Psychiatric: She has a normal mood and affect.          Assessment & Plan:   Problem List Items Addressed This Visit      Cardiovascular and Mediastinum   Essential hypertension - Primary    bp in fair control at this time  BP Readings from Last 1 Encounters:  05/16/16 132/70   No changes needed Disc lifstyle change with low sodium diet and exercise   Labs reviewed         Endocrine   Hypothyroidism    Lab Results  Component Value Date   TSH 6.05 (H) 05/13/2016   Inc levothyrox to 88 mcg daily  Re check tsh in 3 mo before return      Relevant Medications   levothyroxine (SYNTHROID, LEVOTHROID) 88 MCG tablet   Diabetes type 2, uncontrolled ([place])    Lab Results  Component Value Date   HGBA1C 8.1 (H) 05/13/2016   Just started Januvia however and glucose readings are improved Also plan for diet and exercise She has coverage for her DM eye exam at [facility] also  F/u 3 mo  If needed can inc januvia to 100  Wt loss enc         Other   Obesity    Discussed how this problem influences overall health and the risks it imposes  Reviewed plan for weight loss with lower  calorie diet (via better food choices and also portion control or program like weight watchers) and exercise building up to or more than 30 minutes 5 days per week including some aerobic activity         Hyperlipidemia LDL goal <100    Disc goals for lipids and reasons to control them Rev labs with pt Rev low sat fat diet in detail Trig high (due to DM) but otherwise good control

## 2016-05-16 NOTE — Progress Notes (Signed)
Pre visit review using our clinic review tool, if applicable. No additional management support is needed unless otherwise documented below in the visit note. 

## 2016-05-16 NOTE — Assessment & Plan Note (Signed)
Discussed how this problem influences overall health and the risks it imposes  Reviewed plan for weight loss with lower calorie diet (via better food choices and also portion control or program like weight watchers) and exercise building up to or more than 30 minutes 5 days per week including some aerobic activity    

## 2016-05-16 NOTE — Assessment & Plan Note (Signed)
bp in fair control at this time  BP Readings from Last 1 Encounters:  05/16/16 132/70   No changes needed Disc lifstyle change with low sodium diet and exercise   Labs reviewed

## 2016-05-16 NOTE — Assessment & Plan Note (Signed)
Lab Results  Component Value Date   TSH 6.05 (H) 05/13/2016   Inc levothyrox to 88 mcg daily  Re check tsh in 3 mo before return

## 2016-05-16 NOTE — Assessment & Plan Note (Signed)
Lab Results  Component Value Date   HGBA1C 8.1 (H) 05/13/2016   Just started Januvia however and glucose readings are improved Also plan for diet and exercise She has coverage for her DM eye exam at [facility] also  F/u 3 mo  If needed can inc januvia to 100  Wt loss enc

## 2016-05-16 NOTE — Assessment & Plan Note (Signed)
Disc goals for lipids and reasons to control them Rev labs with pt Rev low sat fat diet in detail Trig high (due to DM) but otherwise good control

## 2016-05-16 NOTE — Patient Instructions (Addendum)
Go get your eye exam at [facility]  Continue current medicines  I expect the Januvia to work over time and we will have a lower A1C next time Keep working on diet and exercise  Increase levothyroxine to 88 mcg daily  Alert us if any side effects or problems

## 2016-06-22 ENCOUNTER — Other Ambulatory Visit: Payer: Self-pay | Admitting: Family Medicine

## 2016-06-30 ENCOUNTER — Ambulatory Visit (INDEPENDENT_AMBULATORY_CARE_PROVIDER_SITE_OTHER): Payer: Medicare Other | Admitting: Neurology

## 2016-06-30 ENCOUNTER — Encounter: Payer: Self-pay | Admitting: Neurology

## 2016-06-30 DIAGNOSIS — G4710 Hypersomnia, unspecified: Secondary | ICD-10-CM | POA: Diagnosis not present

## 2016-06-30 DIAGNOSIS — G47411 Narcolepsy with cataplexy: Secondary | ICD-10-CM

## 2016-06-30 MED ORDER — IMIPRAMINE HCL 50 MG PO TABS: 50.0000 mg | ORAL_TABLET | Freq: Every day | ORAL | 1 refills | Status: DC

## 2016-06-30 MED ORDER — METHYLPHENIDATE HCL 20 MG PO TABS: 20.0000 mg | ORAL_TABLET | Freq: Three times a day (TID) | ORAL | 0 refills | Status: DC

## 2016-06-30 NOTE — Progress Notes (Signed)
[facility]  Provider:  Larey Seat, M D  Referring Provider: Abner Greenspan, MD Primary Care Physician:  Loura Pardon, MD  Chief Complaint  Patient presents with  . Follow-up    going well, tofrainil working well    HPI:  Amanda King is a 76 y.o. female seen here as a revisit  from Dr. Glori Bickers for follow up of symptoms of Hypersomnia, RV from 10-31-14 .   I had last seen Amanda King in 2013 after she was referred to me with a diagnosis of narcolepsy with cataplexy.  She had been treated on generic imipramine and Ritalin;   50  mg of imipramine at night , and 20 mg of methylphenidate 4 times daily. In  2013 she described that in spite of the medication she had a cataplectic attack related to the emotionally upsetting news  of her granddaughter's father-in-law's death.  She reported rare sleep hallucinations and had been followed by Dr. Morrell Riddle , Dr. Earley Favor  and was Dr. Tressia Danas patient until his retirement.  The patient states that the narcolepsy was diagnosed  after 1989 with cataplectic attacks.;  she was unsure of the was any viral onset and that she has no family history of narcolepsy but at the time underwent a neck surgery with myelogram and somehow  Remained always sleepier since that time.  She reported ongoing cataplectic spells whenever she tried to change dosing of her medication - she had been using Tofranil as the brand name to sleep  but was due to costs and insurance coverage forced to change to generic imipramine, which triggered additional cataplectic attacks.  We have discussed Xyrem in our last visit, but postponed the initiation as she felt that her current regimen was sufficient. The patient initiates bedtime normally at 11 PM, she will fall asleep promptly, times a week Mondays, Wednesdays and Fridays she will rise at 7 AM to go to the gym. Spontaneously she will wake up between 7 and 8 on other days as well. During the day she often takes a nap on  her couch, about 45 minutes duration that she feels is refreshing. The nocturnal sleep time is estimated to be around 8 hours as well. He can be fragmented but visit dreams but he denied any nightmares or any "acting out "of dreams. She is very emotional at funerals and had multiple cataplectic spells in that setting.  The patient just retired from [employer]. after 24 years and is now rearranging her day-to-day life. 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 She isn't from a smoker but quit over 32 years ago and she has not used alcohol or recreational drugs. She would drink up to 3 cups of caffeinated beverages per day.   Rv 10-31-14; Amanda King is here today for which he revisit and refills. She reports doing fine, he had no paralyzing spells recently no nightmares, no dream intrusion. I asked her about cataplexy and she said it right now she seems to be free of so spells. There is that the only time she experiences cataplexy now is when she is really upsets or last time she was at a funeral and grieving. She endorsed today the Epworth sleepiness score at 12 points down from 13 and the fatigue severity score at 12 points.  Revisit from 12-20-14 Amanda King has been diagnosed with narcolepsy and cataplexy she has clinically all the necessary symptoms and an HLA test was positive for the HLA he he 1. We were finally unable in May to start her on Xyrem , and she is now on TOFRANIL which she has tolerated well but she takes 1 nocturnal dose.  She takes up to 4 times a day stimulant medication her case Ritalin. Sometimes she can get by this 2 or 3 doses only. It is remarkable that she has found new employment and the energy to work now 6 days a week. Her treatment made the difference.   Interval history from 12/24/2015, Amanda King reports today that  she has done very well with her current medical regimen and treatment of narcolepsy. She endorsed 7 points on the Epworth sleepiness score, 9 points on the fatigue severity score and 0 on the geriatric depression score. She has decided that she cannot retire and will work 3 times a week and this keeps her active and happy. She also just told me that her granddaughter's are 8 and 54 years old and that she has already  2 great-grandchildren 38  years old !  Interval history 22nd of January 2018, Amanda King has done very well on Ritalin and Tofranil the treatment of narcolepsy with cataplexy. She was HLA positive for narcolepsy,  she was unable to undergo an MS LT test. She lost her beloved dog just before Christmas 2017 and currently has a new puppy and that she is trying to train. She no longer visits the gym at this time but she has walked more. She loves to read in bed. She does not watch TV in bed.    Review of Systems: Out of a complete 14 system review, the patient complains of only the following symptoms, and all other reviewed systems are negative. The patient endorsed the fatigue severity scale at 11 , and the Epworth Sleepiness Scale at  7points , and the geriatric depression score at 2/15 point. She has significant loss of hearing and wears hearing aids bilaterally.  Her son installed some exercise equipment in her upstairs bedroom.   Caffeine- after work at night !  Tobacco- none, quit 37 years ago. 1979 ETOH , 1 beer or wine on a week end.    Social History   Social History  . Marital status: Widowed    Spouse name: N/A  . Number of children: 3  . Years of education: GED   Occupational History  .  [employer]   Social History Main Topics  . Smoking status: Former Smoker    Quit date: 06/09/1980  . Smokeless tobacco: Never Used  . Alcohol use No  . Drug use: No  . Sexual activity: No   Other Topics Concern  . Not on file   Social History Narrative   Patient is  widowed. and her son lives with her.   Walks for exercise.     Patient has three children.   Patient is right-handed.   Patient drinks three cups of caffeine daily.     Patient is retired.   Patient has a GED.    Family History  Problem Relation Age of Onset  . Depression Brother     suicide  . Diabetes Brother   . Heart disease Mother     MI  . Peripheral vascular disease Mother   . Hyperlipidemia Mother   . Diabetes Brother   . Sleep apnea Son   .  Sleep apnea Brother     Past Medical History:  Diagnosis Date  . Diabetes mellitus    Type II  . Hyperlipidemia   . Hypertension   . Hypothyroidism   . Migraine   . Narcolepsy with cataplexy   . Pneumonia 08/2001    Past Surgical History:  Procedure Laterality Date  . APPENDECTOMY  age 48  . hemilaminectomy     C5-6; C6-7  . PARTIAL HYSTERECTOMY  age 38  . POSTERIOR CERVICAL FUSION/FORAMINOTOMY  02/05/1975   C5-C6-C7    Current Outpatient Prescriptions  Medication Sig Dispense Refill  . aspirin 81 MG tablet Take 81 mg by mouth daily.      . Blood Glucose Monitoring Suppl (BLOOD GLUCOSE MONITOR KIT) KIT Check blood sugar once daily as needed for DM     . cetirizine (ZYRTEC) 10 MG tablet Take 10 mg by mouth daily.    Marland Kitchen glipiZIDE (GLUCOTROL XL) 10 MG 24 hr tablet Take 1 tablet (10 mg total) by mouth daily with breakfast. 90 tablet 3  . glucose blood test strip Check blood sugar once daily as needed for DM.     . hydrochlorothiazide (HYDRODIURIL) 25 MG tablet TAKE ONE TABLET BY MOUTH ONCE DAILY 90 tablet 0  . ibuprofen (ADVIL,MOTRIN) 200 MG tablet Take 200 mg by mouth every 6 (six) hours as needed.      Marland Kitchen imipramine (TOFRANIL) 50 MG tablet Take 1 tablet (50 mg total) by mouth at bedtime. 90 tablet 1  . levothyroxine (SYNTHROID, LEVOTHROID) 88 MCG tablet Take 1 tablet (88 mcg total) by mouth daily. 30 tablet 11  . lisinopril (PRINIVIL,ZESTRIL) 20 MG tablet Take 1 tablet (20 mg total) by mouth daily. 90 tablet 3  .  metFORMIN (GLUCOPHAGE-XR) 750 MG 24 hr tablet Take 1 tablet (750 mg total) by mouth 2 (two) times daily. 60 tablet 11  . methylphenidate (RITALIN) 20 MG tablet Take 1 tablet (20 mg total) by mouth 3 (three) times daily with meals. 180 tablet 0  . simvastatin (ZOCOR) 80 MG tablet TAKE 1 TABLET (80 MG TOTAL) BY MOUTH AT BEDTIME. 90 tablet 3  . sitaGLIPtin (JANUVIA) 50 MG tablet Take 1 tablet (50 mg total) by mouth daily. 30 tablet 11  . zoster vaccine live, PF, (ZOSTAVAX) 17530 UNT/0.65ML injection Inject 19,400 Units into the skin once. 1 vial 0   No current facility-administered medications for this visit.     Allergies as of 06/30/2016 - Review Complete 06/30/2016  Allergen Reaction Noted  . Penicillins  04/02/2007    Vitals: BP 129/69   Pulse 93   Resp 20   Ht '5\' 3"'  (1.6 m)   Wt 159 lb (72.1 kg)   BMI 28.17 kg/m  Last Weight:  Wt Readings from Last 1 Encounters:  06/30/16 159 lb (72.1 kg)   Last Height:   Ht Readings from Last 1 Encounters:  06/30/16 '5\' 3"'  (1.6 m)   Physical exam: stable weight for years,   General: The patient is awake, alert and appears not in acute distress. The patient is well groomed. Head: Normocephalic, atraumatic. Neck is supple. Mallampati 3 , neck circumference:15.25 . Cardiovascular:  Regular rate and rhythm, without  murmurs or carotid bruit, and without distended neck veins. Respiratory: Lungs are clear to auscultation. Skin:  Without evidence of edema, or rash Trunk: BMI is  elevated and patient  has normal posture.  Neurologic exam : The patient is awake and alert, oriented to place and time.  Memory subjective  described as intact.  There is a normal attention span & concentration ability.  Speech is fluent without dysarthria, but mld dysphonia noted, not aphasia.  Mood and affect are appropriate. Cranial nerves: No lost of taste or smell. Pupils are equal and briskly reactive to light. Funduscopic exam without  evidence of pallor or  edema.  Extraocular movements  in vertical and horizontal planes intact and without nystagmus. Visual fields by finger perimetry are intact. Hearing to finger rub intact with hearing aids. . Facial sensation intact to fine touch.  Facial motor strength is symmetric and tongue and uvula move midline. Motor exam:   Normal tone and normal muscle bulk and symmetric normal strength in all extremities.   Assessment:  After physical and neurologic examination, review of laboratory studies, imaging, neurophysiology testing and this patient carries the diagnosis of cataplexy and narcolepsy in 1984, after suffering severe cataplectic attacks.  Sleep hallucinations are rare now. There is no longer access to the original sleep studies.  Plan:  Treatment plan and additional workup :  Intended to have Amanda King back for and a sleep study followed by an M SLT but she was unable to stay off her medicine especially now that she needs to maintain employment. His and I will indefinitely postpone a repeat sleep study she is doing well right now on Tofranil and Ritalin and if we need to place her on Xyrem in the future we would have to do these studies first.  HLA testing for narcolepsy with cataplexy was positive for HLA - she has clincally all symptoms and she is doing better on Tofranil and Ritalin, but was denied XYREM,  medicare coverage has finally failed for this patient.  She had once not taking the medication for 4 days and suffered from severe sleep a hallucinations, vivid and scary dreams came back. The patient has been treated successfully and now returned to a paid work , 4 days at [employer] and 3 days at [employer].     I will Refill Tofranil,  Refilled  Ritalin, prn , but usually twice daily.      Shivangi Lutz, MD   Cc; Dr Glori Bickers

## 2016-08-03 ENCOUNTER — Telehealth: Payer: Self-pay | Admitting: Family Medicine

## 2016-08-03 DIAGNOSIS — E1165 Type 2 diabetes mellitus with hyperglycemia: Secondary | ICD-10-CM

## 2016-08-03 DIAGNOSIS — E039 Hypothyroidism, unspecified: Secondary | ICD-10-CM

## 2016-08-03 DIAGNOSIS — I10 Essential (primary) hypertension: Secondary | ICD-10-CM

## 2016-08-03 DIAGNOSIS — IMO0001 Reserved for inherently not codable concepts without codable children: Secondary | ICD-10-CM

## 2016-08-03 NOTE — Telephone Encounter (Signed)
-----   Message from Alvina Chouerri J Walsh sent at 08/01/2016  3:34 PM EST ----- Regarding: Lab orders for Wednesday, 3.7.18 Lab orders for a 3 month follow up appt.

## 2016-08-06 ENCOUNTER — Other Ambulatory Visit: Payer: Self-pay | Admitting: Family Medicine

## 2016-08-13 ENCOUNTER — Other Ambulatory Visit (INDEPENDENT_AMBULATORY_CARE_PROVIDER_SITE_OTHER): Payer: Medicare Other

## 2016-08-13 DIAGNOSIS — IMO0001 Reserved for inherently not codable concepts without codable children: Secondary | ICD-10-CM

## 2016-08-13 DIAGNOSIS — I10 Essential (primary) hypertension: Secondary | ICD-10-CM | POA: Diagnosis not present

## 2016-08-13 DIAGNOSIS — E039 Hypothyroidism, unspecified: Secondary | ICD-10-CM | POA: Diagnosis not present

## 2016-08-13 DIAGNOSIS — E1165 Type 2 diabetes mellitus with hyperglycemia: Secondary | ICD-10-CM | POA: Diagnosis not present

## 2016-08-13 LAB — BASIC METABOLIC PANEL
BUN: 24 mg/dL — ABNORMAL HIGH (ref 6–23)
CO2: 29 mEq/L (ref 19–32)
Calcium: 9.3 mg/dL (ref 8.4–10.5)
Chloride: 101 mEq/L (ref 96–112)
Creatinine, Ser: 0.95 mg/dL (ref 0.40–1.20)
GFR: 60.66 mL/min (ref >60.00)
GLUCOSE: 167 mg/dL — AB (ref 70–99)
POTASSIUM: 4 meq/L (ref 3.5–5.1)
Sodium: 136 mEq/L (ref 135–145)

## 2016-08-13 LAB — HEMOGLOBIN A1C: HEMOGLOBIN A1C: 7.3 % — AB (ref 4.6–6.5)

## 2016-08-13 LAB — TSH: TSH: 2.04 u[IU]/mL (ref 0.35–4.50)

## 2016-08-18 ENCOUNTER — Ambulatory Visit: Payer: Medicare Other | Admitting: Family Medicine

## 2016-08-21 ENCOUNTER — Ambulatory Visit: Payer: Medicare Other | Admitting: Family Medicine

## 2016-08-25 ENCOUNTER — Other Ambulatory Visit: Payer: Self-pay | Admitting: Family Medicine

## 2016-08-25 ENCOUNTER — Ambulatory Visit (INDEPENDENT_AMBULATORY_CARE_PROVIDER_SITE_OTHER): Payer: Medicare Other | Admitting: Family Medicine

## 2016-08-25 ENCOUNTER — Encounter: Payer: Self-pay | Admitting: Family Medicine

## 2016-08-25 VITALS — BP 130/62 | HR 97 | Temp 98.1°F | Ht 61.25 in | Wt 156.0 lb

## 2016-08-25 DIAGNOSIS — J0100 Acute maxillary sinusitis, unspecified: Secondary | ICD-10-CM | POA: Diagnosis not present

## 2016-08-25 DIAGNOSIS — J019 Acute sinusitis, unspecified: Secondary | ICD-10-CM | POA: Insufficient documentation

## 2016-08-25 MED ORDER — AZITHROMYCIN 250 MG PO TABS: ORAL_TABLET | ORAL | 0 refills | Status: DC

## 2016-08-25 MED ORDER — GUAIFENESIN-CODEINE 100-10 MG/5ML PO SYRP: 5.0000 mL | ORAL_SOLUTION | Freq: Four times a day (QID) | ORAL | 0 refills | Status: DC | PRN

## 2016-08-25 NOTE — Patient Instructions (Signed)
Get lots of fluids and rest Nasal saline spray is good for congestion  Take the zithromax for sinus infection  Try the codeine cough syrup with caution of sedation   Update if not starting to improve in a week or if worsening

## 2016-08-25 NOTE — Progress Notes (Signed)
Subjective:    Patient ID: Amanda King, female    DOB: [birth year], 76 y.o.   MRN: [id number]  HPI Here with cough and congestion  Temp: 98.1 F (36.7 C)   Low grade temp last week - that is better   Symptoms started 10 days ago  Samuel [place] symptoms  Mostly in her chest  Some pnd  Some pressure around her nose   Now she cannot quit coughing  Cough is productive -mostly clear mucous    2 bottles of robitussin in a week  Alka selzer flu   Exhausted    Patient Active Problem List   Diagnosis Date Noted  . Acute sinusitis 08/25/2016  . Routine general medical examination at a health care facility 01/31/2016  . Narcolepsy with cataplexy 10/31/2014  . Stress reaction 03/28/2014  . Encounter for Medicare annual wellness exam 09/22/2013  . Hypersomnia, persistent 07/15/2013  . Foot abrasion 04/09/2012  . Other screening mammogram 12/31/2011  . Colon cancer screening 12/31/2011  . Obesity 07/01/2011  . ANXIETY 05/06/2010  . DERMATOPHYTOSIS OF NAIL 12/21/2008  . Hypothyroidism 09/02/2006  . Diabetes type 2, uncontrolled ([place]) 09/02/2006  . Hyperlipidemia LDL goal <100 09/02/2006  . NARCOLEPSY W/CATAPLEXY 09/02/2006  . Essential hypertension 09/02/2006  . PNEUMONIA, HX OF 09/02/2006   Past Medical History:  Diagnosis Date  . Diabetes mellitus    Type II  . Hyperlipidemia   . Hypertension   . Hypothyroidism   . Migraine   . Narcolepsy with cataplexy   . Pneumonia 08/2001   Past Surgical History:  Procedure Laterality Date  . APPENDECTOMY  age 74  . hemilaminectomy     C5-6; C6-7  . PARTIAL HYSTERECTOMY  age 62  . POSTERIOR CERVICAL FUSION/FORAMINOTOMY  02/05/1975   C5-C6-C7   Social History  Substance Use Topics  . Smoking status: Former Smoker    Quit date: 06/09/1980  . Smokeless tobacco: Never Used  . Alcohol use No   Family History  Problem Relation Age of Onset  . Depression Brother     suicide  . Diabetes Brother   . Heart disease Mother     MI  .  Peripheral vascular disease Mother   . Hyperlipidemia Mother   . Diabetes Brother   . Sleep apnea Son   . Sleep apnea Brother    Allergies  Allergen Reactions  . Penicillins     REACTION: GI upset   Current Outpatient Prescriptions on File Prior to Visit  Medication Sig Dispense Refill  . aspirin 81 MG tablet Take 81 mg by mouth daily.      . Blood Glucose Monitoring Suppl (BLOOD GLUCOSE MONITOR KIT) KIT Check blood sugar once daily as needed for DM     . cetirizine (ZYRTEC) 10 MG tablet Take 10 mg by mouth daily.    Marland Kitchen glipiZIDE (GLUCOTROL XL) 10 MG 24 hr tablet TAKE ONE TABLET BY MOUTH DAILY WITH BREAKFAST 90 tablet 0  . glucose blood test strip Check blood sugar once daily as needed for DM.     . hydrochlorothiazide (HYDRODIURIL) 25 MG tablet TAKE ONE TABLET BY MOUTH ONCE DAILY 90 tablet 0  . ibuprofen (ADVIL,MOTRIN) 200 MG tablet Take 200 mg by mouth every 6 (six) hours as needed.      Marland Kitchen imipramine (TOFRANIL) 50 MG tablet Take 1 tablet (50 mg total) by mouth at bedtime. 90 tablet 1  . levothyroxine (SYNTHROID, LEVOTHROID) 88 MCG tablet Take 1 tablet (88 mcg total) by mouth daily.  30 tablet 11  . lisinopril (PRINIVIL,ZESTRIL) 20 MG tablet Take 1 tablet (20 mg total) by mouth daily. 90 tablet 3  . metFORMIN (GLUCOPHAGE-XR) 750 MG 24 hr tablet Take 1 tablet (750 mg total) by mouth 2 (two) times daily. 60 tablet 11  . methylphenidate (RITALIN) 20 MG tablet Take 1 tablet (20 mg total) by mouth 3 (three) times daily with meals. 180 tablet 0  . simvastatin (ZOCOR) 80 MG tablet TAKE ONE TABLET BY MOUTH AT BEDTIME 90 tablet 0  . sitaGLIPtin (JANUVIA) 50 MG tablet Take 1 tablet (50 mg total) by mouth daily. 30 tablet 11  . zoster vaccine live, PF, (ZOSTAVAX) 46270 UNT/0.65ML injection Inject 19,400 Units into the skin once. (Patient not taking: Reported on 08/25/2016) 1 vial 0   No current facility-administered medications on file prior to visit.     Review of Systems  Constitutional:  Positive for appetite change. Negative for fatigue and fever.  HENT: Positive for congestion, ear pain, postnasal drip, rhinorrhea, sinus pressure and sore throat. Negative for nosebleeds.   Eyes: Negative for pain, redness and itching.  Respiratory: Positive for cough. Negative for shortness of breath and wheezing.   Cardiovascular: Negative for chest pain.  Gastrointestinal: Negative for abdominal pain, diarrhea, nausea and vomiting.  Endocrine: Negative for polyuria.  Genitourinary: Negative for dysuria, frequency and urgency.  Musculoskeletal: Negative for arthralgias and myalgias.  Allergic/Immunologic: Negative for immunocompromised state.  Neurological: Positive for headaches. Negative for dizziness, tremors, syncope, weakness and numbness.  Hematological: Negative for adenopathy. Does not bruise/bleed easily.  Psychiatric/Behavioral: Negative for dysphoric mood. The patient is not nervous/anxious.        Objective:   Physical Exam  Constitutional: She appears well-developed and well-nourished. No distress.  Well appearing  Hoarse voice  HENT:  Head: Normocephalic and atraumatic.  Right Ear: External ear normal.  Left Ear: External ear normal.  Mouth/Throat: Oropharynx is clear and moist. No oropharyngeal exudate.  Nares are injected and congested  Bilateral maxillary sinus tenderness (worse on L)  L nare has purulent drainage  Post nasal drip   Eyes: Conjunctivae and EOM are normal. Pupils are equal, round, and reactive to light. Right eye exhibits no discharge. Left eye exhibits no discharge.  Neck: Normal range of motion. Neck supple.  Cardiovascular: Normal rate and regular rhythm.   Pulmonary/Chest: Effort normal and breath sounds normal. No respiratory distress. She has no wheezes. She has no rales.  bs are harsh Good air exch No wheeze or rales   Lymphadenopathy:    She has no cervical adenopathy.  Neurological: She is alert. No cranial nerve deficit.  Skin: Skin  is warm and dry. No rash noted.  Psychiatric: She has a normal mood and affect.          Assessment & Plan:   Problem List Items Addressed This Visit      Respiratory   Acute sinusitis    s/p uri  L maxillary sinus tenderness and purulent nasal drainage  Cover with zpak (all to pcn) [facility] for cough as directed with caution of sedation  Disc symptomatic care - see instructions on AVS  Update if not starting to improve in a week or if worsening   Off work today/tomorrow      Relevant Medications   guaiFENesin-codeine (ROBITUSSIN AC) 100-10 MG/5ML syrup   azithromycin (ZITHROMAX Z-PAK) 250 MG tablet

## 2016-08-25 NOTE — Progress Notes (Signed)
Pre visit review using our clinic review tool, if applicable. No additional management support is needed unless otherwise documented below in the visit note. 

## 2016-08-25 NOTE — Assessment & Plan Note (Signed)
s/p uri  L maxillary sinus tenderness and purulent nasal drainage  Cover [facility] (all to pcn) [facility] as directed with caution of sedation  Disc symptomatic care - see instructions on AVS  Update if not starting to improve in a week or if worsening   Off work today/tomorrow

## 2016-09-01 ENCOUNTER — Encounter: Payer: Self-pay | Admitting: Family Medicine

## 2016-09-01 ENCOUNTER — Ambulatory Visit (INDEPENDENT_AMBULATORY_CARE_PROVIDER_SITE_OTHER): Payer: Medicare Other | Admitting: Family Medicine

## 2016-09-01 VITALS — BP 126/58 | HR 97 | Temp 97.7°F | Ht 61.25 in | Wt 157.5 lb

## 2016-09-01 DIAGNOSIS — E663 Overweight: Secondary | ICD-10-CM | POA: Diagnosis not present

## 2016-09-01 DIAGNOSIS — IMO0001 Reserved for inherently not codable concepts without codable children: Secondary | ICD-10-CM

## 2016-09-01 DIAGNOSIS — E039 Hypothyroidism, unspecified: Secondary | ICD-10-CM

## 2016-09-01 DIAGNOSIS — J0100 Acute maxillary sinusitis, unspecified: Secondary | ICD-10-CM | POA: Diagnosis not present

## 2016-09-01 DIAGNOSIS — E1165 Type 2 diabetes mellitus with hyperglycemia: Secondary | ICD-10-CM | POA: Diagnosis not present

## 2016-09-01 DIAGNOSIS — I10 Essential (primary) hypertension: Secondary | ICD-10-CM

## 2016-09-01 MED ORDER — GUAIFENESIN-CODEINE 100-10 MG/5ML PO SYRP: 5.0000 mL | ORAL_SOLUTION | Freq: Four times a day (QID) | ORAL | 0 refills | Status: DC | PRN

## 2016-09-01 NOTE — Assessment & Plan Note (Signed)
Improved! Lab Results  Component Value Date   HGBA1C 7.3 (H) 08/13/2016   Goal is A1C below 7- pt will continue better diet  Exercise handout given  She will get eye exam next week  Continue current medicines f/u 3 mo

## 2016-09-01 NOTE — Progress Notes (Signed)
Subjective:    Patient ID: Amanda King, female    DOB: 03/15/1941, 76 y.o.   MRN: [id number]  HPI  Here for f/u of chronic medical problems  Was also seen for sinusitis on 3/19 Getting better-still coughing -wants refill on robitussin ac   Wt Readings from Last 3 Encounters:  09/01/16 157 lb 8 oz (71.4 kg)  08/25/16 156 lb (70.8 kg)  06/30/16 159 lb (72.1 kg)  stable  bmi 29.5  bp is stable today  No cp or palpitations or headaches or edema  No side effects to medicines  BP Readings from Last 3 Encounters:  09/01/16 (!) 126/58  08/25/16 130/62  06/30/16 129/69     Hypothyroidism  Pt has no clinical changes No change in energy level/ hair or skin/ edema and no tremor Lab Results  Component Value Date   TSH 2.04 08/13/2016      Diabetes Home sugar results =no hypoglycemia  DM diet - eating quite a bit better - lots of fish/ lean protein  Exercise - until she got sick/ was getting out more (has a new puppy and playing outside)  Symptoms A1C last  Lab Results  Component Value Date   HGBA1C 7.3 (H) 08/13/2016  improved from 8.1 ! Thrilled about that   No problems with medications  Renal protection ace  Last eye exam  - she plans to schedule next week    Patient Active Problem List   Diagnosis Date Noted  . Acute sinusitis 08/25/2016  . Routine general medical examination at a health care facility 01/31/2016  . Narcolepsy with cataplexy 10/31/2014  . Stress reaction 03/28/2014  . Encounter for Medicare annual wellness exam 09/22/2013  . Hypersomnia, persistent 07/15/2013  . Other screening mammogram 12/31/2011  . Colon cancer screening 12/31/2011  . Overweight (BMI 25.0-29.9) 07/01/2011  . ANXIETY 05/06/2010  . DERMATOPHYTOSIS OF NAIL 12/21/2008  . Hypothyroidism 09/02/2006  . Diabetes type 2, uncontrolled ([place]) 09/02/2006  . Hyperlipidemia LDL goal <100 09/02/2006  . NARCOLEPSY W/CATAPLEXY 09/02/2006  . Essential hypertension 09/02/2006  .  PNEUMONIA, HX OF 09/02/2006   Past Medical History:  Diagnosis Date  . Diabetes mellitus    Type II  . Hyperlipidemia   . Hypertension   . Hypothyroidism   . Migraine   . Narcolepsy with cataplexy   . Pneumonia 08/2001   Past Surgical History:  Procedure Laterality Date  . APPENDECTOMY  age 49  . hemilaminectomy     C5-6; C6-7  . PARTIAL HYSTERECTOMY  age 3  . POSTERIOR CERVICAL FUSION/FORAMINOTOMY  02/05/1975   C5-C6-C7   Social History  Substance Use Topics  . Smoking status: Former Smoker    Quit date: 06/09/1980  . Smokeless tobacco: Never Used  . Alcohol use No   Family History  Problem Relation Age of Onset  . Depression Brother     suicide  . Diabetes Brother   . Heart disease Mother     MI  . Peripheral vascular disease Mother   . Hyperlipidemia Mother   . Diabetes Brother   . Sleep apnea Son   . Sleep apnea Brother    Allergies  Allergen Reactions  . Penicillins     REACTION: GI upset   Current Outpatient Prescriptions on File Prior to Visit  Medication Sig Dispense Refill  . aspirin 81 MG tablet Take 81 mg by mouth daily.      . Blood Glucose Monitoring Suppl (BLOOD GLUCOSE MONITOR KIT) KIT Check blood sugar  once daily as needed for DM     . cetirizine (ZYRTEC) 10 MG tablet Take 10 mg by mouth daily.    Marland Kitchen glipiZIDE (GLUCOTROL XL) 10 MG 24 hr tablet TAKE ONE TABLET BY MOUTH DAILY WITH BREAKFAST 90 tablet 0  . glucose blood test strip Check blood sugar once daily as needed for DM.     . hydrochlorothiazide (HYDRODIURIL) 25 MG tablet TAKE ONE TABLET BY MOUTH ONCE DAILY 90 tablet 0  . ibuprofen (ADVIL,MOTRIN) 200 MG tablet Take 200 mg by mouth every 6 (six) hours as needed.      Marland Kitchen imipramine (TOFRANIL) 50 MG tablet Take 1 tablet (50 mg total) by mouth at bedtime. 90 tablet 1  . levothyroxine (SYNTHROID, LEVOTHROID) 88 MCG tablet Take 1 tablet (88 mcg total) by mouth daily. 30 tablet 11  . lisinopril (PRINIVIL,ZESTRIL) 20 MG tablet TAKE ONE TABLET BY MOUTH  DAILY 90 tablet 0  . metFORMIN (GLUCOPHAGE-XR) 750 MG 24 hr tablet Take 1 tablet (750 mg total) by mouth 2 (two) times daily. 60 tablet 11  . methylphenidate (RITALIN) 20 MG tablet Take 1 tablet (20 mg total) by mouth 3 (three) times daily with meals. 180 tablet 0  . simvastatin (ZOCOR) 80 MG tablet TAKE ONE TABLET BY MOUTH AT BEDTIME 90 tablet 0  . sitaGLIPtin (JANUVIA) 50 MG tablet Take 1 tablet (50 mg total) by mouth daily. 30 tablet 11  . zoster vaccine live, PF, (ZOSTAVAX) 06301 UNT/0.65ML injection Inject 19,400 Units into the skin once. (Patient not taking: Reported on 09/01/2016) 1 vial 0   No current facility-administered medications on file prior to visit.     Review of Systems Review of Systems  Constitutional: Negative for fever, appetite change, fatigue and unexpected weight change.  Eyes: Negative for pain and visual disturbance.  Respiratory: Negative for cough and shortness of breath.   Cardiovascular: Negative for cp or palpitations    Gastrointestinal: Negative for nausea, diarrhea and constipation.  Genitourinary: Negative for urgency and frequency.  Skin: Negative for pallor or rash   Neurological: Negative for weakness, light-headedness, numbness and headaches.  Hematological: Negative for adenopathy. Does not bruise/bleed easily.  Psychiatric/Behavioral: Negative for dysphoric mood. The patient is not nervous/anxious.         Objective:   Physical Exam  Constitutional: She appears well-developed and well-nourished. No distress.  overwt and well appearing   HENT:  Head: Normocephalic and atraumatic.  Mouth/Throat: Oropharynx is clear and moist.  Mild nasal congestion   Eyes: Conjunctivae and EOM are normal. Pupils are equal, round, and reactive to light.  Neck: Normal range of motion. Neck supple. No JVD present. Carotid bruit is not present. No thyromegaly present.  Cardiovascular: Normal rate, regular rhythm, normal heart sounds and intact distal pulses.  Exam  reveals no gallop.   Pulmonary/Chest: Effort normal and breath sounds normal. No respiratory distress. She has no wheezes. She has no rales.  No crackles  Good air exch No wheeze Some upper airway sounds  Abdominal: Soft. Bowel sounds are normal. She exhibits no distension, no abdominal bruit and no mass. There is no tenderness.  Musculoskeletal: She exhibits no edema.  Lymphadenopathy:    She has no cervical adenopathy.  Neurological: She is alert. She has normal reflexes.  Skin: Skin is warm and dry. No rash noted.  Psychiatric: She has a normal mood and affect.          Assessment & Plan:   Problem List Items Addressed This Visit  Cardiovascular and Mediastinum   Essential hypertension - Primary    bp in fair control at this time  BP Readings from Last 1 Encounters:  09/01/16 (!) 126/58   No changes needed Disc lifstyle change with low sodium diet and exercise  Labs reviewed         Respiratory   Acute sinusitis    Much improved Still coughing  Rob AC refilled with cautions  Reassuring exam       Relevant Medications   guaiFENesin-codeine (ROBITUSSIN AC) 100-10 MG/5ML syrup     Endocrine   Diabetes type 2, uncontrolled ([place])    Improved! Lab Results  Component Value Date   HGBA1C 7.3 (H) 08/13/2016   Goal is A1C below 7- pt will continue better diet  Exercise handout given  She will get eye exam next week  Continue current medicines f/u 3 mo       Hypothyroidism    Hypothyroidism  Pt has no clinical changes No change in energy level/ hair or skin/ edema and no tremor Lab Results  Component Value Date   TSH 2.04 08/13/2016            Other   Overweight (BMI 25.0-29.9)    Discussed how this problem influences overall health and the risks it imposes  Reviewed plan for weight loss with lower calorie diet (via better food choices and also portion control or program like weight watchers) and exercise building up to or more than 30 minutes 5  days per week including some aerobic activity

## 2016-09-01 NOTE — Patient Instructions (Signed)
Take care of yourself  Keep up regular exercise at least 30 minutes 5 days per week  Keep eating a diabetic diet Don't forget to get your eye exam next week   Follow up in 3 months with lab prior

## 2016-09-01 NOTE — Assessment & Plan Note (Signed)
Discussed how this problem influences overall health and the risks it imposes  Reviewed plan for weight loss with lower calorie diet (via better food choices and also portion control or program like weight watchers) and exercise building up to or more than 30 minutes 5 days per week including some aerobic activity    

## 2016-09-01 NOTE — Assessment & Plan Note (Signed)
Hypothyroidism  Pt has no clinical changes No change in energy level/ hair or skin/ edema and no tremor Lab Results  Component Value Date   TSH 2.04 08/13/2016

## 2016-09-01 NOTE — Assessment & Plan Note (Signed)
bp in fair control at this time  BP Readings from Last 1 Encounters:  09/01/16 (!) 126/58   No changes needed Disc lifstyle change with low sodium diet and exercise  Labs reviewed

## 2016-09-01 NOTE — Progress Notes (Signed)
Pre visit review using our clinic review tool, if applicable. No additional management support is needed unless otherwise documented below in the visit note. 

## 2016-09-01 NOTE — Assessment & Plan Note (Signed)
Much improved Still coughing  Rob AC refilled with cautions  Reassuring exam

## 2016-09-02 ENCOUNTER — Other Ambulatory Visit: Payer: Self-pay | Admitting: Family Medicine

## 2016-09-20 ENCOUNTER — Other Ambulatory Visit: Payer: Self-pay | Admitting: Family Medicine

## 2016-11-11 ENCOUNTER — Other Ambulatory Visit: Payer: Self-pay | Admitting: Family Medicine

## 2016-12-05 ENCOUNTER — Other Ambulatory Visit (INDEPENDENT_AMBULATORY_CARE_PROVIDER_SITE_OTHER): Payer: Medicare Other

## 2016-12-05 DIAGNOSIS — I10 Essential (primary) hypertension: Secondary | ICD-10-CM | POA: Diagnosis not present

## 2016-12-05 DIAGNOSIS — E1165 Type 2 diabetes mellitus with hyperglycemia: Secondary | ICD-10-CM

## 2016-12-05 DIAGNOSIS — IMO0001 Reserved for inherently not codable concepts without codable children: Secondary | ICD-10-CM

## 2016-12-05 LAB — LIPID PANEL
CHOL/HDL RATIO: 5
CHOLESTEROL: 208 mg/dL — AB (ref 0–200)
HDL: 41.1 mg/dL (ref >39.00)
NONHDL: 166.78
Triglycerides: 368 mg/dL — ABNORMAL HIGH (ref 0.0–149.0)
VLDL: 73.6 mg/dL — AB (ref 0.0–40.0)

## 2016-12-05 LAB — COMPREHENSIVE METABOLIC PANEL
ALT: 20 U/L (ref 0–35)
AST: 16 U/L (ref 0–37)
Albumin: 4.3 g/dL (ref 3.5–5.2)
Alkaline Phosphatase: 85 U/L (ref 39–117)
BILIRUBIN TOTAL: 0.3 mg/dL (ref 0.2–1.2)
BUN: 29 mg/dL — AB (ref 6–23)
CHLORIDE: 100 meq/L (ref 96–112)
CO2: 29 meq/L (ref 19–32)
CREATININE: 1.18 mg/dL (ref 0.40–1.20)
Calcium: 9.3 mg/dL (ref 8.4–10.5)
GFR: 47.2 mL/min — ABNORMAL LOW (ref >60.00)
GLUCOSE: 170 mg/dL — AB (ref 70–99)
Potassium: 3.9 mEq/L (ref 3.5–5.1)
SODIUM: 137 meq/L (ref 135–145)
Total Protein: 7.3 g/dL (ref 6.0–8.3)

## 2016-12-05 LAB — LDL CHOLESTEROL, DIRECT: LDL DIRECT: 99 mg/dL

## 2016-12-05 LAB — HEMOGLOBIN A1C: Hgb A1c MFr Bld: 7.5 % — ABNORMAL HIGH (ref 4.6–6.5)

## 2016-12-10 ENCOUNTER — Other Ambulatory Visit: Payer: Self-pay | Admitting: Family Medicine

## 2016-12-16 ENCOUNTER — Encounter: Payer: Self-pay | Admitting: Family Medicine

## 2016-12-16 ENCOUNTER — Ambulatory Visit (INDEPENDENT_AMBULATORY_CARE_PROVIDER_SITE_OTHER): Payer: Medicare Other | Admitting: Family Medicine

## 2016-12-16 VITALS — BP 128/60 | HR 88 | Temp 97.7°F | Ht 61.25 in | Wt 158.2 lb

## 2016-12-16 DIAGNOSIS — I10 Essential (primary) hypertension: Secondary | ICD-10-CM | POA: Diagnosis not present

## 2016-12-16 DIAGNOSIS — E785 Hyperlipidemia, unspecified: Secondary | ICD-10-CM | POA: Diagnosis not present

## 2016-12-16 DIAGNOSIS — E663 Overweight: Secondary | ICD-10-CM

## 2016-12-16 DIAGNOSIS — E1165 Type 2 diabetes mellitus with hyperglycemia: Secondary | ICD-10-CM | POA: Diagnosis not present

## 2016-12-16 DIAGNOSIS — IMO0001 Reserved for inherently not codable concepts without codable children: Secondary | ICD-10-CM

## 2016-12-16 MED ORDER — SITAGLIPTIN PHOSPHATE 100 MG PO TABS: 100.0000 mg | ORAL_TABLET | Freq: Every day | ORAL | 11 refills | Status: DC

## 2016-12-16 NOTE — Assessment & Plan Note (Signed)
Disc goals for lipids and reasons to control them Rev labs with pt Rev low sat fat diet in detail  Continue simvastatin 80  Work on carbs in diet for trig

## 2016-12-16 NOTE — Patient Instructions (Addendum)
Review the diabetic education materials you have and try to stick to a diabetic diet  Get to the gym  Aim for at least 30 minutes of exercise 5 days per week   Please give up ice cream-it is the highest sugar and highest fat food you can eat   Please schedule your eye exam!  Take care of yourself   Cholesterol went up also  Avoid red meat/ fried foods/ egg yolks/ fatty breakfast meats/ butter, cheese and high fat dairy/ and shellfish    Increase your januvia to 100 mg once daily  Check your blood sugar twice daily and keep a log of it   Follow up in 3 months with labs prior

## 2016-12-16 NOTE — Progress Notes (Signed)
Subjective:    Patient ID: Amanda King, female    DOB: Sep 15, 1940, 76 y.o.   MRN: [id number]  HPI Here for f/u of chronic health problems   Having some shoulder problems - lifting 2L bottles   She joined a gym recently with her sister - will start next week   Wt Readings from Last 3 Encounters:  12/16/16 158 lb 4 oz (71.8 kg)  09/01/16 157 lb 8 oz (71.4 kg)  08/25/16 156 lb (70.8 kg)  up 1 lb  Diet is ok overall - avoiding sweets  bmi 29.6   bp is up on first check today - pt thinks it is because she worked all night  Missed medicine for a few days last week  No cp or palpitations or headaches or edema  No side effects to medicines  BP Readings from Last 3 Encounters:  12/16/16 (!) 144/62  09/01/16 (!) 126/58  08/25/16 130/62     On hctz 25 mg Lisinopril 20 mg     Chemistry      Component Value Date/Time   NA 137 12/05/2016 0908   K 3.9 12/05/2016 0908   CL 100 12/05/2016 0908   CO2 29 12/05/2016 0908   BUN 29 (H) 12/05/2016 0908   CREATININE 1.18 12/05/2016 0908      Component Value Date/Time   CALCIUM 9.3 12/05/2016 0908   ALKPHOS 85 12/05/2016 0908   AST 16 12/05/2016 0908   ALT 20 12/05/2016 0908   BILITOT 0.3 12/05/2016 0908     glucose was 170   Diabetes Home sugar results - she has not had any high levels , usually runs in the 130s to 140s - usually in the evenings  DM diet - watching sweets / it is very hard for her to watch carbs (laying off potato chips) Has been to DM teaching in the past/has materials  Exercise -walking some/not enough- will start at the gym next week and work with a trainer  Symptoms A1C last  Lab Results  Component Value Date   HGBA1C 7.5 (H) 12/05/2016  this is up from 7.3 On Januvia 50 mg she is open to increasing this  Glipizide xl 10 mg  No low glucose levels   No problems with medications  Renal protection- ace  Last eye exam  - (she has not had yet- caring for family members) -she will schedule    Hyperlipidemia Lab Results  Component Value Date   CHOL 208 (H) 12/05/2016   CHOL 186 05/13/2016   CHOL 213 (H) 02/05/2016   Lab Results  Component Value Date   HDL 41.10 12/05/2016   HDL 41.10 05/13/2016   HDL 42.30 02/05/2016   Lab Results  Component Value Date   LDLCALC 73 06/21/2014   LDLCALC 49 09/14/2013   LDLCALC 51 12/31/2011   Lab Results  Component Value Date   TRIG 368.0 (H) 12/05/2016   TRIG 310.0 (H) 05/13/2016   TRIG 385.0 (H) 02/05/2016   Lab Results  Component Value Date   CHOLHDL 5 12/05/2016   CHOLHDL 5 05/13/2016   CHOLHDL 5 02/05/2016   Lab Results  Component Value Date   LDLDIRECT 99.0 12/05/2016   LDLDIRECT 87.0 05/13/2016   LDLDIRECT 102.0 02/05/2016   On zocor 80 mg  Trig and LDL both went up    Patient Active Problem List   Diagnosis Date Noted  . Routine general medical examination at a health care facility 01/31/2016  . Narcolepsy with cataplexy 10/31/2014  .  Stress reaction 03/28/2014  . Encounter for Medicare annual wellness exam 09/22/2013  . Hypersomnia, persistent 07/15/2013  . Other screening mammogram 12/31/2011  . Colon cancer screening 12/31/2011  . Overweight (BMI 25.0-29.9) 07/01/2011  . ANXIETY 05/06/2010  . DERMATOPHYTOSIS OF NAIL 12/21/2008  . Hypothyroidism 09/02/2006  . Diabetes type 2, uncontrolled ([place]) 09/02/2006  . Hyperlipidemia LDL goal <100 09/02/2006  . NARCOLEPSY W/CATAPLEXY 09/02/2006  . Essential hypertension 09/02/2006  . PNEUMONIA, HX OF 09/02/2006   Past Medical History:  Diagnosis Date  . Diabetes mellitus    Type II  . Hyperlipidemia   . Hypertension   . Hypothyroidism   . Migraine   . Narcolepsy with cataplexy   . Pneumonia 08/2001   Past Surgical History:  Procedure Laterality Date  . APPENDECTOMY  age 18  . hemilaminectomy     C5-6; C6-7  . PARTIAL HYSTERECTOMY  age 70  . POSTERIOR CERVICAL FUSION/FORAMINOTOMY  02/05/1975   C5-C6-C7   Social History  Substance Use Topics  .  Smoking status: Former Smoker    Quit date: 06/09/1980  . Smokeless tobacco: Never Used  . Alcohol use No   Family History  Problem Relation Age of Onset  . Depression Brother        suicide  . Diabetes Brother   . Heart disease Mother        MI  . Peripheral vascular disease Mother   . Hyperlipidemia Mother   . Diabetes Brother   . Sleep apnea Son   . Sleep apnea Brother    Allergies  Allergen Reactions  . Penicillins     REACTION: GI upset   Current Outpatient Prescriptions on File Prior to Visit  Medication Sig Dispense Refill  . aspirin 81 MG tablet Take 81 mg by mouth daily.      . Blood Glucose Monitoring Suppl (BLOOD GLUCOSE MONITOR KIT) KIT Check blood sugar once daily as needed for DM     . cetirizine (ZYRTEC) 10 MG tablet Take 10 mg by mouth daily.    Marland Kitchen glipiZIDE (GLUCOTROL XL) 10 MG 24 hr tablet TAKE 1 TABLET BY MOUTH ONCE DAILY WITH BREAKFAST 90 tablet 0  . glucose blood test strip Check blood sugar once daily as needed for DM.     Marland Kitchen guaiFENesin-codeine (ROBITUSSIN AC) 100-10 MG/5ML syrup Take 5 mLs by mouth 4 (four) times daily as needed for cough. 120 mL 0  . hydrochlorothiazide (HYDRODIURIL) 25 MG tablet TAKE ONE TABLET BY MOUTH ONCE DAILY 90 tablet 1  . ibuprofen (ADVIL,MOTRIN) 200 MG tablet Take 200 mg by mouth every 6 (six) hours as needed.      Marland Kitchen imipramine (TOFRANIL) 50 MG tablet Take 1 tablet (50 mg total) by mouth at bedtime. 90 tablet 1  . levothyroxine (SYNTHROID, LEVOTHROID) 88 MCG tablet Take 1 tablet (88 mcg total) by mouth daily. 30 tablet 11  . lisinopril (PRINIVIL,ZESTRIL) 20 MG tablet TAKE 1 TABLET BY MOUTH ONCE DAILY 90 tablet 1  . metFORMIN (GLUCOPHAGE-XR) 750 MG 24 hr tablet TAKE ONE TABLET BY MOUTH TWICE DAILY 180 tablet 1  . methylphenidate (RITALIN) 20 MG tablet Take 1 tablet (20 mg total) by mouth 3 (three) times daily with meals. 180 tablet 0  . simvastatin (ZOCOR) 80 MG tablet TAKE 1 TABLET BY MOUTH AT BEDTIME 90 tablet 0  . zoster vaccine  live, PF, (ZOSTAVAX) 03704 UNT/0.65ML injection Inject 19,400 Units into the skin once. 1 vial 0   No current facility-administered medications on file prior  to visit.     Review of Systems Review of Systems  Constitutional: Negative for fever, appetite change, fatigue and unexpected weight change.  Eyes: Negative for pain and visual disturbance.  Respiratory: Negative for cough and shortness of breath.   Cardiovascular: Negative for cp or palpitations    Gastrointestinal: Negative for nausea, diarrhea and constipation.  Genitourinary: Negative for urgency and frequency.  Skin: Negative for pallor or rash   MSK pos for R shoulder pain Neurological: Negative for weakness, light-headedness, numbness and headaches.  Hematological: Negative for adenopathy. Does not bruise/bleed easily.  Psychiatric/Behavioral: Negative for dysphoric mood. The patient is not nervous/anxious.         Objective:   Physical Exam  Constitutional: She appears well-developed and well-nourished. No distress.  overwt and well app  HENT:  Head: Normocephalic and atraumatic.  Mouth/Throat: Oropharynx is clear and moist.  Eyes: Conjunctivae and EOM are normal. Pupils are equal, round, and reactive to light.  Neck: Normal range of motion. Neck supple. No JVD present. Carotid bruit is not present. No thyromegaly present.  Cardiovascular: Normal rate, regular rhythm, normal heart sounds and intact distal pulses.  Exam reveals no gallop.   Pulmonary/Chest: Effort normal and breath sounds normal. No respiratory distress. She has no wheezes. She has no rales.  No crackles  Abdominal: Soft. Bowel sounds are normal. She exhibits no distension, no abdominal bruit and no mass. There is no tenderness.  Musculoskeletal: She exhibits no edema.  Lymphadenopathy:    She has no cervical adenopathy.  Neurological: She is alert. She has normal reflexes. No cranial nerve deficit. She exhibits normal muscle tone. Coordination  normal.  Skin: Skin is warm and dry. No rash noted.  Psychiatric: She has a normal mood and affect.          Assessment & Plan:   Problem List Items Addressed This Visit      Cardiovascular and Mediastinum   Essential hypertension - Primary    bp in fair control at this time  BP Readings from Last 1 Encounters:  12/16/16 128/60   No changes needed Disc lifstyle change with low sodium diet and exercise  Labs reviewed F/u 3 mo        Endocrine   Diabetes type 2, uncontrolled ([place])   Relevant Medications   sitaGLIPtin (JANUVIA) 100 MG tablet     Other   Hyperlipidemia LDL goal <100

## 2016-12-16 NOTE — Assessment & Plan Note (Signed)
Lab Results  Component Value Date   HGBA1C 7.5 (H) 12/05/2016   This is up  Inc januvia to 100 mg daily  Check glucose bid and update  Diet and exercise disc Cut out sweets/ice cream  Wt loss end

## 2016-12-16 NOTE — Assessment & Plan Note (Signed)
Discussed how this problem influences overall health and the risks it imposes  Reviewed plan for weight loss with lower calorie diet (via better food choices and also portion control or program like weight watchers) and exercise building up to or more than 30 minutes 5 days per week including some aerobic activity    

## 2016-12-16 NOTE — Assessment & Plan Note (Signed)
bp in fair control at this time  BP Readings from Last 1 Encounters:  12/16/16 128/60   No changes needed Disc lifstyle change with low sodium diet and exercise  Labs reviewed F/u 3 mo

## 2016-12-29 ENCOUNTER — Encounter: Payer: Self-pay | Admitting: Adult Health

## 2016-12-29 ENCOUNTER — Ambulatory Visit (INDEPENDENT_AMBULATORY_CARE_PROVIDER_SITE_OTHER): Payer: Medicare Other | Admitting: Adult Health

## 2016-12-29 DIAGNOSIS — G47411 Narcolepsy with cataplexy: Secondary | ICD-10-CM | POA: Diagnosis not present

## 2016-12-29 MED ORDER — METHYLPHENIDATE HCL 20 MG PO TABS: 20.0000 mg | ORAL_TABLET | Freq: Three times a day (TID) | ORAL | 0 refills | Status: DC

## 2016-12-29 MED ORDER — IMIPRAMINE HCL 50 MG PO TABS: 50.0000 mg | ORAL_TABLET | Freq: Every day | ORAL | 1 refills | Status: DC

## 2016-12-29 NOTE — Progress Notes (Signed)
I agree with the assessment and plan as directed by NP .The patient is known to me .   Yvanna Vidas, MD  

## 2016-12-29 NOTE — Patient Instructions (Signed)
Your Plan:  Continue Ritalin and Tofranil  Monitor Heartrate  If your symptoms worsen or you develop new symptoms please let us know.   [facility] you [facility]. I hope we have been able to provide you high quality care today.  You may receive a patient satisfaction survey over the next few weeks. We would appreciate your feedback and comments so that we may continue to improve ourselves and the health of our patients.

## 2016-12-29 NOTE — Progress Notes (Signed)
PATIENT: Amanda King DOB: 1941-06-02  REASON FOR VISIT: follow up- narcolepsy HISTORY FROM: patient  HISTORY OF PRESENT ILLNESS: Today 12/29/16  Amanda King is a 76 year old female with a history of narcolepsy. She returns today for follow-up. She is currently on Ritalin and imipramine. She states that she always takes Ritalin in the morning and occasionally will take it again later in the day. She states that she rarely takes it 3 times a day as ordered. She always takes imipramine at bedtime. She states that these medications work very well. She denies any significant sleepiness. She is able to work 2- 3 days a week. She drives without difficulty. She returns today for an evaluation.  HISTORY  12/24/2015: Amanda King reports today that she has done very well with her current medical regimen and treatment of narcolepsy. She endorsed 7 points on the Epworth sleepiness score, 9 points on the fatigue severity score and 0 on the geriatric depression score. She has decided that she cannot retire and will work 3 times a week and this keeps her active and happy. She also just told me that her granddaughter's are 71 and 69 years old and that she has already  2 great-grandchildren 67 years old !   REVIEW OF SYSTEMS: Out of a complete 14 system review of symptoms, the patient complains only of the following symptoms, and all other reviewed systems are negative.  See history of present illness, Epworth Sleepiness Scale 5 fatigue severity scale 9  ALLERGIES: Allergies  Allergen Reactions  . Penicillins     REACTION: GI upset    HOME MEDICATIONS: Outpatient Medications Prior to Visit  Medication Sig Dispense Refill  . aspirin 81 MG tablet Take 81 mg by mouth daily.      . Blood Glucose Monitoring Suppl (BLOOD GLUCOSE MONITOR KIT) KIT Check blood sugar once daily as needed for DM     . cetirizine (ZYRTEC) 10 MG tablet Take 10 mg by mouth daily.    Marland Kitchen glipiZIDE (GLUCOTROL XL) 10 MG 24 hr  tablet TAKE 1 TABLET BY MOUTH ONCE DAILY WITH BREAKFAST 90 tablet 0  . glucose blood test strip Check blood sugar once daily as needed for DM.     Marland Kitchen guaiFENesin-codeine (ROBITUSSIN AC) 100-10 MG/5ML syrup Take 5 mLs by mouth 4 (four) times daily as needed for cough. 120 mL 0  . hydrochlorothiazide (HYDRODIURIL) 25 MG tablet TAKE ONE TABLET BY MOUTH ONCE DAILY 90 tablet 1  . ibuprofen (ADVIL,MOTRIN) 200 MG tablet Take 200 mg by mouth every 6 (six) hours as needed.      Marland Kitchen imipramine (TOFRANIL) 50 MG tablet Take 1 tablet (50 mg total) by mouth at bedtime. 90 tablet 1  . levothyroxine (SYNTHROID, LEVOTHROID) 88 MCG tablet Take 1 tablet (88 mcg total) by mouth daily. 30 tablet 11  . lisinopril (PRINIVIL,ZESTRIL) 20 MG tablet TAKE 1 TABLET BY MOUTH ONCE DAILY 90 tablet 1  . metFORMIN (GLUCOPHAGE-XR) 750 MG 24 hr tablet TAKE ONE TABLET BY MOUTH TWICE DAILY 180 tablet 1  . methylphenidate (RITALIN) 20 MG tablet Take 1 tablet (20 mg total) by mouth 3 (three) times daily with meals. 180 tablet 0  . simvastatin (ZOCOR) 80 MG tablet TAKE 1 TABLET BY MOUTH AT BEDTIME 90 tablet 0  . sitaGLIPtin (JANUVIA) 100 MG tablet Take 1 tablet (100 mg total) by mouth daily. 30 tablet 11  . zoster vaccine live, PF, (ZOSTAVAX) 83382 UNT/0.65ML injection Inject 19,400 Units into the skin once. 1  vial 0   No facility-administered medications prior to visit.     PAST MEDICAL HISTORY: Past Medical History:  Diagnosis Date  . Diabetes mellitus    Type II  . Hyperlipidemia   . Hypertension   . Hypothyroidism   . Migraine   . Narcolepsy with cataplexy   . Pneumonia 08/2001    PAST SURGICAL HISTORY: Past Surgical History:  Procedure Laterality Date  . APPENDECTOMY  age 83  . hemilaminectomy     C5-6; C6-7  . PARTIAL HYSTERECTOMY  age 62  . POSTERIOR CERVICAL FUSION/FORAMINOTOMY  02/05/1975   C5-C6-C7    FAMILY HISTORY: Family History  Problem Relation Age of Onset  . Depression Brother        suicide  .  Diabetes Brother   . Heart disease Mother        MI  . Peripheral vascular disease Mother   . Hyperlipidemia Mother   . Diabetes Brother   . Sleep apnea Son   . Sleep apnea Brother     SOCIAL HISTORY: Social History   Social History  . Marital status: Widowed    Spouse name: N/A  . Number of children: 3  . Years of education: GED   Occupational History  .  [employer]   Social History Main Topics  . Smoking status: Former Smoker    Quit date: 06/09/1980  . Smokeless tobacco: Never Used  . Alcohol use No  . Drug use: No  . Sexual activity: No   Other Topics Concern  . Not on file   Social History Narrative   Patient is widowed. and her son lives with her.   Walks for exercise.     Patient has three children.   Patient is right-handed.   Patient drinks three cups of caffeine daily.     Patient is retired.   Patient has a GED.      PHYSICAL EXAM  Vitals:   12/29/16 0935 12/29/16 0948  BP: 131/72   Pulse: (!) 103 96  Weight: 158 lb 3.2 oz (71.8 kg)    Body mass index is 29.65 kg/m.  Generalized: Well developed, in no acute distress   Neurological examination  Mentation: Alert oriented to time, place, history taking. Follows all commands speech and language fluent Cranial nerve II-XII: Pupils were equal round reactive to light. Extraocular movements were full, visual field were full on confrontational test. Facial sensation and strength were normal. Uvula tongue midline. Head turning and shoulder shrug  were normal and symmetric. Motor: The motor testing reveals 5 over 5 strength of all 4 extremities. Good symmetric motor tone is noted throughout.  Sensory: Sensory testing is intact to soft touch on all 4 extremities. No evidence of extinction is noted.  Coordination: Cerebellar testing reveals good finger-nose-finger and heel-to-shin bilaterally.  Gait and station: Gait is normal. Tandem gait is normal. Romberg is Slightly unsteady but  negative. Reflexes: Deep tendon reflexes are symmetric and normal bilaterally.   DIAGNOSTIC DATA (LABS, IMAGING, TESTING) - I reviewed patient records, labs, notes, testing and imaging myself where available.  Lab Results  Component Value Date   WBC 7.5 02/05/2016   HGB 12.4 02/05/2016   HCT 36.0 02/05/2016   MCV 87.9 02/05/2016   PLT 251.0 02/05/2016      Component Value Date/Time   NA 137 12/05/2016 0908   K 3.9 12/05/2016 0908   CL 100 12/05/2016 0908   CO2 29 12/05/2016 0908   GLUCOSE 170 (H) 12/05/2016 0908  BUN 29 (H) 12/05/2016 0908   CREATININE 1.18 12/05/2016 0908   CALCIUM 9.3 12/05/2016 0908   PROT 7.3 12/05/2016 0908   ALBUMIN 4.3 12/05/2016 0908   AST 16 12/05/2016 0908   ALT 20 12/05/2016 0908   ALKPHOS 85 12/05/2016 0908   BILITOT 0.3 12/05/2016 0908   GFRNONAA 70.16 06/06/2010 0954   GFRAA 92 03/02/2008 1037   Lab Results  Component Value Date   CHOL 208 (H) 12/05/2016   HDL 41.10 12/05/2016   LDLCALC 73 06/21/2014   LDLDIRECT 99.0 12/05/2016   TRIG 368.0 (H) 12/05/2016   CHOLHDL 5 12/05/2016   Lab Results  Component Value Date   HGBA1C 7.5 (H) 12/05/2016   No results found for: [id number] Lab Results  Component Value Date   TSH 2.04 08/13/2016      ASSESSMENT AND PLAN 76 y.o. year old female  has a past medical history of Diabetes mellitus; Hyperlipidemia; Hypertension; Hypothyroidism; Migraine; Narcolepsy with cataplexy; and Pneumonia (08/2001). here with:  1. Narcolepsy  Overall the patient is doing well. She will continue Ritalin and imipramine. She should continue to monitor her heart rate and blood pressure. Patient advised that if her symptoms worsen or she develops new symptoms she should let us know. She will follow-up in 6 months with Dr. Brett Fairy.  I spent 15 minutes with the patient. 50% of this time was spent discussing medication and side effects.    Ward Givens, MSN, NP-C 12/29/2016, 9:21 AM [facility] [address], [place], [place] [address] [phone]

## 2017-02-16 ENCOUNTER — Other Ambulatory Visit: Payer: Self-pay | Admitting: Family Medicine

## 2017-03-04 ENCOUNTER — Other Ambulatory Visit: Payer: Self-pay | Admitting: Family Medicine

## 2017-03-11 ENCOUNTER — Other Ambulatory Visit: Payer: Medicare Other

## 2017-03-18 ENCOUNTER — Ambulatory Visit: Payer: Medicare Other | Admitting: Family Medicine

## 2017-03-20 DIAGNOSIS — H524 Presbyopia: Secondary | ICD-10-CM | POA: Diagnosis not present

## 2017-03-20 LAB — HM DIABETES EYE EXAM

## 2017-05-03 ENCOUNTER — Other Ambulatory Visit: Payer: Self-pay | Admitting: Family Medicine

## 2017-05-13 ENCOUNTER — Other Ambulatory Visit: Payer: Self-pay | Admitting: Family Medicine

## 2017-06-04 ENCOUNTER — Other Ambulatory Visit: Payer: Self-pay | Admitting: Family Medicine

## 2017-07-01 ENCOUNTER — Other Ambulatory Visit: Payer: Self-pay | Admitting: Adult Health

## 2017-07-01 ENCOUNTER — Ambulatory Visit: Payer: Medicare Other | Admitting: Neurology

## 2017-07-01 DIAGNOSIS — G47411 Narcolepsy with cataplexy: Secondary | ICD-10-CM

## 2017-07-03 ENCOUNTER — Encounter: Payer: Self-pay | Admitting: Neurology

## 2017-07-20 ENCOUNTER — Other Ambulatory Visit: Payer: Self-pay

## 2017-07-20 ENCOUNTER — Ambulatory Visit: Payer: Medicare Other | Admitting: Physician Assistant

## 2017-07-20 VITALS — BP 148/60 | HR 106 | Temp 99.8°F | Resp 16 | Ht 61.0 in | Wt 163.0 lb

## 2017-07-20 DIAGNOSIS — W57XXXA Bitten or stung by nonvenomous insect and other nonvenomous arthropods, initial encounter: Secondary | ICD-10-CM | POA: Diagnosis not present

## 2017-07-20 DIAGNOSIS — J988 Other specified respiratory disorders: Secondary | ICD-10-CM

## 2017-07-20 DIAGNOSIS — R05 Cough: Secondary | ICD-10-CM

## 2017-07-20 DIAGNOSIS — R059 Cough, unspecified: Secondary | ICD-10-CM

## 2017-07-20 LAB — POCT CBC
GRANULOCYTE PERCENT: 75.5 % (ref 37–80)
HEMATOCRIT: 38.9 % (ref 37.7–47.9)
Hemoglobin: 12.7 g/dL (ref 12.2–16.2)
LYMPH, POC: 1.5 (ref 0.6–3.4)
MCH, POC: 29.6 pg (ref 27–31.2)
MCHC: 32.7 g/dL (ref 31.8–35.4)
MCV: 90.5 fL (ref 80–97)
MID (cbc): 0.6 (ref 0–0.9)
MPV: 7.3 fL (ref 0–99.8)
POC GRANULOCYTE: 6.3 (ref 2–6.9)
POC LYMPH %: 17.4 % (ref 10–50)
POC MID %: 7.1 % (ref 0–12)
Platelet Count, POC: 242 10*3/uL (ref 142–424)
RBC: 4.3 M/uL (ref 4.04–5.48)
RDW, POC: 13.5 %
WBC: 8.4 10*3/uL (ref 4.6–10.2)

## 2017-07-20 LAB — POC INFLUENZA A&B (BINAX/QUICKVUE)
INFLUENZA A, POC: NEGATIVE
Influenza B, POC: NEGATIVE

## 2017-07-20 MED ORDER — GUAIFENESIN ER 1200 MG PO TB12
1.0000 | ORAL_TABLET | Freq: Two times a day (BID) | ORAL | 1 refills | Status: DC | PRN
Start: 2017-07-20 — End: 2017-10-06

## 2017-07-20 MED ORDER — BENZONATATE 100 MG PO CAPS: 100.0000 mg | ORAL_CAPSULE | Freq: Three times a day (TID) | ORAL | 0 refills | Status: DC | PRN

## 2017-07-20 NOTE — Patient Instructions (Addendum)
I am obtaining bloodwork for your symptoms, and will follow up with your results.   Please take tylenol or ibuprofen for pain and fever. Please make sure that you are hydrating well with 64 oz of water if not more.   Viral Respiratory Infection A viral respiratory infection is an illness that affects parts of the body used for breathing, like the lungs, nose, and throat. It is caused by a germ called a virus. Some examples of this kind of infection are:  A cold.  The flu (influenza).  A respiratory syncytial virus (RSV) infection.  How do I know if I have this infection? Most of the time this infection causes:  A stuffy or runny nose.  Yellow or green fluid in the nose.  A cough.  Sneezing.  Tiredness (fatigue).  Achy muscles.  A sore throat.  Sweating or chills.  A fever.  A headache.  How is this infection treated? If the flu is diagnosed early, it may be treated with an antiviral medicine. This medicine shortens the length of time a person has symptoms. Symptoms may be treated with over-the-counter and prescription medicines, such as:  Expectorants. These make it easier to cough up mucus.  Decongestant nasal sprays.  Doctors do not prescribe antibiotic medicines for viral infections. They do not work with this kind of infection. How do I know if I should stay home? To keep others from getting sick, stay home if you have:  A fever.  A lasting cough.  A sore throat.  A runny nose.  Sneezing.  Muscles aches.  Headaches.  Tiredness.  Weakness.  Chills.  Sweating.  An upset stomach (nausea).  Follow these instructions at home:  Rest as much as possible.  Take over-the-counter and prescription medicines only as told by your doctor.  Drink enough fluid to keep your pee (urine) clear or pale yellow.  Gargle with salt water. Do this 3-4 times per day or as needed. To make a salt-water mixture, dissolve -1 tsp of salt in 1 cup of warm water.  Make sure the salt dissolves all the way.  Use nose drops made from salt water. This helps with stuffiness (congestion). It also helps soften the skin around your nose.  Do not drink alcohol.  Do not use tobacco products, including cigarettes, chewing tobacco, and e-cigarettes. If you need help quitting, ask your doctor. Get help if:  Your symptoms last for 10 days or longer.  Your symptoms get worse over time.  You have a fever.  You have very bad pain in your face or forehead.  Parts of your jaw or neck become very swollen. Get help right away if:  You feel pain or pressure in your chest.  You have shortness of breath.  You faint or feel like you will faint.  You keep throwing up (vomiting).  You feel confused. This information is not intended to replace advice given to you by your health care provider. Make sure you discuss any questions you have with your health care provider. Document Released: 05/08/2008 Document Revised: 11/01/2015 Document Reviewed: 11/01/2014 Elsevier Interactive Patient Education  2018 [employer]    IF you received an x-ray today, you will receive an invoice from [facility]. Please contact [facility] at [phone] with questions or concerns regarding your invoice.   IF you received labwork today, you will receive an invoice from [facility]. Please contact [facility] at [phone] with questions or concerns regarding your invoice.   Our billing staff will  not be able to assist you with questions regarding bills from these companies.  You will be contacted with the lab results as soon as they are available. The fastest way to get your results is to activate your My Chart account. Instructions are located on the last page of this paperwork. If you have not heard from [place] regarding the results in 2 weeks, please contact this office.

## 2017-07-20 NOTE — Progress Notes (Signed)
PRIMARY CARE AT [place], [place] [address] [phone]  Date:  07/20/2017   Name:  Amanda King   DOB:  Aug 15, 1940   MRN:  [id number]  PCP:  Abner Greenspan, MD    History of Present Illness:  Amanda King is a 77 y.o. female patient who presents to PCP with  Chief Complaint  Patient presents with  . Cough    x 3 days  . Tick Removal    pt removed a tick from her stomach saturday morining, pt thinks she was bit     Cough 2 days ago, which resolved some over the next 24 hours, but then it reappeared that night.  She has fatigue, and weak.  Cough is non-productive with dry throat.   She has head pain and nasal congestion.  Sinus pressure and pain.  She has some ringing of her ears.  She does have bodyaches.   She reports that after showering, and found what she thought was a tick.  She used etOH, and neosporin.  No bull's eye sign.  [place] does walk outdoors, [place]  She does have a dog, which she checks often.  No neck stiffness  Patient Active Problem List   Diagnosis Date Noted  . Routine general medical examination at a health care facility 01/31/2016  . Narcolepsy with cataplexy 10/31/2014  . Stress reaction 03/28/2014  . Encounter for Medicare annual wellness exam 09/22/2013  . Hypersomnia, persistent 07/15/2013  . Other screening mammogram 12/31/2011  . Colon cancer screening 12/31/2011  . Overweight (BMI 25.0-29.9) 07/01/2011  . ANXIETY 05/06/2010  . DERMATOPHYTOSIS OF NAIL 12/21/2008  . Hypothyroidism 09/02/2006  . Diabetes type 2, uncontrolled ([place]) 09/02/2006  . Hyperlipidemia LDL goal <100 09/02/2006  . NARCOLEPSY W/CATAPLEXY 09/02/2006  . Essential hypertension 09/02/2006  . PNEUMONIA, HX OF 09/02/2006    Past Medical History:  Diagnosis Date  . Diabetes mellitus    Type II  . Hyperlipidemia   . Hypertension   . Hypothyroidism   . Migraine   . Narcolepsy with cataplexy   . Pneumonia 08/2001    Past Surgical History:   Procedure Laterality Date  . APPENDECTOMY  age 5  . hemilaminectomy     C5-6; C6-7  . PARTIAL HYSTERECTOMY  age 69  . POSTERIOR CERVICAL FUSION/FORAMINOTOMY  02/05/1975   C5-C6-C7    Social History   Tobacco Use  . Smoking status: Former Smoker    Last attempt to quit: 06/09/1980    Years since quitting: 37.1  . Smokeless tobacco: Never Used  Substance Use Topics  . Alcohol use: No    Alcohol/week: 0.0 oz  . Drug use: No    Family History  Problem Relation Age of Onset  . Depression Brother        suicide  . Diabetes Brother   . Heart disease Mother        MI  . Peripheral vascular disease Mother   . Hyperlipidemia Mother   . Diabetes Brother   . Sleep apnea Son   . Sleep apnea Brother     Allergies  Allergen Reactions  . Penicillins     REACTION: GI upset    Medication list has been reviewed and updated.  Current Outpatient Medications on File Prior to Visit  Medication Sig Dispense Refill  . aspirin 81 MG tablet Take 81 mg by mouth daily.      . Blood Glucose Monitoring Suppl (BLOOD GLUCOSE MONITOR  KIT) KIT Check blood sugar once daily as needed for DM     . cetirizine (ZYRTEC) 10 MG tablet Take 10 mg by mouth daily.    Marland Kitchen GLIPIZIDE XL 10 MG 24 hr tablet TAKE 1 TABLET BY MOUTH ONCE DAILY WITH BREAKFAST 90 tablet 0  . glucose blood test strip Check blood sugar once daily as needed for DM.     Marland Kitchen guaiFENesin-codeine (ROBITUSSIN AC) 100-10 MG/5ML syrup Take 5 mLs by mouth 4 (four) times daily as needed for cough. 120 mL 0  . hydrochlorothiazide (HYDRODIURIL) 25 MG tablet TAKE 1 TABLET BY MOUTH ONCE DAILY 90 tablet 1  . ibuprofen (ADVIL,MOTRIN) 200 MG tablet Take 200 mg by mouth every 6 (six) hours as needed.      Marland Kitchen imipramine (TOFRANIL) 50 MG tablet TAKE 1 TABLET BY MOUTH AT BEDTIME 90 tablet 1  . levothyroxine (SYNTHROID, LEVOTHROID) 88 MCG tablet TAKE ONE TABLET BY MOUTH ONCE DAILY 90 tablet 0  . lisinopril (PRINIVIL,ZESTRIL) 20 MG tablet TAKE 1 TABLET BY MOUTH  ONCE DAILY 90 tablet 0  . metFORMIN (GLUCOPHAGE-XR) 750 MG 24 hr tablet TAKE ONE TABLET BY MOUTH TWICE DAILY 180 tablet 1  . methylphenidate (RITALIN) 20 MG tablet Take 1 tablet (20 mg total) by mouth 3 (three) times daily with meals. 180 tablet 0  . simvastatin (ZOCOR) 80 MG tablet TAKE 1 TABLET BY MOUTH AT BEDTIME 90 tablet 0  . sitaGLIPtin (JANUVIA) 100 MG tablet Take 1 tablet (100 mg total) by mouth daily. 30 tablet 11  . zoster vaccine live, PF, (ZOSTAVAX) 21975 UNT/0.65ML injection Inject 19,400 Units into the skin once. 1 vial 0   No current facility-administered medications on file prior to visit.     ROS ROS otherwise unremarkable unless listed above.  Physical Examination: BP (!) 148/60   Pulse (!) 112   Temp 99.8 F (37.7 C) (Oral)   Resp 16   Ht '5\' 1"'  (1.549 m)   Wt 163 lb (73.9 kg)   SpO2 98%   BMI 30.80 kg/m  Ideal Body Weight: Weight in (lb) to have BMI = 25: 132  Physical Exam  Constitutional: She is oriented to person, place, and time. She appears well-developed and well-nourished. No distress.  HENT:  Head: Normocephalic and atraumatic.  Right Ear: Tympanic membrane, external ear and ear canal normal.  Left Ear: Tympanic membrane, external ear and ear canal normal.  Nose: Mucosal edema and rhinorrhea present. Right sinus exhibits no maxillary sinus tenderness and no frontal sinus tenderness. Left sinus exhibits no maxillary sinus tenderness and no frontal sinus tenderness.  Mouth/Throat: No uvula swelling. No oropharyngeal exudate, posterior oropharyngeal edema or posterior oropharyngeal erythema.  Eyes: Conjunctivae and EOM are normal. Pupils are equal, round, and reactive to light.  Cardiovascular: Normal rate and regular rhythm. Exam reveals no gallop, no distant heart sounds and no friction rub.  No murmur heard. Pulmonary/Chest: Effort normal. No respiratory distress. She has no decreased breath sounds. She has no wheezes. She has no rhonchi.  Abdominal:   Lower abdomen near the beltline.  With erythematous .5cm patch.    Lymphadenopathy:       Head (right side): No submandibular, no tonsillar, no preauricular and no posterior auricular adenopathy present.       Head (left side): No submandibular, no tonsillar, no preauricular and no posterior auricular adenopathy present.  Neurological: She is alert and oriented to person, place, and time.  Skin: She is not diaphoretic.  Psychiatric: She has a  normal mood and affect. Her behavior is normal.    Results for orders placed or performed in visit on 07/20/17  POC Influenza A&B(BINAX/QUICKVUE)  Result Value Ref Range   Influenza A, POC Negative Negative   Influenza B, POC Negative Negative  POCT CBC  Result Value Ref Range   WBC 8.4 4.6 - 10.2 K/uL   Lymph, poc 1.5 0.6 - 3.4   POC LYMPH PERCENT 17.4 10 - 50 %L   MID (cbc) 0.6 0 - 0.9   POC MID % 7.1 0 - 12 %M   POC Granulocyte 6.3 2 - 6.9   Granulocyte percent 75.5 37 - 80 %G   RBC 4.30 4.04 - 5.48 M/uL   Hemoglobin 12.7 12.2 - 16.2 g/dL   HCT, POC 38.9 37.7 - 47.9 %   MCV 90.5 80 - 97 fL   MCH, POC 29.6 27 - 31.2 pg   MCHC 32.7 31.8 - 35.4 g/dL   RDW, POC 13.5 %   Platelet Count, POC 242 142 - 424 K/uL   MPV 7.3 0 - 99.8 fL     Assessment and Plan: LISSA ROWLES is a 77 y.o. female who is here today for cc of  Chief Complaint  Patient presents with  . Cough    x 3 days  . Tick Removal    pt removed a tick from her stomach saturday morining, pt thinks she was bit  very likely with upper respiratory symptoms, unlikely lyme and tick-borne disease.  Will treat supportively for viral UR illness.  Obtaining labs today.   Respiratory infection - Plan: Sedimentation Rate  Cough - Plan: POC Influenza A&B(BINAX/QUICKVUE), Lyme Disease, Western Blot, POCT CBC, CANCELED: CBC  Tick bite, initial encounter - Plan: POC Influenza A&B(BINAX/QUICKVUE), Lyme Disease, Western Blot, POCT CBC, CANCELED: CBC  Ivar Drape, PA-C [facility] 2/12/201910:45 AM

## 2017-07-21 ENCOUNTER — Encounter: Payer: Self-pay | Admitting: Physician Assistant

## 2017-07-21 LAB — SEDIMENTATION RATE: SED RATE: 16 mm/h (ref 0–40)

## 2017-07-22 ENCOUNTER — Ambulatory Visit: Payer: Medicare Other | Admitting: Physician Assistant

## 2017-07-23 LAB — LYME DISEASE, WESTERN BLOT
IGG P41 AB.: ABSENT
IGG P45 AB.: ABSENT
IGG P58 AB.: ABSENT
IgG P18 Ab.: ABSENT
IgG P23 Ab.: ABSENT
IgG P28 Ab.: ABSENT
IgG P30 Ab.: ABSENT
IgG P39 Ab.: ABSENT
IgG P66 Ab.: ABSENT
IgG P93 Ab.: ABSENT
IgM P23 Ab.: ABSENT
IgM P39 Ab.: ABSENT
IgM P41 Ab.: ABSENT
LYME IGM WB: NEGATIVE
Lyme IgG Wb: NEGATIVE

## 2017-07-27 ENCOUNTER — Telehealth: Payer: Self-pay | Admitting: Family Medicine

## 2017-07-27 NOTE — Telephone Encounter (Signed)
Pt given results per notes of Trena PlattStephanie English PA  on 07/24/17 Unable to document in result note due to result note not being routed to [facility].

## 2017-07-27 NOTE — Telephone Encounter (Signed)
Pt requesting refill of [facility]

## 2017-07-28 ENCOUNTER — Other Ambulatory Visit: Payer: Self-pay | Admitting: Family Medicine

## 2017-08-06 ENCOUNTER — Other Ambulatory Visit: Payer: Self-pay | Admitting: Family Medicine

## 2017-08-06 NOTE — Telephone Encounter (Signed)
Last office visit 12/16/2016.  AVS states to follow up in 3 months.  No future appointments.  Refill?

## 2017-08-06 NOTE — Telephone Encounter (Signed)
Please schedule f/u and refill until then  

## 2017-08-07 NOTE — Telephone Encounter (Signed)
Left VM requesting pt to call and schedule f/u appt before we can refill med  CRM created

## 2017-08-10 NOTE — Telephone Encounter (Signed)
Called and left message to make OV to get meds refilled

## 2017-08-11 ENCOUNTER — Encounter: Payer: Self-pay | Admitting: Neurology

## 2017-08-11 ENCOUNTER — Telehealth: Payer: Self-pay | Admitting: *Deleted

## 2017-08-11 NOTE — Telephone Encounter (Signed)
Called to let her know that the letter is ready and will be at the front for pick up. Pt verbalized understanding.

## 2017-08-11 NOTE — Telephone Encounter (Signed)
Patient stopped by the office.  She got a jury summons and needs a letter stating she cant serve.   [place].  Needs as soon as possible.  Please call when ready.

## 2017-08-17 ENCOUNTER — Other Ambulatory Visit: Payer: Self-pay | Admitting: Family Medicine

## 2017-08-17 MED ORDER — GLIPIZIDE ER 10 MG PO TB24: ORAL_TABLET | ORAL | 1 refills | Status: DC

## 2017-08-17 MED ORDER — SIMVASTATIN 80 MG PO TABS: 80.0000 mg | ORAL_TABLET | Freq: Every day | ORAL | 1 refills | Status: DC

## 2017-08-17 NOTE — Telephone Encounter (Signed)
Last office visit 12/16/16 PCP: Dr. Milinda Antisower [facility]:  [facility] [place]

## 2017-08-17 NOTE — Telephone Encounter (Signed)
Copied from CRM [id number]. Topic: General - Other >> Aug 17, 2017  8:26 AM Cecelia ByarsGreen, Temeka L, RMA wrote: Reason for CRM: Medication refill request for simvastatin (ZOCOR) 80 MG and GLIPIZIDE XL 10 MG 24 hr tablet to [facility] [place], pt is completely out of medication

## 2017-08-18 ENCOUNTER — Other Ambulatory Visit (INDEPENDENT_AMBULATORY_CARE_PROVIDER_SITE_OTHER): Payer: Medicare Other

## 2017-08-18 DIAGNOSIS — E1165 Type 2 diabetes mellitus with hyperglycemia: Secondary | ICD-10-CM

## 2017-08-18 DIAGNOSIS — E785 Hyperlipidemia, unspecified: Secondary | ICD-10-CM

## 2017-08-18 DIAGNOSIS — IMO0001 Reserved for inherently not codable concepts without codable children: Secondary | ICD-10-CM

## 2017-08-18 LAB — LIPID PANEL
CHOL/HDL RATIO: 5
Cholesterol: 199 mg/dL (ref 0–200)
HDL: 42 mg/dL (ref >39.00)
NONHDL: 157.35
Triglycerides: 366 mg/dL — ABNORMAL HIGH (ref 0.0–149.0)
VLDL: 73.2 mg/dL — AB (ref 0.0–40.0)

## 2017-08-18 LAB — BASIC METABOLIC PANEL
BUN: 26 mg/dL — ABNORMAL HIGH (ref 6–23)
CO2: 31 meq/L (ref 19–32)
Calcium: 9.4 mg/dL (ref 8.4–10.5)
Chloride: 97 mEq/L (ref 96–112)
Creatinine, Ser: 0.99 mg/dL (ref 0.40–1.20)
GFR: 57.69 mL/min — AB (ref >60.00)
Glucose, Bld: 169 mg/dL — ABNORMAL HIGH (ref 70–99)
Potassium: 3.9 mEq/L (ref 3.5–5.1)
SODIUM: 136 meq/L (ref 135–145)

## 2017-08-18 LAB — LDL CHOLESTEROL, DIRECT: LDL DIRECT: 87 mg/dL

## 2017-08-18 LAB — HEMOGLOBIN A1C: HEMOGLOBIN A1C: 7.4 % — AB (ref 4.6–6.5)

## 2017-08-24 ENCOUNTER — Ambulatory Visit: Payer: Medicare Other | Admitting: Family Medicine

## 2017-08-24 ENCOUNTER — Encounter: Payer: Self-pay | Admitting: Family Medicine

## 2017-08-24 VITALS — BP 128/74 | HR 98 | Temp 98.0°F | Ht 61.0 in | Wt 161.5 lb

## 2017-08-24 DIAGNOSIS — E785 Hyperlipidemia, unspecified: Secondary | ICD-10-CM | POA: Diagnosis not present

## 2017-08-24 DIAGNOSIS — I10 Essential (primary) hypertension: Secondary | ICD-10-CM

## 2017-08-24 DIAGNOSIS — E669 Obesity, unspecified: Secondary | ICD-10-CM | POA: Diagnosis not present

## 2017-08-24 DIAGNOSIS — E1165 Type 2 diabetes mellitus with hyperglycemia: Secondary | ICD-10-CM

## 2017-08-24 MED ORDER — METFORMIN HCL ER 750 MG PO TB24: 750.0000 mg | ORAL_TABLET | Freq: Every day | ORAL | 1 refills | Status: DC

## 2017-08-24 NOTE — Assessment & Plan Note (Signed)
Disc goals for lipids and reasons to control them Rev labs with pt Rev low sat fat diet in detail LDL is 73- will continue to work on diet  Max dose of simvastatin

## 2017-08-24 NOTE — Assessment & Plan Note (Signed)
Discussed how this problem influences overall health and the risks it imposes  Reviewed plan for weight loss with lower calorie diet (via better food choices and also portion control or program like weight watchers) and exercise building up to or more than 30 minutes 5 days per week including some aerobic activity    

## 2017-08-24 NOTE — Progress Notes (Signed)
Subjective:    Patient ID: Amanda King, female    DOB: Oct 31, 1940, 77 y.o.   MRN: [id number]  HPI Here for f/u of chronic health problems   Has been sick  She went to UC for uri symptoms  diag with sinus infection  Still has a cough and has not felt great  It had been a month  Drinking more water    Family member com suicide -that was hard    Wt Readings from Last 3 Encounters:  08/24/17 161 lb 8 oz (73.3 kg)  07/20/17 163 lb (73.9 kg)  12/29/16 158 lb 3.2 oz (71.8 kg)  now in obese range  30.52 kg/m   bp is stable today  No cp or palpitations or headaches or edema  No side effects to medicines  BP Readings from Last 3 Encounters:  08/24/17 128/74  07/20/17 (!) 148/60  12/29/16 131/72     DM2 Lab Results  Component Value Date   HGBA1C 7.4 (H) 08/18/2017  this is fairly stable from 7.5 Glucose readings On metformin xr and glipizide and januvia [place] went way up in cost)- and then went down again  Metformin gives her diarrhea - but she tolerates it once daily bid)  Eye exam - last month- [facility]  On ace for renal protection  Diet- is better /working on it with her son -eating less sweets  Out in the yard getting exercise more / and goes for walks     Hyperlipidemia Lab Results  Component Value Date   CHOL 199 08/18/2017   CHOL 208 (H) 12/05/2016   CHOL 186 05/13/2016   Lab Results  Component Value Date   HDL 42.00 08/18/2017   HDL 41.10 12/05/2016   HDL 41.10 05/13/2016   Lab Results  Component Value Date   LDLCALC 73 06/21/2014   LDLCALC 49 09/14/2013   LDLCALC 51 12/31/2011   Lab Results  Component Value Date   TRIG 366.0 (H) 08/18/2017   TRIG 368.0 (H) 12/05/2016   TRIG 310.0 (H) 05/13/2016   Lab Results  Component Value Date   CHOLHDL 5 08/18/2017   CHOLHDL 5 12/05/2016   CHOLHDL 5 05/13/2016   Lab Results  Component Value Date   LDLDIRECT 87.0 08/18/2017   LDLDIRECT 99.0 12/05/2016   LDLDIRECT 87.0 05/13/2016   On  simvastatin 80 mg daily  Watches her diet  Trig are high because sugar is high    Patient Active Problem List   Diagnosis Date Noted  . Routine general medical examination at a health care facility 01/31/2016  . Narcolepsy with cataplexy 10/31/2014  . Stress reaction 03/28/2014  . Encounter for Medicare annual wellness exam 09/22/2013  . Hypersomnia, persistent 07/15/2013  . Other screening mammogram 12/31/2011  . Colon cancer screening 12/31/2011  . Obesity (BMI 30-39.9) 07/01/2011  . ANXIETY 05/06/2010  . DERMATOPHYTOSIS OF NAIL 12/21/2008  . Hypothyroidism 09/02/2006  . Diabetes type 2, uncontrolled ([place]) 09/02/2006  . Hyperlipidemia LDL goal <100 09/02/2006  . NARCOLEPSY W/CATAPLEXY 09/02/2006  . Essential hypertension 09/02/2006  . PNEUMONIA, HX OF 09/02/2006   Past Medical History:  Diagnosis Date  . Diabetes mellitus    Type II  . Hyperlipidemia   . Hypertension   . Hypothyroidism   . Migraine   . Narcolepsy with cataplexy   . Pneumonia 08/2001   Past Surgical History:  Procedure Laterality Date  . APPENDECTOMY  age 1  . hemilaminectomy     C5-6; C6-7  . PARTIAL  HYSTERECTOMY  age 47  . POSTERIOR CERVICAL FUSION/FORAMINOTOMY  02/05/1975   C5-C6-C7   Social History   Tobacco Use  . Smoking status: Former Smoker    Last attempt to quit: 06/09/1980    Years since quitting: 37.2  . Smokeless tobacco: Never Used  Substance Use Topics  . Alcohol use: No    Alcohol/week: 0.0 oz  . Drug use: No   Family History  Problem Relation Age of Onset  . Depression Brother        suicide  . Diabetes Brother   . Heart disease Mother        MI  . Peripheral vascular disease Mother   . Hyperlipidemia Mother   . Diabetes Brother   . Sleep apnea Son   . Sleep apnea Brother    Allergies  Allergen Reactions  . Penicillins     REACTION: GI upset   Current Outpatient Medications on File Prior to Visit  Medication Sig Dispense Refill  . aspirin 81 MG tablet Take 81  mg by mouth daily.      . benzonatate (TESSALON) 100 MG capsule Take 1-2 capsules (100-200 mg total) by mouth 3 (three) times daily as needed for cough. 40 capsule 0  . Blood Glucose Monitoring Suppl (BLOOD GLUCOSE MONITOR KIT) KIT Check blood sugar once daily as needed for DM     . cetirizine (ZYRTEC) 10 MG tablet Take 10 mg by mouth daily.    Marland Kitchen glipiZIDE (GLIPIZIDE XL) 10 MG 24 hr tablet TAKE 1 TABLET BY MOUTH ONCE DAILY WITH BREAKFAST 90 tablet 1  . glucose blood test strip Check blood sugar once daily as needed for DM.     Marland Kitchen Guaifenesin (MUCINEX MAXIMUM STRENGTH) 1200 MG TB12 Take 1 tablet (1,200 mg total) by mouth every 12 (twelve) hours as needed. 14 tablet 1  . hydrochlorothiazide (HYDRODIURIL) 25 MG tablet TAKE 1 TABLET BY MOUTH ONCE DAILY 90 tablet 1  . ibuprofen (ADVIL,MOTRIN) 200 MG tablet Take 200 mg by mouth every 6 (six) hours as needed.      Marland Kitchen imipramine (TOFRANIL) 50 MG tablet TAKE 1 TABLET BY MOUTH AT BEDTIME 90 tablet 1  . levothyroxine (SYNTHROID, LEVOTHROID) 88 MCG tablet TAKE ONE TABLET BY MOUTH ONCE DAILY 90 tablet 0  . lisinopril (PRINIVIL,ZESTRIL) 20 MG tablet TAKE 1 TABLET BY MOUTH ONCE DAILY 90 tablet 0  . methylphenidate (RITALIN) 20 MG tablet Take 1 tablet (20 mg total) by mouth 3 (three) times daily with meals. 180 tablet 0  . simvastatin (ZOCOR) 80 MG tablet Take 1 tablet (80 mg total) by mouth at bedtime. 90 tablet 1  . sitaGLIPtin (JANUVIA) 100 MG tablet Take 1 tablet (100 mg total) by mouth daily. 30 tablet 11  . zoster vaccine live, PF, (ZOSTAVAX) 65993 UNT/0.65ML injection Inject 19,400 Units into the skin once. (Patient not taking: Reported on 08/24/2017) 1 vial 0   No current facility-administered medications on file prior to visit.     Review of Systems  Constitutional: Negative for activity change, appetite change, fatigue, fever and unexpected weight change.  HENT: Positive for congestion. Negative for ear pain, rhinorrhea, sinus pressure and sore throat.     Eyes: Negative for pain, redness and visual disturbance.  Respiratory: Positive for cough. Negative for shortness of breath and wheezing.   Cardiovascular: Negative for chest pain and palpitations.  Gastrointestinal: Negative for abdominal pain, blood in stool, constipation and diarrhea.  Endocrine: Negative for polydipsia and polyuria.  Genitourinary: Negative for dysuria,  frequency and urgency.  Musculoskeletal: Negative for arthralgias, back pain and myalgias.  Skin: Negative for pallor and rash.  Allergic/Immunologic: Negative for environmental allergies.  Neurological: Negative for dizziness, syncope and headaches.  Hematological: Negative for adenopathy. Does not bruise/bleed easily.  Psychiatric/Behavioral: Negative for decreased concentration and dysphoric mood. The patient is not nervous/anxious.        Pos for stressors        Objective:   Physical Exam  Constitutional: She appears well-developed and well-nourished. No distress.  obese and well appearing   HENT:  Head: Normocephalic and atraumatic.  Mouth/Throat: Oropharynx is clear and moist.  Nares are injected and congested    Eyes: Conjunctivae and EOM are normal. Pupils are equal, round, and reactive to light.  Neck: Normal range of motion. Neck supple. No JVD present. Carotid bruit is not present. No thyromegaly present.  Cardiovascular: Normal rate, regular rhythm, normal heart sounds and intact distal pulses. Exam reveals no gallop.  Pulmonary/Chest: Effort normal and breath sounds normal. No respiratory distress. She has no wheezes. She has no rales.  No crackles  Good air exch  Abdominal: Soft. Bowel sounds are normal. She exhibits no distension, no abdominal bruit and no mass. There is no tenderness.  Musculoskeletal: She exhibits no edema.  Lymphadenopathy:    She has no cervical adenopathy.  Neurological: She is alert. She has normal reflexes. No cranial nerve deficit.  Skin: Skin is warm and dry. No rash  noted. No pallor.  Psychiatric: She has a normal mood and affect.          Assessment & Plan:   Problem List Items Addressed This Visit      Cardiovascular and Mediastinum   Essential hypertension    bp in fair control at this time  BP Readings from Last 1 Encounters:  08/24/17 128/74   No changes needed Disc lifstyle change with low sodium diet and exercise  Labs reviewed         Endocrine   Diabetes type 2, uncontrolled ([place]) - Primary    Lab Results  Component Value Date   HGBA1C 7.4 (H) 08/18/2017   Was off [place] due to cost for a while  Now back on it  Can only tolerate the metformin xr once daily  Expects better result next time Long disc re: lifestyle habits  F/u planned  Sent for recent eye exam report  On ace for renal protection      Relevant Medications   metFORMIN (GLUCOPHAGE-XR) 750 MG 24 hr tablet     Other   Hyperlipidemia LDL goal <100    Disc goals for lipids and reasons to control them Rev labs with pt Rev low sat fat diet in detail LDL is 73- will continue to work on diet  Max dose of simvastatin       Obesity (BMI 30-39.9)    Discussed how this problem influences overall health and the risks it imposes  Reviewed plan for weight loss with lower calorie diet (via better food choices and also portion control or program like weight watchers) and exercise building up to or more than 30 minutes 5 days per week including some aerobic activity         Relevant Medications   metFORMIN (GLUCOPHAGE-XR) 750 MG 24 hr tablet

## 2017-08-24 NOTE — Assessment & Plan Note (Signed)
bp in fair control at this time  BP Readings from Last 1 Encounters:  08/24/17 128/74   No changes needed Disc lifstyle change with low sodium diet and exercise  Labs reviewed

## 2017-08-24 NOTE — Patient Instructions (Addendum)
For diabetic diet  Try to get most of your carbohydrates from produce (with the exception of white potatoes)  Eat less bread/pasta/rice/snack foods/cereals/sweets and other items from the middle of the grocery store (processed carbs) Cut out the soda if you can (once per month or less)   Stay on current medication   We will send for your last eye exam  Do try to get 30 minutes of exercise per day

## 2017-08-24 NOTE — Assessment & Plan Note (Signed)
Lab Results  Component Value Date   HGBA1C 7.4 (H) 08/18/2017   Was off [place] due to cost for a while  Now back on it  Can only tolerate the metformin xr once daily  Expects better result next time Long disc re: lifestyle habits  F/u planned  Sent for recent eye exam report  On ace for renal protection

## 2017-09-08 ENCOUNTER — Encounter: Payer: Self-pay | Admitting: Physician Assistant

## 2017-09-14 ENCOUNTER — Other Ambulatory Visit: Payer: Self-pay | Admitting: Family Medicine

## 2017-09-23 DIAGNOSIS — S0500XA Injury of conjunctiva and corneal abrasion without foreign body, unspecified eye, initial encounter: Secondary | ICD-10-CM | POA: Diagnosis not present

## 2017-09-26 ENCOUNTER — Other Ambulatory Visit: Payer: Self-pay | Admitting: Family Medicine

## 2017-09-26 DIAGNOSIS — S0500XA Injury of conjunctiva and corneal abrasion without foreign body, unspecified eye, initial encounter: Secondary | ICD-10-CM | POA: Diagnosis not present

## 2017-10-06 ENCOUNTER — Encounter

## 2017-10-06 ENCOUNTER — Ambulatory Visit: Payer: Medicare Other | Admitting: Neurology

## 2017-10-06 ENCOUNTER — Encounter: Payer: Self-pay | Admitting: Neurology

## 2017-10-06 DIAGNOSIS — G47411 Narcolepsy with cataplexy: Secondary | ICD-10-CM | POA: Diagnosis not present

## 2017-10-06 MED ORDER — METHYLPHENIDATE HCL 20 MG PO TABS: 20.0000 mg | ORAL_TABLET | Freq: Three times a day (TID) | ORAL | 0 refills | Status: DC

## 2017-10-06 MED ORDER — IMIPRAMINE HCL 50 MG PO TABS: 50.0000 mg | ORAL_TABLET | Freq: Every day | ORAL | 3 refills | Status: DC

## 2017-10-06 NOTE — Progress Notes (Addendum)
[facility]  Provider:  Larey Seat, M D  Referring Provider: Abner Greenspan, MD Primary Care Physician:  Tower, Wynelle Fanny, MD  Chief Complaint  Patient presents with  . Follow-up    Room 11. Patient is alone.     HPI:  Amanda King is a 77 y.o. female seen here as a revisit  from Dr. Glori Bickers for follow up of symptoms of Hypersomnia, RV from 10-31-14 .   I had last seen Amanda King in 2013 after she was referred to me with a diagnosis of narcolepsy with cataplexy.  She had been treated on generic imipramine and Ritalin;   50  mg of imipramine at night , and 20 mg of methylphenidate 4 times daily. In  2013 she described that in spite of the medication she had a cataplectic attack related to the emotionally upsetting news  of her granddaughter's father-in-law's death.  She reported rare sleep hallucinations and had been followed by Dr. Morrell Riddle , Dr. Earley Favor  and was Dr. Tressia Danas patient until his retirement.  The patient states that the narcolepsy was diagnosed  after 1989 with cataplectic attacks.;  she was unsure of the was any viral onset and that she has no family history of narcolepsy but at the time underwent a neck surgery with myelogram and somehow  Remained always sleepier since that time.  She reported ongoing cataplectic spells whenever she tried to change dosing of her medication - she had been using Tofranil as the brand name to sleep  but was due to costs and insurance coverage forced to change to generic imipramine, which triggered additional cataplectic attacks.  We have discussed Xyrem in our last visit, but postponed the initiation as she felt that her current regimen was sufficient. The patient initiates bedtime normally at 11 PM, she will fall asleep promptly, times a week Mondays, Wednesdays and Fridays she will rise at 7 AM to go to the gym. Spontaneously she will wake up between 7 and 8 on other days as well. During the day she often takes a nap on her  couch, about 45 minutes duration that she feels is refreshing. The nocturnal sleep time is estimated to be around 8 hours as well. He can be fragmented but visit dreams but he denied any nightmares or any "acting out "of dreams. She is very emotional at funerals and had multiple cataplectic spells in that setting.  The patient just retired from [employer]. after 24 years and is now rearranging her day-to-day life. 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 She isn't from a smoker but quit over 32 years ago and she has not used alcohol or recreational drugs. She would drink up to 3 cups of caffeinated beverages per day.   Rv 10-31-14; Amanda King is here today for which he revisit and refills. She reports doing fine, he had no paralyzing spells recently no nightmares, no dream intrusion. I asked her about cataplexy and she said it right now she seems to be free of so spells. There is that the only time she experiences cataplexy now is when she is really upsets or last time she was at a funeral and grieving. She endorsed today the Epworth sleepiness score at 12 points down from 13 and the fatigue severity score at  12 points. Revisit from 12-20-14 Amanda King has been diagnosed with narcolepsy and cataplexy she has clinically all the necessary symptoms and an HLA test was positive for the HLA he he 1. We were finally unable in May to start her on Xyrem , and she is now on TOFRANIL which she has tolerated well but she takes 1 nocturnal dose.  She takes up to 4 times a day stimulant medication her case Ritalin. Sometimes she can get by this 2 or 3 doses only. It is remarkable that she has found new employment and the energy to work now 6 days a week. Her treatment made the difference.   Interval history from 12/24/2015, Mrs. Renwick reports today that she  has done very well with her current medical regimen and treatment of narcolepsy. She endorsed 7 points on the Epworth sleepiness score, 9 points on the fatigue severity score and 0 on the geriatric depression score. She has decided that she cannot retire and will work 3 times a week and this keeps her active and happy. She also just told me that her granddaughter's are 54 and 48 years old and that she has already  2 great-grandchildren 57  years old !  Interval history 22nd of January 2018, Amanda King has done very well on Ritalin and Tofranil the treatment of narcolepsy with cataplexy. She was HLA positive for narcolepsy,  she was unable to undergo an MS LT test. She lost her beloved dog just before Christmas 2017 and currently has a new puppy and that she is trying to train. She no longer visits the gym at this time but she has walked more. She loves to read in bed. She does not watch TV in bed.   Interval history from 06 October 2017, Amanda King was last seen by nurse practitioner Megan milligrams 6 months ago.  She is here for routine refill of Ritalin and Tofranil to medications at control narcolepsy and cataplexy.  She has tolerated the medication very well, she has kept physically active she still is working part-time, she reports less frequent dreams and last visit dreams on these medications.  Her Epworth sleepiness score was reduced to 10 points and her fatigue severity is only certain points, the geriatric depression score was endorsed at 0 out of 15 points.  I will refill today Ritalin to 20 mg tablets, taken 2-3 / day.  I gave her 180 # supply .   Review of Systems: Out of a complete 14 system review, the patient complains of only the following symptoms, and all other reviewed systems are negative. The patient endorsed the fatigue severity scale at 11 , and the Epworth Sleepiness Scale at  7points , and the geriatric depression score at 2/15 point. She has significant loss of hearing and wears  hearing aids bilaterally.  Her son installed some exercise equipment in her upstairs bedroom.   Caffeine- after work at night !  Tobacco- none, quit 37 years ago. 1979 ETOH , 1 beer or wine on a week end.    Social History   Socioeconomic History  . Marital status: Widowed    Spouse name: Not on file  . Number of children: 3  . Years of education: GED  . Highest education level: Not on file  Occupational History    Employer: [employer]  Social Needs  . Financial resource strain: Not on file  . Food insecurity:    Worry: Not on file    Inability: Not on file  .  Transportation needs:    Medical: Not on file    Non-medical: Not on file  Tobacco Use  . Smoking status: Former Smoker    Last attempt to quit: 06/09/1980    Years since quitting: 37.3  . Smokeless tobacco: Never Used  Substance and Sexual Activity  . Alcohol use: No    Alcohol/week: 0.0 oz  . Drug use: No  . Sexual activity: Never  Lifestyle  . Physical activity:    Days per week: Not on file    Minutes per session: Not on file  . Stress: Not on file  Relationships  . Social connections:    Talks on phone: Not on file    Gets together: Not on file    Attends religious service: Not on file    Active member of club or organization: Not on file    Attends meetings of clubs or organizations: Not on file    Relationship status: Not on file  . Intimate partner violence:    Fear of current or ex partner: Not on file    Emotionally abused: Not on file    Physically abused: Not on file    Forced sexual activity: Not on file  Other Topics Concern  . Not on file  Social History Narrative   Patient is widowed. and her son lives with her.   Walks for exercise.     Patient has three children.   Patient is right-handed.   Patient drinks three cups of caffeine daily.     Patient is retired.   Patient has a GED.    Family History  Problem Relation Age of Onset  . Depression Brother        suicide  .  Diabetes Brother   . Heart disease Mother        MI  . Peripheral vascular disease Mother   . Hyperlipidemia Mother   . Diabetes Brother   . Sleep apnea Son   . Sleep apnea Brother     Past Medical History:  Diagnosis Date  . Diabetes mellitus    Type II  . Hyperlipidemia   . Hypertension   . Hypothyroidism   . Migraine   . Narcolepsy with cataplexy   . Pneumonia 08/2001    Past Surgical History:  Procedure Laterality Date  . APPENDECTOMY  age 40  . hemilaminectomy     C5-6; C6-7  . PARTIAL HYSTERECTOMY  age 22  . POSTERIOR CERVICAL FUSION/FORAMINOTOMY  02/05/1975   C5-C6-C7    Current Outpatient Medications  Medication Sig Dispense Refill  . aspirin 81 MG tablet Take 81 mg by mouth daily.      . Blood Glucose Monitoring Suppl (BLOOD GLUCOSE MONITOR KIT) KIT Check blood sugar once daily as needed for DM     . cetirizine (ZYRTEC) 10 MG tablet Take 10 mg by mouth daily.    Marland Kitchen glipiZIDE (GLIPIZIDE XL) 10 MG 24 hr tablet TAKE 1 TABLET BY MOUTH ONCE DAILY WITH BREAKFAST 90 tablet 1  . glucose blood test strip Check blood sugar once daily as needed for DM.     . hydrochlorothiazide (HYDRODIURIL) 25 MG tablet TAKE 1 TABLET BY MOUTH ONCE DAILY 90 tablet 1  . ibuprofen (ADVIL,MOTRIN) 200 MG tablet Take 200 mg by mouth every 6 (six) hours as needed.      Marland Kitchen imipramine (TOFRANIL) 50 MG tablet TAKE 1 TABLET BY MOUTH AT BEDTIME 90 tablet 1  . levothyroxine (SYNTHROID, LEVOTHROID) 88 MCG tablet TAKE 1  TABLET BY MOUTH ONCE DAILY 90 tablet 1  . lisinopril (PRINIVIL,ZESTRIL) 20 MG tablet TAKE 1 TABLET BY MOUTH ONCE DAILY 90 tablet 1  . metFORMIN (GLUCOPHAGE-XR) 750 MG 24 hr tablet Take 1 tablet (750 mg total) by mouth daily with breakfast. 90 tablet 1  . methylphenidate (RITALIN) 20 MG tablet Take 1 tablet (20 mg total) by mouth 3 (three) times daily with meals. 180 tablet 0  . simvastatin (ZOCOR) 80 MG tablet Take 1 tablet (80 mg total) by mouth at bedtime. 90 tablet 1  . sitaGLIPtin  (JANUVIA) 100 MG tablet Take 1 tablet (100 mg total) by mouth daily. 30 tablet 11  . zoster vaccine live, PF, (ZOSTAVAX) 37106 UNT/0.65ML injection Inject 19,400 Units into the skin once. 1 vial 0   No current facility-administered medications for this visit.     Allergies as of 10/06/2017 - Review Complete 10/06/2017  Allergen Reaction Noted  . Penicillins  04/02/2007    Vitals: BP (!) 151/70   Pulse 97   Ht '5\' 1"'  (1.549 m)   Wt 162 lb (73.5 kg)   BMI 30.61 kg/m  Last Weight:  Wt Readings from Last 1 Encounters:  10/06/17 162 lb (73.5 kg)   Last Height:   Ht Readings from Last 1 Encounters:  10/06/17 '5\' 1"'  (1.549 m)   Physical exam: stable weight for years,   General: The patient is awake, alert and appears not in acute distress. The patient is well groomed. Head: Normocephalic, atraumatic. Neck is supple. Mallampati 3 , neck circumference:15.25 . Cardiovascular:  Regular rate and rhythm, without  murmurs or carotid bruit, and without distended neck veins. Respiratory: Lungs are clear to auscultation. Skin:  Without evidence of edema,facial rash-  Trunk: BMI is elevated , normal posture.  Neurologic exam : The patient is awake and alert, oriented to place and time.  Memory subjective  described as intact.  There is a normal attention span & concentration ability.   Cranial nerves: No lossof taste or smell. Pupils are equal and briskly reactive to light. Funduscopic exam without  evidence of pallor or edema.Extraocular movements  in vertical and horizontal planes intact and without nystagmus. Visual fields by finger perimetry are intact. Hearing to finger rub intact with hearing aids. Facial sensation intact to fine touch.  Facial motor strength is symmetric and tongue and uvula move midline. Motor exam:   Normal tone and normal muscle bulk and symmetric normal strength in all extremities.   Assessment:  After physical and neurologic examination, review of laboratory  studies, imaging, neurophysiology testing and this patient carries the diagnosis of cataplexy and narcolepsy in 1984, after suffering severe cataplectic attacks.  Sleep hallucinations are rare now. There is no longer access to the original sleep studies.  Plan:  Treatment plan and additional workup :  Intended to have Mrs. Vogel back for and a sleep study followed by an M SLT but she was unable to stay off her medicine especially now that she needs to maintain employment. She continues to work and even some night shift.   I will indefinitely postpone a repeat sleep study she is doing well right now on Tofranil and Ritalin and if we need to place her on Xyrem in the future we would have to do these studies first. I used the diagnosis of narcolepsy with cataplexy.    HLA testing for narcolepsy with cataplexy was positive for HLA - she has clincally all symptoms and she is doing better on Tofranil and Ritalin,  but was denied XYREM,  medicare coverage has finally failed for this patient. She had once not taking the medication for 4 days and suffered from severe sleep a hallucinations, vivid and scary dreams came back.The patient has been treated successfully and now returned to a paid work , 4 days at [employer]. and 3 days at [employer].    I will Refill Tofranil, for cataplexy treatment.  Refilled Ritalin, prn , but usually twice daily for hypersomnia control..   She reports less vivid dreams on these medication.   Larey Seat, MD     10-06-2017    Cc; Dr. Loura Pardon

## 2017-10-06 NOTE — Patient Instructions (Signed)
I believe you have a condition called narcolepsy: This means, that you have a sleep disorder that manifests with at times severe excessive sleepiness during the day and often with problems with sleep at night. We may have to try different medications that may help you stay awake during the day. Not everything works with everybody the same way. Wake promoting agents include stimulants and non-stimulant type medications. The most common side effects with stimulants are weight loss, insomnia, nervousness, headaches, palpitations, rise in blood pressure, anxiety. Stimulants can be addictive and subject to abuse. Non-stimulant type wake promoting medications include Provigil and Nuvigil, most common side effects include headaches, nervousness, insomnia, hypertension. In addition there is a medication called Xyrem which has been proven to be very effective in patients with narcolepsy with or without cataplexy. Some patients with narcolepsy report episodes of weakness, such as jaw or facial weakness, legs giving out, feeling wobbly or like "Jell-o", etc. in situations of anxiety, stress, laughter, sudden sadness, surprise, etc., which is called cataplexy. You can also experience episodes of sleep paralysis during which you may feel unable to move upon awakening. Some people experience dreamlike sequences upon awakening or upon drifting off to sleep, called hypnopompic or hypnagogic hallucinations.  

## 2018-01-16 ENCOUNTER — Other Ambulatory Visit: Payer: Self-pay | Admitting: Family Medicine

## 2018-01-29 ENCOUNTER — Other Ambulatory Visit: Payer: Self-pay | Admitting: Family Medicine

## 2018-02-17 ENCOUNTER — Other Ambulatory Visit: Payer: Self-pay | Admitting: Family Medicine

## 2018-02-19 ENCOUNTER — Telehealth: Payer: Self-pay | Admitting: Family Medicine

## 2018-02-19 ENCOUNTER — Ambulatory Visit: Payer: Medicare Other

## 2018-02-19 NOTE — Telephone Encounter (Signed)
I spoke with Toni Amendourtney at [facility] and pt can request # [phone] and will put remainder of med on refills. Pt will contact [facility] and request the Januvia # 30.

## 2018-02-19 NOTE — Telephone Encounter (Signed)
Copied from CRM [id number]. Topic: Quick Communication - See Telephone Encounter >> Feb 19, 2018  4:16 PM Lorrine KinMcGee, Yuriko Portales B, [place] wrote: CRM for notification. See Telephone encounter for: 02/19/18. Patient calling and states that she is needing a 30 day supply of sitaGLIPtin (JANUVIA) 100 MG tablet,  instead of the 90 day supply. States that she fell into the donut hole with her insurance and can afford to get the 30 days. [facility] [address] [place], [place] - [address]

## 2018-02-24 ENCOUNTER — Encounter: Payer: Medicare Other | Admitting: Family Medicine

## 2018-03-07 ENCOUNTER — Telehealth: Payer: Self-pay | Admitting: Family Medicine

## 2018-03-07 DIAGNOSIS — E785 Hyperlipidemia, unspecified: Secondary | ICD-10-CM

## 2018-03-07 DIAGNOSIS — E039 Hypothyroidism, unspecified: Secondary | ICD-10-CM

## 2018-03-07 DIAGNOSIS — E1165 Type 2 diabetes mellitus with hyperglycemia: Secondary | ICD-10-CM

## 2018-03-07 DIAGNOSIS — I10 Essential (primary) hypertension: Secondary | ICD-10-CM

## 2018-03-07 NOTE — Telephone Encounter (Signed)
-----   Message from Robert Bellow, LPN sent at 01/17/9146  4:13 PM EDT ----- Regarding: Labs 10/1 Lab orders needed. Thank you.  Insurance:  [employer]

## 2018-03-09 ENCOUNTER — Ambulatory Visit (INDEPENDENT_AMBULATORY_CARE_PROVIDER_SITE_OTHER): Payer: Medicare Other

## 2018-03-09 ENCOUNTER — Encounter (INDEPENDENT_AMBULATORY_CARE_PROVIDER_SITE_OTHER): Payer: Self-pay

## 2018-03-09 VITALS — BP 124/70 | HR 95 | Temp 98.7°F | Ht 61.5 in | Wt 162.2 lb

## 2018-03-09 DIAGNOSIS — E785 Hyperlipidemia, unspecified: Secondary | ICD-10-CM

## 2018-03-09 DIAGNOSIS — E039 Hypothyroidism, unspecified: Secondary | ICD-10-CM

## 2018-03-09 DIAGNOSIS — Z0000 Encounter for general adult medical examination without abnormal findings: Secondary | ICD-10-CM | POA: Diagnosis not present

## 2018-03-09 DIAGNOSIS — E1165 Type 2 diabetes mellitus with hyperglycemia: Secondary | ICD-10-CM | POA: Diagnosis not present

## 2018-03-09 DIAGNOSIS — I10 Essential (primary) hypertension: Secondary | ICD-10-CM

## 2018-03-09 DIAGNOSIS — Z23 Encounter for immunization: Secondary | ICD-10-CM

## 2018-03-09 LAB — COMPREHENSIVE METABOLIC PANEL
ALBUMIN: 4.4 g/dL (ref 3.5–5.2)
ALT: 24 U/L (ref 0–35)
AST: 17 U/L (ref 0–37)
Alkaline Phosphatase: 93 U/L (ref 39–117)
BUN: 21 mg/dL (ref 6–23)
CHLORIDE: 99 meq/L (ref 96–112)
CO2: 32 meq/L (ref 19–32)
Calcium: 9.4 mg/dL (ref 8.4–10.5)
Creatinine, Ser: 1.05 mg/dL (ref 0.40–1.20)
GFR: 53.82 mL/min — ABNORMAL LOW (ref >60.00)
Glucose, Bld: 159 mg/dL — ABNORMAL HIGH (ref 70–99)
POTASSIUM: 4.4 meq/L (ref 3.5–5.1)
Sodium: 137 mEq/L (ref 135–145)
Total Bilirubin: 0.4 mg/dL (ref 0.2–1.2)
Total Protein: 7.3 g/dL (ref 6.0–8.3)

## 2018-03-09 LAB — CBC WITH DIFFERENTIAL/PLATELET
BASOS PCT: 0.3 % (ref 0.0–3.0)
Basophils Absolute: 0 10*3/uL (ref 0.0–0.1)
EOS PCT: 2.2 % (ref 0.0–5.0)
Eosinophils Absolute: 0.2 10*3/uL (ref 0.0–0.7)
HEMATOCRIT: 37.9 % (ref 36.0–46.0)
HEMOGLOBIN: 12.8 g/dL (ref 12.0–15.0)
Lymphocytes Relative: 29 % (ref 12.0–46.0)
Lymphs Abs: 2.1 10*3/uL (ref 0.7–4.0)
MCHC: 33.8 g/dL (ref 30.0–36.0)
MCV: 88.6 fl (ref 78.0–100.0)
MONOS PCT: 6.2 % (ref 3.0–12.0)
Monocytes Absolute: 0.4 10*3/uL (ref 0.1–1.0)
Neutro Abs: 4.5 10*3/uL (ref 1.4–7.7)
Neutrophils Relative %: 62.3 % (ref 43.0–77.0)
Platelets: 254 10*3/uL (ref 150.0–400.0)
RBC: 4.28 Mil/uL (ref 3.87–5.11)
RDW: 13.3 % (ref 11.5–15.5)
WBC: 7.3 10*3/uL (ref 4.0–10.5)

## 2018-03-09 LAB — LIPID PANEL
CHOL/HDL RATIO: 5
CHOLESTEROL: 188 mg/dL (ref 0–200)
HDL: 39.4 mg/dL (ref >39.00)
NonHDL: 148.11
TRIGLYCERIDES: 357 mg/dL — AB (ref 0.0–149.0)
VLDL: 71.4 mg/dL — AB (ref 0.0–40.0)

## 2018-03-09 LAB — HEMOGLOBIN A1C: Hgb A1c MFr Bld: 8 % — ABNORMAL HIGH (ref 4.6–6.5)

## 2018-03-09 LAB — TSH: TSH: 1.34 u[IU]/mL (ref 0.35–4.50)

## 2018-03-09 LAB — LDL CHOLESTEROL, DIRECT: Direct LDL: 94 mg/dL

## 2018-03-09 NOTE — Progress Notes (Signed)
PCP notes:   Health maintenance:  A1C - completed Flu vaccine - administered  Abnormal screenings:   None  Patient concerns:   None  Nurse concerns:  None  Next PCP appt:   03/30/18 @ 0930  I reviewed health advisor's note, was available for consultation, and agree with documentation and plan. Roxy Manns MD

## 2018-03-09 NOTE — Patient Instructions (Addendum)
Amanda King , Thank you for taking time to come for your Medicare Wellness Visit. I appreciate your ongoing commitment to your health goals. Please review the following plan we discussed and let me know if I can assist you in the future.   These are the goals we discussed: Goals    . Patient Stated     Starting 03/09/2018, I will continue to take medications as prescribed.        This is a list of the screening recommended for you and due dates:  Health Maintenance  Topic Date Due  . Eye exam for diabetics  03/20/2018  . Complete foot exam   08/25/2018  . Hemoglobin A1C  09/08/2018  . Tetanus Vaccine  06/30/2021  . Flu Shot  Completed  . DEXA scan (bone density measurement)  Completed  . Pneumonia vaccines  Completed   Preventive Care for Adults  A healthy lifestyle and preventive care can promote health and wellness. Preventive health guidelines for adults include the following key practices.  . A routine yearly physical is a good way to check with your health care provider about your health and preventive screening. It is a chance to share any concerns and updates on your health and to receive a thorough exam.  . Visit your dentist for a routine exam and preventive care every 6 months. Brush your teeth twice a day and floss once a day. Good oral hygiene prevents tooth decay and gum disease.  . The frequency of eye exams is based on your age, health, family medical history, use  of contact lenses, and other factors. Follow your health care provider's recommendations for frequency of eye exams.  . Eat a healthy diet. Foods like vegetables, fruits, whole grains, low-fat dairy products, and lean protein foods contain the nutrients you need without too many calories. Decrease your intake of foods high in solid fats, added sugars, and salt. Eat the right amount of calories for you. Get information about a proper diet from your health care provider, if necessary.  . Regular physical  exercise is one of the most important things you can do for your health. Most adults should get at least 150 minutes of moderate-intensity exercise (any activity that increases your heart rate and causes you to sweat) each week. In addition, most adults need muscle-strengthening exercises on 2 or more days a week.  Silver Sneakers may be a benefit available to you. To determine eligibility, you may visit the website: www.silversneakers.com or contact program at 216-346-4341 Mon-Fri between 8AM-8PM.   . Maintain a healthy weight. The body mass index (BMI) is a screening tool to identify possible weight problems. It provides an estimate of body fat based on height and weight. Your health care provider can find your BMI and can help you achieve or maintain a healthy weight.   For adults 20 years and older: ? A BMI below 18.5 is considered underweight. ? A BMI of 18.5 to 24.9 is normal. ? A BMI of 25 to 29.9 is considered overweight. ? A BMI of 30 and above is considered obese.   . Maintain normal blood lipids and cholesterol levels by exercising and minimizing your intake of saturated fat. Eat a balanced diet with plenty of fruit and vegetables. Blood tests for lipids and cholesterol should begin at age 4 and be repeated every 5 years. If your lipid or cholesterol levels are high, you are over 50, or you are at high risk for heart disease, you may  need your cholesterol levels checked more frequently. Ongoing high lipid and cholesterol levels should be treated with medicines if diet and exercise are not working.  . If you smoke, find out from your health care provider how to quit. If you do not use tobacco, please do not start.  . If you choose to drink alcohol, please do not consume more than 2 drinks per day. One drink is considered to be 12 ounces (355 mL) of beer, 5 ounces (148 mL) of wine, or 1.5 ounces (44 mL) of liquor.  . If you are 23-4 years old, ask your health care provider if you  should take aspirin to prevent strokes.  . Use sunscreen. Apply sunscreen liberally and repeatedly throughout the day. You should seek shade when your shadow is shorter than you. Protect yourself by wearing long sleeves, pants, a wide-brimmed hat, and sunglasses year round, whenever you are outdoors.  . Once a month, do a whole body skin exam, using a mirror to look at the skin on your back. Tell your health care provider of new moles, moles that have irregular borders, moles that are larger than a pencil eraser, or moles that have changed in shape or color.

## 2018-03-09 NOTE — Progress Notes (Signed)
Subjective:   Amanda King is a 76 y.o. female who presents for Medicare Annual (Subsequent) preventive examination.  Review of Systems:  N/A Cardiac Risk Factors include: advanced age (>74mn, >>104women);diabetes mellitus;dyslipidemia;hypertension;obesity (BMI >30kg/m2)     Objective:     Vitals: BP 124/70 (BP Location: Right Arm, Patient Position: Sitting, Cuff Size: Normal)   Pulse 95   Temp 98.7 F (37.1 C) (Oral)   Ht 5' 1.5" (1.562 m) Comment: no shoes  Wt 162 lb 4 oz (73.6 kg)   SpO2 98%   BMI 30.16 kg/m   Body mass index is 30.16 kg/m.  Advanced Directives 03/09/2018 02/19/2016  Does Patient Have a Medical Advance Directive? No No  Would patient like information on creating a medical advance directive? No - Patient declined Yes - EScientist, clinical (histocompatibility and immunogenetics)given    Tobacco Social History   Tobacco Use  Smoking Status Former Smoker  . Last attempt to quit: 06/09/1980  . Years since quitting: 37.7  Smokeless Tobacco Never Used     Counseling given: No   Clinical Intake:  Pre-visit preparation completed: Yes  Pain : No/denies pain Pain Score: 0-No pain     Nutritional Status: BMI > 30  Obese Nutritional Risks: None Diabetes: No  How often do you need to have someone help you when you read instructions, pamphlets, or other written materials from your doctor or pharmacy?: 1 - Never What is the last grade level you completed in school?: GED  Interpreter Needed?: No  Comments: pt is a widow. pt and son live together.  Information entered by :: LPinson, LPN  Past Medical History:  Diagnosis Date  . Diabetes mellitus    Type II  . Hyperlipidemia   . Hypertension   . Hypothyroidism   . Migraine   . Narcolepsy with cataplexy   . Pneumonia 08/2001   Past Surgical History:  Procedure Laterality Date  . APPENDECTOMY  age 77 . hemilaminectomy     C5-6; C6-7  . PARTIAL HYSTERECTOMY  age 77 . POSTERIOR CERVICAL FUSION/FORAMINOTOMY  02/05/1975   C5-C6-C7   Family History  Problem Relation Age of Onset  . Depression Brother        suicide  . Diabetes Brother   . Heart disease Mother        MI  . Peripheral vascular disease Mother   . Hyperlipidemia Mother   . Diabetes Brother   . Sleep apnea Son   . Sleep apnea Brother    Social History   Socioeconomic History  . Marital status: Widowed    Spouse name: Not on file  . Number of children: 3  . Years of education: GED  . Highest education level: Not on file  Occupational History    Employer: [employer]  Social Needs  . Financial resource strain: Not on file  . Food insecurity:    Worry: Not on file    Inability: Not on file  . Transportation needs:    Medical: Not on file    Non-medical: Not on file  Tobacco Use  . Smoking status: Former Smoker    Last attempt to quit: 06/09/1980    Years since quitting: 37.7  . Smokeless tobacco: Never Used  Substance and Sexual Activity  . Alcohol use: No    Alcohol/week: 0.0 standard drinks  . Drug use: No  . Sexual activity: Never  Lifestyle  . Physical activity:    Days per week: Not on file  Minutes per session: Not on file  . Stress: Not on file  Relationships  . Social connections:    Talks on phone: Not on file    Gets together: Not on file    Attends religious service: Not on file    Active member of club or organization: Not on file    Attends meetings of clubs or organizations: Not on file    Relationship status: Not on file  Other Topics Concern  . Not on file  Social History Narrative   Patient is widowed. and her son lives with her.   Walks for exercise.     Patient has three children.   Patient is right-handed.   Patient drinks three cups of caffeine daily.     Patient is retired.   Patient has a GED.    Outpatient Encounter Medications as of 03/09/2018  Medication Sig  . aspirin 81 MG tablet Take 81 mg by mouth daily.    . Blood Glucose Monitoring Suppl (BLOOD GLUCOSE MONITOR KIT) KIT  Check blood sugar once daily as needed for DM   . cetirizine (ZYRTEC) 10 MG tablet Take 10 mg by mouth daily.  Marland Kitchen glipiZIDE (GLUCOTROL XL) 10 MG 24 hr tablet TAKE 1 TABLET BY MOUTH ONCE DAILY WITH  BREAKFAST.  Marland Kitchen glucose blood test strip Check blood sugar once daily as needed for DM.   . hydrochlorothiazide (HYDRODIURIL) 25 MG tablet TAKE 1 TABLET BY MOUTH ONCE DAILY  . ibuprofen (ADVIL,MOTRIN) 200 MG tablet Take 200 mg by mouth every 6 (six) hours as needed.    Marland Kitchen imipramine (TOFRANIL) 50 MG tablet Take 1 tablet (50 mg total) by mouth at bedtime.  Marland Kitchen levothyroxine (SYNTHROID, LEVOTHROID) 88 MCG tablet TAKE 1 TABLET BY MOUTH ONCE DAILY  . lisinopril (PRINIVIL,ZESTRIL) 20 MG tablet TAKE 1 TABLET BY MOUTH ONCE DAILY  . metFORMIN (GLUCOPHAGE-XR) 750 MG 24 hr tablet Take 1 tablet (750 mg total) by mouth daily with breakfast.  . methylphenidate (RITALIN) 20 MG tablet Take 1 tablet (20 mg total) by mouth 3 (three) times daily with meals.  . simvastatin (ZOCOR) 80 MG tablet TAKE 1 TABLET BY MOUTH AT BEDTIME  . sitaGLIPtin (JANUVIA) 100 MG tablet Take 1 tablet (100 mg total) by mouth daily.  Marland Kitchen zoster vaccine live, PF, (ZOSTAVAX) 23557 UNT/0.65ML injection Inject 19,400 Units into the skin once.   No facility-administered encounter medications on file as of 03/09/2018.     Activities of Daily Living In your present state of health, do you have any difficulty performing the following activities: 03/09/2018  Hearing? Y  Vision? N  Difficulty concentrating or making decisions? N  Walking or climbing stairs? N  Dressing or bathing? N  Doing errands, shopping? N  Preparing Food and eating ? N  Using the Toilet? N  In the past six months, have you accidently leaked urine? N  Do you have problems with loss of bowel control? N  Managing your Medications? N  Managing your Finances? N  Housekeeping or managing your Housekeeping? N  Some recent data might be hidden    Patient Care Team: Tower, Wynelle Fanny, MD  as PCP - General Dohmeier, Asencion Partridge, MD as Consulting Physician (Neurology) Arlie Solomons, MD as Consulting Physician (Audiology)    Assessment:   This is a routine wellness examination for Danell.  Hearing Screening Comments: Bilateral hearing aids Vision Screening Comments: Vision exam in 2018 @ [facility]   Exercise Activities and Dietary recommendations Current Exercise Habits: Home exercise routine,  Type of exercise: walking, Time (Minutes): 30, Frequency (Times/Week): 2, Weekly Exercise (Minutes/Week): 60, Intensity: Mild, Exercise limited by: None identified  Goals    . Patient Stated     Starting 03/09/2018, I will continue to take medications as prescribed.        Fall Risk Fall Risk  03/09/2018 07/20/2017 02/19/2016 02/15/2016 09/21/2013  Falls in the past year? _0    Depression Screen PHQ 2/9 Scores 03/09/2018 07/20/2017 02/19/2016 02/15/2016  PHQ - 2 Score 0 0 0 0  PHQ- 9 Score 0 - - -     Cognitive Function MMSE - Mini Mental State Exam 03/09/2018 02/19/2016  Orientation to time 5 5  Orientation to Place 5 5  Registration 3 3  Attention/ Calculation 0 0  Recall 3 3  Language- name 2 objects 0 0  Language- repeat 1 1  Language- follow 3 step command 3 3  Language- read & follow direction 0 0  Write a sentence 0 0  Copy design 0 0  Total score 20 20   PLEASE NOTE: A Mini-Cog screen was completed. Maximum score is 20. A value of 0 denotes this part of Folstein MMSE was not completed or the patient failed this part of the Mini-Cog screening.   Mini-Cog Screening Orientation to Time - Max 5 pts Orientation to Place - Max 5 pts Registration - Max 3 pts Recall - Max 3 pts Language Repeat - Max 1 pts Language Follow 3 Step Command - Max 3 pts  Montreal Cognitive Assessment  06/26/2015  Visuospatial/ Executive (0/5) 5  Naming (0/3) 2  Attention: Read list of digits (0/2) 2  Attention: Read list of letters (0/1) 1  Attention: Serial 7 subtraction starting  at 100 (0/3) 2  Language: Repeat phrase (0/2) 1  Language : Fluency (0/1) 1  Abstraction (0/2) 2  Delayed Recall (0/5) 4  Orientation (0/6) 6  Total 26      Immunization History  Administered Date(s) Administered  . Influenza Split 03/10/2011  . Influenza Whole 06/09/2002, 04/09/2007, 03/02/2008, 03/23/2009, 04/09/2010  . Influenza, High Dose Seasonal PF 05/13/2017, 03/09/2018  . Influenza,inj,Quad PF,6+ Mos 04/30/2015, 02/15/2016  . Influenza-Unspecified 03/07/2014  . Pneumococcal Conjugate-13 08/03/2015  . Pneumococcal Polysaccharide-23 06/09/2002, 03/02/2008  . Td 04/25/1997  . Tdap 07/01/2011    Screening Tests Health Maintenance  Topic Date Due  . OPHTHALMOLOGY EXAM  03/20/2018  . FOOT EXAM  08/25/2018  . HEMOGLOBIN A1C  09/08/2018  . TETANUS/TDAP  06/30/2021  . INFLUENZA VACCINE  Completed  . DEXA SCAN  Completed  . PNA vac Low Risk Adult  Completed      Plan:     I have personally reviewed, addressed, and noted the following in the patient's chart:  A. Medical and social history B. Use of alcohol, tobacco or illicit drugs  C. Current medications and supplements D. Functional ability and status E.  Nutritional status F.  Physical activity G. Advance directives H. List of other physicians I.  Hospitalizations, surgeries, and ER visits in previous 12 months J.  [place] to include hearing, vision, cognitive, depression L. Referrals and appointments - none  In addition, I have reviewed and discussed with patient certain preventive protocols, quality metrics, and best practice recommendations. A written personalized care plan for preventive services as well as general preventive health recommendations were provided to patient.  See attached scanned questionnaire for additional information.   Signed,   Lindell Noe, MHA, BS, LPN Health Coach

## 2018-03-16 ENCOUNTER — Other Ambulatory Visit: Payer: Self-pay | Admitting: Family Medicine

## 2018-03-25 ENCOUNTER — Other Ambulatory Visit: Payer: Self-pay | Admitting: Family Medicine

## 2018-03-30 ENCOUNTER — Ambulatory Visit (INDEPENDENT_AMBULATORY_CARE_PROVIDER_SITE_OTHER): Payer: Medicare Other | Admitting: Family Medicine

## 2018-03-30 ENCOUNTER — Encounter: Payer: Self-pay | Admitting: Family Medicine

## 2018-03-30 VITALS — BP 152/76 | HR 98 | Temp 98.0°F | Ht 61.5 in | Wt 161.5 lb

## 2018-03-30 DIAGNOSIS — I10 Essential (primary) hypertension: Secondary | ICD-10-CM | POA: Diagnosis not present

## 2018-03-30 DIAGNOSIS — Z1231 Encounter for screening mammogram for malignant neoplasm of breast: Secondary | ICD-10-CM

## 2018-03-30 DIAGNOSIS — Z0000 Encounter for general adult medical examination without abnormal findings: Secondary | ICD-10-CM

## 2018-03-30 DIAGNOSIS — E039 Hypothyroidism, unspecified: Secondary | ICD-10-CM

## 2018-03-30 DIAGNOSIS — E1165 Type 2 diabetes mellitus with hyperglycemia: Secondary | ICD-10-CM | POA: Diagnosis not present

## 2018-03-30 DIAGNOSIS — E669 Obesity, unspecified: Secondary | ICD-10-CM

## 2018-03-30 DIAGNOSIS — E785 Hyperlipidemia, unspecified: Secondary | ICD-10-CM

## 2018-03-30 DIAGNOSIS — Z1211 Encounter for screening for malignant neoplasm of colon: Secondary | ICD-10-CM

## 2018-03-30 MED ORDER — HYDROCHLOROTHIAZIDE 25 MG PO TABS: 25.0000 mg | ORAL_TABLET | Freq: Every day | ORAL | 3 refills | Status: DC

## 2018-03-30 MED ORDER — GLIPIZIDE ER 10 MG PO TB24: 10.0000 mg | ORAL_TABLET | Freq: Every day | ORAL | 3 refills | Status: DC

## 2018-03-30 MED ORDER — SITAGLIPTIN PHOSPHATE 100 MG PO TABS: 100.0000 mg | ORAL_TABLET | Freq: Every day | ORAL | 0 refills | Status: DC

## 2018-03-30 MED ORDER — LEVOTHYROXINE SODIUM 88 MCG PO TABS: 88.0000 ug | ORAL_TABLET | Freq: Every day | ORAL | 3 refills | Status: DC

## 2018-03-30 MED ORDER — METFORMIN HCL ER 750 MG PO TB24: 750.0000 mg | ORAL_TABLET | Freq: Every day | ORAL | 3 refills | Status: DC

## 2018-03-30 MED ORDER — SIMVASTATIN 80 MG PO TABS: 80.0000 mg | ORAL_TABLET | Freq: Every day | ORAL | 3 refills | Status: DC

## 2018-03-30 NOTE — Assessment & Plan Note (Signed)
Discussed how this problem influences overall health and the risks it imposes  Reviewed plan for weight loss with lower calorie diet (via better food choices and also portion control or program like weight watchers) and exercise building up to or more than 30 minutes 5 days per week including some aerobic activity    

## 2018-03-30 NOTE — Assessment & Plan Note (Signed)
Disc goals for lipids and reasons to control them Rev last labs with pt Rev low sat fat diet in detail Continue statin and diet /zocor  Trig should improve when DM control does

## 2018-03-30 NOTE — Assessment & Plan Note (Signed)
bp up today in a state of stress over family issue Was normal earlier this mo  Will re check in 3 mo  BP Readings from Last 1 Encounters:  03/30/18 (!) 152/76   No changes needed Most recent labs reviewed  Disc lifstyle change with low sodium diet and exercise

## 2018-03-30 NOTE — Patient Instructions (Addendum)
Keep exercising ! (go to the gym if you cannot get outdoors)   Get your eye exam scheduled, you are due soon   We will refer you for a mammogram at check out   Try to get 1200-1500 mg of calcium per day with at least 1000 iu of vitamin D - for bone health   If you are interested in the new shingles vaccine (Shingrix) - call your local pharmacy to check on coverage and availability  If affordable, get on a wait list at your pharmacy to get the vaccine.  Follow up in 3 months for diabetes -we will check A1C that day   Keep working on healthy diet   Blood pressure is high today (? From stress)  We can add medicine next time if it remains high  Make sure to ask your pharmacist to give you only 30 days at a time of Januvia until you are out of the donut hole

## 2018-03-30 NOTE — Assessment & Plan Note (Signed)
Lab Results  Component Value Date   HGBA1C 8.0 (H) 03/09/2018   Now finally able to get Januvia in donut hole  Should improve next time Disc imp of diet /exercise She will make eye exam appt soon F/u 3 mo

## 2018-03-30 NOTE — Progress Notes (Signed)
Subjective:    Patient ID: Amanda King, female    DOB: Nov 03, 1940, 77 y.o.   MRN: [id number]  HPI  Here for health maintenance exam and to review chronic medical problems   Doing fine   Taking care of herself  Working 2 d per week  Happy about that but she wants to keep working   [employer] from Last 3 Encounters:  03/30/18 161 lb 8 oz (73.3 kg)  03/09/18 162 lb 4 oz (73.6 kg)  10/06/17 162 lb (73.5 kg)  diet- is fair / less red meat/more fruits and veg  Wants to loose weight  Exercise - walking - 30 minutes per day (in our out- did join a gym and can go more)  30.02 kg/m      Wt Readings from Last 3 Encounters:  03/30/18 161 lb 8 oz (73.3 kg)  03/09/18 162 lb 4 oz (73.6 kg)  10/06/17 162 lb (73.5 kg)   30.02 kg/m   Had amw 10/1 Given flu vaccine   Eye exam -due for that soon/will get a card in the mail  Can afford it now   Breast cancer screening - has not been  No lumps on self exam  Needs ref for [facility] mammogram   dexa 4/15 Declines another one No falls or fx Taking vitamin D and ca   Zoster status-plans to get shingrix at [facility] (has some coverage)   Colon cancer screening  Neg ifob 7/15 Not interested at her age   bp is up on first check today (stressed with family problems and did not sleep well last night)  No cp or palpitations or headaches or edema  No side effects to medicines  BP Readings from Last 3 Encounters:  03/30/18 (!) 152/76  03/09/18 124/70  10/06/17 (!) 151/70   lisinopril 20 mg   hctz 25   Diabetes Home sugar results (lately better 120s/140s)  She stops eating after 6:30 -that helps Drinking more water  DM diet -better (hardly any ice cream) Exercise - better  Symptoms-none  A1C last  Lab Results  Component Value Date   HGBA1C 8.0 (H) 03/09/2018  this is up from 7.4 Metformin xr -tolerates it once daily  Glipizide  januvia - stopped for a while in the donut hole (could not afford it) - now she gets it in 30  day increments and can afford it  Out of the hole in donut hole   No problems with medications  Renal protection-ace Last eye exam  -10/18  Hypothyroidism  Pt has no clinical changes No change in energy level/ hair or skin/ edema and no tremor Lab Results  Component Value Date   TSH 1.34 03/09/2018       Hyperlipidemia  Lab Results  Component Value Date   CHOL 188 03/09/2018   CHOL 199 08/18/2017   CHOL 208 (H) 12/05/2016   Lab Results  Component Value Date   HDL 39.40 03/09/2018   HDL 42.00 08/18/2017   HDL 41.10 12/05/2016   Lab Results  Component Value Date   LDLCALC 73 06/21/2014   LDLCALC 49 09/14/2013   LDLCALC 51 12/31/2011   Lab Results  Component Value Date   TRIG 357.0 (H) 03/09/2018   TRIG 366.0 (H) 08/18/2017   TRIG 368.0 (H) 12/05/2016   Lab Results  Component Value Date   CHOLHDL 5 03/09/2018   CHOLHDL 5 08/18/2017   CHOLHDL 5 12/05/2016   Lab Results  Component Value Date  LDLDIRECT 94.0 03/09/2018   LDLDIRECT 87.0 08/18/2017   LDLDIRECT 99.0 12/05/2016  simvastatin 80 Needs better DM control for trig   Lab Results  Component Value Date   CREATININE 1.05 03/09/2018   BUN 21 03/09/2018   NA 137 03/09/2018   K 4.4 03/09/2018   CL 99 03/09/2018   CO2 32 03/09/2018   Lab Results  Component Value Date   ALT 24 03/09/2018   AST 17 03/09/2018   ALKPHOS 93 03/09/2018   BILITOT 0.4 03/09/2018       Patient Active Problem List   Diagnosis Date Noted  . Screening mammogram, encounter for 03/30/2018  . Routine general medical examination at a health care facility 01/31/2016  . Narcolepsy with cataplexy 10/31/2014  . Stress reaction 03/28/2014  . Encounter for Medicare annual wellness exam 09/22/2013  . Hypersomnia, persistent 07/15/2013  . Colon cancer screening 12/31/2011  . Obesity (BMI 30-39.9) 07/01/2011  . ANXIETY 05/06/2010  . DERMATOPHYTOSIS OF NAIL 12/21/2008  . Hypothyroidism 09/02/2006  . Diabetes type 2,  uncontrolled ([place]) 09/02/2006  . Hyperlipidemia LDL goal <100 09/02/2006  . NARCOLEPSY W/CATAPLEXY 09/02/2006  . Essential hypertension 09/02/2006  . PNEUMONIA, HX OF 09/02/2006   Past Medical History:  Diagnosis Date  . Diabetes mellitus    Type II  . Hyperlipidemia   . Hypertension   . Hypothyroidism   . Migraine   . Narcolepsy with cataplexy   . Pneumonia 08/2001   Past Surgical History:  Procedure Laterality Date  . APPENDECTOMY  age 89  . hemilaminectomy     C5-6; C6-7  . PARTIAL HYSTERECTOMY  age 33  . POSTERIOR CERVICAL FUSION/FORAMINOTOMY  02/05/1975   C5-C6-C7   Social History   Tobacco Use  . Smoking status: Former Smoker    Last attempt to quit: 06/09/1980    Years since quitting: 37.8  . Smokeless tobacco: Never Used  Substance Use Topics  . Alcohol use: No    Alcohol/week: 0.0 standard drinks  . Drug use: No   Family History  Problem Relation Age of Onset  . Depression Brother        suicide  . Diabetes Brother   . Heart disease Mother        MI  . Peripheral vascular disease Mother   . Hyperlipidemia Mother   . Diabetes Brother   . Sleep apnea Son   . Sleep apnea Brother    Allergies  Allergen Reactions  . Penicillins     REACTION: GI upset   Current Outpatient Medications on File Prior to Visit  Medication Sig Dispense Refill  . aspirin 81 MG tablet Take 81 mg by mouth daily.      . Blood Glucose Monitoring Suppl (BLOOD GLUCOSE MONITOR KIT) KIT Check blood sugar once daily as needed for DM     . cetirizine (ZYRTEC) 10 MG tablet Take 10 mg by mouth daily.    Marland Kitchen glucose blood test strip Check blood sugar once daily as needed for DM.     Marland Kitchen ibuprofen (ADVIL,MOTRIN) 200 MG tablet Take 200 mg by mouth every 6 (six) hours as needed.      Marland Kitchen imipramine (TOFRANIL) 50 MG tablet Take 1 tablet (50 mg total) by mouth at bedtime. 90 tablet 3  . lisinopril (PRINIVIL,ZESTRIL) 20 MG tablet TAKE 1 TABLET BY MOUTH ONCE DAILY 90 tablet 1  . methylphenidate  (RITALIN) 20 MG tablet Take 1 tablet (20 mg total) by mouth 3 (three) times daily with meals. 180 tablet  0   No current facility-administered medications on file prior to visit.     Review of Systems  Constitutional: Negative for activity change, appetite change, fatigue, fever and unexpected weight change.  HENT: Negative for congestion, ear pain, rhinorrhea, sinus pressure and sore throat.   Eyes: Negative for pain, redness and visual disturbance.  Respiratory: Negative for cough, shortness of breath and wheezing.   Cardiovascular: Negative for chest pain and palpitations.  Gastrointestinal: Negative for abdominal pain, blood in stool, constipation and diarrhea.  Endocrine: Negative for polydipsia and polyuria.  Genitourinary: Negative for dysuria, frequency and urgency.  Musculoskeletal: Negative for arthralgias, back pain and myalgias.       Aches and pains Arthritis in hands  Skin: Negative for pallor and rash.  Allergic/Immunologic: Negative for environmental allergies.  Neurological: Negative for dizziness, syncope and headaches.  Hematological: Negative for adenopathy. Does not bruise/bleed easily.  Psychiatric/Behavioral: Negative for decreased concentration and dysphoric mood. The patient is not nervous/anxious.        Objective:   Physical Exam  Constitutional: She appears well-developed and well-nourished. No distress.  obese and well appearing   HENT:  Head: Normocephalic and atraumatic.  Right Ear: External ear normal.  Left Ear: External ear normal.  Mouth/Throat: Oropharynx is clear and moist.  Eyes: Pupils are equal, round, and reactive to light. Conjunctivae and EOM are normal. No scleral icterus.  Neck: Normal range of motion. Neck supple. No JVD present. Carotid bruit is not present. No thyromegaly present.  Cardiovascular: Normal rate, regular rhythm, normal heart sounds and intact distal pulses. Exam reveals no gallop.  Pulmonary/Chest: Effort normal and  breath sounds normal. No respiratory distress. She has no wheezes. She exhibits no tenderness. No breast tenderness, discharge or bleeding.  Abdominal: Soft. Bowel sounds are normal. She exhibits no distension, no abdominal bruit and no mass. There is no tenderness.  Genitourinary: No breast tenderness, discharge or bleeding.  Genitourinary Comments: Breast exam: No mass, nodules, thickening, tenderness, bulging, retraction, inflamation, nipple discharge or skin changes noted.  No axillary or clavicular LA.      Musculoskeletal: Normal range of motion. She exhibits no edema or tenderness.  Lymphadenopathy:    She has no cervical adenopathy.  Neurological: She is alert. She has normal reflexes. She displays normal reflexes. No cranial nerve deficit. She exhibits normal muscle tone. Coordination normal.  Skin: Skin is warm and dry. No rash noted. No erythema. No pallor.  Solar lentigines diffusely sks scattered on trunk Skin tags-neck Angiomas-small/ back Tanned milldy  Psychiatric: She has a normal mood and affect.  Pleasant           Assessment & Plan:   Problem List Items Addressed This Visit      Cardiovascular and Mediastinum   Essential hypertension    bp up today in a state of stress over family issue Was normal earlier this mo  Will re check in 3 mo  BP Readings from Last 1 Encounters:  03/30/18 (!) 152/76   No changes needed Most recent labs reviewed  Disc lifstyle change with low sodium diet and exercise        Relevant Medications   hydrochlorothiazide (HYDRODIURIL) 25 MG tablet   simvastatin (ZOCOR) 80 MG tablet     Endocrine   Diabetes type 2, uncontrolled ([place])    Lab Results  Component Value Date   HGBA1C 8.0 (H) 03/09/2018   Now finally able to get Januvia in donut hole  Should improve next time Disc imp  of diet /exercise She will make eye exam appt soon F/u 3 mo        Relevant Medications   glipiZIDE (GLUCOTROL XL) 10 MG 24 hr tablet    metFORMIN (GLUCOPHAGE-XR) 750 MG 24 hr tablet   simvastatin (ZOCOR) 80 MG tablet   sitaGLIPtin (JANUVIA) 100 MG tablet   Hypothyroidism    Hypothyroidism  Pt has no clinical changes No change in energy level/ hair or skin/ edema and no tremor Lab Results  Component Value Date   TSH 1.34 03/09/2018          Relevant Medications   levothyroxine (SYNTHROID, LEVOTHROID) 88 MCG tablet     Other   Colon cancer screening    Declines further colon cancer screening at her age      Hyperlipidemia LDL goal <100    Disc goals for lipids and reasons to control them Rev last labs with pt Rev low sat fat diet in detail Continue statin and diet /zocor  Trig should improve when DM control does       Relevant Medications   hydrochlorothiazide (HYDRODIURIL) 25 MG tablet   simvastatin (ZOCOR) 80 MG tablet   Obesity (BMI 30-39.9)    Discussed how this problem influences overall health and the risks it imposes  Reviewed plan for weight loss with lower calorie diet (via better food choices and also portion control or program like weight watchers) and exercise building up to or more than 30 minutes 5 days per week including some aerobic activity         Relevant Medications   glipiZIDE (GLUCOTROL XL) 10 MG 24 hr tablet   metFORMIN (GLUCOPHAGE-XR) 750 MG 24 hr tablet   sitaGLIPtin (JANUVIA) 100 MG tablet   Routine general medical examination at a health care facility - Primary    Reviewed health habits including diet and exercise and skin cancer prevention Reviewed appropriate screening tests for age  Also reviewed health mt list, fam hx and immunization status , as well as social and family history   See HPI Rev amw Rev labs  Declines colon cancer screening or bone density assessment Plans to get shingrix  Disc diet/exercise for wt loss Also ca and D for bone health She will schedule eye exam  Mammogram ref done       Screening mammogram, encounter for   Relevant Orders   MM 3D  SCREEN BREAST BILATERAL

## 2018-03-30 NOTE — Assessment & Plan Note (Signed)
Hypothyroidism  Pt has no clinical changes No change in energy level/ hair or skin/ edema and no tremor Lab Results  Component Value Date   TSH 1.34 03/09/2018

## 2018-03-30 NOTE — Assessment & Plan Note (Signed)
Declines further colon cancer screening at her age 

## 2018-03-30 NOTE — Assessment & Plan Note (Signed)
Reviewed health habits including diet and exercise and skin cancer prevention Reviewed appropriate screening tests for age  Also reviewed health mt list, fam hx and immunization status , as well as social and family history   See HPI Rev amw Rev labs  Declines colon cancer screening or bone density assessment Plans to get shingrix  Disc diet/exercise for wt loss Also ca and D for bone health She will schedule eye exam  Mammogram ref done

## 2018-04-14 ENCOUNTER — Encounter: Payer: Self-pay | Admitting: Adult Health

## 2018-04-14 ENCOUNTER — Ambulatory Visit: Payer: Medicare Other | Admitting: Adult Health

## 2018-04-14 ENCOUNTER — Other Ambulatory Visit: Payer: Self-pay | Admitting: Neurology

## 2018-04-14 VITALS — BP 149/73 | HR 101 | Ht 61.5 in | Wt 161.6 lb

## 2018-04-14 DIAGNOSIS — G47411 Narcolepsy with cataplexy: Secondary | ICD-10-CM

## 2018-04-14 MED ORDER — METHYLPHENIDATE HCL 20 MG PO TABS: 20.0000 mg | ORAL_TABLET | Freq: Three times a day (TID) | ORAL | 0 refills | Status: DC

## 2018-04-14 NOTE — Patient Instructions (Signed)
Your Plan:  Continue ritalin and tofranil  If your symptoms worsen or you develop new symptoms please let us know.  Thank you for coming to see [place] at [facility]. I hope we have been able to provide you high quality care today.  You may receive a patient satisfaction survey over the next few weeks. We would appreciate your feedback and comments so that we may continue to improve ourselves and the health of our patients.

## 2018-04-14 NOTE — Progress Notes (Signed)
PATIENT: Amanda King DOB: [birth year]  REASON FOR VISIT: follow up HISTORY FROM: patient  HISTORY OF PRESENT ILLNESS: Today 04/14/18:  Amanda King is a 77 year old female with a history of narcolepsy with cataplexy.  She returns today for follow-up.  She remains on tofranil for cataplexy.  She continues on Ritalin 20 mg 3 times a day for narcolepsy.  She reports that really continues to work well.  She states that she is retired but works 3 days a week at [employer].  She states that she is not falling asleep at work.  She does not fall asleep while driving.  She denies any cataplexy events.  She returns today for evaluation.  HISTORY  06 October 2017 (copied from Dr. Edwena Felty note): Amanda King was last seen by nurse practitioner Maxmilian Trostel m 6 months ago.  She is here for routine refill of Ritalin and Tofranil to medications at control narcolepsy and cataplexy.  She has tolerated the medication very well, she has kept physically active she still is working part-time, she reports less frequent dreams and last visit dreams on these medications.  Her Epworth sleepiness score was reduced to 10 points and her fatigue severity is only certain points, the geriatric depression score was endorsed at 0 out of 15 points.  I will refill today Ritalin to 20 mg tablets, taken 2-3 / day.  I gave her 180 # supply .   REVIEW OF SYSTEMS: Out of a complete 14 system review of symptoms, the patient complains only of the following symptoms, and all other reviewed systems are negative.  See HPI  ALLERGIES: Allergies  Allergen Reactions  . Penicillins     REACTION: GI upset    HOME MEDICATIONS: Outpatient Medications Prior to Visit  Medication Sig Dispense Refill  . aspirin 81 MG tablet Take 81 mg by mouth daily.      . Blood Glucose Monitoring Suppl (BLOOD GLUCOSE MONITOR KIT) KIT Check blood sugar once daily as needed for DM     . cetirizine (ZYRTEC) 10 MG tablet Take 10 mg by mouth daily.    Marland Kitchen  glipiZIDE (GLUCOTROL XL) 10 MG 24 hr tablet Take 1 tablet (10 mg total) by mouth daily with breakfast. 90 tablet 3  . glucose blood test strip Check blood sugar once daily as needed for DM.     . hydrochlorothiazide (HYDRODIURIL) 25 MG tablet Take 1 tablet (25 mg total) by mouth daily. 90 tablet 3  . ibuprofen (ADVIL,MOTRIN) 200 MG tablet Take 200 mg by mouth every 6 (six) hours as needed.      Marland Kitchen imipramine (TOFRANIL) 50 MG tablet Take 1 tablet (50 mg total) by mouth at bedtime. 90 tablet 3  . levothyroxine (SYNTHROID, LEVOTHROID) 88 MCG tablet Take 1 tablet (88 mcg total) by mouth daily. 90 tablet 3  . lisinopril (PRINIVIL,ZESTRIL) 20 MG tablet TAKE 1 TABLET BY MOUTH ONCE DAILY 90 tablet 1  . metFORMIN (GLUCOPHAGE-XR) 750 MG 24 hr tablet Take 1 tablet (750 mg total) by mouth daily with breakfast. 90 tablet 3  . methylphenidate (RITALIN) 20 MG tablet Take 1 tablet (20 mg total) by mouth 3 (three) times daily with meals. 180 tablet 0  . simvastatin (ZOCOR) 80 MG tablet Take 1 tablet (80 mg total) by mouth at bedtime. 90 tablet 3  . sitaGLIPtin (JANUVIA) 100 MG tablet Take 1 tablet (100 mg total) by mouth daily. 90 tablet 0   No facility-administered medications prior to visit.  PAST MEDICAL HISTORY: Past Medical History:  Diagnosis Date  . Diabetes mellitus    Type II  . Hyperlipidemia   . Hypertension   . Hypothyroidism   . Migraine   . Narcolepsy with cataplexy   . Pneumonia 08/2001    PAST SURGICAL HISTORY: Past Surgical History:  Procedure Laterality Date  . APPENDECTOMY  age 55  . hemilaminectomy     C5-6; C6-7  . PARTIAL HYSTERECTOMY  age 60  . POSTERIOR CERVICAL FUSION/FORAMINOTOMY  02/05/1975   C5-C6-C7    FAMILY HISTORY: Family History  Problem Relation Age of Onset  . Depression Brother        suicide  . Diabetes Brother   . Heart disease Mother        MI  . Peripheral vascular disease Mother   . Hyperlipidemia Mother   . Diabetes Brother   . Sleep apnea Son    . Sleep apnea Brother     SOCIAL HISTORY: Social History   Socioeconomic History  . Marital status: Widowed    Spouse name: Not on file  . Number of children: 3  . Years of education: GED  . Highest education level: Not on file  Occupational History    Employer: [employer]  Social Needs  . Financial resource strain: Not on file  . Food insecurity:    Worry: Not on file    Inability: Not on file  . Transportation needs:    Medical: Not on file    Non-medical: Not on file  Tobacco Use  . Smoking status: Former Smoker    Last attempt to quit: 06/09/1980    Years since quitting: 37.8  . Smokeless tobacco: Never Used  Substance and Sexual Activity  . Alcohol use: No    Alcohol/week: 0.0 standard drinks  . Drug use: No  . Sexual activity: Never  Lifestyle  . Physical activity:    Days per week: Not on file    Minutes per session: Not on file  . Stress: Not on file  Relationships  . Social connections:    Talks on phone: Not on file    Gets together: Not on file    Attends religious service: Not on file    Active member of club or organization: Not on file    Attends meetings of clubs or organizations: Not on file    Relationship status: Not on file  . Intimate partner violence:    Fear of current or ex partner: Not on file    Emotionally abused: Not on file    Physically abused: Not on file    Forced sexual activity: Not on file  Other Topics Concern  . Not on file  Social History Narrative   Patient is widowed. and her son lives with her.   Walks for exercise.     Patient has three children.   Patient is right-handed.   Patient drinks three cups of caffeine daily.     Patient is retired.   Patient has a GED.      PHYSICAL EXAM  Vitals:   04/14/18 1416  BP: (!) 149/73  Pulse: (!) 101  Weight: 161 lb 9.6 oz (73.3 kg)  Height: 5' 1.5" (1.562 m)   Body mass index is 30.04 kg/m.  Generalized: Well developed, in no acute distress    Neurological examination  Mentation: Alert oriented to time, place, history taking. Follows all commands speech and language fluent Cranial nerve II-XII: Pupils were equal round reactive to light.  Extraocular movements were full, visual field were full on confrontational test. Facial sensation and strength were normal. Uvula tongue midline. Head turning and shoulder shrug  were normal and symmetric. Motor: The motor testing reveals 5 over 5 strength of all 4 extremities. Good symmetric motor tone is noted throughout.  Sensory: Sensory testing is intact to soft touch on all 4 extremities. No evidence of extinction is noted.  Coordination: Cerebellar testing reveals good finger-nose-finger and heel-to-shin bilaterally.  Gait and station: Gait is normal.  Reflexes: Deep tendon reflexes are symmetric and normal bilaterally.   DIAGNOSTIC DATA (LABS, IMAGING, TESTING) - I reviewed patient records, labs, notes, testing and imaging myself where available.  Lab Results  Component Value Date   WBC 7.3 03/09/2018   HGB 12.8 03/09/2018   HCT 37.9 03/09/2018   MCV 88.6 03/09/2018   PLT 254.0 03/09/2018      Component Value Date/Time   NA 137 03/09/2018 1036   K 4.4 03/09/2018 1036   CL 99 03/09/2018 1036   CO2 32 03/09/2018 1036   GLUCOSE 159 (H) 03/09/2018 1036   BUN 21 03/09/2018 1036   CREATININE 1.05 03/09/2018 1036   CALCIUM 9.4 03/09/2018 1036   PROT 7.3 03/09/2018 1036   ALBUMIN 4.4 03/09/2018 1036   AST 17 03/09/2018 1036   ALT 24 03/09/2018 1036   ALKPHOS 93 03/09/2018 1036   BILITOT 0.4 03/09/2018 1036   GFRNONAA 70.16 06/06/2010 0954   GFRAA 92 03/02/2008 1037   Lab Results  Component Value Date   CHOL 188 03/09/2018   HDL 39.40 03/09/2018   LDLCALC 73 06/21/2014   LDLDIRECT 94.0 03/09/2018   TRIG 357.0 (H) 03/09/2018   CHOLHDL 5 03/09/2018   Lab Results  Component Value Date   HGBA1C 8.0 (H) 03/09/2018    Lab Results  Component Value Date   TSH 1.34  03/09/2018      ASSESSMENT AND PLAN 77 y.o. year old female  has a past medical history of Diabetes mellitus, Hyperlipidemia, Hypertension, Hypothyroidism, Migraine, Narcolepsy with cataplexy, and Pneumonia (08/2001). here with :  1.  Narcolepsy with cataplexy  The patient will continue on Ritalin and Tofranil.  She will continue to monitor blood pressure and heart rate.  She is advised that if her symptoms worsen or she develops new symptoms she should let us know.  She will follow-up in 6 months or sooner if needed  I spent 15 minutes with the patient. 50% of this time was spent reviewing her plan of care   Ward Givens, MSN, NP-C 04/14/2018, 2:16 PM [facility] [address], [place], [place] [address] [phone]

## 2018-05-09 DIAGNOSIS — Z8719 Personal history of other diseases of the digestive system: Secondary | ICD-10-CM

## 2018-05-09 HISTORY — DX: Personal history of other diseases of the digestive system: Z87.19

## 2018-05-17 ENCOUNTER — Inpatient Hospital Stay (HOSPITAL_COMMUNITY)
Admission: EM | Admit: 2018-05-17 | Discharge: 2018-05-24 | DRG: 394 | Disposition: A | Discharge status: Home Health | Payer: Medicare Other | Attending: Internal Medicine | Admitting: Internal Medicine

## 2018-05-17 ENCOUNTER — Other Ambulatory Visit: Payer: Self-pay

## 2018-05-17 ENCOUNTER — Emergency Department (HOSPITAL_COMMUNITY): Payer: Medicare Other

## 2018-05-17 ENCOUNTER — Encounter (HOSPITAL_COMMUNITY): Payer: Self-pay | Admitting: Emergency Medicine

## 2018-05-17 DIAGNOSIS — K5900 Constipation, unspecified: Secondary | ICD-10-CM | POA: Diagnosis not present

## 2018-05-17 DIAGNOSIS — Z79899 Other long term (current) drug therapy: Secondary | ICD-10-CM

## 2018-05-17 DIAGNOSIS — A419 Sepsis, unspecified organism: Secondary | ICD-10-CM | POA: Diagnosis not present

## 2018-05-17 DIAGNOSIS — K8020 Calculus of gallbladder without cholecystitis without obstruction: Secondary | ICD-10-CM | POA: Diagnosis not present

## 2018-05-17 DIAGNOSIS — Z833 Family history of diabetes mellitus: Secondary | ICD-10-CM

## 2018-05-17 DIAGNOSIS — E1165 Type 2 diabetes mellitus with hyperglycemia: Secondary | ICD-10-CM | POA: Diagnosis present

## 2018-05-17 DIAGNOSIS — R339 Retention of urine, unspecified: Secondary | ICD-10-CM | POA: Diagnosis not present

## 2018-05-17 DIAGNOSIS — D649 Anemia, unspecified: Secondary | ICD-10-CM | POA: Diagnosis present

## 2018-05-17 DIAGNOSIS — IMO0002 Reserved for concepts with insufficient information to code with codable children: Secondary | ICD-10-CM | POA: Diagnosis present

## 2018-05-17 DIAGNOSIS — Z7984 Long term (current) use of oral hypoglycemic drugs: Secondary | ICD-10-CM | POA: Diagnosis not present

## 2018-05-17 DIAGNOSIS — E872 Acidosis: Secondary | ICD-10-CM | POA: Diagnosis not present

## 2018-05-17 DIAGNOSIS — K529 Noninfective gastroenteritis and colitis, unspecified: Secondary | ICD-10-CM | POA: Diagnosis not present

## 2018-05-17 DIAGNOSIS — Z880 Allergy status to penicillin: Secondary | ICD-10-CM

## 2018-05-17 DIAGNOSIS — K633 Ulcer of intestine: Secondary | ICD-10-CM | POA: Diagnosis not present

## 2018-05-17 DIAGNOSIS — I4510 Unspecified right bundle-branch block: Secondary | ICD-10-CM | POA: Diagnosis not present

## 2018-05-17 DIAGNOSIS — K319 Disease of stomach and duodenum, unspecified: Secondary | ICD-10-CM | POA: Diagnosis not present

## 2018-05-17 DIAGNOSIS — R140 Abdominal distension (gaseous): Secondary | ICD-10-CM | POA: Diagnosis not present

## 2018-05-17 DIAGNOSIS — G47411 Narcolepsy with cataplexy: Secondary | ICD-10-CM | POA: Diagnosis not present

## 2018-05-17 DIAGNOSIS — R062 Wheezing: Secondary | ICD-10-CM | POA: Diagnosis not present

## 2018-05-17 DIAGNOSIS — Z8349 Family history of other endocrine, nutritional and metabolic diseases: Secondary | ICD-10-CM

## 2018-05-17 DIAGNOSIS — Z8719 Personal history of other diseases of the digestive system: Secondary | ICD-10-CM

## 2018-05-17 DIAGNOSIS — K559 Vascular disorder of intestine, unspecified: Secondary | ICD-10-CM | POA: Diagnosis not present

## 2018-05-17 DIAGNOSIS — K599 Functional intestinal disorder, unspecified: Secondary | ICD-10-CM | POA: Diagnosis not present

## 2018-05-17 DIAGNOSIS — Z981 Arthrodesis status: Secondary | ICD-10-CM

## 2018-05-17 DIAGNOSIS — N183 Chronic kidney disease, stage 3 (moderate): Secondary | ICD-10-CM | POA: Diagnosis present

## 2018-05-17 DIAGNOSIS — Z66 Do not resuscitate: Secondary | ICD-10-CM | POA: Diagnosis present

## 2018-05-17 DIAGNOSIS — E039 Hypothyroidism, unspecified: Secondary | ICD-10-CM | POA: Diagnosis present

## 2018-05-17 DIAGNOSIS — R748 Abnormal levels of other serum enzymes: Secondary | ICD-10-CM | POA: Diagnosis not present

## 2018-05-17 DIAGNOSIS — Z7982 Long term (current) use of aspirin: Secondary | ICD-10-CM | POA: Diagnosis not present

## 2018-05-17 DIAGNOSIS — K641 Second degree hemorrhoids: Secondary | ICD-10-CM | POA: Diagnosis present

## 2018-05-17 DIAGNOSIS — N179 Acute kidney failure, unspecified: Secondary | ICD-10-CM | POA: Diagnosis not present

## 2018-05-17 DIAGNOSIS — K5901 Slow transit constipation: Secondary | ICD-10-CM | POA: Diagnosis not present

## 2018-05-17 DIAGNOSIS — E1169 Type 2 diabetes mellitus with other specified complication: Secondary | ICD-10-CM | POA: Diagnosis present

## 2018-05-17 DIAGNOSIS — Z79890 Hormone replacement therapy (postmenopausal): Secondary | ICD-10-CM

## 2018-05-17 DIAGNOSIS — E1122 Type 2 diabetes mellitus with diabetic chronic kidney disease: Secondary | ICD-10-CM | POA: Diagnosis present

## 2018-05-17 DIAGNOSIS — E860 Dehydration: Secondary | ICD-10-CM | POA: Diagnosis not present

## 2018-05-17 DIAGNOSIS — I10 Essential (primary) hypertension: Secondary | ICD-10-CM | POA: Diagnosis present

## 2018-05-17 DIAGNOSIS — K644 Residual hemorrhoidal skin tags: Secondary | ICD-10-CM | POA: Diagnosis not present

## 2018-05-17 DIAGNOSIS — R0902 Hypoxemia: Secondary | ICD-10-CM | POA: Diagnosis not present

## 2018-05-17 DIAGNOSIS — I129 Hypertensive chronic kidney disease with stage 1 through stage 4 chronic kidney disease, or unspecified chronic kidney disease: Secondary | ICD-10-CM | POA: Diagnosis not present

## 2018-05-17 DIAGNOSIS — E785 Hyperlipidemia, unspecified: Secondary | ICD-10-CM | POA: Diagnosis present

## 2018-05-17 DIAGNOSIS — K760 Fatty (change of) liver, not elsewhere classified: Secondary | ICD-10-CM | POA: Diagnosis not present

## 2018-05-17 DIAGNOSIS — Z8249 Family history of ischemic heart disease and other diseases of the circulatory system: Secondary | ICD-10-CM

## 2018-05-17 DIAGNOSIS — Z87891 Personal history of nicotine dependence: Secondary | ICD-10-CM

## 2018-05-17 DIAGNOSIS — K567 Ileus, unspecified: Secondary | ICD-10-CM | POA: Diagnosis not present

## 2018-05-17 DIAGNOSIS — K5730 Diverticulosis of large intestine without perforation or abscess without bleeding: Secondary | ICD-10-CM | POA: Diagnosis present

## 2018-05-17 DIAGNOSIS — R933 Abnormal findings on diagnostic imaging of other parts of digestive tract: Secondary | ICD-10-CM | POA: Diagnosis not present

## 2018-05-17 DIAGNOSIS — R1084 Generalized abdominal pain: Secondary | ICD-10-CM | POA: Diagnosis not present

## 2018-05-17 LAB — GLUCOSE, CAPILLARY
Glucose-Capillary: 229 mg/dL — ABNORMAL HIGH (ref 70–99)
Glucose-Capillary: 190 mg/dL — ABNORMAL HIGH (ref 70–99)
Glucose-Capillary: 181 mg/dL — ABNORMAL HIGH (ref 70–99)

## 2018-05-17 LAB — URINALYSIS, ROUTINE W REFLEX MICROSCOPIC
Bilirubin Urine: NEGATIVE
Glucose, UA: 150 mg/dL — AB
Hgb urine dipstick: NEGATIVE
Ketones, ur: NEGATIVE mg/dL
Nitrite: NEGATIVE
Protein, ur: NEGATIVE mg/dL
Specific Gravity, Urine: 1.025 (ref 1.005–1.030)
pH: 5 (ref 5.0–8.0)

## 2018-05-17 LAB — I-STAT TROPONIN, ED: TROPONIN I, POC: 0 ng/mL (ref 0.00–0.08)

## 2018-05-17 LAB — COMPREHENSIVE METABOLIC PANEL
ALK PHOS: 81 U/L (ref 38–126)
ALT: 30 U/L (ref 0–44)
AST: 24 U/L (ref 15–41)
Albumin: 4.3 g/dL (ref 3.5–5.0)
Anion gap: 10 (ref 5–15)
BUN: 24 mg/dL — ABNORMAL HIGH (ref 8–23)
CALCIUM: 8.6 mg/dL — AB (ref 8.9–10.3)
CO2: 30 mmol/L (ref 22–32)
Chloride: 96 mmol/L — ABNORMAL LOW (ref 98–111)
Creatinine, Ser: 1.24 mg/dL — ABNORMAL HIGH (ref 0.44–1.00)
GFR calc Af Amer: 48 mL/min — ABNORMAL LOW (ref >60)
GFR calc non Af Amer: 42 mL/min — ABNORMAL LOW (ref >60)
Glucose, Bld: 276 mg/dL — ABNORMAL HIGH (ref 70–99)
Potassium: 3.8 mmol/L (ref 3.5–5.1)
Sodium: 136 mmol/L (ref 135–145)
Total Bilirubin: 0.5 mg/dL (ref 0.3–1.2)
Total Protein: 6.9 g/dL (ref 6.5–8.1)

## 2018-05-17 LAB — CBC
HCT: 43.3 % (ref 36.0–46.0)
Hemoglobin: 14 g/dL (ref 12.0–15.0)
MCH: 29.7 pg (ref 26.0–34.0)
MCHC: 32.3 g/dL (ref 30.0–36.0)
MCV: 91.7 fL (ref 80.0–100.0)
Platelets: 296 10*3/uL (ref 150–400)
RBC: 4.72 MIL/uL (ref 3.87–5.11)
RDW: 12.1 % (ref 11.5–15.5)
WBC: 17.8 10*3/uL — ABNORMAL HIGH (ref 4.0–10.5)
nRBC: 0 % (ref 0.0–0.2)

## 2018-05-17 LAB — I-STAT CG4 LACTIC ACID, ED
Lactic Acid, Venous: 2.43 mmol/L (ref 0.5–1.9)
Lactic Acid, Venous: 2.27 mmol/L (ref 0.5–1.9)

## 2018-05-17 LAB — LACTIC ACID, PLASMA
Lactic Acid, Venous: 3.2 mmol/L (ref 0.5–1.9)
Lactic Acid, Venous: 2.7 mmol/L (ref 0.5–1.9)

## 2018-05-17 LAB — LIPASE, BLOOD: Lipase: 54 U/L — ABNORMAL HIGH (ref 11–51)

## 2018-05-17 MED ORDER — LEVOTHYROXINE SODIUM 88 MCG PO TABS
88.0000 ug | ORAL_TABLET | Freq: Every day | ORAL | Status: DC
  Administered 2018-05-17 – 2018-05-18 (×2): 88 ug via ORAL
  Filled 2018-05-17 (×2): qty 1

## 2018-05-17 MED ORDER — IOPAMIDOL (ISOVUE-370) INJECTION 76%
INTRAVENOUS | Status: AC
  Filled 2018-05-17: qty 100

## 2018-05-17 MED ORDER — SODIUM CHLORIDE 0.9 % IV BOLUS
1000.0000 mL | Freq: Once | INTRAVENOUS | Status: AC
  Administered 2018-05-17: 1000 mL via INTRAVENOUS

## 2018-05-17 MED ORDER — INSULIN ASPART 100 UNIT/ML ~~LOC~~ SOLN: 0.0000 [IU] | Freq: Every day | SUBCUTANEOUS | Status: DC

## 2018-05-17 MED ORDER — ORAL CARE MOUTH RINSE
15.0000 mL | Freq: Two times a day (BID) | OROMUCOSAL | Status: DC
  Administered 2018-05-17 – 2018-05-23 (×8): 15 mL via OROMUCOSAL

## 2018-05-17 MED ORDER — ATORVASTATIN CALCIUM 40 MG PO TABS
40.0000 mg | ORAL_TABLET | Freq: Every day | ORAL | Status: DC
  Administered 2018-05-17: 40 mg via ORAL
  Filled 2018-05-17: qty 1

## 2018-05-17 MED ORDER — SODIUM CHLORIDE 0.9 % IV SOLN: 2.0000 g | Freq: Once | INTRAVENOUS | Status: DC

## 2018-05-17 MED ORDER — ONDANSETRON HCL 4 MG/2ML IJ SOLN
4.0000 mg | Freq: Once | INTRAMUSCULAR | Status: AC
  Administered 2018-05-17: 4 mg via INTRAVENOUS
  Filled 2018-05-17: qty 2

## 2018-05-17 MED ORDER — HYDROMORPHONE HCL 1 MG/ML IJ SOLN
1.0000 mg | Freq: Once | INTRAMUSCULAR | Status: AC
  Administered 2018-05-17: 1 mg via INTRAVENOUS
  Filled 2018-05-17: qty 1

## 2018-05-17 MED ORDER — IMIPRAMINE HCL 50 MG PO TABS
50.0000 mg | ORAL_TABLET | Freq: Every day | ORAL | Status: DC
  Administered 2018-05-17 – 2018-05-18 (×2): 50 mg via ORAL
  Filled 2018-05-17 (×3): qty 1

## 2018-05-17 MED ORDER — LORATADINE 10 MG PO TABS
10.0000 mg | ORAL_TABLET | Freq: Every day | ORAL | Status: DC
  Administered 2018-05-17: 10 mg via ORAL
  Filled 2018-05-17: qty 1

## 2018-05-17 MED ORDER — ONDANSETRON HCL 4 MG/2ML IJ SOLN
4.0000 mg | Freq: Four times a day (QID) | INTRAMUSCULAR | Status: DC | PRN
  Administered 2018-05-17 – 2018-05-18 (×3): 4 mg via INTRAVENOUS
  Filled 2018-05-17 (×3): qty 2

## 2018-05-17 MED ORDER — ONDANSETRON HCL 4 MG PO TABS: 4.0000 mg | ORAL_TABLET | Freq: Four times a day (QID) | ORAL | Status: DC | PRN

## 2018-05-17 MED ORDER — CIPROFLOXACIN IN D5W 400 MG/200ML IV SOLN
400.0000 mg | Freq: Two times a day (BID) | INTRAVENOUS | Status: DC
  Administered 2018-05-17 – 2018-05-20 (×8): 400 mg via INTRAVENOUS
  Filled 2018-05-17 (×8): qty 200

## 2018-05-17 MED ORDER — INSULIN ASPART 100 UNIT/ML ~~LOC~~ SOLN
0.0000 [IU] | Freq: Three times a day (TID) | SUBCUTANEOUS | Status: DC
  Administered 2018-05-17: 5 [IU] via SUBCUTANEOUS
  Administered 2018-05-17: 3 [IU] via SUBCUTANEOUS
  Administered 2018-05-18: 5 [IU] via SUBCUTANEOUS

## 2018-05-17 MED ORDER — SODIUM CHLORIDE 0.9 % IV SOLN
INTRAVENOUS | Status: DC
  Administered 2018-05-17: 07:00:00 via INTRAVENOUS

## 2018-05-17 MED ORDER — LEVOFLOXACIN IN D5W 750 MG/150ML IV SOLN
750.0000 mg | Freq: Once | INTRAVENOUS | Status: DC
  Administered 2018-05-17: 750 mg via INTRAVENOUS
  Filled 2018-05-17: qty 150

## 2018-05-17 MED ORDER — ENOXAPARIN SODIUM 40 MG/0.4ML ~~LOC~~ SOLN
40.0000 mg | SUBCUTANEOUS | Status: DC
  Administered 2018-05-17 – 2018-05-23 (×7): 40 mg via SUBCUTANEOUS
  Filled 2018-05-17 (×7): qty 0.4

## 2018-05-17 MED ORDER — HYDROMORPHONE HCL 1 MG/ML IJ SOLN
1.0000 mg | Freq: Once | INTRAMUSCULAR | Status: DC
  Filled 2018-05-17: qty 1

## 2018-05-17 MED ORDER — ASPIRIN EC 81 MG PO TBEC
81.0000 mg | DELAYED_RELEASE_TABLET | Freq: Every day | ORAL | Status: DC
  Administered 2018-05-17: 81 mg via ORAL
  Filled 2018-05-17: qty 1

## 2018-05-17 MED ORDER — MORPHINE SULFATE (PF) 2 MG/ML IV SOLN
2.0000 mg | INTRAVENOUS | Status: DC | PRN
  Administered 2018-05-17 (×3): 2 mg via INTRAVENOUS
  Filled 2018-05-17 (×3): qty 1

## 2018-05-17 MED ORDER — TRAZODONE HCL 50 MG PO TABS
50.0000 mg | ORAL_TABLET | Freq: Once | ORAL | Status: AC
  Administered 2018-05-18: 50 mg via ORAL
  Filled 2018-05-17: qty 1

## 2018-05-17 MED ORDER — FENTANYL CITRATE (PF) 100 MCG/2ML IJ SOLN: 12.5000 ug | INTRAMUSCULAR | Status: DC | PRN

## 2018-05-17 MED ORDER — METRONIDAZOLE IN NACL 5-0.79 MG/ML-% IV SOLN
500.0000 mg | Freq: Three times a day (TID) | INTRAVENOUS | Status: DC
  Administered 2018-05-17 – 2018-05-21 (×13): 500 mg via INTRAVENOUS
  Filled 2018-05-17 (×13): qty 100

## 2018-05-17 MED ORDER — ACETAMINOPHEN 650 MG RE SUPP: 650.0000 mg | Freq: Four times a day (QID) | RECTAL | Status: DC | PRN

## 2018-05-17 MED ORDER — SODIUM CHLORIDE 0.9 % IV SOLN
INTRAVENOUS | Status: DC
  Administered 2018-05-17 – 2018-05-18 (×3): via INTRAVENOUS

## 2018-05-17 MED ORDER — ACETAMINOPHEN 325 MG PO TABS
650.0000 mg | ORAL_TABLET | Freq: Four times a day (QID) | ORAL | Status: DC | PRN
  Administered 2018-05-18 – 2018-05-23 (×4): 650 mg via ORAL
  Filled 2018-05-17 (×6): qty 2

## 2018-05-17 MED ORDER — IOPAMIDOL (ISOVUE-370) INJECTION 76%
80.0000 mL | Freq: Once | INTRAVENOUS | Status: AC | PRN
  Administered 2018-05-17: 80 mL via INTRAVENOUS

## 2018-05-17 MED ORDER — ONDANSETRON HCL 4 MG/2ML IJ SOLN: 4.0000 mg | Freq: Four times a day (QID) | INTRAMUSCULAR | Status: DC | PRN

## 2018-05-17 MED ORDER — LISINOPRIL 20 MG PO TABS
20.0000 mg | ORAL_TABLET | Freq: Every day | ORAL | Status: DC
  Administered 2018-05-17: 20 mg via ORAL
  Filled 2018-05-17: qty 1

## 2018-05-17 MED ORDER — HYDROMORPHONE HCL 1 MG/ML IJ SOLN
1.0000 mg | Freq: Once | INTRAMUSCULAR | Status: AC
  Administered 2018-05-17: 1 mg via INTRAVENOUS

## 2018-05-17 NOTE — ED Notes (Signed)
ED Provider at bedside. 

## 2018-05-17 NOTE — ED Notes (Signed)
Dr Eudelia Bunchardama informed of lactic acid results 2.27

## 2018-05-17 NOTE — ED Triage Notes (Signed)
[id number] Pines PWyTrudieMarthann SchillerucklerHospitalBayou Regio37[facility] [id number](94EstrMarlAmMaple Hudso e Filteron3717SAntoine Poche CoThedacare Medical Center New [phone] 545-JenelChief of Staff Millinerlic [phone]

## 2018-05-17 NOTE — Progress Notes (Signed)
Assumed care on pt. , pt. resting with no distress/respirations unlabored , IV site intact , Cipro IV antibiotic infusing , denies pain at this time .

## 2018-05-17 NOTE — Consult Note (Signed)
[facility] Surgery Consult/Admission Note  Amanda King 03/04/1941  [id number].    Requesting MD: Dr. Ophelia Charter Chief Complaint/Reason for Consult: abdominal pain  HPI:   Pt is a 77 yo female with a hx of narcolepsy, hypothyroidism, HTN, HLD and DM who presented to the ED with complaints of abdominal pain onset last night. She states the pain woke her from her sleep around 2200. Pain is intermittent, severe, non radiating, improves after urination, and pain medicine helped. She is currently without pain. Associated nausea and bloating. Pt states she was feeling fine and her normal health when she went to bed. No sick contacts. She ate hotdogs on Saturday but her son did also and he is not sick. No fever, chills, vomiting. Her stools have been normal and without blood or melena. She confirms she is still having flatus since onset of pain. She states she has had a colonoscopy in the past and there was no abnormal findings. Daughter at bedside. CTA showed colitis and we were asked to see.  ROS:  Review of Systems  Constitutional: Negative for chills, diaphoresis and fever.  HENT: Negative for sore throat.   Respiratory: Negative for cough and shortness of breath.   Cardiovascular: Negative for chest pain.  Gastrointestinal: Positive for abdominal pain and nausea. Negative for blood in stool, constipation, diarrhea, melena and vomiting.  Genitourinary: Negative for dysuria.  Skin: Negative for rash.  Neurological: Negative for dizziness and loss of consciousness.  All other systems reviewed and are negative.    Family History  Problem Relation Age of Onset  . Depression Brother        suicide  . Diabetes Brother   . Heart disease Mother        MI  . Peripheral vascular disease Mother   . Hyperlipidemia Mother   . Diabetes Brother   . Sleep apnea Son   . Sleep apnea Brother     Past Medical History:  Diagnosis Date  . Diabetes mellitus    Type II  . Hyperlipidemia   .  Hypertension   . Hypothyroidism   . Migraine   . Narcolepsy with cataplexy   . Pneumonia 08/2001    Past Surgical History:  Procedure Laterality Date  . APPENDECTOMY  age 11  . hemilaminectomy     C5-6; C6-7  . PARTIAL HYSTERECTOMY  age 64  . POSTERIOR CERVICAL FUSION/FORAMINOTOMY  02/05/1975   C5-C6-C7    Social History:  reports that she quit smoking about 37 years ago. She has never used smokeless tobacco. She reports that she does not drink alcohol or use drugs.  Allergies:  Allergies  Allergen Reactions  . Penicillins     REACTION: GI upset    Medications Prior to Admission  Medication Sig Dispense Refill  . glipiZIDE (GLUCOTROL XL) 10 MG 24 hr tablet Take 1 tablet (10 mg total) by mouth daily with breakfast. 90 tablet 3  . hydrochlorothiazide (HYDRODIURIL) 25 MG tablet Take 1 tablet (25 mg total) by mouth daily. 90 tablet 3  . ibuprofen (ADVIL,MOTRIN) 200 MG tablet Take 200 mg by mouth every 6 (six) hours as needed for headache or mild pain.     Marland Kitchen imipramine (TOFRANIL) 50 MG tablet Take 1 tablet (50 mg total) by mouth at bedtime. 90 tablet 3  . levothyroxine (SYNTHROID, LEVOTHROID) 88 MCG tablet Take 1 tablet (88 mcg total) by mouth daily. 90 tablet 3  . lisinopril (PRINIVIL,ZESTRIL) 20 MG tablet TAKE 1 TABLET BY MOUTH ONCE DAILY  90 tablet 1  . metFORMIN (GLUCOPHAGE-XR) 750 MG 24 hr tablet Take 1 tablet (750 mg total) by mouth daily with breakfast. 90 tablet 3  . methylphenidate (RITALIN) 20 MG tablet Take 1 tablet (20 mg total) by mouth 3 (three) times daily with meals. 180 tablet 0  . simvastatin (ZOCOR) 80 MG tablet Take 1 tablet (80 mg total) by mouth at bedtime. 90 tablet 3  . sitaGLIPtin (JANUVIA) 100 MG tablet Take 1 tablet (100 mg total) by mouth daily. 90 tablet 0  . aspirin 81 MG tablet Take 81 mg by mouth daily.      . cetirizine (ZYRTEC) 10 MG tablet Take 10 mg by mouth daily.      Blood pressure 139/69, pulse (!) 114, temperature 97.6 F (36.4 C),  temperature source Oral, resp. rate 20, SpO2 96 %.  Physical Exam  Constitutional: She is oriented to person, place, and time. She appears well-developed and well-nourished.  Non-toxic appearance. She does not appear ill. No distress.  HENT:  Head: Normocephalic and atraumatic.  Nose: Nose normal.  Mouth/Throat: Oropharynx is clear and moist and mucous membranes are normal. No oropharyngeal exudate.  Eyes: Pupils are equal, round, and reactive to light. Conjunctivae are normal. Right eye exhibits no discharge. Left eye exhibits no discharge. No scleral icterus.  Neck: Normal range of motion. Neck supple. No thyromegaly present.  Cardiovascular: Regular rhythm, normal heart sounds and intact distal pulses. Tachycardia present.  No murmur heard. Pulses:      Radial pulses are 2+ on the right side, and 2+ on the left side.       Posterior tibial pulses are 2+ on the right side, and 2+ on the left side.  Pulmonary/Chest: Effort normal and breath sounds normal. No respiratory distress. She has no wheezes. She has no rhonchi. She has no rales.  Abdominal: Soft. Normal appearance. She exhibits distension. Bowel sounds are decreased. There is no hepatosplenomegaly. There is generalized tenderness (mild TTP without peritonitis). There is no rigidity and no guarding.  Musculoskeletal: Normal range of motion. She exhibits no edema, tenderness or deformity.  Lymphadenopathy:    She has no cervical adenopathy.  Neurological: She is alert and oriented to person, place, and time.  Skin: Skin is warm and dry. No rash noted. She is not diaphoretic.  Psychiatric: She has a normal mood and affect.  Nursing note and vitals reviewed.   Results for orders placed or performed during the hospital encounter of 05/17/18 (from the past 48 hour(s))  I-stat troponin, ED     Status: None   Collection Time: 05/17/18  1:08 AM  Result Value Ref Range   Troponin i, poc 0.00 0.00 - 0.08 ng/mL   Comment 3             Comment: Due to the release kinetics of cTnI, a negative result within the first hours of the onset of symptoms does not rule out myocardial infarction with certainty. If myocardial infarction is still suspected, repeat the test at appropriate intervals.   Lipase, blood     Status: Abnormal   Collection Time: 05/17/18  1:12 AM  Result Value Ref Range   Lipase 54 (H) 11 - 51 U/L    Comment: Performed at [facility], [address] [place], [place] [address]  Comprehensive metabolic panel     Status: Abnormal   Collection Time: 05/17/18  1:12 AM  Result Value Ref Range   Sodium 136 135 - 145 mmol/L  Potassium 3.8 3.5 - 5.1 mmol/L   Chloride 96 (L) 98 - 111 mmol/L   CO2 30 22 - 32 mmol/L   Glucose, Bld 276 (H) 70 - 99 mg/dL   BUN 24 (H) 8 - 23 mg/dL   Creatinine, Ser 1.611.24 (H) 0.44 - 1.00 mg/dL   Calcium 8.6 (L) 8.9 - 10.3 mg/dL   Total Protein 6.9 6.5 - 8.1 g/dL   Albumin 4.3 3.5 - 5.0 g/dL   AST 24 15 - 41 U/L   ALT 30 0 - 44 U/L   Alkaline Phosphatase 81 38 - 126 U/L   Total Bilirubin 0.5 0.3 - 1.2 mg/dL   GFR calc non Af Amer 42 (L) >60 mL/min   GFR calc Af Amer 48 (L) >60 mL/min   Anion gap 10 5 - 15    Comment: Performed at [facility], [address] [place], [place] [address]  CBC     Status: Abnormal   Collection Time: 05/17/18  1:12 AM  Result Value Ref Range   WBC 17.8 (H) 4.0 - 10.5 K/uL   RBC 4.72 3.87 - 5.11 MIL/uL   Hemoglobin 14.0 12.0 - 15.0 g/dL   HCT 54.043.3 98.136.0 - 19.146.0 %   MCV 91.7 80.0 - 100.0 fL   MCH 29.7 26.0 - 34.0 pg   MCHC 32.3 30.0 - 36.0 g/dL   RDW 47.812.1 29.511.5 - 62.115.5 %   Platelets 296 150 - 400 K/uL   nRBC 0.0 0.0 - 0.2 %    Comment: Performed at [facility], [address] [place], [place] [address]  I-Stat CG4 Lactic Acid, ED     Status: Abnormal   Collection Time: 05/17/18  1:40 AM  Result Value Ref Range   Lactic Acid, Venous 2.43 (HH) 0.5 - 1.9 mmol/L   Comment NOTIFIED PHYSICIAN   Urinalysis, Routine w reflex  microscopic     Status: Abnormal   Collection Time: 05/17/18  3:50 AM  Result Value Ref Range   Color, Urine YELLOW YELLOW   APPearance CLEAR CLEAR   Specific Gravity, Urine 1.025 1.005 - 1.030   pH 5.0 5.0 - 8.0   Glucose, UA 150 (A) NEGATIVE mg/dL   Hgb urine dipstick NEGATIVE NEGATIVE   Bilirubin Urine NEGATIVE NEGATIVE   Ketones, ur NEGATIVE NEGATIVE mg/dL   Protein, ur NEGATIVE NEGATIVE mg/dL   Nitrite NEGATIVE NEGATIVE   Leukocytes, UA TRACE (A) NEGATIVE   WBC, UA 11-20 0 - 5 WBC/hpf   Bacteria, UA RARE (A) NONE SEEN   Squamous Epithelial / LPF 6-10 0 - 5   Mucus PRESENT    Hyaline Casts, UA PRESENT     Comment: Performed at [facility], [address] [place], [place] [address]  I-Stat CG4 Lactic Acid, ED     Status: Abnormal   Collection Time: 05/17/18  4:10 AM  Result Value Ref Range   Lactic Acid, Venous 2.27 (HH) 0.5 - 1.9 mmol/L   Comment NOTIFIED PHYSICIAN    Ct Abdomen Pelvis Wo Contrast  Result Date: 05/17/2018 CLINICAL DATA:  77 year old female with abdominal distention. Concern for bowel obstruction. EXAM: CT ABDOMEN AND PELVIS WITHOUT CONTRAST TECHNIQUE: Multidetector CT imaging of the abdomen and pelvis was performed following the standard protocol without IV contrast. COMPARISON:  Abdominal radiograph dated 05/17/2018 FINDINGS: Evaluation of this exam is limited in the absence of intravenous contrast. Lower chest: There are bibasilar atelectatic changes. No intra-abdominal free air or free fluid. Hepatobiliary: Diffuse  fatty infiltration of the liver. There is a stone in the gallbladder. No pericholecystic fluid or evidence of acute cholecystitis by CT. Pancreas: Unremarkable. No pancreatic ductal dilatation or surrounding inflammatory changes. Spleen: Normal in size without focal abnormality. Adrenals/Urinary Tract: The adrenal glands are unremarkable. Right renal hypodense lesions measure up to 5 cm in the lower pole. The larger lesion demonstrates a  lobulated margin and is not well evaluated on this noncontrast CT. Further evaluation with MRI or ultrasound on a nonemergent basis recommended. Subcentimeter low attenuating focus in the lateral interpolar left kidney appears to demonstrate fat attenuation and may represent a small angiomyolipoma. There is no hydronephrosis or nephrolithiasis on either side. The visualized ureters and urinary bladder appear unremarkable. Stomach/Bowel: There is moderate amount of air and stool throughout the colon. There is no bowel obstruction or active inflammation. The appendix is normal. Vascular/Lymphatic: Advanced aortoiliac atherosclerotic disease. No portal venous gas. There is no adenopathy. Reproductive: Hysterectomy. No pelvic mass. Other: Small fat. Musculoskeletal: Degenerative changes of the spine. No acute osseous pathology. IMPRESSION: 1. Moderate stool and air in the colon. No bowel obstruction or active inflammation. Normal appendix. 2. Fatty liver. 3. Cholelithiasis. 4. Right renal hypodense lesions as described. Further evaluation with MRI or ultrasound on a nonemergent basis recommended. Electronically Signed   By: Elgie Collard M.D.   On: 05/17/2018 02:42   Dg Abd Acute W/chest  Result Date: 05/17/2018 CLINICAL DATA:  77 year old female with abdominal distention. EXAM: DG ABDOMEN ACUTE W/ 1V CHEST COMPARISON:  Abdominal CT dated 05/17/2018 FINDINGS: There is shallow inspiration with bibasilar atelectasis. No focal consolidation, pleural effusion or pneumothorax. Top-normal cardiac size. There is moderate colonic stool burden. No small bowel dilatation or evidence of obstruction. No free air or radiopaque calculi. Degenerative changes of the spine. IMPRESSION: 1. No acute cardiopulmonary process. 2. Moderate colonic stool burden. No bowel obstruction. Electronically Signed   By: Elgie Collard M.D.   On: 05/17/2018 02:22   Ct Angio Abd/pel W And/or Wo Contrast  Result Date: 05/17/2018 CLINICAL  DATA:  77 year old female with abdominal pain and distention. Concern for mesenteric ischemia. EXAM: CTA ABDOMEN AND PELVIS wITHOUT AND WITH CONTRAST TECHNIQUE: Multidetector CT imaging of the abdomen and pelvis was performed using the standard protocol during bolus administration of intravenous contrast. Multiplanar reconstructed images and MIPs were obtained and reviewed to evaluate the vascular anatomy. CONTRAST:  80mL ISOVUE-370 IOPAMIDOL (ISOVUE-370) INJECTION 76% COMPARISON:  CT of the abdomen pelvis dated 05/17/2018 FINDINGS: VASCULAR Aorta: Moderate atherosclerotic disease of the aorta. No aneurysmal dilatation or dissection. Celiac: Patent without evidence of aneurysm, dissection, vasculitis or significant stenosis. SMA: Atherosclerotic calcification of the origin of the SMA. The SMA is widely patent. Renals: Atherosclerotic calcification of the ostia of the left renal artery. The renal arteries are patent. IMA: Patent without evidence of aneurysm, dissection, vasculitis or significant stenosis. Inflow: Patent without evidence of aneurysm, dissection, vasculitis or significant stenosis. Proximal Outflow: Bilateral common femoral and visualized portions of the superficial and profunda femoral arteries are patent without evidence of aneurysm, dissection, vasculitis or significant stenosis. Veins: No obvious venous abnormality within the limitations of this arterial phase study. No portal venous gas. Review of the MIP images confirms the above findings. NON-VASCULAR Lower chest: Bibasilar atelectasis/scarring. No intra-abdominal free air or free fluid. Hepatobiliary: Diffuse fatty infiltration of the liver. A 4.5 x 4.2 cm hypodense lesion in the right lobe of the liver with peripheral enhancement is not well characterized but may represent a hemangioma. Further  characterization with MRI on a nonemergent basis recommended. No intrahepatic biliary ductal dilatation. There is a small stone in the gallbladder. No  pericholecystic fluid. Pancreas: Unremarkable. No pancreatic ductal dilatation or surrounding inflammatory changes. Spleen: Normal in size without focal abnormality. Adrenals/Urinary Tract: The adrenal glands are unremarkable. Multiple right renal cysts measuring up to 5 cm in the inferior pole of the right kidney. There is no hydronephrosis on either side. There is symmetric enhancement of the kidneys bilaterally. The visualized ureters and urinary bladder appear unremarkable. Stomach/Bowel: There is moderate amount of air and stool throughout the colon. There is small sigmoid diverticula without active inflammatory changes. There is thickening of the splenic flexure and to a lesser degree the descending colon with mild surrounding inflammatory changes most consistent with colitis. No pneumatosis. There is no bowel obstruction. The appendix is normal. Lymphatic: No adenopathy. Reproductive: Hysterectomy. No pelvic mass. Other: Small fat containing umbilical hernia. Musculoskeletal: Degenerative changes of the spine. No acute osseous pathology. IMPRESSION: VASCULAR Moderate atherosclerotic disease of the aorta. No aneurysmal dilatation or dissection. No acute or significant arterial occlusion. NON-VASCULAR 1. No CT evidence of mesenteric ischemia. 2. Findings most consistent with colitis involving the splenic flexure and to a lesser degree the descending colon. No bowel obstruction. Normal appendix. 3. Sigmoid diverticulosis without active inflammatory changes. 4. Fatty liver. 5. Indeterminate right hepatic hypodense lesion, likely a hemangioma. Further characterization with MRI on a nonemergent basis recommended. 6. Cholelithiasis. 7. Right renal cysts. No hydronephrosis. Electronically Signed   By: Elgie Collard M.D.   On: 05/17/2018 04:26      Assessment/Plan Principal Problem:   Colitis presumed infectious Active Problems:   Diabetes type 2, uncontrolled (HCC)   Hyperlipidemia LDL goal <100    Essential hypertension   Narcolepsy with cataplexy  Abdominal pain likely 2/2 colitis involving the splenic flexure and proximal descending colon - CTA did not show any indication of ischemia - agree with bowel rest and IV abx - she does not have an acute abdomin and there is no indication for surgical intervention at this time.   FEN: NPO, ice chips, sips VTE: SCD's, lovenox ID: Cipro & Flagyl  Foley: wick Follow up: TBD  Plan: encourage ambulation, we will follow   Jerre Simon, [facility] Surgery 05/17/2018, 8:59 AM Pager: [phone] Consults: [phone] Mon-Fri 7:00 am-4:30 pm Sat-Sun 7:00 am-11:30 am

## 2018-05-17 NOTE — ED Notes (Signed)
Patient transported to X-ray 

## 2018-05-17 NOTE — ED Provider Notes (Addendum)
[facility] EMERGENCY DEPARTMENT Provider Note  CSN: [id number] Arrival date & time: 05/17/18 0047  Chief Complaint(s) Abdominal Pain  HPI Amanda King is a 77 y.o. female with a history listed below including diabetes who presents to the emergency department with approximately 4 to 5 hours of cramping/aching diffuse abdominal pain and bloating.  She reports that she was asymptomatic prior to 10:00.  When she awoke from sleep at that time, she felt the discomfort and thought she had to go to the bathroom.  When she attempted she was unable to have a bowel movement or urinate.  Since then her pain has rapidly worsened.  She is endorsing nausea without emesis.  No fevers or chills.  No cough or congestion.  No chest pain or shortness of breath.  She denies any other urinary symptoms.  HPI  Past Medical History Past Medical History:  Diagnosis Date  . Diabetes mellitus    Type II  . Hyperlipidemia   . Hypertension   . Hypothyroidism   . Migraine   . Narcolepsy with cataplexy   . Pneumonia 08/2001   Patient Active Problem List   Diagnosis Date Noted  . Screening mammogram, encounter for 03/30/2018  . Routine general medical examination at a health care facility 01/31/2016  . Narcolepsy with cataplexy 10/31/2014  . Stress reaction 03/28/2014  . Encounter for Medicare annual wellness exam 09/22/2013  . Hypersomnia, persistent 07/15/2013  . Colon cancer screening 12/31/2011  . Obesity (BMI 30-39.9) 07/01/2011  . ANXIETY 05/06/2010  . DERMATOPHYTOSIS OF NAIL 12/21/2008  . Hypothyroidism 09/02/2006  . Diabetes type 2, uncontrolled ([place]) 09/02/2006  . Hyperlipidemia LDL goal <100 09/02/2006  . NARCOLEPSY W/CATAPLEXY 09/02/2006  . Essential hypertension 09/02/2006  . PNEUMONIA, HX OF 09/02/2006   Home Medication(s) Prior to Admission medications   Medication Sig Start Date End Date Taking? Authorizing Provider  aspirin 81 MG tablet Take 81 mg by mouth daily.       [provider]  Blood Glucose Monitoring Suppl (BLOOD GLUCOSE MONITOR KIT) KIT Check blood sugar once daily as needed for DM     [provider]  cetirizine (ZYRTEC) 10 MG tablet Take 10 mg by mouth daily.    [provider]  glipiZIDE (GLUCOTROL XL) 10 MG 24 hr tablet Take 1 tablet (10 mg total) by mouth daily with breakfast. 03/30/18   Tower, Roque Lias A, MD  glucose blood test strip Check blood sugar once daily as needed for DM.     [provider]  hydrochlorothiazide (HYDRODIURIL) 25 MG tablet Take 1 tablet (25 mg total) by mouth daily. 03/30/18   Tower, Wynelle Fanny, MD  ibuprofen (ADVIL,MOTRIN) 200 MG tablet Take 200 mg by mouth every 6 (six) hours as needed.      [provider]  imipramine (TOFRANIL) 50 MG tablet Take 1 tablet (50 mg total) by mouth at bedtime. 10/06/17   Dohmeier, Asencion Partridge, MD  levothyroxine (SYNTHROID, LEVOTHROID) 88 MCG tablet Take 1 tablet (88 mcg total) by mouth daily. 03/30/18   Tower, Wynelle Fanny, MD  lisinopril (PRINIVIL,ZESTRIL) 20 MG tablet TAKE 1 TABLET BY MOUTH ONCE DAILY 03/16/18   Tower, Wynelle Fanny, MD  metFORMIN (GLUCOPHAGE-XR) 750 MG 24 hr tablet Take 1 tablet (750 mg total) by mouth daily with breakfast. 03/30/18   Tower, Wynelle Fanny, MD  methylphenidate (RITALIN) 20 MG tablet Take 1 tablet (20 mg total) by mouth 3 (three) times daily with meals. 04/14/18   Dohmeier, Asencion Partridge, MD  simvastatin (  ZOCOR) 80 MG tablet Take 1 tablet (80 mg total) by mouth at bedtime. 03/30/18   Tower, Wynelle Fanny, MD  sitaGLIPtin (JANUVIA) 100 MG tablet Take 1 tablet (100 mg total) by mouth daily. 03/30/18   Tower, Wynelle Fanny, MD                                                                                                                                    Past Surgical History Past Surgical History:  Procedure Laterality Date  . APPENDECTOMY  age 11  . hemilaminectomy     C5-6; C6-7  . PARTIAL HYSTERECTOMY  age 77  . POSTERIOR CERVICAL FUSION/FORAMINOTOMY   02/05/1975   C5-C6-C7   Family History Family History  Problem Relation Age of Onset  . Depression Brother        suicide  . Diabetes Brother   . Heart disease Mother        MI  . Peripheral vascular disease Mother   . Hyperlipidemia Mother   . Diabetes Brother   . Sleep apnea Son   . Sleep apnea Brother     Social History Social History   Tobacco Use  . Smoking status: Former Smoker    Last attempt to quit: 06/09/1980    Years since quitting: 37.9  . Smokeless tobacco: Never Used  Substance Use Topics  . Alcohol use: No    Alcohol/week: 0.0 standard drinks  . Drug use: No   Allergies Penicillins  Review of Systems Review of Systems All other systems are reviewed and are negative for acute change except as noted in the HPI  Physical Exam Vital Signs  I have reviewed the triage vital signs BP (!) 146/78   Pulse (!) 104   Temp 98.3 F (36.8 C) (Oral)   Resp 19   SpO2 96%   Physical Exam  Constitutional: She is oriented to person, place, and time. She appears well-developed and well-nourished. No distress.  HENT:  Head: Normocephalic and atraumatic.  Nose: Nose normal.  Eyes: Pupils are equal, round, and reactive to light. Conjunctivae and EOM are normal. Right eye exhibits no discharge. Left eye exhibits no discharge. No scleral icterus.  Neck: Normal range of motion. Neck supple.  Cardiovascular: Normal rate and regular rhythm. Exam reveals no gallop and no friction rub.  No murmur heard. Pulmonary/Chest: Effort normal and breath sounds normal. No stridor. No respiratory distress. She has no rales.  Abdominal: Soft. She exhibits distension. There is generalized tenderness. There is rigidity, rebound and guarding. No hernia.  Musculoskeletal: She exhibits no edema or tenderness.  Neurological: She is alert and oriented to person, place, and time.  Skin: Skin is warm and dry. No rash noted. She is not diaphoretic. No erythema.  Psychiatric: She has a normal  mood and affect.  Vitals reviewed.   ED Results and Treatments Labs (all labs ordered are listed, but only abnormal results are displayed)  Labs Reviewed  LIPASE, BLOOD - Abnormal; Notable for the following components:      Result Value   Lipase 54 (*)    All other components within normal limits  COMPREHENSIVE METABOLIC PANEL - Abnormal; Notable for the following components:   Chloride 96 (*)    Glucose, Bld 276 (*)    BUN 24 (*)    Creatinine, Ser 1.24 (*)    Calcium 8.6 (*)    GFR calc non Af Amer 42 (*)    GFR calc Af Amer 48 (*)    All other components within normal limits  CBC - Abnormal; Notable for the following components:   WBC 17.8 (*)    All other components within normal limits  I-STAT CG4 LACTIC ACID, ED - Abnormal; Notable for the following components:   Lactic Acid, Venous 2.43 (*)    All other components within normal limits  I-STAT CG4 LACTIC ACID, ED - Abnormal; Notable for the following components:   Lactic Acid, Venous 2.27 (*)    All other components within normal limits  CULTURE, BLOOD (ROUTINE X 2)  CULTURE, BLOOD (ROUTINE X 2)  URINALYSIS, ROUTINE W REFLEX MICROSCOPIC  I-STAT TROPONIN, ED                                                                                                                         EKG  EKG Interpretation  Date/Time:    Ventricular Rate:    PR Interval:    QRS Duration:   QT Interval:    QTC Calculation:   R Axis:     Text Interpretation:        Radiology Ct Abdomen Pelvis Wo Contrast  Result Date: 05/17/2018 CLINICAL DATA:  77 year old female with abdominal distention. Concern for bowel obstruction. EXAM: CT ABDOMEN AND PELVIS WITHOUT CONTRAST TECHNIQUE: Multidetector CT imaging of the abdomen and pelvis was performed following the standard protocol without IV contrast. COMPARISON:  Abdominal radiograph dated 05/17/2018 FINDINGS: Evaluation of this exam is limited in the absence of intravenous contrast. Lower chest:  There are bibasilar atelectatic changes. No intra-abdominal free air or free fluid. Hepatobiliary: Diffuse fatty infiltration of the liver. There is a stone in the gallbladder. No pericholecystic fluid or evidence of acute cholecystitis by CT. Pancreas: Unremarkable. No pancreatic ductal dilatation or surrounding inflammatory changes. Spleen: Normal in size without focal abnormality. Adrenals/Urinary Tract: The adrenal glands are unremarkable. Right renal hypodense lesions measure up to 5 cm in the lower pole. The larger lesion demonstrates a lobulated margin and is not well evaluated on this noncontrast CT. Further evaluation with MRI or ultrasound on a nonemergent basis recommended. Subcentimeter low attenuating focus in the lateral interpolar left kidney appears to demonstrate fat attenuation and may represent a small angiomyolipoma. There is no hydronephrosis or nephrolithiasis on either side. The visualized ureters and urinary bladder appear unremarkable. Stomach/Bowel: There is moderate amount of air and stool throughout the colon. There is no bowel obstruction or active inflammation.  The appendix is normal. Vascular/Lymphatic: Advanced aortoiliac atherosclerotic disease. No portal venous gas. There is no adenopathy. Reproductive: Hysterectomy. No pelvic mass. Other: Small fat. Musculoskeletal: Degenerative changes of the spine. No acute osseous pathology. IMPRESSION: 1. Moderate stool and air in the colon. No bowel obstruction or active inflammation. Normal appendix. 2. Fatty liver. 3. Cholelithiasis. 4. Right renal hypodense lesions as described. Further evaluation with MRI or ultrasound on a nonemergent basis recommended. Electronically Signed   By: Anner Crete M.D.   On: 05/17/2018 02:42   Dg Abd Acute W/chest  Result Date: 05/17/2018 CLINICAL DATA:  77 year old female with abdominal distention. EXAM: DG ABDOMEN ACUTE W/ 1V CHEST COMPARISON:  Abdominal CT dated 05/17/2018 FINDINGS: There is  shallow inspiration with bibasilar atelectasis. No focal consolidation, pleural effusion or pneumothorax. Top-normal cardiac size. There is moderate colonic stool burden. No small bowel dilatation or evidence of obstruction. No free air or radiopaque calculi. Degenerative changes of the spine. IMPRESSION: 1. No acute cardiopulmonary process. 2. Moderate colonic stool burden. No bowel obstruction. Electronically Signed   By: Anner Crete M.D.   On: 05/17/2018 02:22   Ct Angio Abd/pel W And/or Wo Contrast  Result Date: 05/17/2018 CLINICAL DATA:  77 year old female with abdominal pain and distention. Concern for mesenteric ischemia. EXAM: CTA ABDOMEN AND PELVIS wITHOUT AND WITH CONTRAST TECHNIQUE: Multidetector CT imaging of the abdomen and pelvis was performed using the standard protocol during bolus administration of intravenous contrast. Multiplanar reconstructed images and MIPs were obtained and reviewed to evaluate the vascular anatomy. CONTRAST:  74m ISOVUE-370 IOPAMIDOL (ISOVUE-370) INJECTION 76% COMPARISON:  CT of the abdomen pelvis dated 05/17/2018 FINDINGS: VASCULAR Aorta: Moderate atherosclerotic disease of the aorta. No aneurysmal dilatation or dissection. Celiac: Patent without evidence of aneurysm, dissection, vasculitis or significant stenosis. SMA: Atherosclerotic calcification of the origin of the SMA. The SMA is widely patent. Renals: Atherosclerotic calcification of the ostia of the left renal artery. The renal arteries are patent. IMA: Patent without evidence of aneurysm, dissection, vasculitis or significant stenosis. Inflow: Patent without evidence of aneurysm, dissection, vasculitis or significant stenosis. Proximal Outflow: Bilateral common femoral and visualized portions of the superficial and profunda femoral arteries are patent without evidence of aneurysm, dissection, vasculitis or significant stenosis. Veins: No obvious venous abnormality within the limitations of this arterial  phase study. No portal venous gas. Review of the MIP images confirms the above findings. NON-VASCULAR Lower chest: Bibasilar atelectasis/scarring. No intra-abdominal free air or free fluid. Hepatobiliary: Diffuse fatty infiltration of the liver. A 4.5 x 4.2 cm hypodense lesion in the right lobe of the liver with peripheral enhancement is not well characterized but may represent a hemangioma. Further characterization with MRI on a nonemergent basis recommended. No intrahepatic biliary ductal dilatation. There is a small stone in the gallbladder. No pericholecystic fluid. Pancreas: Unremarkable. No pancreatic ductal dilatation or surrounding inflammatory changes. Spleen: Normal in size without focal abnormality. Adrenals/Urinary Tract: The adrenal glands are unremarkable. Multiple right renal cysts measuring up to 5 cm in the inferior pole of the right kidney. There is no hydronephrosis on either side. There is symmetric enhancement of the kidneys bilaterally. The visualized ureters and urinary bladder appear unremarkable. Stomach/Bowel: There is moderate amount of air and stool throughout the colon. There is small sigmoid diverticula without active inflammatory changes. There is thickening of the splenic flexure and to a lesser degree the descending colon with mild surrounding inflammatory changes most consistent with colitis. No pneumatosis. There is no bowel obstruction. The appendix is normal.  Lymphatic: No adenopathy. Reproductive: Hysterectomy. No pelvic mass. Other: Small fat containing umbilical hernia. Musculoskeletal: Degenerative changes of the spine. No acute osseous pathology. IMPRESSION: VASCULAR Moderate atherosclerotic disease of the aorta. No aneurysmal dilatation or dissection. No acute or significant arterial occlusion. NON-VASCULAR 1. No CT evidence of mesenteric ischemia. 2. Findings most consistent with colitis involving the splenic flexure and to a lesser degree the descending colon. No bowel  obstruction. Normal appendix. 3. Sigmoid diverticulosis without active inflammatory changes. 4. Fatty liver. 5. Indeterminate right hepatic hypodense lesion, likely a hemangioma. Further characterization with MRI on a nonemergent basis recommended. 6. Cholelithiasis. 7. Right renal cysts. No hydronephrosis. Electronically Signed   By: Anner Crete M.D.   On: 05/17/2018 04:26   Pertinent labs & imaging results that were available during my care of the patient were reviewed by me and considered in my medical decision making (see chart for details).  Medications Ordered in ED Medications  metroNIDAZOLE (FLAGYL) IVPB 500 mg (has no administration in time range)  ondansetron (ZOFRAN) injection 4 mg (has no administration in time range)  cefTRIAXone (ROCEPHIN) 2 g in sodium chloride 0.9 % 100 mL IVPB (has no administration in time range)  sodium chloride 0.9 % bolus 1,000 mL (0 mLs Intravenous Stopped 05/17/18 0309)  HYDROmorphone (DILAUDID) injection 1 mg (1 mg Intravenous Given 05/17/18 0148)  ondansetron (ZOFRAN) injection 4 mg (4 mg Intravenous Given 05/17/18 0150)  iopamidol (ISOVUE-370) 76 % injection 80 mL (80 mLs Intravenous Contrast Given 05/17/18 0326)  sodium chloride 0.9 % bolus 1,000 mL (1,000 mLs Intravenous New Bag/Given 05/17/18 0350)                                                                                                                                    Procedures .Critical Care Performed by: Fatima Blank, MD Authorized by: Fatima Blank, MD   Critical care provider statement:    Critical care time (minutes):  60   Critical care start time:  05/17/2018 1:35 AM   Critical care end time:  05/17/2018 4:50 AM   Critical care was necessary to treat or prevent imminent or life-threatening deterioration of the following conditions:  Sepsis   Critical care was time spent personally by me on the following activities:  Discussions with consultants, evaluation of  patient's response to treatment, examination of patient, ordering and performing treatments and interventions, ordering and review of laboratory studies, ordering and review of radiographic studies, pulse oximetry, re-evaluation of patient's condition, obtaining history from patient or surrogate and review of old charts    (including critical care time)  Medical Decision Making / ED Course I have reviewed the nursing notes for this encounter and the patient's prior records (if available in EHR or on provided paperwork).  Clinical Course as of May 17 452  Mon May 17, 2018  0135 Patient with significant abdominal discomfort, with generalized tenderness to palpation, aminal distention  and rigidity.  Patient is currently afebrile with stable vital signs.  Initial concern for possible bowel obstruction versus perforated bowel.   [PC]  0155 Plain film with air-fluid levels concerning for possible bowel obstruction but no free air.   [PC]  0158 Elevated lactic acid. No infectious source at this time. Will hold on Abx.   [PC]  5364 CT without evidence of bowel obstruction.  It did note moderate colonic stool and gaseous distention.  Given the elevated lactic acid with leukocytosis, will continue to search for infectious etiology.  Urine pending.  We will also assess for possible mesenteric ischemia with a CT angiogram.   [PC]  0446 CT angios the abdomen revealed colitis.  Code sepsis was initiated at this time and patient was started on empiric antibiotics.  We will admit to medicine for continued management.   [PC]    Clinical Course User Index [PC] Cardama, Grayce Sessions, MD     Final Clinical Impression(s) / ED Diagnoses Final diagnoses:  Abdominal distension  Sepsis, due to unspecified organism, unspecified whether acute organ dysfunction present [facility])  Colitis      This chart was dictated using voice recognition software.  Despite best efforts to proofread,  errors can  occur which can change the documentation meaning.     Fatima Blank, MD 05/17/18 [phone]

## 2018-05-17 NOTE — ED Notes (Signed)
Pt family at bedside

## 2018-05-17 NOTE — Plan of Care (Signed)
Pt admitted to 5C15. Oriented to room and unit. Initial assessments completed and reviewing orders/plan of care.

## 2018-05-17 NOTE — H&P (Signed)
History and Physical    Amanda King FAO:[id number] DOB: August 08, 1940 DOA: 05/17/2018  PCP: Abner Greenspan, MD Consultants:  Dohmeier - neurology Patient coming from:  Home - lives with son; NOK: Son or daughter, [phone]  Chief Complaint: abdominal pain  HPI: Amanda King is a 77 y.o. female with medical history significant of narcolepsy with cataplexy; migraine; hypothyroidism; HTN; HLD; and DM presenting with abdominal pain.  She developed acute abdominal pain.  Symptoms started about 10pm, was feeling fine until then.  +nausea, no vomiting.  Last BM was was yesterday AM and normal.  Reports pain is currently 7/10.  No fevers.  ED Course: Carryover, per Dr. Myna Hidalgo: Abdominal pain with likely sepsis from colitis.   Review of Systems: As per HPI; otherwise review of systems reviewed and negative.   Ambulatory Status:  Ambulates without assistance  Past Medical History:  Diagnosis Date  . Diabetes mellitus    Type II  . Hyperlipidemia   . Hypertension   . Hypothyroidism   . Migraine   . Narcolepsy with cataplexy   . Pneumonia 08/2001    Past Surgical History:  Procedure Laterality Date  . APPENDECTOMY  age 5  . hemilaminectomy     C5-6; C6-7  . PARTIAL HYSTERECTOMY  age 12  . POSTERIOR CERVICAL FUSION/FORAMINOTOMY  02/05/1975   C5-C6-C7    Social History   Socioeconomic History  . Marital status: Widowed    Spouse name: Not on file  . Number of children: 3  . Years of education: GED  . Highest education level: Not on file  Occupational History  . Occupation: works 1 day a week  Social Needs  . Financial resource strain: Not on file  . Food insecurity:    Worry: Not on file    Inability: Not on file  . Transportation needs:    Medical: Not on file    Non-medical: Not on file  Tobacco Use  . Smoking status: Former Smoker    Last attempt to quit: 06/09/1980    Years since quitting: 37.9  . Smokeless tobacco: Never Used  Substance and Sexual Activity  .  Alcohol use: No    Alcohol/week: 0.0 standard drinks  . Drug use: No  . Sexual activity: Never  Lifestyle  . Physical activity:    Days per week: Not on file    Minutes per session: Not on file  . Stress: Not on file  Relationships  . Social connections:    Talks on phone: Not on file    Gets together: Not on file    Attends religious service: Not on file    Active member of club or organization: Not on file    Attends meetings of clubs or organizations: Not on file    Relationship status: Not on file  . Intimate partner violence:    Fear of current or ex partner: Not on file    Emotionally abused: Not on file    Physically abused: Not on file    Forced sexual activity: Not on file  Other Topics Concern  . Not on file  Social History Narrative   Patient is widowed. and her son lives with her.   Walks for exercise.     Patient has three children.   Patient is right-handed.   Patient drinks three cups of caffeine daily.     Patient is retired.   Patient has a GED.    Allergies  Allergen Reactions  . Penicillins  REACTION: GI upset    Family History  Problem Relation Age of Onset  . Depression Brother        suicide  . Diabetes Brother   . Heart disease Mother        MI  . Peripheral vascular disease Mother   . Hyperlipidemia Mother   . Diabetes Brother   . Sleep apnea Son   . Sleep apnea Brother     Prior to Admission medications   Medication Sig Start Date End Date Taking? Authorizing Provider  aspirin 81 MG tablet Take 81 mg by mouth daily.      [provider]  Blood Glucose Monitoring Suppl (BLOOD GLUCOSE MONITOR KIT) KIT Check blood sugar once daily as needed for DM     [provider]  cetirizine (ZYRTEC) 10 MG tablet Take 10 mg by mouth daily.    [provider]  glipiZIDE (GLUCOTROL XL) 10 MG 24 hr tablet Take 1 tablet (10 mg total) by mouth daily with breakfast. 03/30/18   Tower, Roque Lias A, MD  glucose blood test strip  Check blood sugar once daily as needed for DM.     [provider]  hydrochlorothiazide (HYDRODIURIL) 25 MG tablet Take 1 tablet (25 mg total) by mouth daily. 03/30/18   Tower, Wynelle Fanny, MD  ibuprofen (ADVIL,MOTRIN) 200 MG tablet Take 200 mg by mouth every 6 (six) hours as needed.      [provider]  imipramine (TOFRANIL) 50 MG tablet Take 1 tablet (50 mg total) by mouth at bedtime. 10/06/17   Dohmeier, Asencion Partridge, MD  levothyroxine (SYNTHROID, LEVOTHROID) 88 MCG tablet Take 1 tablet (88 mcg total) by mouth daily. 03/30/18   Tower, Wynelle Fanny, MD  lisinopril (PRINIVIL,ZESTRIL) 20 MG tablet TAKE 1 TABLET BY MOUTH ONCE DAILY 03/16/18   Tower, Wynelle Fanny, MD  metFORMIN (GLUCOPHAGE-XR) 750 MG 24 hr tablet Take 1 tablet (750 mg total) by mouth daily with breakfast. 03/30/18   Tower, Wynelle Fanny, MD  methylphenidate (RITALIN) 20 MG tablet Take 1 tablet (20 mg total) by mouth 3 (three) times daily with meals. 04/14/18   Dohmeier, Asencion Partridge, MD  simvastatin (ZOCOR) 80 MG tablet Take 1 tablet (80 mg total) by mouth at bedtime. 03/30/18   Tower, Wynelle Fanny, MD  sitaGLIPtin (JANUVIA) 100 MG tablet Take 1 tablet (100 mg total) by mouth daily. 03/30/18   Abner Greenspan, MD    Physical Exam: Vitals:   05/17/18 0515 05/17/18 0530 05/17/18 0600 05/17/18 0616  BP: (!) 147/76 (!) 141/84  139/69  Pulse: (!) 109 (!) 113  (!) 114  Resp: '16 10  20  ' Temp:   98.7 F (37.1 C) 97.6 F (36.4 C)  TempSrc:   Oral Oral  SpO2: 96% 96%  96%     General:  Appears calm and comfortable and is NAD; she was sleeping comfortably throughout my evaluation Eyes:   EOMI, normal lids, iris ENT:  Hard of hearing, normal lips & tongue, mildly dry mm Neck:  no LAD, masses or thyromegaly Cardiovascular:  RR with tachycardia into 110 range, no m/r/g. No LE edema.  Respiratory:   CTA bilaterally with no wheezes/rales/rhonchi.  Normal respiratory effort. Abdomen: taut, distended, diffusely mildly TTP, hypoactive BS Skin:  no rash or  induration seen on limited exam Musculoskeletal:  grossly normal tone BUE/BLE, good ROM, no bony abnormality Lower extremity:  No LE edema.  Limited foot exam with no ulcerations.  2+ distal pulses. Psychiatric:  somnolent mood and affect,  speech fluent and appropriate, AOx3 Neurologic:  CN 2-12 grossly intact, moves all extremities in coordinated fashion, sensation intact    Radiological Exams on Admission: Ct Abdomen Pelvis Wo Contrast  Result Date: 05/17/2018 CLINICAL DATA:  77 year old female with abdominal distention. Concern for bowel obstruction. EXAM: CT ABDOMEN AND PELVIS WITHOUT CONTRAST TECHNIQUE: Multidetector CT imaging of the abdomen and pelvis was performed following the standard protocol without IV contrast. COMPARISON:  Abdominal radiograph dated 05/17/2018 FINDINGS: Evaluation of this exam is limited in the absence of intravenous contrast. Lower chest: There are bibasilar atelectatic changes. No intra-abdominal free air or free fluid. Hepatobiliary: Diffuse fatty infiltration of the liver. There is a stone in the gallbladder. No pericholecystic fluid or evidence of acute cholecystitis by CT. Pancreas: Unremarkable. No pancreatic ductal dilatation or surrounding inflammatory changes. Spleen: Normal in size without focal abnormality. Adrenals/Urinary Tract: The adrenal glands are unremarkable. Right renal hypodense lesions measure up to 5 cm in the lower pole. The larger lesion demonstrates a lobulated margin and is not well evaluated on this noncontrast CT. Further evaluation with MRI or ultrasound on a nonemergent basis recommended. Subcentimeter low attenuating focus in the lateral interpolar left kidney appears to demonstrate fat attenuation and may represent a small angiomyolipoma. There is no hydronephrosis or nephrolithiasis on either side. The visualized ureters and urinary bladder appear unremarkable. Stomach/Bowel: There is moderate amount of air and stool throughout the colon.  There is no bowel obstruction or active inflammation. The appendix is normal. Vascular/Lymphatic: Advanced aortoiliac atherosclerotic disease. No portal venous gas. There is no adenopathy. Reproductive: Hysterectomy. No pelvic mass. Other: Small fat. Musculoskeletal: Degenerative changes of the spine. No acute osseous pathology. IMPRESSION: 1. Moderate stool and air in the colon. No bowel obstruction or active inflammation. Normal appendix. 2. Fatty liver. 3. Cholelithiasis. 4. Right renal hypodense lesions as described. Further evaluation with MRI or ultrasound on a nonemergent basis recommended. Electronically Signed   By: Anner Crete M.D.   On: 05/17/2018 02:42   Dg Abd Acute W/chest  Result Date: 05/17/2018 CLINICAL DATA:  77 year old female with abdominal distention. EXAM: DG ABDOMEN ACUTE W/ 1V CHEST COMPARISON:  Abdominal CT dated 05/17/2018 FINDINGS: There is shallow inspiration with bibasilar atelectasis. No focal consolidation, pleural effusion or pneumothorax. Top-normal cardiac size. There is moderate colonic stool burden. No small bowel dilatation or evidence of obstruction. No free air or radiopaque calculi. Degenerative changes of the spine. IMPRESSION: 1. No acute cardiopulmonary process. 2. Moderate colonic stool burden. No bowel obstruction. Electronically Signed   By: Anner Crete M.D.   On: 05/17/2018 02:22   Ct Angio Abd/pel W And/or Wo Contrast  Result Date: 05/17/2018 CLINICAL DATA:  77 year old female with abdominal pain and distention. Concern for mesenteric ischemia. EXAM: CTA ABDOMEN AND PELVIS wITHOUT AND WITH CONTRAST TECHNIQUE: Multidetector CT imaging of the abdomen and pelvis was performed using the standard protocol during bolus administration of intravenous contrast. Multiplanar reconstructed images and MIPs were obtained and reviewed to evaluate the vascular anatomy. CONTRAST:  68m ISOVUE-370 IOPAMIDOL (ISOVUE-370) INJECTION 76% COMPARISON:  CT of the abdomen  pelvis dated 05/17/2018 FINDINGS: VASCULAR Aorta: Moderate atherosclerotic disease of the aorta. No aneurysmal dilatation or dissection. Celiac: Patent without evidence of aneurysm, dissection, vasculitis or significant stenosis. SMA: Atherosclerotic calcification of the origin of the SMA. The SMA is widely patent. Renals: Atherosclerotic calcification of the ostia of the left renal artery. The renal arteries are patent. IMA: Patent without evidence of aneurysm, dissection, vasculitis or significant stenosis. Inflow: Patent  without evidence of aneurysm, dissection, vasculitis or significant stenosis. Proximal Outflow: Bilateral common femoral and visualized portions of the superficial and profunda femoral arteries are patent without evidence of aneurysm, dissection, vasculitis or significant stenosis. Veins: No obvious venous abnormality within the limitations of this arterial phase study. No portal venous gas. Review of the MIP images confirms the above findings. NON-VASCULAR Lower chest: Bibasilar atelectasis/scarring. No intra-abdominal free air or free fluid. Hepatobiliary: Diffuse fatty infiltration of the liver. A 4.5 x 4.2 cm hypodense lesion in the right lobe of the liver with peripheral enhancement is not well characterized but may represent a hemangioma. Further characterization with MRI on a nonemergent basis recommended. No intrahepatic biliary ductal dilatation. There is a small stone in the gallbladder. No pericholecystic fluid. Pancreas: Unremarkable. No pancreatic ductal dilatation or surrounding inflammatory changes. Spleen: Normal in size without focal abnormality. Adrenals/Urinary Tract: The adrenal glands are unremarkable. Multiple right renal cysts measuring up to 5 cm in the inferior pole of the right kidney. There is no hydronephrosis on either side. There is symmetric enhancement of the kidneys bilaterally. The visualized ureters and urinary bladder appear unremarkable. Stomach/Bowel: There  is moderate amount of air and stool throughout the colon. There is small sigmoid diverticula without active inflammatory changes. There is thickening of the splenic flexure and to a lesser degree the descending colon with mild surrounding inflammatory changes most consistent with colitis. No pneumatosis. There is no bowel obstruction. The appendix is normal. Lymphatic: No adenopathy. Reproductive: Hysterectomy. No pelvic mass. Other: Small fat containing umbilical hernia. Musculoskeletal: Degenerative changes of the spine. No acute osseous pathology. IMPRESSION: VASCULAR Moderate atherosclerotic disease of the aorta. No aneurysmal dilatation or dissection. No acute or significant arterial occlusion. NON-VASCULAR 1. No CT evidence of mesenteric ischemia. 2. Findings most consistent with colitis involving the splenic flexure and to a lesser degree the descending colon. No bowel obstruction. Normal appendix. 3. Sigmoid diverticulosis without active inflammatory changes. 4. Fatty liver. 5. Indeterminate right hepatic hypodense lesion, likely a hemangioma. Further characterization with MRI on a nonemergent basis recommended. 6. Cholelithiasis. 7. Right renal cysts. No hydronephrosis. Electronically Signed   By: Anner Crete M.D.   On: 05/17/2018 04:26    EKG: Independently reviewed.  Sinus tachycardia with rate 106; RBBB;  nonspecific ST changes with no evidence of acute ischemia   Labs on Admission: I have personally reviewed the available labs and imaging studies at the time of the admission.  Pertinent labs:   Glucose 276 BUN 24/Creatinine 1.24/GFR 42 - generally stable Troponin 0 Lactate 2.43, 2.27 WBC 17.8 UA: 150 glucose, trace LE, rare bacteria  Assessment/Plan Principal Problem:   Colitis presumed infectious Active Problems:   Diabetes type 2, uncontrolled (HCC)   Hyperlipidemia LDL goal <100   Essential hypertension   Narcolepsy with cataplexy   Colitis -Patient presenting with  acute onset of abdominal pain last night; mild nausea without vomiting, no diarrhea -Her exam shows a taut and distended abdomen with diminished bowel sounds -Her only SIRS criteria are leukocytosis and tachycardia; while this could be related to sepsis, this seems less likely as her diagnosis -Her lactate is persistently elevated; while this may be related to colitis and mild dehydration, it also begs the question of ischemic colitis -Initial xray and CT without contrast appeared to only indicate a moderate stool burden -CTA appears to have ruled out ischemic colitis but showed colitis involing the splenic flexure and to a lesser degree the descending colon.  It also showed a  probable hepatic hemangioma which may benefit from MRI characterization in the future. -For now, will give bowel rest, IVF, pain medication with morphine, nausea medication with Zofran, and treat with Cipro/Flagyl for intraabdominal infection -I have also requested that surgery consult on this patient given her abdominal exam and the unusual nature of the imaging. -It is not clear if she has ever had a colonoscopy - in 7/13, her note reported that "Pt is not ready for colonoscopy - she pref to do ifob"; in 2015 she again declined colonoscopy and opted for stool cards; in 2017 there is a reports of "cologuard - will enroll in the program Has declined colonoscopies in the past"; and on 03/30/18 there was a note of "declines further colon cancer screening at her age."  GI consult for colonoscopy may need to be considered if she is not improving. -Will trend lactate  DM -Her last A1c was 8.0 on 10/1 -Hold PO medications - Glucotrol, Glucophage, Januvia -Will cover with moderate-scale SSI for now  HTN -Hold HCTZ given mild volume deficiency -Continue Lisinopril  Hypothyroidism -Normal TSH on 10/1 -Continue Synthroid at current dose for now  HLD -Lipids on 10/1: 188/39/94/357  Narcolepsy -Continue imipramine; hold  Ritalin   DVT prophylaxis: Lovenox  Code Status: DNR - confirmed with family Family Communication: Daughter present throughout evaluation Disposition Plan:  Home once clinically improved Consults called: Surgery  Admission status: It is my clinical opinion that referral for OBSERVATION is reasonable and necessary in this patient based on the above information provided. The aforementioned taken together are felt to place the patient at high risk for further clinical deterioration. However it is anticipated that the patient may be medically stable for discharge from the hospital within 24 to 48 hours.    Karmen Bongo MD [facility]  If note is complete, please contact covering daytime or nighttime physician. [email or web address] Password [email or web address]  05/17/2018, 8:07 AM

## 2018-05-17 NOTE — ED Notes (Signed)
Pt returned to room  

## 2018-05-18 ENCOUNTER — Observation Stay (HOSPITAL_COMMUNITY): Payer: Medicare Other

## 2018-05-18 DIAGNOSIS — Z7984 Long term (current) use of oral hypoglycemic drugs: Secondary | ICD-10-CM | POA: Diagnosis not present

## 2018-05-18 DIAGNOSIS — I10 Essential (primary) hypertension: Secondary | ICD-10-CM | POA: Diagnosis not present

## 2018-05-18 DIAGNOSIS — Z833 Family history of diabetes mellitus: Secondary | ICD-10-CM | POA: Diagnosis not present

## 2018-05-18 DIAGNOSIS — R748 Abnormal levels of other serum enzymes: Secondary | ICD-10-CM | POA: Diagnosis present

## 2018-05-18 DIAGNOSIS — R339 Retention of urine, unspecified: Secondary | ICD-10-CM | POA: Diagnosis not present

## 2018-05-18 DIAGNOSIS — E860 Dehydration: Secondary | ICD-10-CM | POA: Diagnosis not present

## 2018-05-18 DIAGNOSIS — R062 Wheezing: Secondary | ICD-10-CM | POA: Diagnosis not present

## 2018-05-18 DIAGNOSIS — R1084 Generalized abdominal pain: Secondary | ICD-10-CM | POA: Diagnosis not present

## 2018-05-18 DIAGNOSIS — N179 Acute kidney failure, unspecified: Secondary | ICD-10-CM | POA: Diagnosis not present

## 2018-05-18 DIAGNOSIS — I129 Hypertensive chronic kidney disease with stage 1 through stage 4 chronic kidney disease, or unspecified chronic kidney disease: Secondary | ICD-10-CM | POA: Diagnosis present

## 2018-05-18 DIAGNOSIS — K567 Ileus, unspecified: Secondary | ICD-10-CM | POA: Diagnosis present

## 2018-05-18 DIAGNOSIS — E1165 Type 2 diabetes mellitus with hyperglycemia: Secondary | ICD-10-CM | POA: Diagnosis not present

## 2018-05-18 DIAGNOSIS — R933 Abnormal findings on diagnostic imaging of other parts of digestive tract: Secondary | ICD-10-CM | POA: Diagnosis not present

## 2018-05-18 DIAGNOSIS — K529 Noninfective gastroenteritis and colitis, unspecified: Secondary | ICD-10-CM | POA: Diagnosis present

## 2018-05-18 DIAGNOSIS — K633 Ulcer of intestine: Secondary | ICD-10-CM | POA: Diagnosis present

## 2018-05-18 DIAGNOSIS — N183 Chronic kidney disease, stage 3 (moderate): Secondary | ICD-10-CM | POA: Diagnosis present

## 2018-05-18 DIAGNOSIS — K644 Residual hemorrhoidal skin tags: Secondary | ICD-10-CM | POA: Diagnosis not present

## 2018-05-18 DIAGNOSIS — Z66 Do not resuscitate: Secondary | ICD-10-CM | POA: Diagnosis present

## 2018-05-18 DIAGNOSIS — K559 Vascular disorder of intestine, unspecified: Secondary | ICD-10-CM | POA: Diagnosis present

## 2018-05-18 DIAGNOSIS — K641 Second degree hemorrhoids: Secondary | ICD-10-CM | POA: Diagnosis present

## 2018-05-18 DIAGNOSIS — K5901 Slow transit constipation: Secondary | ICD-10-CM | POA: Diagnosis not present

## 2018-05-18 DIAGNOSIS — E039 Hypothyroidism, unspecified: Secondary | ICD-10-CM | POA: Diagnosis present

## 2018-05-18 DIAGNOSIS — E872 Acidosis: Secondary | ICD-10-CM | POA: Diagnosis present

## 2018-05-18 DIAGNOSIS — R140 Abdominal distension (gaseous): Secondary | ICD-10-CM | POA: Diagnosis not present

## 2018-05-18 DIAGNOSIS — K5730 Diverticulosis of large intestine without perforation or abscess without bleeding: Secondary | ICD-10-CM | POA: Diagnosis present

## 2018-05-18 DIAGNOSIS — E1122 Type 2 diabetes mellitus with diabetic chronic kidney disease: Secondary | ICD-10-CM | POA: Diagnosis present

## 2018-05-18 DIAGNOSIS — G47411 Narcolepsy with cataplexy: Secondary | ICD-10-CM | POA: Diagnosis present

## 2018-05-18 DIAGNOSIS — K5900 Constipation, unspecified: Secondary | ICD-10-CM | POA: Diagnosis not present

## 2018-05-18 DIAGNOSIS — E785 Hyperlipidemia, unspecified: Secondary | ICD-10-CM | POA: Diagnosis present

## 2018-05-18 DIAGNOSIS — D649 Anemia, unspecified: Secondary | ICD-10-CM | POA: Diagnosis present

## 2018-05-18 DIAGNOSIS — Z880 Allergy status to penicillin: Secondary | ICD-10-CM | POA: Diagnosis not present

## 2018-05-18 DIAGNOSIS — Z7982 Long term (current) use of aspirin: Secondary | ICD-10-CM | POA: Diagnosis not present

## 2018-05-18 DIAGNOSIS — Z79890 Hormone replacement therapy (postmenopausal): Secondary | ICD-10-CM | POA: Diagnosis not present

## 2018-05-18 LAB — GLUCOSE, CAPILLARY
Glucose-Capillary: 238 mg/dL — ABNORMAL HIGH (ref 70–99)
Glucose-Capillary: 149 mg/dL — ABNORMAL HIGH (ref 70–99)
Glucose-Capillary: 155 mg/dL — ABNORMAL HIGH (ref 70–99)
Glucose-Capillary: 155 mg/dL — ABNORMAL HIGH (ref 70–99)

## 2018-05-18 LAB — BASIC METABOLIC PANEL
Anion gap: 10 (ref 5–15)
BUN: 19 mg/dL (ref 8–23)
CO2: 24 mmol/L (ref 22–32)
CREATININE: 1.04 mg/dL — AB (ref 0.44–1.00)
Calcium: 6.9 mg/dL — ABNORMAL LOW (ref 8.9–10.3)
Chloride: 104 mmol/L (ref 98–111)
GFR calc non Af Amer: 51 mL/min — ABNORMAL LOW (ref >60)
GFR, EST AFRICAN AMERICAN: 60 mL/min — AB (ref >60)
Glucose, Bld: 209 mg/dL — ABNORMAL HIGH (ref 70–99)
Potassium: 3.5 mmol/L (ref 3.5–5.1)
Sodium: 138 mmol/L (ref 135–145)

## 2018-05-18 LAB — LACTIC ACID, PLASMA: Lactic Acid, Venous: 1.6 mmol/L (ref 0.5–1.9)

## 2018-05-18 LAB — CBC
HCT: 32.9 % — ABNORMAL LOW (ref 36.0–46.0)
Hemoglobin: 10.7 g/dL — ABNORMAL LOW (ref 12.0–15.0)
MCH: 29.8 pg (ref 26.0–34.0)
MCHC: 32.5 g/dL (ref 30.0–36.0)
MCV: 91.6 fL (ref 80.0–100.0)
NRBC: 0 % (ref 0.0–0.2)
Platelets: 207 10*3/uL (ref 150–400)
RBC: 3.59 MIL/uL — ABNORMAL LOW (ref 3.87–5.11)
RDW: 13.1 % (ref 11.5–15.5)
WBC: 15.5 10*3/uL — ABNORMAL HIGH (ref 4.0–10.5)

## 2018-05-18 MED ORDER — POLYETHYLENE GLYCOL 3350 17 G PO PACK
17.0000 g | PACK | Freq: Every day | ORAL | Status: DC
  Administered 2018-05-18 – 2018-05-23 (×5): 17 g via ORAL
  Filled 2018-05-18 (×6): qty 1

## 2018-05-18 MED ORDER — INSULIN ASPART 100 UNIT/ML ~~LOC~~ SOLN
0.0000 [IU] | SUBCUTANEOUS | Status: DC
  Administered 2018-05-18: 2 [IU] via SUBCUTANEOUS
  Administered 2018-05-18: 1 [IU] via SUBCUTANEOUS

## 2018-05-18 MED ORDER — FLEET ENEMA 7-19 GM/118ML RE ENEM
1.0000 | ENEMA | Freq: Once | RECTAL | Status: AC
  Administered 2018-05-18: 1 via RECTAL
  Filled 2018-05-18: qty 1

## 2018-05-18 MED ORDER — MORPHINE SULFATE (PF) 2 MG/ML IV SOLN
1.0000 mg | INTRAVENOUS | Status: DC | PRN
  Administered 2018-05-18: 1 mg via INTRAVENOUS
  Filled 2018-05-18 (×2): qty 1

## 2018-05-18 MED ORDER — BISACODYL 10 MG RE SUPP: 10.0000 mg | Freq: Once | RECTAL | Status: DC

## 2018-05-18 MED ORDER — BISACODYL 10 MG RE SUPP: 10.0000 mg | Freq: Every day | RECTAL | Status: DC | PRN

## 2018-05-18 MED ORDER — POTASSIUM CHLORIDE IN NACL 20-0.9 MEQ/L-% IV SOLN
INTRAVENOUS | Status: DC
  Administered 2018-05-18 – 2018-05-20 (×3): via INTRAVENOUS
  Filled 2018-05-18 (×6): qty 1000

## 2018-05-18 MED ORDER — LEVOTHYROXINE SODIUM 100 MCG IV SOLR
44.0000 ug | Freq: Every day | INTRAVENOUS | Status: DC
  Administered 2018-05-18 – 2018-05-19 (×2): 44 ug via INTRAVENOUS
  Filled 2018-05-18 (×2): qty 5

## 2018-05-18 NOTE — Progress Notes (Signed)
Progress Note    MUNIRAH DOERNER  ZOX:[id number] DOB: 07/29/1940  DOA: 05/17/2018 PCP: Judy Pimple, MD    Brief Narrative:     Medical records reviewed and are as summarized below:  HENNY STRAUCH is an 77 y.o. female with medical history significant of narcolepsy with cataplexy; migraine; hypothyroidism; HTN; HLD; and DM presenting with abdominal pain.  She developed acute abdominal pain.  Symptoms started about 10pm, was feeling fine until then.  +nausea, no vomiting.  Last BM was was yesterday AM and normal.   Assessment/Plan:   Principal Problem:   Colitis presumed infectious Active Problems:   Diabetes type 2, uncontrolled (HCC)   Hyperlipidemia LDL goal <100   Essential hypertension   Narcolepsy with cataplexy  Colitis -Patient presenting with acute onset of abdominal pain last night; mild nausea without vomiting, no diarrhea -Initial xray and CT without contrast appeared to only indicate a moderate stool burden -CTA appears to have ruled out ischemic colitis but showed colitis involing the splenic flexure and to a lesser degree the descending colon.  It also showed a probable hepatic hemangioma which may benefit from MRI characterization in the future. -general surgery consult appreciated: plan to start clears - IVF, pain medication with morphine, nausea medication with Zofran, and treat with Cipro/Flagyl for intraabdominal infection -trend lactate -will hold on GI consult for now -repeat x ray this AM shows ? Ileus and large amount of stool -enema/miralax/suppository -ambulate to encourage bowel movements as well  Acute urinary retention -s/p I/O -bladder scan PRN -strict I/O  DM -Her last A1c was 8.0 on 10/1 -Hold PO medications - Glucotrol, Glucophage, Januvia -Will cover with moderate-scale SSI for now  HTN -Hold HCTZ/lisinopril given mild volume deficiency  Hypothyroidism -Normal TSH on 10/1 -Continue Synthroid at current dose for now-- will  change to IV until sure she can take PO  HLD -Lipids on 10/1: 188/39/94/357  Narcolepsy -Continue imipramine; hold Ritalin   Family Communication/Anticipated D/C date and plan/Code Status   DVT prophylaxis: Lovenox ordered. Code Status: dnr Family Communication: daughter at bedside Disposition Plan: will need more than 2 midnights of in hospital care, needs continued IVF and IV medications including abx   Medical Consultants:    General surgery     Subjective:   Has to have urinary cath in the middle of the night Passing some gas but she is very uncomfortable  Objective:    Vitals:   05/18/18 0619 05/18/18 0818 05/18/18 1100 05/18/18 1104  BP: (!) 116/55     Pulse: (!) 104  (!) 106 (!) 109  Resp: 18 20    Temp:      TempSrc:      SpO2: 99%  94% (!) 89%  Weight:      Height:        Intake/Output Summary (Last 24 hours) at 05/18/2018 1157 Last data filed at 05/18/2018 0818 Gross per 24 hour  Intake 2377.15 ml  Output 1100 ml  Net 1277.15 ml   Filed Weights   05/17/18 1627  Weight: 73 kg    Exam: Ill appearing female Tachy but regular Diminished breath sounds Decreased bowel sounds, distended abdomen and tender to palpation Pulses intact  Data Reviewed:   I have personally reviewed following labs and imaging studies:  Labs: Labs show the following:   Basic Metabolic Panel: Recent Labs  Lab 05/17/18 0112 05/18/18 0509  NA 136 138  K 3.8 3.5  CL 96* 104  CO2 30  24  GLUCOSE 276* 209*  BUN 24* 19  CREATININE 1.24* 1.04*  CALCIUM 8.6* 6.9*   GFR Estimated Creatinine Clearance: 42.2 mL/min (A) (by C-G formula based on SCr of 1.04 mg/dL (H)). Liver Function Tests: Recent Labs  Lab 05/17/18 0112  AST 24  ALT 30  ALKPHOS 81  BILITOT 0.5  PROT 6.9  ALBUMIN 4.3   Recent Labs  Lab 05/17/18 0112  LIPASE 54*   No results for input(s): AMMONIA in the last 168 hours. Coagulation profile No results for input(s): INR, PROTIME in  the last 168 hours.  CBC: Recent Labs  Lab 05/17/18 0112 05/18/18 0509  WBC 17.8* 15.5*  HGB 14.0 10.7*  HCT 43.3 32.9*  MCV 91.7 91.6  PLT 296 207   Cardiac Enzymes: No results for input(s): CKTOTAL, CKMB, CKMBINDEX, TROPONINI in the last 168 hours. BNP (last 3 results) No results for input(s): PROBNP in the last 8760 hours. CBG: Recent Labs  Lab 05/17/18 1119 05/17/18 1707 05/17/18 2157 05/18/18 0712  GLUCAP 229* 190* 181* 238*   D-Dimer: No results for input(s): DDIMER in the last 72 hours. Hgb A1c: No results for input(s): HGBA1C in the last 72 hours. Lipid Profile: No results for input(s): CHOL, HDL, LDLCALC, TRIG, CHOLHDL, LDLDIRECT in the last 72 hours. Thyroid function studies: No results for input(s): TSH, T4TOTAL, T3FREE, THYROIDAB in the last 72 hours.  Invalid input(s): FREET3 Anemia work up: No results for input(s): VITAMINB12, FOLATE, FERRITIN, TIBC, IRON, RETICCTPCT in the last 72 hours. Sepsis Labs: Recent Labs  Lab 05/17/18 0112 05/17/18 0140 05/17/18 0410 05/17/18 0840 05/17/18 1142 05/18/18 0509  WBC 17.8*  --   --   --   --  15.5*  LATICACIDVEN  --  2.43* 2.27* 3.2* 2.7*  --     Microbiology No results found for this or any previous visit (from the past 240 hour(s)).  Procedures and diagnostic studies:  Ct Abdomen Pelvis Wo Contrast  Result Date: 05/17/2018 CLINICAL DATA:  77 year old female with abdominal distention. Concern for bowel obstruction. EXAM: CT ABDOMEN AND PELVIS WITHOUT CONTRAST TECHNIQUE: Multidetector CT imaging of the abdomen and pelvis was performed following the standard protocol without IV contrast. COMPARISON:  Abdominal radiograph dated 05/17/2018 FINDINGS: Evaluation of this exam is limited in the absence of intravenous contrast. Lower chest: There are bibasilar atelectatic changes. No intra-abdominal free air or free fluid. Hepatobiliary: Diffuse fatty infiltration of the liver. There is a stone in the gallbladder.  No pericholecystic fluid or evidence of acute cholecystitis by CT. Pancreas: Unremarkable. No pancreatic ductal dilatation or surrounding inflammatory changes. Spleen: Normal in size without focal abnormality. Adrenals/Urinary Tract: The adrenal glands are unremarkable. Right renal hypodense lesions measure up to 5 cm in the lower pole. The larger lesion demonstrates a lobulated margin and is not well evaluated on this noncontrast CT. Further evaluation with MRI or ultrasound on a nonemergent basis recommended. Subcentimeter low attenuating focus in the lateral interpolar left kidney appears to demonstrate fat attenuation and may represent a small angiomyolipoma. There is no hydronephrosis or nephrolithiasis on either side. The visualized ureters and urinary bladder appear unremarkable. Stomach/Bowel: There is moderate amount of air and stool throughout the colon. There is no bowel obstruction or active inflammation. The appendix is normal. Vascular/Lymphatic: Advanced aortoiliac atherosclerotic disease. No portal venous gas. There is no adenopathy. Reproductive: Hysterectomy. No pelvic mass. Other: Small fat. Musculoskeletal: Degenerative changes of the spine. No acute osseous pathology. IMPRESSION: 1. Moderate stool and air in the colon.  No bowel obstruction or active inflammation. Normal appendix. 2. Fatty liver. 3. Cholelithiasis. 4. Right renal hypodense lesions as described. Further evaluation with MRI or ultrasound on a nonemergent basis recommended. Electronically Signed   By: Elgie Collard M.D.   On: 05/17/2018 02:42   Dg Abd 1 View  Result Date: 05/18/2018 CLINICAL DATA:  Abdominal pain, distention EXAM: ABDOMEN - 1 VIEW COMPARISON:  05/17/2018 FINDINGS: Mild gaseous distention of both large and small bowel loops. Moderate to large stool burden in the colon. No organomegaly or free air. IMPRESSION: Mild diffuse gaseous distention of both large and small bowel may reflect ileus. Moderate to large  stool burden. Electronically Signed   By: Charlett Nose M.D.   On: 05/18/2018 10:23   Dg Abd Acute W/chest  Result Date: 05/17/2018 CLINICAL DATA:  77 year old female with abdominal distention. EXAM: DG ABDOMEN ACUTE W/ 1V CHEST COMPARISON:  Abdominal CT dated 05/17/2018 FINDINGS: There is shallow inspiration with bibasilar atelectasis. No focal consolidation, pleural effusion or pneumothorax. Top-normal cardiac size. There is moderate colonic stool burden. No small bowel dilatation or evidence of obstruction. No free air or radiopaque calculi. Degenerative changes of the spine. IMPRESSION: 1. No acute cardiopulmonary process. 2. Moderate colonic stool burden. No bowel obstruction. Electronically Signed   By: Elgie Collard M.D.   On: 05/17/2018 02:22   Ct Angio Abd/pel W And/or Wo Contrast  Result Date: 05/17/2018 CLINICAL DATA:  77 year old female with abdominal pain and distention. Concern for mesenteric ischemia. EXAM: CTA ABDOMEN AND PELVIS wITHOUT AND WITH CONTRAST TECHNIQUE: Multidetector CT imaging of the abdomen and pelvis was performed using the standard protocol during bolus administration of intravenous contrast. Multiplanar reconstructed images and MIPs were obtained and reviewed to evaluate the vascular anatomy. CONTRAST:  80mL ISOVUE-370 IOPAMIDOL (ISOVUE-370) INJECTION 76% COMPARISON:  CT of the abdomen pelvis dated 05/17/2018 FINDINGS: VASCULAR Aorta: Moderate atherosclerotic disease of the aorta. No aneurysmal dilatation or dissection. Celiac: Patent without evidence of aneurysm, dissection, vasculitis or significant stenosis. SMA: Atherosclerotic calcification of the origin of the SMA. The SMA is widely patent. Renals: Atherosclerotic calcification of the ostia of the left renal artery. The renal arteries are patent. IMA: Patent without evidence of aneurysm, dissection, vasculitis or significant stenosis. Inflow: Patent without evidence of aneurysm, dissection, vasculitis or significant  stenosis. Proximal Outflow: Bilateral common femoral and visualized portions of the superficial and profunda femoral arteries are patent without evidence of aneurysm, dissection, vasculitis or significant stenosis. Veins: No obvious venous abnormality within the limitations of this arterial phase study. No portal venous gas. Review of the MIP images confirms the above findings. NON-VASCULAR Lower chest: Bibasilar atelectasis/scarring. No intra-abdominal free air or free fluid. Hepatobiliary: Diffuse fatty infiltration of the liver. A 4.5 x 4.2 cm hypodense lesion in the right lobe of the liver with peripheral enhancement is not well characterized but may represent a hemangioma. Further characterization with MRI on a nonemergent basis recommended. No intrahepatic biliary ductal dilatation. There is a small stone in the gallbladder. No pericholecystic fluid. Pancreas: Unremarkable. No pancreatic ductal dilatation or surrounding inflammatory changes. Spleen: Normal in size without focal abnormality. Adrenals/Urinary Tract: The adrenal glands are unremarkable. Multiple right renal cysts measuring up to 5 cm in the inferior pole of the right kidney. There is no hydronephrosis on either side. There is symmetric enhancement of the kidneys bilaterally. The visualized ureters and urinary bladder appear unremarkable. Stomach/Bowel: There is moderate amount of air and stool throughout the colon. There is small sigmoid diverticula without active  inflammatory changes. There is thickening of the splenic flexure and to a lesser degree the descending colon with mild surrounding inflammatory changes most consistent with colitis. No pneumatosis. There is no bowel obstruction. The appendix is normal. Lymphatic: No adenopathy. Reproductive: Hysterectomy. No pelvic mass. Other: Small fat containing umbilical hernia. Musculoskeletal: Degenerative changes of the spine. No acute osseous pathology. IMPRESSION: VASCULAR Moderate  atherosclerotic disease of the aorta. No aneurysmal dilatation or dissection. No acute or significant arterial occlusion. NON-VASCULAR 1. No CT evidence of mesenteric ischemia. 2. Findings most consistent with colitis involving the splenic flexure and to a lesser degree the descending colon. No bowel obstruction. Normal appendix. 3. Sigmoid diverticulosis without active inflammatory changes. 4. Fatty liver. 5. Indeterminate right hepatic hypodense lesion, likely a hemangioma. Further characterization with MRI on a nonemergent basis recommended. 6. Cholelithiasis. 7. Right renal cysts. No hydronephrosis. Electronically Signed   By: Elgie CollardArash  Radparvar M.D.   On: 05/17/2018 04:26    Medications:   . enoxaparin (LOVENOX) injection  40 mg Subcutaneous Q24H  . imipramine  50 mg Oral QHS  . insulin aspart  0-9 Units Subcutaneous Q4H  . levothyroxine  44 mcg Intravenous Daily  . mouth rinse  15 mL Mouth Rinse BID  . polyethylene glycol  17 g Oral Daily  . sodium phosphate  1 enema Rectal Once   Continuous Infusions: . sodium chloride 75 mL/hr at 05/18/18 0952  . ciprofloxacin 400 mg (05/18/18 0558)  . metronidazole 500 mg (05/18/18 0407)     LOS: 0 days   Joseph ArtJessica U Suri Tafolla  [facility]   *Please refer to [email or web address], password [email or web address] to get updated schedule on who will round on this patient, as hospitalists switch teams weekly. If 7PM-7AM, please contact night-coverage at [email or web address], password [email or web address] for any overnight needs.  05/18/2018, 11:57 AM

## 2018-05-18 NOTE — Progress Notes (Signed)
RN assessed pt's output and realized no urinary out since 1900. Bladder scan done and pt had 689 cc. Attending notified and In and Out cath was ordered.  Straight cath yielded 800 mls of urine. Patient  Made comfortable in bed and will be monitored closely.

## 2018-05-18 NOTE — Progress Notes (Signed)
[facility] Progress Note      Assessment/Plan Diabetes type 2, uncontrolled (HCC)   Hyperlipidemia LDL goal <100   Essential hypertension   Narcolepsy with cataplexy  Abdominal pain likely 2/2 colitis involving the splenic flexure and proximal descending colon - CTA did not show any indication of ischemia - she does not have an acute abdomin and there is no indication for surgical intervention at this time, we will follow - AXR pending  FEN: NPO, ice chips, sips VTE: SCD's, lovenox ID: Cipro & Flagyl WBC down to 15.5 from 17.8, afebrile Foley: wick Follow up: TBD  Plan: AXR pending, conservative management   LOS: 0 days    Subjective: CC: abdominal pain  Pt states pain is improved. Still having nausea but no vomiting. Still having flatus. Still distention. No new complaints. No issues overnight. No family at bedside.   Objective: Vital signs in last 24 hours: Temp:  [97.7 F (36.5 C)-98 F (36.7 C)] 97.9 F (36.6 C) (12/10 0034) Pulse Rate:  [104-110] 104 (12/10 0619) Resp:  [18-20] 20 (12/10 0818) BP: (116-151)/(54-65) 116/55 (12/10 0619) SpO2:  [96 %-100 %] 99 % (12/10 0619) Weight:  [73 kg] 73 kg (12/09 1627) Last BM Date: 05/17/18  Intake/Output from previous day: 12/09 0701 - 12/10 0700 In: 1896.7 [IV Piggyback:1896.7] Out: 1100 [Urine:1100] Intake/Output this shift: Total I/O In: 480.5 [IV Piggyback:480.5] Out: -   PE: Gen:  Alert, NAD, pleasant, cooperative Pulm:  Rate and effort normal Abd: Soft. moderate distention. Bowel sounds are decreased. Mild generalized tenderness without peritonitis. There is no rigidity and no guarding Skin: no rashes noted, warm and dry   Anti-infectives: Anti-infectives (From admission, onward)   Start     Dose/Rate Route Frequency Ordered Stop   05/17/18 0600  ciprofloxacin (CIPRO) IVPB 400 mg     400 mg 200 mL/hr over 60 Minutes Intravenous Every 12 hours 05/17/18 0503     05/17/18 0500   cefTRIAXone (ROCEPHIN) 2 g in sodium chloride 0.9 % 100 mL IVPB  Status:  Discontinued    Note to Pharmacy:  PCN allergy is GI upset--changing Levaquin to Rocephin 2 g IV as per consult   2 g 200 mL/hr over 30 Minutes Intravenous  Once 05/17/18 0452 05/17/18 0453   05/17/18 0445  levofloxacin (LEVAQUIN) IVPB 750 mg  Status:  Discontinued     750 mg 100 mL/hr over 90 Minutes Intravenous  Once 05/17/18 0434 05/17/18 0452   05/17/18 0445  metroNIDAZOLE (FLAGYL) IVPB 500 mg     500 mg 100 mL/hr over 60 Minutes Intravenous Every 8 hours 05/17/18 0434        Lab Results:  Recent Labs    05/17/18 0112 05/18/18 0509  WBC 17.8* 15.5*  HGB 14.0 10.7*  HCT 43.3 32.9*  PLT 296 207   BMET Recent Labs    05/17/18 0112 05/18/18 0509  NA 136 138  K 3.8 3.5  CL 96* 104  CO2 30 24  GLUCOSE 276* 209*  BUN 24* 19  CREATININE 1.24* 1.04*  CALCIUM 8.6* 6.9*   PT/INR No results for input(s): LABPROT, INR in the last 72 hours. CMP     Component Value Date/Time   NA 138 05/18/2018 0509   K 3.5 05/18/2018 0509   CL 104 05/18/2018 0509   CO2 24 05/18/2018 0509   GLUCOSE 209 (H) 05/18/2018 0509   BUN 19 05/18/2018 0509   CREATININE 1.04 (H) 05/18/2018 0509   CALCIUM 6.9 (L) 05/18/2018 16100509  PROT 6.9 05/17/2018 0112   ALBUMIN 4.3 05/17/2018 0112   AST 24 05/17/2018 0112   ALT 30 05/17/2018 0112   ALKPHOS 81 05/17/2018 0112   BILITOT 0.5 05/17/2018 0112   GFRNONAA 51 (L) 05/18/2018 0509   GFRAA 60 (L) 05/18/2018 0509   Lipase     Component Value Date/Time   LIPASE 54 (H) 05/17/2018 0112    Studies/Results: Ct Abdomen Pelvis Wo Contrast  Result Date: 05/17/2018 CLINICAL DATA:  77 year old female with abdominal distention. Concern for bowel obstruction. EXAM: CT ABDOMEN AND PELVIS WITHOUT CONTRAST TECHNIQUE: Multidetector CT imaging of the abdomen and pelvis was performed following the standard protocol without IV contrast. COMPARISON:  Abdominal radiograph dated 05/17/2018  FINDINGS: Evaluation of this exam is limited in the absence of intravenous contrast. Lower chest: There are bibasilar atelectatic changes. No intra-abdominal free air or free fluid. Hepatobiliary: Diffuse fatty infiltration of the liver. There is a stone in the gallbladder. No pericholecystic fluid or evidence of acute cholecystitis by CT. Pancreas: Unremarkable. No pancreatic ductal dilatation or surrounding inflammatory changes. Spleen: Normal in size without focal abnormality. Adrenals/Urinary Tract: The adrenal glands are unremarkable. Right renal hypodense lesions measure up to 5 cm in the lower pole. The larger lesion demonstrates a lobulated margin and is not well evaluated on this noncontrast CT. Further evaluation with MRI or ultrasound on a nonemergent basis recommended. Subcentimeter low attenuating focus in the lateral interpolar left kidney appears to demonstrate fat attenuation and may represent a small angiomyolipoma. There is no hydronephrosis or nephrolithiasis on either side. The visualized ureters and urinary bladder appear unremarkable. Stomach/Bowel: There is moderate amount of air and stool throughout the colon. There is no bowel obstruction or active inflammation. The appendix is normal. Vascular/Lymphatic: Advanced aortoiliac atherosclerotic disease. No portal venous gas. There is no adenopathy. Reproductive: Hysterectomy. No pelvic mass. Other: Small fat. Musculoskeletal: Degenerative changes of the spine. No acute osseous pathology. IMPRESSION: 1. Moderate stool and air in the colon. No bowel obstruction or active inflammation. Normal appendix. 2. Fatty liver. 3. Cholelithiasis. 4. Right renal hypodense lesions as described. Further evaluation with MRI or ultrasound on a nonemergent basis recommended. Electronically Signed   By: Elgie Collard M.D.   On: 05/17/2018 02:42   Dg Abd Acute W/chest  Result Date: 05/17/2018 CLINICAL DATA:  77 year old female with abdominal distention. EXAM:  DG ABDOMEN ACUTE W/ 1V CHEST COMPARISON:  Abdominal CT dated 05/17/2018 FINDINGS: There is shallow inspiration with bibasilar atelectasis. No focal consolidation, pleural effusion or pneumothorax. Top-normal cardiac size. There is moderate colonic stool burden. No small bowel dilatation or evidence of obstruction. No free air or radiopaque calculi. Degenerative changes of the spine. IMPRESSION: 1. No acute cardiopulmonary process. 2. Moderate colonic stool burden. No bowel obstruction. Electronically Signed   By: Elgie Collard M.D.   On: 05/17/2018 02:22   Ct Angio Abd/pel W And/or Wo Contrast  Result Date: 05/17/2018 CLINICAL DATA:  77 year old female with abdominal pain and distention. Concern for mesenteric ischemia. EXAM: CTA ABDOMEN AND PELVIS wITHOUT AND WITH CONTRAST TECHNIQUE: Multidetector CT imaging of the abdomen and pelvis was performed using the standard protocol during bolus administration of intravenous contrast. Multiplanar reconstructed images and MIPs were obtained and reviewed to evaluate the vascular anatomy. CONTRAST:  80mL ISOVUE-370 IOPAMIDOL (ISOVUE-370) INJECTION 76% COMPARISON:  CT of the abdomen pelvis dated 05/17/2018 FINDINGS: VASCULAR Aorta: Moderate atherosclerotic disease of the aorta. No aneurysmal dilatation or dissection. Celiac: Patent without evidence of aneurysm, dissection, vasculitis or significant stenosis.  SMA: Atherosclerotic calcification of the origin of the SMA. The SMA is widely patent. Renals: Atherosclerotic calcification of the ostia of the left renal artery. The renal arteries are patent. IMA: Patent without evidence of aneurysm, dissection, vasculitis or significant stenosis. Inflow: Patent without evidence of aneurysm, dissection, vasculitis or significant stenosis. Proximal Outflow: Bilateral common femoral and visualized portions of the superficial and profunda femoral arteries are patent without evidence of aneurysm, dissection, vasculitis or significant  stenosis. Veins: No obvious venous abnormality within the limitations of this arterial phase study. No portal venous gas. Review of the MIP images confirms the above findings. NON-VASCULAR Lower chest: Bibasilar atelectasis/scarring. No intra-abdominal free air or free fluid. Hepatobiliary: Diffuse fatty infiltration of the liver. A 4.5 x 4.2 cm hypodense lesion in the right lobe of the liver with peripheral enhancement is not well characterized but may represent a hemangioma. Further characterization with MRI on a nonemergent basis recommended. No intrahepatic biliary ductal dilatation. There is a small stone in the gallbladder. No pericholecystic fluid. Pancreas: Unremarkable. No pancreatic ductal dilatation or surrounding inflammatory changes. Spleen: Normal in size without focal abnormality. Adrenals/Urinary Tract: The adrenal glands are unremarkable. Multiple right renal cysts measuring up to 5 cm in the inferior pole of the right kidney. There is no hydronephrosis on either side. There is symmetric enhancement of the kidneys bilaterally. The visualized ureters and urinary bladder appear unremarkable. Stomach/Bowel: There is moderate amount of air and stool throughout the colon. There is small sigmoid diverticula without active inflammatory changes. There is thickening of the splenic flexure and to a lesser degree the descending colon with mild surrounding inflammatory changes most consistent with colitis. No pneumatosis. There is no bowel obstruction. The appendix is normal. Lymphatic: No adenopathy. Reproductive: Hysterectomy. No pelvic mass. Other: Small fat containing umbilical hernia. Musculoskeletal: Degenerative changes of the spine. No acute osseous pathology. IMPRESSION: VASCULAR Moderate atherosclerotic disease of the aorta. No aneurysmal dilatation or dissection. No acute or significant arterial occlusion. NON-VASCULAR 1. No CT evidence of mesenteric ischemia. 2. Findings most consistent with colitis  involving the splenic flexure and to a lesser degree the descending colon. No bowel obstruction. Normal appendix. 3. Sigmoid diverticulosis without active inflammatory changes. 4. Fatty liver. 5. Indeterminate right hepatic hypodense lesion, likely a hemangioma. Further characterization with MRI on a nonemergent basis recommended. 6. Cholelithiasis. 7. Right renal cysts. No hydronephrosis. Electronically Signed   By: Elgie Collard M.D.   On: 05/17/2018 04:26      Jerre Simon , [facility] 05/18/2018, 8:49 AM  Pager: [phone] Mon-Wed, Friday 7:00am-4:30pm Thurs 7am-11:30am  Consults: [phone]

## 2018-05-18 NOTE — Progress Notes (Deleted)
[facility]  [phone] 4 out of 5 stars 4 out of 5 stars  [facility]  [phone]) [phone] 3 out of 5 stars 5 out of 5 stars  [facility]  [phone] 3 out of 5 stars 4 out of 5 stars  [facility]  [phone]) [phone] 4  out of 5 stars 4 out of 5 stars  [facility]  [phone]) [phone] 4  out of 5 stars 4 out of 5 stars  [facility]  [phone] 4 out of 5 stars 4 out of 5 stars  [facility]  [phone](336) [phone] 4 out of 5 stars 4 out of 5 stars  [facility]  [phone] 3 out of 5 stars 4 out of 5 stars  [facility]  [phone] 3  out of 5 stars Not Available11  [facility]  [phone]) [phone] 3 out of 5 stars 4 out of 5 stars  [facility]  [phone] 3  out of 5 stars 3 out of 5 stars  [facility]  [phone]) [phone] 5 out of 5 stars 3 out of 5 stars  [facility]  [phone]     [facility] list provided to patient.

## 2018-05-18 NOTE — Care Management Note (Addendum)
Case Management Note  Patient Details  Name: Amanda King MRN: [id number] Date of Birth: 1941-01-04  Subjective/Objective: 77 yo female presented with c/o abdominal pain. PMH: , hypothyroidism, HTN, HLD and DM                    Action/Plan: CM met with patient/family to discuss dispositional needs. Patient lives at home with her son, independent with ADLs, employed part-time at [employer] and has a RW/SPC which she indicated not using PTA. PCP verified as: Dr. Loura Pardon; pharmacy: [facility]. PT recommends HHPT with patient agreeable. CMS HH compare list provided to patient, with [facility] selected. [place] referral given to [place], [facility] liaison; AVS updated. Patient will need orders for HHPT and F2F. Patients family will provide transportation home. CM team will continue to follow.   Expected Discharge Date:  05/18/18               Expected Discharge Plan:  [place]  In-House Referral:  NA  Discharge planning Services  CM Consult  Post Acute Care Choice:  Home Health Choice offered to:  Patient  DME Arranged:  N/A DME Agency:  NA  HH Arranged:  PT HH Agency:  [facility]  Status of Service:  In process, will continue to follow  If discussed at Long Length of Stay Meetings, dates discussed:    Additional Comments:  Midge Minium RN, BSN, NCM-BC, ACM-RN [phone] 05/18/2018, 1:32 PM

## 2018-05-18 NOTE — Progress Notes (Signed)
Scanned pt bladder.  Only 92.  Peed on toilet.

## 2018-05-18 NOTE — Progress Notes (Signed)
Assumed care from Molly Maduroobert, [place]. Patient stable. Concerned for not been able to sleep. Attending contacted and sleeping aide was ordered. Given as prescribed. Will keep monitoring.

## 2018-05-18 NOTE — Evaluation (Signed)
Physical Therapy Evaluation Patient Details Name: Amanda King MRN: [id number] DOB: 05-May-1941 Today's Date: 05/18/2018   History of Present Illness  77 yo female with a hx of narcolepsy, hypothyroidism, HTN, HLD and DM who presented to the ED with complaints of abdominal pain  Clinical Impression  Orders received for PT evaluation. Patient demonstrates deficits in functional mobility as indicated below. Will benefit from continued skilled PT to address deficits and maximize function. Will see as indicated and progress as tolerated.  At this time, anticipate patient will need HHPT follow up due to deconditioning and weakness.    Follow Up Recommendations Home health PT;Supervision for mobility/OOB    Equipment Recommendations  None recommended by PT    Recommendations for Other Services       Precautions / Restrictions Precautions Precautions: Fall      Mobility  Bed Mobility Overal bed mobility: Needs Assistance Bed Mobility: Supine to Sit;Sit to Supine     Supine to sit: Min assist Sit to supine: Min assist   General bed mobility comments: increased time and effort to perform. min assist to elevate trunk to upright and to elevate LEs upon return to bed  Transfers Overall transfer level: Needs assistance Equipment used: 1 person hand held assist Transfers: Sit to/from Stand Sit to Stand: Min assist         General transfer comment: min assist for stability and power up to standing. Patient with posterior LOB  Ambulation/Gait Ambulation/Gait assistance: Min assist Gait Distance (Feet): 80 Feet Assistive device: 1 person hand held assist Gait Pattern/deviations: Step-through pattern;Decreased stride length;Shuffle;Wide base of support Gait velocity: decreased Gait velocity interpretation: <1.31 ft/sec, indicative of household ambulator General Gait Details: patient with noted instability reliance on UE support on one side and rail on opposite side. several LOB  due to weakness and fatigue  Stairs            Wheelchair Mobility    Modified Rankin (Stroke Patients Only)       Balance Overall balance assessment: Needs assistance Sitting-balance support: Feet supported Sitting balance-Leahy Scale: Fair     Standing balance support: During functional activity Standing balance-Leahy Scale: Poor Standing balance comment: relaince on external support                             Pertinent Vitals/Pain Pain Assessment: Faces Faces Pain Scale: Hurts little more Pain Location: abdominal pain Pain Descriptors / Indicators: Aching Pain Intervention(s): Monitored during session    Home Living Family/patient expects to be discharged to:: Private residence Living Arrangements: Children Available Help at Discharge: Family Type of Home: House Home Access: Level entry     Home Layout: One level Home Equipment: Environmental consultant - 2 wheels;Cane - single point      Prior Function Level of Independence: Independent         Comments: works at [employer], [employer]   Dominant Hand: Right    Extremity/Trunk Assessment   Upper Extremity Assessment Upper Extremity Assessment: Generalized weakness    Lower Extremity Assessment Lower Extremity Assessment: Generalized weakness       Communication   Communication: HOH  Cognition Arousal/Alertness: Awake/alert Behavior During Therapy: WFL for tasks assessed/performed Overall Cognitive Status: No family/caregiver present to determine baseline cognitive functioning  General Comments      Exercises     Assessment/Plan    PT Assessment Patient needs continued PT services  PT Problem List Decreased strength;Decreased activity tolerance;Decreased balance;Decreased mobility;Obesity;Pain       PT Treatment Interventions DME instruction;Gait training;Functional mobility training;Therapeutic  activities;Therapeutic exercise;Balance training;Patient/family education    PT Goals (Current goals can be found in the Care Plan section)  Acute Rehab PT Goals Patient Stated Goal: to feel better PT Goal Formulation: With patient Time For Goal Achievement: 06/01/18 Potential to Achieve Goals: Good    Frequency Min 3X/week   Barriers to discharge        Co-evaluation               AM-PAC PT "6 Clicks" Mobility  Outcome Measure Help needed turning from your back to your side while in a flat bed without using bedrails?: A Little Help needed moving from lying on your back to sitting on the side of a flat bed without using bedrails?: A Little Help needed moving to and from a bed to a chair (including a wheelchair)?: A Little Help needed standing up from a chair using your arms (e.g., wheelchair or bedside chair)?: A Little Help needed to walk in hospital room?: A Little Help needed climbing 3-5 steps with a railing? : A Lot 6 Click Score: 17    End of Session Equipment Utilized During Treatment: Gait belt Activity Tolerance: Patient limited by fatigue Patient left: in bed;with call bell/phone within reach Nurse Communication: Mobility status PT Visit Diagnosis: Muscle weakness (generalized) (M62.81);Difficulty in walking, not elsewhere classified (R26.2)    Time: 1131-1150 PT Time Calculation (min) (ACUTE ONLY): 19 min   Charges:   PT Evaluation $PT Eval Moderate Complexity: 1 Mod          Charlotte Crumbevon Rosalene Wardrop, PT DPT  Board Certified Neurologic Specialist Acute Rehabilitation Services Pager [phone] Office [phone]   Fabio AsaDevon J Gordy Goar 05/18/2018, 11:55 AM

## 2018-05-19 ENCOUNTER — Inpatient Hospital Stay (HOSPITAL_COMMUNITY): Payer: Medicare Other

## 2018-05-19 DIAGNOSIS — E1165 Type 2 diabetes mellitus with hyperglycemia: Secondary | ICD-10-CM

## 2018-05-19 DIAGNOSIS — I10 Essential (primary) hypertension: Secondary | ICD-10-CM

## 2018-05-19 DIAGNOSIS — K5900 Constipation, unspecified: Secondary | ICD-10-CM

## 2018-05-19 DIAGNOSIS — K529 Noninfective gastroenteritis and colitis, unspecified: Secondary | ICD-10-CM

## 2018-05-19 DIAGNOSIS — G47411 Narcolepsy with cataplexy: Secondary | ICD-10-CM

## 2018-05-19 DIAGNOSIS — D649 Anemia, unspecified: Secondary | ICD-10-CM

## 2018-05-19 DIAGNOSIS — K567 Ileus, unspecified: Secondary | ICD-10-CM

## 2018-05-19 LAB — BASIC METABOLIC PANEL
Anion gap: 10 (ref 5–15)
BUN: 18 mg/dL (ref 8–23)
CO2: 21 mmol/L — ABNORMAL LOW (ref 22–32)
CREATININE: 1.07 mg/dL — AB (ref 0.44–1.00)
Calcium: 6.9 mg/dL — ABNORMAL LOW (ref 8.9–10.3)
Chloride: 104 mmol/L (ref 98–111)
GFR calc Af Amer: 58 mL/min — ABNORMAL LOW (ref >60)
GFR calc non Af Amer: 50 mL/min — ABNORMAL LOW (ref >60)
Glucose, Bld: 196 mg/dL — ABNORMAL HIGH (ref 70–99)
Potassium: 3.7 mmol/L (ref 3.5–5.1)
SODIUM: 135 mmol/L (ref 135–145)

## 2018-05-19 LAB — CBC
HCT: 31.5 % — ABNORMAL LOW (ref 36.0–46.0)
Hemoglobin: 9.8 g/dL — ABNORMAL LOW (ref 12.0–15.0)
MCH: 29.3 pg (ref 26.0–34.0)
MCHC: 31.1 g/dL (ref 30.0–36.0)
MCV: 94 fL (ref 80.0–100.0)
Platelets: 217 10*3/uL (ref 150–400)
RBC: 3.35 MIL/uL — ABNORMAL LOW (ref 3.87–5.11)
RDW: 13.2 % (ref 11.5–15.5)
WBC: 16.4 10*3/uL — ABNORMAL HIGH (ref 4.0–10.5)
nRBC: 0 % (ref 0.0–0.2)

## 2018-05-19 LAB — GLUCOSE, CAPILLARY
Glucose-Capillary: 160 mg/dL — ABNORMAL HIGH (ref 70–99)
Glucose-Capillary: 166 mg/dL — ABNORMAL HIGH (ref 70–99)
Glucose-Capillary: 209 mg/dL — ABNORMAL HIGH (ref 70–99)
Glucose-Capillary: 189 mg/dL — ABNORMAL HIGH (ref 70–99)

## 2018-05-19 LAB — MAGNESIUM: Magnesium: 1.7 mg/dL (ref 1.7–2.4)

## 2018-05-19 MED ORDER — INSULIN ASPART 100 UNIT/ML ~~LOC~~ SOLN
0.0000 [IU] | Freq: Three times a day (TID) | SUBCUTANEOUS | Status: DC
  Administered 2018-05-19: 2 [IU] via SUBCUTANEOUS
  Administered 2018-05-19: 3 [IU] via SUBCUTANEOUS
  Administered 2018-05-19 – 2018-05-22 (×6): 2 [IU] via SUBCUTANEOUS
  Administered 2018-05-22: 3 [IU] via SUBCUTANEOUS
  Administered 2018-05-22 – 2018-05-24 (×5): 2 [IU] via SUBCUTANEOUS

## 2018-05-19 MED ORDER — IMIPRAMINE HCL 50 MG PO TABS
50.0000 mg | ORAL_TABLET | Freq: Every day | ORAL | Status: DC
  Administered 2018-05-19 – 2018-05-23 (×5): 50 mg via ORAL
  Filled 2018-05-19 (×7): qty 1

## 2018-05-19 MED ORDER — METHYLPHENIDATE HCL 5 MG PO TABS: 20.0000 mg | ORAL_TABLET | Freq: Three times a day (TID) | ORAL | Status: DC

## 2018-05-19 MED ORDER — METHYLPHENIDATE HCL 5 MG PO TABS
20.0000 mg | ORAL_TABLET | Freq: Three times a day (TID) | ORAL | Status: DC
  Administered 2018-05-19 – 2018-05-24 (×11): 20 mg via ORAL
  Filled 2018-05-19 (×11): qty 4

## 2018-05-19 MED ORDER — BOOST / RESOURCE BREEZE PO LIQD CUSTOM
1.0000 | Freq: Three times a day (TID) | ORAL | Status: DC
  Administered 2018-05-19 – 2018-05-24 (×10): 1 via ORAL
  Filled 2018-05-19 (×18): qty 1

## 2018-05-19 MED ORDER — LISINOPRIL 20 MG PO TABS
20.0000 mg | ORAL_TABLET | Freq: Every day | ORAL | Status: DC
  Administered 2018-05-19 – 2018-05-24 (×5): 20 mg via ORAL
  Filled 2018-05-19 (×5): qty 1

## 2018-05-19 MED ORDER — LEVOTHYROXINE SODIUM 88 MCG PO TABS
88.0000 ug | ORAL_TABLET | Freq: Every day | ORAL | Status: DC
  Administered 2018-05-20 – 2018-05-23 (×4): 88 ug via ORAL
  Filled 2018-05-19 (×3): qty 1

## 2018-05-19 MED ORDER — SODIUM CHLORIDE 0.9 % IV BOLUS
1000.0000 mL | Freq: Once | INTRAVENOUS | Status: AC
  Administered 2018-05-19: 1000 mL via INTRAVENOUS

## 2018-05-19 MED ORDER — BISACODYL 10 MG RE SUPP
10.0000 mg | Freq: Four times a day (QID) | RECTAL | Status: AC
  Administered 2018-05-19 – 2018-05-20 (×3): 10 mg via RECTAL
  Filled 2018-05-19 (×3): qty 1

## 2018-05-19 NOTE — Progress Notes (Signed)
[facility]/Trauma Progress Note      Assessment/Plan Diabetes type 2, uncontrolled (HCC) Hyperlipidemia LDL goal <100 Essential hypertension Narcolepsy with cataplexy  Abdominal pain likely 2/2 colitis involving the splenic flexure and proximal descending colon - CTA did not show any indication of ischemia - she does not have an acute abdomen and there is no indication for surgical intervention at this time, we will follow - AXR showed likely ileus  FEN:FLD, boost VTE: SCD's, lovenox ZO:XWRUE & Flagyl   WBC up to 16.4, afebrile Foley:yes for urinary retention  Follow up:TBD  Plan:conservative management, encourage ambulation   LOS: 1 day    Subjective: CC: abdominal tightness  Pt is feeling better but still feels bloated, belly feels tight. She had a BM after the enema yesterday and felt a little better. She is still having flatus and tolerating clears. No real appetite. Sister in law at bedside.   Objective: Vital signs in last 24 hours: Temp:  [97.4 F (36.3 C)-98.7 F (37.1 C)] 98.7 F (37.1 C) (12/11 0728) Pulse Rate:  [101-110] 101 (12/11 0728) Resp:  [16-24] 16 (12/11 0728) BP: (119-156)/(52-69) 123/56 (12/11 0728) SpO2:  [89 %-94 %] 89 % (12/11 0728) Weight:  [73 kg] 73 kg (12/10 2108) Last BM Date: 05/18/18  Intake/Output from previous day: 12/10 0701 - 12/11 0700 In: 3520.5 [P.O.:240; I.V.:2000; IV Piggyback:1280.5] Out: 492 [Urine:492] Intake/Output this shift: No intake/output data recorded.  PE: Gen:  Alert, NAD, pleasant, cooperative Pulm:  Rate and effort normal Abd: Soft.moderate distention. Normal BS, No TTP. No peritonitis. There isno rigidityand no guarding Skin: no rashes noted, warm and dry  Anti-infectives: Anti-infectives (From admission, onward)   Start     Dose/Rate Route Frequency Ordered Stop   05/17/18 0600  ciprofloxacin (CIPRO) IVPB 400 mg     400 mg 200 mL/hr over 60 Minutes Intravenous Every  12 hours 05/17/18 0503     05/17/18 0500  cefTRIAXone (ROCEPHIN) 2 g in sodium chloride 0.9 % 100 mL IVPB  Status:  Discontinued    Note to Pharmacy:  PCN allergy is GI upset--changing Levaquin to Rocephin 2 g IV as per consult   2 g 200 mL/hr over 30 Minutes Intravenous  Once 05/17/18 0452 05/17/18 0453   05/17/18 0445  levofloxacin (LEVAQUIN) IVPB 750 mg  Status:  Discontinued     750 mg 100 mL/hr over 90 Minutes Intravenous  Once 05/17/18 0434 05/17/18 0452   05/17/18 0445  metroNIDAZOLE (FLAGYL) IVPB 500 mg     500 mg 100 mL/hr over 60 Minutes Intravenous Every 8 hours 05/17/18 0434        Lab Results:  Recent Labs    05/18/18 0509 05/19/18 0557  WBC 15.5* 16.4*  HGB 10.7* 9.8*  HCT 32.9* 31.5*  PLT 207 217   BMET Recent Labs    05/18/18 0509 05/19/18 0557  NA 138 135  K 3.5 3.7  CL 104 104  CO2 24 21*  GLUCOSE 209* 196*  BUN 19 18  CREATININE 1.04* 1.07*  CALCIUM 6.9* 6.9*   PT/INR No results for input(s): LABPROT, INR in the last 72 hours. CMP     Component Value Date/Time   NA 135 05/19/2018 0557   K 3.7 05/19/2018 0557   CL 104 05/19/2018 0557   CO2 21 (L) 05/19/2018 0557   GLUCOSE 196 (H) 05/19/2018 0557   BUN 18 05/19/2018 0557   CREATININE 1.07 (H) 05/19/2018 0557   CALCIUM 6.9 (L) 05/19/2018 4540  PROT 6.9 05/17/2018 0112   ALBUMIN 4.3 05/17/2018 0112   AST 24 05/17/2018 0112   ALT 30 05/17/2018 0112   ALKPHOS 81 05/17/2018 0112   BILITOT 0.5 05/17/2018 0112   GFRNONAA 50 (L) 05/19/2018 0557   GFRAA 58 (L) 05/19/2018 0557   Lipase     Component Value Date/Time   LIPASE 54 (H) 05/17/2018 0112    Studies/Results: Dg Abd 1 View  Result Date: 05/18/2018 CLINICAL DATA:  Abdominal pain, distention EXAM: ABDOMEN - 1 VIEW COMPARISON:  05/17/2018 FINDINGS: Mild gaseous distention of both large and small bowel loops. Moderate to large stool burden in the colon. No organomegaly or free air. IMPRESSION: Mild diffuse gaseous distention of both  large and small bowel may reflect ileus. Moderate to large stool burden. Electronically Signed   By: Charlett NoseKevin  Dover M.D.   On: 05/18/2018 10:23      Jerre SimonJessica L Layton Tappan , [facility] 05/19/2018, 10:18 AM  Pager: [phone] Mon-Wed, Friday 7:00am-4:30pm Thurs 7am-11:30am  Consults: [phone]

## 2018-05-19 NOTE — Progress Notes (Signed)
Physical Therapy Treatment Patient Details Name: Amanda SparrowBrenda C Kilfoyle MRN: [id number] DOB: Aug 14, 1940 Today's Date: 05/19/2018    History of Present Illness 77 yo female with a hx of narcolepsy, hypothyroidism, HTN, HLD and DM who presented to the ED with complaints of abdominal pain    PT Comments    Continuing work on functional mobility and activity tolerance;  Used rW today for amb as pt tended to reach out for UE support; Steady improvements   Follow Up Recommendations  Home health PT;Supervision for mobility/OOB     Equipment Recommendations  Rolling walker with 5" wheels(she may already have one)    Recommendations for Other Services       Precautions / Restrictions Precautions Precautions: Fall    Mobility  Bed Mobility Overal bed mobility: Needs Assistance Bed Mobility: Supine to Sit;Sit to Supine     Supine to sit: Min guard Sit to supine: Supervision   General bed mobility comments: increased time and effort to perform. Used bed rails, but no physical assist  Transfers Overall transfer level: Needs assistance Equipment used: Rolling walker (2 wheeled) Transfers: Sit to/from Stand Sit to Stand: Min guard         General transfer comment: Opted to use RW as she tended to reach out for bedside table to stand; cues for hand placement  Ambulation/Gait Ambulation/Gait assistance: Min guard Gait Distance (Feet): 100 Feet Assistive device: Rolling walker (2 wheeled) Gait Pattern/deviations: Step-through pattern;Decreased stride length;Shuffle;Wide base of support Gait velocity: decreased   General Gait Details: Walked with RW today with smoother steps; cues to self-monitor for activity tolernace   Stairs             Wheelchair Mobility    Modified Rankin (Stroke Patients Only)       Balance     Sitting balance-Leahy Scale: Fair       Standing balance-Leahy Scale: Poor Standing balance comment: relaince on external support                             Cognition Arousal/Alertness: Awake/alert Behavior During Therapy: WFL for tasks assessed/performed Overall Cognitive Status: No family/caregiver present to determine baseline cognitive functioning                                        Exercises      General Comments        Pertinent Vitals/Pain Pain Assessment: Faces Faces Pain Scale: Hurts a little bit Pain Location: abdominal pain Pain Descriptors / Indicators: Aching Pain Intervention(s): Monitored during session    Home Living                      Prior Function            PT Goals (current goals can now be found in the care plan section) Acute Rehab PT Goals Patient Stated Goal: to feel better PT Goal Formulation: With patient Time For Goal Achievement: 06/01/18 Potential to Achieve Goals: Good Progress towards PT goals: Progressing toward goals    Frequency    Min 3X/week      PT Plan Current plan remains appropriate    Co-evaluation              AM-PAC PT "6 Clicks" Mobility   Outcome Measure  Help needed turning from your back to your  side while in a flat bed without using bedrails?: None Help needed moving from lying on your back to sitting on the side of a flat bed without using bedrails?: None Help needed moving to and from a bed to a chair (including a wheelchair)?: A Little Help needed standing up from a chair using your arms (e.g., wheelchair or bedside chair)?: A Little Help needed to walk in hospital room?: A Little Help needed climbing 3-5 steps with a railing? : A Little 6 Click Score: 20    End of Session Equipment Utilized During Treatment: Gait belt Activity Tolerance: Patient limited by fatigue Patient left: in bed;with call bell/phone within reach Nurse Communication: Mobility status PT Visit Diagnosis: Muscle weakness (generalized) (M62.81);Difficulty in walking, not elsewhere classified (R26.2)     Time: 1610-9604 PT  Time Calculation (min) (ACUTE ONLY): 19 min  Charges:  $Gait Training: 8-22 mins                     Van Clines, [place]  Acute Rehabilitation Services Pager [phone] Office [phone]    Levi Aland 05/19/2018, 4:14 PM

## 2018-05-19 NOTE — Progress Notes (Addendum)
PROGRESS NOTE    Amanda King   GNF:[id number]  DOB: 10/17/1940  DOA: 05/17/2018 PCP: Judy Pimple, MD   Brief Narrative:  Amanda King is a 77 y.o. female with medical history significant of narcolepsy with cataplexy; migraine; hypothyroidism; HTN; HLD; and DM presenting with abdominal pain and nausea. No vomiting.  In ED > pain 7/10, stomach distended, tympanic CT abd/pelvis w/o contrast in ED >  1. Moderate stool and air in the colon. No bowel obstruction or active inflammation. Normal appendix. 2. Fatty liver. 3. Cholelithiasis. 4. Right renal hypodense lesions as described. Further evaluation with MRI or ultrasound on a nonemergent basis recommended.  CTA abd pelvis> new information>  No CT evidence of mesenteric ischemia.-  Findings most consistent with colitis involving the splenic flexure and to a lesser degree the descending colon. Sigmoid diverticulosis without active inflammatory changes.   Subjective: Mild nausea, no vomiting. No pain.    Assessment & Plan:   Principal Problem:  Colitis with ileus Leukocytosis - large amount of stool on imaging  - ? Stercoral colitis vs infectious- cont antibiotics for now- - have asked for a GI opinion as well  - she has had some BMs but still very distended and xray shows stool in in colon and rectum- mild nausea- no vomiting - I have ordered serial Dulcolax suppositories -on clear liquids and IVF  Active Problems: Dehydration- urinary retention - foley was required (likely retaining due to constipation) - poor oral intake recent and thus poor urine output- give fluid bolus and cont IVF  - will give voiding trial once above issues resolve - hold Imipramine, HCTZ for now  CKD 3 - stable  Lactic acidosis  - likely due to dehydration - resolved    Diabetes type 2, uncontrolled  - SSI- hold Glipizide, Januvia and Metformin - Her last A1c was 8.0 on 10/1      Essential hypertension - HCTZ & Lisinopril on hold-      Narcolepsy with cataplexy - cont Imipramine and Ritalin      DVT prophylaxis: Lovenox Code Status: DNR Family Communication: daughter  Disposition Plan: cont to follow on med/surg Consultants:   gen surgery Procedures:   none Antimicrobials:  Anti-infectives (From admission, onward)   Start     Dose/Rate Route Frequency Ordered Stop   05/17/18 0600  ciprofloxacin (CIPRO) IVPB 400 mg     400 mg 200 mL/hr over 60 Minutes Intravenous Every 12 hours 05/17/18 0503     05/17/18 0500  cefTRIAXone (ROCEPHIN) 2 g in sodium chloride 0.9 % 100 mL IVPB  Status:  Discontinued    Note to Pharmacy:  PCN allergy is GI upset--changing Levaquin to Rocephin 2 g IV as per consult   2 g 200 mL/hr over 30 Minutes Intravenous  Once 05/17/18 0452 05/17/18 0453   05/17/18 0445  levofloxacin (LEVAQUIN) IVPB 750 mg  Status:  Discontinued     750 mg 100 mL/hr over 90 Minutes Intravenous  Once 05/17/18 0434 05/17/18 0452   05/17/18 0445  metroNIDAZOLE (FLAGYL) IVPB 500 mg     500 mg 100 mL/hr over 60 Minutes Intravenous Every 8 hours 05/17/18 0434         Objective: Vitals:   05/18/18 1725 05/18/18 2108 05/19/18 0520 05/19/18 0728  BP: (!) 134/53 (!) 156/62 133/69 (!) 123/56  Pulse: (!) 109 (!) 110 (!) 104 (!) 101  Resp: 18 18 18 16   Temp: (!) 97.4 F (36.3 C) 98 F (36.7 C)  98 F (36.7 C) 98.7 F (37.1 C)  TempSrc: Oral   Oral  SpO2: 90% 94% 90% (!) 89%  Weight:  73 kg    Height:        Intake/Output Summary (Last 24 hours) at 05/19/2018 1413 Last data filed at 05/19/2018 0900 Gross per 24 hour  Intake 3160 ml  Output 400 ml  Net 2760 ml   Filed Weights   05/17/18 1627 05/18/18 2108  Weight: 73 kg 73 kg    Examination: General exam: Appears comfortable  HEENT: PERRLA, oral mucosa moist, no sclera icterus or thrush Respiratory system: Clear to auscultation. Respiratory effort normal. Cardiovascular system: S1 & S2 heard, RRR.   Gastrointestinal system: Abdomen soft,  non-tender, very distended, slow bowel sounds, tympanic. Central nervous system: Alert and oriented. No focal neurological deficits. Extremities: No cyanosis, clubbing or edema Skin: No rashes or ulcers Psychiatry:  Mood & affect appropriate.     Data Reviewed: I have personally reviewed following labs and imaging studies  CBC: Recent Labs  Lab 05/17/18 0112 05/18/18 0509 05/19/18 0557  WBC 17.8* 15.5* 16.4*  HGB 14.0 10.7* 9.8*  HCT 43.3 32.9* 31.5*  MCV 91.7 91.6 94.0  PLT 296 207 217   Basic Metabolic Panel: Recent Labs  Lab 05/17/18 0112 05/18/18 0509 05/19/18 0557  NA 136 138 135  K 3.8 3.5 3.7  CL 96* 104 104  CO2 30 24 21*  GLUCOSE 276* 209* 196*  BUN 24* 19 18  CREATININE 1.24* 1.04* 1.07*  CALCIUM 8.6* 6.9* 6.9*  MG  --   --  1.7   GFR: Estimated Creatinine Clearance: 41 mL/min (A) (by C-G formula based on SCr of 1.07 mg/dL (H)). Liver Function Tests: Recent Labs  Lab 05/17/18 0112  AST 24  ALT 30  ALKPHOS 81  BILITOT 0.5  PROT 6.9  ALBUMIN 4.3   Recent Labs  Lab 05/17/18 0112  LIPASE 54*   No results for input(s): AMMONIA in the last 168 hours. Coagulation Profile: No results for input(s): INR, PROTIME in the last 168 hours. Cardiac Enzymes: No results for input(s): CKTOTAL, CKMB, CKMBINDEX, TROPONINI in the last 168 hours. BNP (last 3 results) No results for input(s): PROBNP in the last 8760 hours. HbA1C: No results for input(s): HGBA1C in the last 72 hours. CBG: Recent Labs  Lab 05/18/18 1210 05/18/18 1625 05/18/18 2108 05/19/18 0727 05/19/18 1134  GLUCAP 149* 155* 155* 160* 166*   Lipid Profile: No results for input(s): CHOL, HDL, LDLCALC, TRIG, CHOLHDL, LDLDIRECT in the last 72 hours. Thyroid Function Tests: No results for input(s): TSH, T4TOTAL, FREET4, T3FREE, THYROIDAB in the last 72 hours. Anemia Panel: No results for input(s): VITAMINB12, FOLATE, FERRITIN, TIBC, IRON, RETICCTPCT in the last 72 hours. Urine analysis:     Component Value Date/Time   COLORURINE YELLOW 05/17/2018 0350   APPEARANCEUR CLEAR 05/17/2018 0350   LABSPEC 1.025 05/17/2018 0350   PHURINE 5.0 05/17/2018 0350   GLUCOSEU 150 (A) 05/17/2018 0350   HGBUR NEGATIVE 05/17/2018 0350   BILIRUBINUR NEGATIVE 05/17/2018 0350   BILIRUBINUR small 11/24/2011 1030   KETONESUR NEGATIVE 05/17/2018 0350   PROTEINUR NEGATIVE 05/17/2018 0350   UROBILINOGEN 0.2 11/24/2011 1030   NITRITE NEGATIVE 05/17/2018 0350   LEUKOCYTESUR TRACE (A) 05/17/2018 0350   Sepsis Labs: @LABRCNTIP (procalcitonin:4,lacticidven:4) ) Recent Results (from the past 240 hour(s))  Blood Culture (routine x 2)     Status: None (Preliminary result)   Collection Time: 05/17/18  4:46 AM  Result Value Ref Range  Status   Specimen Description BLOOD RIGHT ANTECUBITAL  Final   Special Requests   Final    BOTTLES DRAWN AEROBIC AND ANAEROBIC Blood Culture results may not be optimal due to an inadequate volume of blood received in culture bottles   Culture   Final    NO GROWTH 2 DAYS Performed at [facility], [address], [place], [place] [address]    Report Status PENDING  Incomplete  Blood Culture (routine x 2)     Status: None (Preliminary result)   Collection Time: 05/17/18  4:47 AM  Result Value Ref Range Status   Specimen Description BLOOD RIGHT UPPER ARM  Final   Special Requests   Final    BOTTLES DRAWN AEROBIC AND ANAEROBIC Blood Culture results may not be optimal due to an inadequate volume of blood received in culture bottles   Culture   Final    NO GROWTH 2 DAYS Performed at [facility], [address], [place], [place] [address]    Report Status PENDING  Incomplete         Radiology Studies: Dg Abd 1 View  Result Date: 05/18/2018 CLINICAL DATA:  Abdominal pain, distention EXAM: ABDOMEN - 1 VIEW COMPARISON:  05/17/2018 FINDINGS: Mild gaseous distention of both large and small bowel loops. Moderate to large stool burden in the colon. No  organomegaly or free air. IMPRESSION: Mild diffuse gaseous distention of both large and small bowel may reflect ileus. Moderate to large stool burden. Electronically Signed   By: Charlett Nose M.D.   On: 05/18/2018 10:23   Dg Abd Portable 1v  Result Date: 05/19/2018 CLINICAL DATA:  Constipation. Follow-up following to enemas yesterday. EXAM: PORTABLE ABDOMEN - 1 VIEW COMPARISON:  Upright view of the upper abdomen of May 18, 2018 FINDINGS: There remains considerable stool throughout the colon and rectum. There's some large bowel gas. There's also gas within loops of minimally distended and normal caliber small bowel. No free extraluminal gas collections are observed. There's some gas in the stomach. No abnormal soft tissue calcifications are observed. There's curvature of the lumbar spine convex toward the left which is stable. IMPRESSION: Persistent large colonic and rectal stool burdens. Persistent small bowel gas within normal caliber and some mildly dilated small bowel loops likely secondary to the colonic stool burden and relative stasis. Electronically Signed   By: David  [place]and M.D.   On: 05/19/2018 11:16      Scheduled Meds: . enoxaparin (LOVENOX) injection  40 mg Subcutaneous Q24H  . feeding supplement  1 Container Oral TID BM  . imipramine  50 mg Oral QHS  . insulin aspart  0-9 Units Subcutaneous TID WC  . levothyroxine  44 mcg Intravenous Daily  . mouth rinse  15 mL Mouth Rinse BID  . polyethylene glycol  17 g Oral Daily   Continuous Infusions: . 0.9 % NaCl with KCl 20 mEq / L 100 mL/hr at 05/18/18 1257  . ciprofloxacin 400 mg (05/19/18 0504)  . metronidazole 500 mg (05/19/18 1151)     LOS: 1 day    Time spent in minutes: 35    Calvert Cantor, MD [facility] Pager: [email or web address] Password [email or web address] 05/19/2018, 2:13 PM

## 2018-05-19 NOTE — Consult Note (Addendum)
[facility] Consult: 2:35 PM 05/19/2018  LOS: 1 day    Referring Provider: Dr Butler Denmark  Primary Care Physician:  Tower, Audrie Gallus, MD Primary Gastroenterologist:      Reason for Consultation:  Constipation, ? Colitis.   HPI: Amanda King is a 77 y.o. female.  PMH narcolepsy.  Htn.  DM 2.  PNA. Hypothyroidism. S/p c spine surgery, appy, partial hysterectomy.   Patient recalls colonoscopy many years ago but there are no endoscopic or pathology documents in epic.    2 days ago, the patient developed acute abdominal pain around 10 PM.  Nauseated but not vomiting.  No fever or chills.  No antecedent symptoms.  Bowel habits stable, moves her bowels every 1 to 2 days.  No bleeding per rectum, no melena.  Felt a bit weak but no dizziness. Over the course of hospitalization Hgb is gone from 14 >> 9.8.  It was 12.8 on 03/09/2018.  MCV 91 WBCs 17.8 >> 16.4 Lipase 54, LFTs normal  Noncontrast CT abdomen pelvis 05/17/2018 showed fatty liver, gallstone.  Moderate air and stool in the colon without obstruction.  Advanced aortoiliac atherosclerosis.  Right renal lesion, need further eval with ultrasound or MRI CT angiogram abdomen/pelvis 05/17/2018 colitis involving splenic flexure and, to a lesser degree, the descending colon.  Sigmoid diverticulosis.  Fatty liver.  Right hepatic lesion, likely hemangioma.  Cholelithiasis, right renal cyst.  Celiac and IMA patent.  SMA patent despite calcification at its origin.     Day 3 IV Cipro/Flagyl. Had a large bowel movement, solid and liquid nonbloody stool after enema yesterday morning.  Stool was not sent for any studies.  She has been started on Dulcolax suppositories, 1 every 6 hours for 3 doses starting this afternoon. There is still abdominal pain though it is improved.  Not  currently nauseated.  Has never vomited.  At home patient has bowel movements every 1 to 2 days.  She eats well.  No nausea.  No dysphagia.  No heartburn.  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  Still drives, cooks, Lexicographer.  She lives with her son in [place]. Quit smoking and drinking alcohol 40+ years ago.  Does not use NSAIDs.  No aspirin products other than 81 mg aspirin daily. No issues with vigorous bleeding or bruising.  No history of blood transfusions.  Past Medical History:  Diagnosis Date  . Diabetes mellitus    Type II  . Hyperlipidemia   . Hypertension   . Hypothyroidism   . Migraine   . Narcolepsy with cataplexy   . Pneumonia 08/2001    Past Surgical History:  Procedure Laterality Date  . APPENDECTOMY  age 70  . hemilaminectomy     C5-6; C6-7  . PARTIAL HYSTERECTOMY  age 67  . POSTERIOR CERVICAL FUSION/FORAMINOTOMY  02/05/1975   C5-C6-C7    Prior to Admission medications   Medication Sig Start Date End Date Taking? Authorizing Provider  aspirin 81 MG tablet Take 81 mg by mouth daily.  Yes [provider]  cetirizine (ZYRTEC) 10 MG tablet Take 10 mg by mouth daily as needed for allergies or rhinitis.    Yes [provider]  glipiZIDE (GLUCOTROL XL) 10 MG 24 hr tablet Take 1 tablet (10 mg total) by mouth daily with breakfast. 03/30/18  Yes Tower, Audrie Gallus, MD  hydrochlorothiazide (HYDRODIURIL) 25 MG tablet Take 1 tablet (25 mg total) by mouth daily. 03/30/18  Yes Tower, Audrie Gallus, MD  ibuprofen (ADVIL,MOTRIN) 200 MG tablet Take 200 mg by mouth every 6 (six) hours as needed for headache or mild pain.    Yes [provider]  imipramine (TOFRANIL) 50 MG tablet Take 1 tablet (50 mg total) by mouth at bedtime. 10/06/17  Yes Dohmeier, Porfirio Mylar, MD  levothyroxine (SYNTHROID, LEVOTHROID) 88 MCG tablet Take 1 tablet (88 mcg total) by mouth daily. 03/30/18  Yes Tower, Marne A, MD  lisinopril (PRINIVIL,ZESTRIL)  20 MG tablet TAKE 1 TABLET BY MOUTH ONCE DAILY Patient taking differently: Take 20 mg by mouth daily.  03/16/18  Yes Tower, Audrie Gallus, MD  metFORMIN (GLUCOPHAGE-XR) 750 MG 24 hr tablet Take 1 tablet (750 mg total) by mouth daily with breakfast. 03/30/18  Yes Tower, Audrie Gallus, MD  methylphenidate (RITALIN) 20 MG tablet Take 1 tablet (20 mg total) by mouth 3 (three) times daily with meals. 04/14/18  Yes Dohmeier, Porfirio Mylar, MD  simvastatin (ZOCOR) 80 MG tablet Take 1 tablet (80 mg total) by mouth at bedtime. 03/30/18  Yes Tower, Marne A, MD  sitaGLIPtin (JANUVIA) 100 MG tablet Take 1 tablet (100 mg total) by mouth daily. 03/30/18  Yes Tower, Audrie Gallus, MD    Scheduled Meds: . bisacodyl  10 mg Rectal Q6H  . enoxaparin (LOVENOX) injection  40 mg Subcutaneous Q24H  . feeding supplement  1 Container Oral TID BM  . imipramine  50 mg Oral QHS  . insulin aspart  0-9 Units Subcutaneous TID WC  . levothyroxine  44 mcg Intravenous Daily  . mouth rinse  15 mL Mouth Rinse BID  . polyethylene glycol  17 g Oral Daily   Infusions: . 0.9 % NaCl with KCl 20 mEq / L 100 mL/hr at 05/18/18 1257  . ciprofloxacin 400 mg (05/19/18 0504)  . metronidazole 500 mg (05/19/18 1151)   PRN Meds: acetaminophen **OR** acetaminophen, bisacodyl, morphine injection, ondansetron **OR** ondansetron (ZOFRAN) IV   Allergies as of 05/17/2018 - Review Complete 05/17/2018  Allergen Reaction Noted  . Penicillins Other (See Comments) 04/02/2007    Family History  Problem Relation Age of Onset  . Depression Brother        suicide  . Diabetes Brother   . Heart disease Mother        MI  . Peripheral vascular disease Mother   . Hyperlipidemia Mother   . Diabetes Brother   . Sleep apnea Son   . Sleep apnea Brother     Social History   Socioeconomic History  . Marital status: Widowed    Spouse name: Not on file  . Number of children: 3  . Years of education: GED  . Highest education level: Not on file  Occupational History    . Occupation: works 1 day a week  Social Needs  . Financial resource strain: Not on file  . Food insecurity:    Worry: Not on file    Inability: Not on file  . Transportation needs:    Medical: Not on file    Non-medical: Not on file  Tobacco Use  .  Smoking status: Former Smoker    Last attempt to quit: 06/09/1980    Years since quitting: 37.9  . Smokeless tobacco: Never Used  Substance and Sexual Activity  . Alcohol use: No    Alcohol/week: 0.0 standard drinks  . Drug use: No  . Sexual activity: Never  Lifestyle  . Physical activity:    Days per week: Not on file    Minutes per session: Not on file  . Stress: Not on file  Relationships  . Social connections:    Talks on phone: Not on file    Gets together: Not on file    Attends religious service: Not on file    Active member of club or organization: Not on file    Attends meetings of clubs or organizations: Not on file    Relationship status: Not on file  . Intimate partner violence:    Fear of current or ex partner: Not on file    Emotionally abused: Not on file    Physically abused: Not on file    Forced sexual activity: Not on file  Other Topics Concern  . Not on file  Social History Narrative   Patient is widowed. and her son lives with her.   Walks for exercise.     Patient has three children.   Patient is right-handed.   Patient drinks three cups of caffeine daily.     Patient is retired.   Patient has a GED.    REVIEW OF SYSTEMS: Constitutional: Currently she feels a bit weak, malaise but this is unusual for her. ENT:  No nose bleeds Pulm: Stable dyspnea on exertion if she walks fast or has to climb more than a few stairs.  No cough. CV:  No palpitations, no LE edema.  No chest pain. GU:  No hematuria, no frequency GI:  Per HPI Heme:  Per HPI   Transfusions:  None ever.   Neuro:  No headaches, no peripheral tingling or numbness.  No dizziness. Derm:  No itching, no rash or sores.  Endocrine:  No  sweats or chills.  No polyuria or dysuria Immunization: Due to multiple vaccinations, up-to-date on her flu shot. Travel:  None beyond local counties in last few months.    PHYSICAL EXAM: Vital signs in last 24 hours: Vitals:   05/19/18 0520 05/19/18 0728  BP: 133/69 (!) 123/56  Pulse: (!) 104 (!) 101  Resp: 18 16  Temp: 98 F (36.7 C) 98.7 F (37.1 C)  SpO2: 90% (!) 89%   Wt Readings from Last 3 Encounters:  05/18/18 73 kg  04/14/18 73.3 kg  03/30/18 73.3 kg    General: Overweight, pleasant, somewhat anxious elderly WF. Head: No facial asymmetry or swelling.  No signs of head trauma. Eyes: No scleral icterus, no conjunctival pallor.  EOMI. Ears: Not hard of hearing Nose: No congestion, no discharge Mouth: Oropharynx with slightly dry tongue.  Mucosa pink, moist.  Tongue midline. Neck: No JVD, no masses, no thyromegaly. Lungs: Clear bilaterally.  No cough.  Slightly labored breathing. Heart: Regular rhythm.  Slightly tacky in the low 100s.  Sinus tachycardia on telemetry monitor.  No MRG.  S1, S2 present. Abdomen: Soft.  Obese.  Distended.  Moderately tense across upper abdomen but soft in lower abdomen.   Rectal: Deferred Musc/Skeltl: No joint redness, swelling or significant deformity. Extremities: No CCE.  Feet are warm, well-perfused. Neurologic: Alert.  Oriented x3.  Moves all 4 limbs.  No tremors.  Limb strength not  tested.  No gross deficits. Skin: No rashes, no sores, no significant hematomas. Tattoos: None observed. Nodes: No cervical adenopathy. Psych: Slightly anxious, anxiety abated during conversation and with reassurance.  Fluid speech..  Intake/Output from previous day: 12/10 0701 - 12/11 0700 In: 3520.5 [P.O.:240; I.V.:2000; IV Piggyback:1280.5] Out: 492 [Urine:492] Intake/Output this shift: Total I/O In: 240 [P.O.:240] Out: 0   LAB RESULTS: Recent Labs    05/17/18 0112 05/18/18 0509 05/19/18 0557  WBC 17.8* 15.5* 16.4*  HGB 14.0 10.7* 9.8*    HCT 43.3 32.9* 31.5*  PLT 296 207 217   BMET Lab Results  Component Value Date   NA 135 05/19/2018   NA 138 05/18/2018   NA 136 05/17/2018   K 3.7 05/19/2018   K 3.5 05/18/2018   K 3.8 05/17/2018   CL 104 05/19/2018   CL 104 05/18/2018   CL 96 (L) 05/17/2018   CO2 21 (L) 05/19/2018   CO2 24 05/18/2018   CO2 30 05/17/2018   GLUCOSE 196 (H) 05/19/2018   GLUCOSE 209 (H) 05/18/2018   GLUCOSE 276 (H) 05/17/2018   BUN 18 05/19/2018   BUN 19 05/18/2018   BUN 24 (H) 05/17/2018   CREATININE 1.07 (H) 05/19/2018   CREATININE 1.04 (H) 05/18/2018   CREATININE 1.24 (H) 05/17/2018   CALCIUM 6.9 (L) 05/19/2018   CALCIUM 6.9 (L) 05/18/2018   CALCIUM 8.6 (L) 05/17/2018   LFT Recent Labs    05/17/18 0112  PROT 6.9  ALBUMIN 4.3  AST 24  ALT 30  ALKPHOS 81  BILITOT 0.5   PT/INR No results found for: INR, PROTIME Hepatitis Panel No results for input(s): HEPBSAG, HCVAB, HEPAIGM, HEPBIGM in the last 72 hours. C-Diff No components found for: CDIFF Lipase     Component Value Date/Time   LIPASE 54 (H) 05/17/2018 0112    Drugs of Abuse  No results found for: LABOPIA, COCAINSCRNUR, LABBENZ, AMPHETMU, THCU, LABBARB   RADIOLOGY STUDIES: Dg Abd 1 View  Result Date: 05/18/2018 CLINICAL DATA:  Abdominal pain, distention EXAM: ABDOMEN - 1 VIEW COMPARISON:  05/17/2018 FINDINGS: Mild gaseous distention of both large and small bowel loops. Moderate to large stool burden in the colon. No organomegaly or free air. IMPRESSION: Mild diffuse gaseous distention of both large and small bowel may reflect ileus. Moderate to large stool burden. Electronically Signed   By: Charlett Nose M.D.   On: 05/18/2018 10:23   Dg Abd Portable 1v  Result Date: 05/19/2018 CLINICAL DATA:  Constipation. Follow-up following to enemas yesterday. EXAM: PORTABLE ABDOMEN - 1 VIEW COMPARISON:  Upright view of the upper abdomen of May 18, 2018 FINDINGS: There remains considerable stool throughout the colon and  rectum. There's some large bowel gas. There's also gas within loops of minimally distended and normal caliber small bowel. No free extraluminal gas collections are observed. There's some gas in the stomach. No abnormal soft tissue calcifications are observed. There's curvature of the lumbar spine convex toward the left which is stable. IMPRESSION: Persistent large colonic and rectal stool burdens. Persistent small bowel gas within normal caliber and some mildly dilated small bowel loops likely secondary to the colonic stool burden and relative stasis. Electronically Signed   By: David  [place]and M.D.   On: 05/19/2018 11:16     IMPRESSION:   *    Left sided colitis.  Question infectious.  Although she does not have compromised mesenteric blood flow, she still could have acute ischemic colitis.  Nothing in her history suggests chronic colitis/IBD.  Previous colonoscopy, unable to locate any documentation. Day 3 empiric IV Cipro/Flagyl.  *    Mild elevation of lipase with normal LFTs.  Not clear this is of any significance  *   Normocytic anemia.  *    Mild renal insufficiency.   PLAN:     *   Dr. Myrtie Neither will be following the patient.  Await his plans.   Jennye Moccasin  05/19/2018, 2:35 PM Phone [phone]  I have reviewed the entire case in detail with the above APP and discussed the plan in detail.  Therefore, I agree with the diagnoses recorded above. In addition,  I have personally interviewed and examined the patient and have personally reviewed any abdominal/pelvic CT scan images.  My additional thoughts are as follows:  She had acute onset mid to lower abdominal pain starting last evening, found to have significant leukocytosis and a CT angiogram suggesting thickening of the splenic flexure and possibly descending colon.  She reports that her abdominal pain is decreased from what it was last evening, she is passing flatus but no stool today.  She also denies having any rectal bleeding  recently, including yesterday.  She does not believe she has constipation, and typically has a BM every day to every other day.  She has never had pain like this before, and believe she had a colonoscopy decades ago.  My exam, I find her abdomen to be distended and tympanitic with decreased bowel sounds.  She is not particularly tender however.  I am concerned that she has persistent leukocytosis and elevated venous lactate.  The lactate is slowly decreasing, but suggests there may be ischemic colitis.  On my personal review of the CT images, it also seems that the transverse and right colon are somewhat dilated as well.  I do not know if that is chronic from a partially obstructing cause in the left colon, or if there is a partial colonic ileus from this acute process.  We have ordered an upright KUB today to ensure that this has not evolved into a small bowel obstruction.  I suspect it will show dilated colon.  She is passing gas today, so does not seem to have a complete obstruction. The normocytic anemia is also of unclear cause and relation to symptoms.  Based on clinical findings and x-ray appearance, we will reevaluate in the morning and I believe she is likely to need a colonoscopy the following day.  Continue antibiotics in the meantime in case there is ischemic compromise to the intestine.  Charlie Pitter III Office:[phone]

## 2018-05-20 DIAGNOSIS — R140 Abdominal distension (gaseous): Secondary | ICD-10-CM

## 2018-05-20 LAB — CBC
HCT: 32.2 % — ABNORMAL LOW (ref 36.0–46.0)
Hemoglobin: 9.9 g/dL — ABNORMAL LOW (ref 12.0–15.0)
MCH: 28.7 pg (ref 26.0–34.0)
MCHC: 30.7 g/dL (ref 30.0–36.0)
MCV: 93.3 fL (ref 80.0–100.0)
PLATELETS: 237 10*3/uL (ref 150–400)
RBC: 3.45 MIL/uL — ABNORMAL LOW (ref 3.87–5.11)
RDW: 13.2 % (ref 11.5–15.5)
WBC: 11.7 10*3/uL — ABNORMAL HIGH (ref 4.0–10.5)
nRBC: 0 % (ref 0.0–0.2)

## 2018-05-20 LAB — BASIC METABOLIC PANEL
Anion gap: 7 (ref 5–15)
BUN: 11 mg/dL (ref 8–23)
CO2: 23 mmol/L (ref 22–32)
Calcium: 7.6 mg/dL — ABNORMAL LOW (ref 8.9–10.3)
Chloride: 107 mmol/L (ref 98–111)
Creatinine, Ser: 0.81 mg/dL (ref 0.44–1.00)
GFR calc Af Amer: >60 mL/min (ref >60)
GFR calc non Af Amer: >60 mL/min (ref >60)
Glucose, Bld: 247 mg/dL — ABNORMAL HIGH (ref 70–99)
Potassium: 4.1 mmol/L (ref 3.5–5.1)
Sodium: 137 mmol/L (ref 135–145)

## 2018-05-20 LAB — GLUCOSE, CAPILLARY
GLUCOSE-CAPILLARY: 168 mg/dL — AB (ref 70–99)
Glucose-Capillary: 184 mg/dL — ABNORMAL HIGH (ref 70–99)
Glucose-Capillary: 160 mg/dL — ABNORMAL HIGH (ref 70–99)
Glucose-Capillary: 173 mg/dL — ABNORMAL HIGH (ref 70–99)

## 2018-05-20 MED ORDER — PEG-KCL-NACL-NASULF-NA ASC-C 100 G PO SOLR: 1.0000 | Freq: Once | ORAL | Status: DC

## 2018-05-20 MED ORDER — PEG-KCL-NACL-NASULF-NA ASC-C 100 G PO SOLR
0.5000 | Freq: Once | ORAL | Status: AC
  Administered 2018-05-20: 100 g via ORAL
  Filled 2018-05-20: qty 1

## 2018-05-20 MED ORDER — BISACODYL 10 MG RE SUPP: 10.0000 mg | Freq: Once | RECTAL | Status: DC

## 2018-05-20 MED ORDER — PEG-KCL-NACL-NASULF-NA ASC-C 100 G PO SOLR
0.5000 | Freq: Once | ORAL | Status: AC
  Administered 2018-05-21: 100 g via ORAL
  Filled 2018-05-20: qty 1

## 2018-05-20 NOTE — Progress Notes (Signed)
Subjective/Chief Complaint: Reports much less abdominal pain Had BM last night   Objective: Vital signs in last 24 hours: Temp:  [97.7 F (36.5 C)-98.6 F (37 C)] 97.7 F (36.5 C) (12/12 0728) Pulse Rate:  [92-108] 92 (12/12 0728) Resp:  [18-22] 18 (12/12 0728) BP: (117-140)/(54-83) 140/69 (12/12 0728) SpO2:  [92 %-93 %] 92 % (12/12 0728) Last BM Date: 05/19/18  Intake/Output from previous day: 12/11 0701 - 12/12 0700 In: 2200 [P.O.:600; I.V.:1200; IV Piggyback:400] Out: 950 [Urine:950] Intake/Output this shift: No intake/output data recorded.  Exam: Awake and alert Still full in upper abdomen but overall softer and and almost non-tender  Lab Results:  Recent Labs    05/19/18 0557 05/20/18 0333  WBC 16.4* 11.7*  HGB 9.8* 9.9*  HCT 31.5* 32.2*  PLT 217 237   BMET Recent Labs    05/18/18 0509 05/19/18 0557  NA 138 135  K 3.5 3.7  CL 104 104  CO2 24 21*  GLUCOSE 209* 196*  BUN 19 18  CREATININE 1.04* 1.07*  CALCIUM 6.9* 6.9*   PT/INR No results for input(s): LABPROT, INR in the last 72 hours. ABG No results for input(s): PHART, HCO3 in the last 72 hours.  Invalid input(s): PCO2, PO2  Studies/Results: Dg Abd 1 View  Result Date: 05/19/2018 CLINICAL DATA:  Colitis. EXAM: ABDOMEN - 1 VIEW COMPARISON:  Abdominal radiograph May 19, 2018 at 1035 hours FINDINGS: Gas distended colon and to lesser extent small bowel is similar. No intraperitoneal free air. No intra-abdominal mass effect. Rectum is not included in field of view. Small suspected LEFT pleural effusion. Lumbar levoscoliosis. IMPRESSION: 1. Persistent gas distended colon and to lesser extent small bowel seen with distal bowel obstruction or ileus. No free air. Electronically Signed   By: Awilda Metro M.D.   On: 05/19/2018 17:30   Dg Abd 1 View  Result Date: 05/18/2018 CLINICAL DATA:  Abdominal pain, distention EXAM: ABDOMEN - 1 VIEW COMPARISON:  05/17/2018 FINDINGS: Mild gaseous  distention of both large and small bowel loops. Moderate to large stool burden in the colon. No organomegaly or free air. IMPRESSION: Mild diffuse gaseous distention of both large and small bowel may reflect ileus. Moderate to large stool burden. Electronically Signed   By: Charlett Nose M.D.   On: 05/18/2018 10:23   Dg Abd Portable 1v  Result Date: 05/19/2018 CLINICAL DATA:  Constipation. Follow-up following to enemas yesterday. EXAM: PORTABLE ABDOMEN - 1 VIEW COMPARISON:  Upright view of the upper abdomen of May 18, 2018 FINDINGS: There remains considerable stool throughout the colon and rectum. There's some large bowel gas. There's also gas within loops of minimally distended and normal caliber small bowel. No free extraluminal gas collections are observed. There's some gas in the stomach. No abnormal soft tissue calcifications are observed. There's curvature of the lumbar spine convex toward the left which is stable. IMPRESSION: Persistent large colonic and rectal stool burdens. Persistent small bowel gas within normal caliber and some mildly dilated small bowel loops likely secondary to the colonic stool burden and relative stasis. Electronically Signed   By: David  [place] M.D.   On: 05/19/2018 11:16    Anti-infectives: Anti-infectives (From admission, onward)   Start     Dose/Rate Route Frequency Ordered Stop   05/17/18 0600  ciprofloxacin (CIPRO) IVPB 400 mg     400 mg 200 mL/hr over 60 Minutes Intravenous Every 12 hours 05/17/18 0503     05/17/18 0500  cefTRIAXone (ROCEPHIN) 2 g in sodium  chloride 0.9 % 100 mL IVPB  Status:  Discontinued    Note to Pharmacy:  PCN allergy is GI upset--changing Levaquin to Rocephin 2 g IV as per consult   2 g 200 mL/hr over 30 Minutes Intravenous  Once 05/17/18 0452 05/17/18 0453   05/17/18 0445  levofloxacin (LEVAQUIN) IVPB 750 mg  Status:  Discontinued     750 mg 100 mL/hr over 90 Minutes Intravenous  Once 05/17/18 0434 05/17/18 0452   05/17/18 0445   metroNIDAZOLE (FLAGYL) IVPB 500 mg     500 mg 100 mL/hr over 60 Minutes Intravenous Every 8 hours 05/17/18 0434        Assessment/Plan:  Colitis of uncertain etiology  Improving Will give a suppository today to see if it will help Advance to full liquids  LOS: 2 days    Amanda King A 05/20/2018

## 2018-05-20 NOTE — Progress Notes (Addendum)
   [facility] Progress Note   Chief Complaint:    Generalized abdominal pain   SUBJECTIVE     Up bathing. No complaints. No blood in stool ( had BM today after receiving a suppository)   ASSESSMENT / PLAN:    1. 77 yo female admitted with acute abdominal pain, elevated lactic acid, leukocytosis and CTA of abd/pelvis suggesting colitis of splenic flexure and to some degree the descending colon. Infectious etiology possible but still cannot exclude ischemic insult (even though no blood in stool).  -WBC improving 16.4 >>> 11.7 -If pulmonary status allows, would like to proceed with a colonoscopy tomorrow. Will prep her today. The risks and benefits of colonoscopy with possible polypectomy were discussed and the patient agrees to proceed.   2. Wheezing, ? COPD ( former smoker), ? Asthma.  No acute cardiopulmonary findings on CXR 05/17/18  3. Mild AKI  4. [place] anemia. No overt GI blood loss. Hgb 12.8 in October, in 9-10 range this admit.   Vital signs in last 24 hours: Temp:  [97.7 F (36.5 C)-98.6 F (37 C)] 97.7 F (36.5 C) (12/12 0728) Pulse Rate:  [92-108] 92 (12/12 0728) Resp:  [18-22] 18 (12/12 0728) BP: (117-140)/(54-83) 140/69 (12/12 0728) SpO2:  [92 %-95 %] 95 % (12/12 0810) Last BM Date: 05/19/18 General:   Alert, well-developed female in NAD EENT:  Normal hearing, non icteric sclera, conjunctive pink.  Heart:  Regular rate and rhythm; no murmur.  No lower extremity edema   Pulm: Normal respiratory effort, Wheezing > left side.  Abdomen:  Soft, nondistended, nontender.  Normal bowel sounds, no masses felt.       Neurologic:  Alert and  oriented x4;  grossly normal neurologically. Psych:  Pleasant, cooperative.  Normal mood and affect.   Intake/Output from previous day: 12/11 0701 - 12/12 0700 In: 2200 [P.O.:600; I.V.:1200; IV Piggyback:400] Out: 950 [Urine:950] Intake/Output this shift: No intake/output data recorded.  Lab Results: Recent Labs      05/18/18 0509 05/19/18 0557 05/20/18 0333  WBC 15.5* 16.4* 11.7*  HGB 10.7* 9.8* 9.9*  HCT 32.9* 31.5* 32.2*  PLT 207 217 237   BMET Recent Labs    05/18/18 0509 05/19/18 0557  NA 138 135  K 3.5 3.7  CL 104 104  CO2 24 21*  GLUCOSE 209* 196*  BUN 19 18  CREATININE 1.04* 1.07*  CALCIUM 6.9* 6.9*   Dg Abd 1 View  Result Date: 05/19/2018 CLINICAL DATA:  Colitis. EXAM: ABDOMEN - 1 VIEW COMPARISON:  Abdominal radiograph May 19, 2018 at 1035 hours FINDINGS: Gas distended colon and to lesser extent small bowel is similar. No intraperitoneal free air. No intra-abdominal mass effect. Rectum is not included in field of view. Small suspected LEFT pleural effusion. Lumbar levoscoliosis. IMPRESSION: 1. Persistent gas distended colon and to lesser extent small bowel seen with distal bowel obstruction or ileus. No free air. Electronically Signed   By: Courtnay  Bloomer M.D.   On: 05/19/2018 17:30   Dg Abd Portable 1v  Result Date: 05/19/2018 CLINICAL DATA:  Constipation. Follow-up following to enemas yesterday. EXAM: PORTABLE ABDOMEN - 1 VIEW COMPARISON:  Upright view of the upper abdomen of May 18, 2018 FINDINGS: There remains considerable stool throughout the colon and rectum. There's some large bowel gas. There's also gas within loops of minimally distended and normal caliber small bowel. No free extraluminal gas collections are observed. There's some gas in the stomach. No abnormal soft tissue calcifications are observed. There's curvature   of the lumbar spine convex toward the left which is stable. IMPRESSION: Persistent large colonic and rectal stool burdens. Persistent small bowel gas within normal caliber and some mildly dilated small bowel loops likely secondary to the colonic stool burden and relative stasis. Electronically Signed   By: David  Jordan M.D.   On: 05/19/2018 11:16      Principal Problem:   Colitis presumed infectious Active Problems:   Diabetes type 2,  uncontrolled (HCC)   Hyperlipidemia LDL goal <100   Essential hypertension   Narcolepsy with cataplexy   Colitis     LOS: 2 days   Paula Guenther ,NP 05/20/2018, 11:01 AM   I have discussed the case with the PA, and that is the plan I formulated. I personally interviewed and examined the patient.  Personally reviewed the KUB image from 05/19/2018.  Her abdominal pain is almost completely resolved.  She is having bowel movements, especially after a suppository as ordered by the surgical attending earlier today.  She has not been vomiting, having hematemesis or rectal bleeding.  KUB from last evening shows dilated colon, similar to appearance on recent CT scan.  Her abdomen is somewhat less distended and tympanitic today.  However, she is mildly dyspneic and wheezing on exam, more so in the left lung.  My current plan is for a colonoscopy tomorrow if she can take adequate bowel preparation and if her respiratory status is improved with respiratory treatments and attention to volume overload if present. While she is a former smoker, she does not report a history of asthma or COPD and does not regularly use inhalers.  She is not known to have a history of CHF.  I would like to do the colonoscopy to rule out any ischemic colitis or structural lesion such as malignancy in the left colon given the overall presentation and appearance of CT scan at time of admission.  She is agreeable to colonoscopy after discussion of procedure and risks.  The benefits and risks of the planned procedure were described in detail with the patient or (when appropriate) their health care proxy.  Risks were outlined as including, but not limited to, bleeding, infection, perforation, adverse medication reaction leading to cardiac or pulmonary decompensation, or pancreatitis (if ERCP).  The limitation of incomplete mucosal visualization was also discussed.  No guarantees or warranties were given. Patient at increased  risk for cardiopulmonary complications of procedure due to medical comorbidities.   Risks and benefits were reviewed in the presence of our nurse practitioner, as we saw the patient together.  Total time, 35 minutes, over half spent counseling and coordinating care of patient. Luretta Everly L Danis III Office: [phone]     

## 2018-05-20 NOTE — H&P (View-Only) (Signed)
[facility] Progress Note   Chief Complaint:    Generalized abdominal pain   SUBJECTIVE     Up bathing. No complaints. No blood in stool ( had BM today after receiving a suppository)   ASSESSMENT / PLAN:    1. 77 yo female admitted with acute abdominal pain, elevated lactic acid, leukocytosis and CTA of abd/pelvis suggesting colitis of splenic flexure and to some degree the descending colon. Infectious etiology possible but still cannot exclude ischemic insult (even though no blood in stool).  -WBC improving 16.4 >>> 11.7 -If pulmonary status allows, would like to proceed with a colonoscopy tomorrow. Will prep her today. The risks and benefits of colonoscopy with possible polypectomy were discussed and the patient agrees to proceed.   2. Wheezing, ? COPD ( former smoker), ? Asthma.  No acute cardiopulmonary findings on CXR 05/17/18  3. Mild AKI  4. [place] anemia. No overt GI blood loss. Hgb 12.8 in October, in 9-10 range this admit.   Vital signs in last 24 hours: Temp:  [97.7 F (36.5 C)-98.6 F (37 C)] 97.7 F (36.5 C) (12/12 0728) Pulse Rate:  [92-108] 92 (12/12 0728) Resp:  [18-22] 18 (12/12 0728) BP: (117-140)/(54-83) 140/69 (12/12 0728) SpO2:  [92 %-95 %] 95 % (12/12 0810) Last BM Date: 05/19/18 General:   Alert, well-developed female in NAD EENT:  Normal hearing, non icteric sclera, conjunctive pink.  Heart:  Regular rate and rhythm; no murmur.  No lower extremity edema   Pulm: Normal respiratory effort, Wheezing > left side.  Abdomen:  Soft, nondistended, nontender.  Normal bowel sounds, no masses felt.       Neurologic:  Alert and  oriented x4;  grossly normal neurologically. Psych:  Pleasant, cooperative.  Normal mood and affect.   Intake/Output from previous day: 12/11 0701 - 12/12 0700 In: 2200 [P.O.:600; I.V.:1200; IV Piggyback:400] Out: 950 [Urine:950] Intake/Output this shift: No intake/output data recorded.  Lab Results: Recent Labs      05/18/18 0509 05/19/18 0557 05/20/18 0333  WBC 15.5* 16.4* 11.7*  HGB 10.7* 9.8* 9.9*  HCT 32.9* 31.5* 32.2*  PLT 207 217 237   BMET Recent Labs    05/18/18 0509 05/19/18 0557  NA 138 135  K 3.5 3.7  CL 104 104  CO2 24 21*  GLUCOSE 209* 196*  BUN 19 18  CREATININE 1.04* 1.07*  CALCIUM 6.9* 6.9*   Dg Abd 1 View  Result Date: 05/19/2018 CLINICAL DATA:  Colitis. EXAM: ABDOMEN - 1 VIEW COMPARISON:  Abdominal radiograph May 19, 2018 at 1035 hours FINDINGS: Gas distended colon and to lesser extent small bowel is similar. No intraperitoneal free air. No intra-abdominal mass effect. Rectum is not included in field of view. Small suspected LEFT pleural effusion. Lumbar levoscoliosis. IMPRESSION: 1. Persistent gas distended colon and to lesser extent small bowel seen with distal bowel obstruction or ileus. No free air. Electronically Signed   By: Awilda Metroourtnay  Bloomer M.D.   On: 05/19/2018 17:30   Dg Abd Portable 1v  Result Date: 05/19/2018 CLINICAL DATA:  Constipation. Follow-up following to enemas yesterday. EXAM: PORTABLE ABDOMEN - 1 VIEW COMPARISON:  Upright view of the upper abdomen of May 18, 2018 FINDINGS: There remains considerable stool throughout the colon and rectum. There's some large bowel gas. There's also gas within loops of minimally distended and normal caliber small bowel. No free extraluminal gas collections are observed. There's some gas in the stomach. No abnormal soft tissue calcifications are observed. There's curvature  of the lumbar spine convex toward the left which is stable. IMPRESSION: Persistent large colonic and rectal stool burdens. Persistent small bowel gas within normal caliber and some mildly dilated small bowel loops likely secondary to the colonic stool burden and relative stasis. Electronically Signed   By: David  [place] M.D.   On: 05/19/2018 11:16      Principal Problem:   Colitis presumed infectious Active Problems:   Diabetes type 2,  uncontrolled (HCC)   Hyperlipidemia LDL goal <100   Essential hypertension   Narcolepsy with cataplexy   Colitis     LOS: 2 days   Willette Cluster ,NP 05/20/2018, 11:01 AM   I have discussed the case with the PA, and that is the plan I formulated. I personally interviewed and examined the patient.  Personally reviewed the KUB image from 05/19/2018.  Her abdominal pain is almost completely resolved.  She is having bowel movements, especially after a suppository as ordered by the surgical attending earlier today.  She has not been vomiting, having hematemesis or rectal bleeding.  KUB from last evening shows dilated colon, similar to appearance on recent CT scan.  Her abdomen is somewhat less distended and tympanitic today.  However, she is mildly dyspneic and wheezing on exam, more so in the left lung.  My current plan is for a colonoscopy tomorrow if she can take adequate bowel preparation and if her respiratory status is improved with respiratory treatments and attention to volume overload if present. While she is a former smoker, she does not report a history of asthma or COPD and does not regularly use inhalers.  She is not known to have a history of CHF.  I would like to do the colonoscopy to rule out any ischemic colitis or structural lesion such as malignancy in the left colon given the overall presentation and appearance of CT scan at time of admission.  She is agreeable to colonoscopy after discussion of procedure and risks.  The benefits and risks of the planned procedure were described in detail with the patient or (when appropriate) their health care proxy.  Risks were outlined as including, but not limited to, bleeding, infection, perforation, adverse medication reaction leading to cardiac or pulmonary decompensation, or pancreatitis (if ERCP).  The limitation of incomplete mucosal visualization was also discussed.  No guarantees or warranties were given. Patient at increased  risk for cardiopulmonary complications of procedure due to medical comorbidities.   Risks and benefits were reviewed in the presence of our nurse practitioner, as we saw the patient together.  Total time, 35 minutes, over half spent counseling and coordinating care of patient. Charlie Pitter III Office: [phone]

## 2018-05-20 NOTE — Progress Notes (Signed)
PROGRESS NOTE    [name]   DGU:[id number]  DOB: [birth year]  DOA: 05/17/2018 PCP: Abner Greenspan, MD   Brief Narrative:  Amanda King is a 77 y.o. female with medical history significant of narcolepsy with cataplexy; migraine; hypothyroidism; HTN; HLD; and DM presenting with abdominal pain and nausea. No vomiting.  In ED > pain 7/10, stomach distended, tympanic CT abd/pelvis w/o contrast in ED >  1. Moderate stool and air in the colon. No bowel obstruction or active inflammation. Normal appendix. 2. Fatty liver. 3. Cholelithiasis. 4. Right renal hypodense lesions as described. Further evaluation with MRI or ultrasound on a nonemergent basis recommended.  CTA abd pelvis> notable new findings No CT evidence of mesenteric ischemia.-  Findings most consistent with colitis involving the splenic flexure and to a lesser degree the descending colon. Sigmoid diverticulosis without active inflammatory changes.   Subjective: Nausea not very severe today. No other complaints.  Assessment & Plan:   Principal Problem:  Colitis with ileus  Leukocytosis - large amount of stool on imaging  - Ischemic vs infectious- cont antibiotics for now- - I have ordered serial Dulcolax suppositories for abdominal distension and severe constipation- improving   GI assisting with management and would like to take for colonoscopy tomorrow- she will receive a prep today  Active Problems: Dehydration- urinary retention - foley was required (likely retaining due to constipation and GI issues) - poor oral intake recent and thus poor urine output- given fluid bolus and cont IVF  - will give voiding trial once above issues resolve - hold Imipramine, HCTZ for now  CKD 3 - stable  Lactic acidosis  - resolved    Diabetes type 2, uncontrolled  - SSI- hold Glipizide, Januvia and Metformin - Her last A1c was 8.0 on 10/1     Essential hypertension - HCTZ & Lisinopril on hold- BP stable    Narcolepsy  with cataplexy - cont Imipramine and Ritalin      DVT prophylaxis: Lovenox Code Status: DNR Family Communication: daughter  Disposition Plan: cont to follow on med/surg Consultants:   gen surgery Procedures:   none Antimicrobials:  Anti-infectives (From admission, onward)   Start     Dose/Rate Route Frequency Ordered Stop   05/17/18 0600  ciprofloxacin (CIPRO) IVPB 400 mg     400 mg 200 mL/hr over 60 Minutes Intravenous Every 12 hours 05/17/18 0503     05/17/18 0500  cefTRIAXone (ROCEPHIN) 2 g in sodium chloride 0.9 % 100 mL IVPB  Status:  Discontinued    Note to Pharmacy:  PCN allergy is GI upset--changing Levaquin to Rocephin 2 g IV as per consult   2 g 200 mL/hr over 30 Minutes Intravenous  Once 05/17/18 0452 05/17/18 0453   05/17/18 0445  levofloxacin (LEVAQUIN) IVPB 750 mg  Status:  Discontinued     750 mg 100 mL/hr over 90 Minutes Intravenous  Once 05/17/18 0434 05/17/18 0452   05/17/18 0445  metroNIDAZOLE (FLAGYL) IVPB 500 mg     500 mg 100 mL/hr over 60 Minutes Intravenous Every 8 hours 05/17/18 0434         Objective: Vitals:   05/19/18 2135 05/20/18 0532 05/20/18 0728 05/20/18 0810  BP: (!) 117/54 119/83 140/69   Pulse: (!) 103 97 92   Resp: (!) 22 (!) 22 18   Temp: 98.6 F (37 C) 97.7 F (36.5 C) 97.7 F (36.5 C)   TempSrc: Oral Oral Oral   SpO2: 93% 93% 92% 95%  Weight:      Height:        Intake/Output Summary (Last 24 hours) at 05/20/2018 1159 Last data filed at 05/20/2018 1100 Gross per 24 hour  Intake 2380 ml  Output 1400 ml  Net 980 ml   Filed Weights   05/17/18 1627 05/18/18 2108  Weight: 73 kg 73 kg    Examination: General exam: Appears comfortable  HEENT: PERRLA, oral mucosa moist, no sclera icterus or thrush Respiratory system: Clear to auscultation. Respiratory effort normal. Cardiovascular system: S1 & S2 heard, RRR.   Gastrointestinal system: Abdomen soft, non-tender, moderately distended, normal Bowel sounds Central  nervous system: Alert and oriented. No focal neurological deficits. Extremities: No cyanosis, clubbing or edema Skin: No rashes or ulcers Psychiatry:  Mood & affect appropriate.     Data Reviewed: I have personally reviewed following labs and imaging studies  CBC: Recent Labs  Lab 05/17/18 0112 05/18/18 0509 05/19/18 0557 05/20/18 0333  WBC 17.8* 15.5* 16.4* 11.7*  HGB 14.0 10.7* 9.8* 9.9*  HCT 43.3 32.9* 31.5* 32.2*  MCV 91.7 91.6 94.0 93.3  PLT 296 207 217 161   Basic Metabolic Panel: Recent Labs  Lab 05/17/18 0112 05/18/18 0509 05/19/18 0557  NA 136 138 135  K 3.8 3.5 3.7  CL 96* 104 104  CO2 30 24 21*  GLUCOSE 276* 209* 196*  BUN 24* 19 18  CREATININE 1.24* 1.04* 1.07*  CALCIUM 8.6* 6.9* 6.9*  MG  --   --  1.7   GFR: Estimated Creatinine Clearance: 41 mL/min (A) (by C-G formula based on SCr of 1.07 mg/dL (H)). Liver Function Tests: Recent Labs  Lab 05/17/18 0112  AST 24  ALT 30  ALKPHOS 81  BILITOT 0.5  PROT 6.9  ALBUMIN 4.3   Recent Labs  Lab 05/17/18 0112  LIPASE 54*   No results for input(s): AMMONIA in the last 168 hours. Coagulation Profile: No results for input(s): INR, PROTIME in the last 168 hours. Cardiac Enzymes: No results for input(s): CKTOTAL, CKMB, CKMBINDEX, TROPONINI in the last 168 hours. BNP (last 3 results) No results for input(s): PROBNP in the last 8760 hours. HbA1C: No results for input(s): HGBA1C in the last 72 hours. CBG: Recent Labs  Lab 05/19/18 1134 05/19/18 1626 05/19/18 2140 05/20/18 0729 05/20/18 1122  GLUCAP 166* 209* 189* 168* 184*   Lipid Profile: No results for input(s): CHOL, HDL, LDLCALC, TRIG, CHOLHDL, LDLDIRECT in the last 72 hours. Thyroid Function Tests: No results for input(s): TSH, T4TOTAL, FREET4, T3FREE, THYROIDAB in the last 72 hours. Anemia Panel: No results for input(s): VITAMINB12, FOLATE, FERRITIN, TIBC, IRON, RETICCTPCT in the last 72 hours. Urine analysis:    Component Value  Date/Time   COLORURINE YELLOW 05/17/2018 0350   APPEARANCEUR CLEAR 05/17/2018 0350   LABSPEC 1.025 05/17/2018 0350   PHURINE 5.0 05/17/2018 0350   GLUCOSEU 150 (A) 05/17/2018 0350   HGBUR NEGATIVE 05/17/2018 0350   BILIRUBINUR NEGATIVE 05/17/2018 0350   BILIRUBINUR small 11/24/2011 1030   KETONESUR NEGATIVE 05/17/2018 0350   PROTEINUR NEGATIVE 05/17/2018 0350   UROBILINOGEN 0.2 11/24/2011 1030   NITRITE NEGATIVE 05/17/2018 0350   LEUKOCYTESUR TRACE (A) 05/17/2018 0350   Sepsis Labs: '@LABRCNTIP'$ (procalcitonin:4,lacticidven:4) ) Recent Results (from the past 240 hour(s))  Blood Culture (routine x 2)     Status: None (Preliminary result)   Collection Time: 05/17/18  4:46 AM  Result Value Ref Range Status   Specimen Description BLOOD RIGHT ANTECUBITAL  Final   Special Requests   Final  BOTTLES DRAWN AEROBIC AND ANAEROBIC Blood Culture results may not be optimal due to an inadequate volume of blood received in culture bottles   Culture   Final    NO GROWTH 2 DAYS Performed at [facility], [place] [address], [place], [place] [address]    Report Status PENDING  Incomplete  Blood Culture (routine x 2)     Status: None (Preliminary result)   Collection Time: 05/17/18  4:47 AM  Result Value Ref Range Status   Specimen Description BLOOD RIGHT UPPER ARM  Final   Special Requests   Final    BOTTLES DRAWN AEROBIC AND ANAEROBIC Blood Culture results may not be optimal due to an inadequate volume of blood received in culture bottles   Culture   Final    NO GROWTH 2 DAYS Performed at [facility], [place] [address], [place], [place] [address]    Report Status PENDING  Incomplete         Radiology Studies: Dg Abd 1 View  Result Date: 05/19/2018 CLINICAL DATA:  Colitis. EXAM: ABDOMEN - 1 VIEW COMPARISON:  Abdominal radiograph May 19, 2018 at 1035 hours FINDINGS: Gas distended colon and to lesser extent small bowel is similar. No intraperitoneal free air. No  intra-abdominal mass effect. Rectum is not included in field of view. Small suspected LEFT pleural effusion. Lumbar levoscoliosis. IMPRESSION: 1. Persistent gas distended colon and to lesser extent small bowel seen with distal bowel obstruction or ileus. No free air. Electronically Signed   By: Elon Alas M.D.   On: 05/19/2018 17:30   Dg Abd Portable 1v  Result Date: 05/19/2018 CLINICAL DATA:  Constipation. Follow-up following to enemas yesterday. EXAM: PORTABLE ABDOMEN - 1 VIEW COMPARISON:  Upright view of the upper abdomen of May 18, 2018 FINDINGS: There remains considerable stool throughout the colon and rectum. There's some large bowel gas. There's also gas within loops of minimally distended and normal caliber small bowel. No free extraluminal gas collections are observed. There's some gas in the stomach. No abnormal soft tissue calcifications are observed. There's curvature of the lumbar spine convex toward the left which is stable. IMPRESSION: Persistent large colonic and rectal stool burdens. Persistent small bowel gas within normal caliber and some mildly dilated small bowel loops likely secondary to the colonic stool burden and relative stasis. Electronically Signed   By: David  Martinique M.D.   On: 05/19/2018 11:16      Scheduled Meds: . bisacodyl  10 mg Rectal Once  . enoxaparin (LOVENOX) injection  40 mg Subcutaneous Q24H  . feeding supplement  1 Container Oral TID BM  . imipramine  50 mg Oral QHS  . insulin aspart  0-9 Units Subcutaneous TID WC  . levothyroxine  88 mcg Oral Q0600  . lisinopril  20 mg Oral Daily  . mouth rinse  15 mL Mouth Rinse BID  . methylphenidate  20 mg Oral TID WC  . peg 3350 powder  0.5 kit Oral Once   And  . [START ON 05/21/2018] peg 3350 powder  0.5 kit Oral Once  . polyethylene glycol  17 g Oral Daily   Continuous Infusions: . 0.9 % NaCl with KCl 20 mEq / L 100 mL/hr at 05/19/18 1609  . ciprofloxacin 400 mg (05/20/18 6568)  . metronidazole  500 mg (05/20/18 0628)     LOS: 2 days    Time spent in minutes: 35    Debbe Odea, MD [facility] Pager: [email or web address] Password [facility] 05/20/2018, 11:59  AM  

## 2018-05-21 ENCOUNTER — Encounter (HOSPITAL_COMMUNITY): Payer: Self-pay | Admitting: Certified Registered Nurse Anesthetist

## 2018-05-21 ENCOUNTER — Encounter (HOSPITAL_COMMUNITY)
Admission: EM | Disposition: A | Discharge status: Home Health | Payer: Self-pay | Source: Home / Self Care | Attending: Internal Medicine

## 2018-05-21 LAB — BASIC METABOLIC PANEL
Anion gap: 13 (ref 5–15)
BUN: 9 mg/dL (ref 8–23)
CO2: 22 mmol/L (ref 22–32)
CREATININE: 0.72 mg/dL (ref 0.44–1.00)
Calcium: 7.8 mg/dL — ABNORMAL LOW (ref 8.9–10.3)
Chloride: 104 mmol/L (ref 98–111)
GFR calc non Af Amer: >60 mL/min (ref >60)
Glucose, Bld: 148 mg/dL — ABNORMAL HIGH (ref 70–99)
Potassium: 3.7 mmol/L (ref 3.5–5.1)
Sodium: 139 mmol/L (ref 135–145)

## 2018-05-21 LAB — GLUCOSE, CAPILLARY
GLUCOSE-CAPILLARY: 143 mg/dL — AB (ref 70–99)
Glucose-Capillary: 146 mg/dL — ABNORMAL HIGH (ref 70–99)
Glucose-Capillary: 197 mg/dL — ABNORMAL HIGH (ref 70–99)
Glucose-Capillary: 148 mg/dL — ABNORMAL HIGH (ref 70–99)

## 2018-05-21 SURGERY — CANCELLED PROCEDURE

## 2018-05-21 MED ORDER — ALBUTEROL SULFATE (2.5 MG/3ML) 0.083% IN NEBU
INHALATION_SOLUTION | RESPIRATORY_TRACT | Status: AC
  Filled 2018-05-21: qty 3

## 2018-05-21 MED ORDER — LACTATED RINGERS IV SOLN
INTRAVENOUS | Status: DC
  Administered 2018-05-21: 1000 mL via INTRAVENOUS

## 2018-05-21 MED ORDER — ALBUTEROL SULFATE (2.5 MG/3ML) 0.083% IN NEBU
2.5000 mg | INHALATION_SOLUTION | Freq: Once | RESPIRATORY_TRACT | Status: AC
  Administered 2018-05-21: 2.5 mg via RESPIRATORY_TRACT

## 2018-05-21 NOTE — Progress Notes (Signed)
Came by to see patient prior to colonoscopy. Consumed all of the prep but according to nursing tech patient's stool was still brown though liquid. Will give two tap water enemas now and postpone colonoscopy until later this morning.

## 2018-05-21 NOTE — Progress Notes (Signed)
Pt given nebulizer per verbal order Dr. Myrtie Neitheranis for wheezing.  Anesthesia in to assess patient.  Concerned regarding patient respiratory status.  Procedure cancelled per anesthesia and GI MD

## 2018-05-21 NOTE — Final Consult Note (Signed)
Consultant Final Sign-Off Note    Assessment/Final recommendations  Amanda King is a 77 y.o. female followed by me for  Abdominal pain likely 2/2 colitis involving the splenic flexure and proximal descending colon - CTA did not show any indication of ischemia - she does not have an acute abdomen and there is no indication for surgical intervention at this time - AXR showed likely ileus - GI to do colonoscpy today - pt had multiple BM's with bowel prep, no N or V and no abdominal pain. Tolerated FLD   Wound care (if applicable):    Diet at discharge: per primary team   Activity at discharge: per primary team   Follow-up appointment:  None needed   Pending results:  Unresulted Labs (From admission, onward)   None       Medication recommendations:   Other recommendations:    Thank you for allowing us to participate in the care of your patient!  Please consult us again if you have further needs for your patient.  Joyce CopaJessica L Ulus Hazen 05/21/2018 9:10 AM    Subjective   CC: SBO  Pt had multiple BM's after bowel prep. No abdominal pain, nausea or vomiting. She feels like her belly is less swollen.   Objective  Vital signs in last 24 hours: Temp:  [98.2 F (36.8 C)-98.5 F (36.9 C)] 98.3 F (36.8 C) (12/13 0722) Pulse Rate:  [88-101] 88 (12/13 0722) Resp:  [18-22] 20 (12/13 0722) BP: (124-158)/(75-82) 156/82 (12/13 0722) SpO2:  [90 %-96 %] 96 % (12/13 0722)  PE: Gen: Alert, NAD, pleasant, cooperative Pulm:Rate andeffort normal ZOX:WRUE.[id number]:Soft.moderatedistention. Normal BS,No TTP. No peritonitis. There isno rigidityand no guarding Skin: no rashes noted, warm and dry   Pertinent labs and Studies: Recent Labs    05/19/18 0557 05/20/18 0333  WBC 16.4* 11.7*  HGB 9.8* 9.9*  HCT 31.5* 32.2*   BMET Recent Labs    05/20/18 1321 05/21/18 0502  NA 137 139  K 4.1 3.7  CL 107 104  CO2 23 22  GLUCOSE 247* 148*  BUN 11 9  CREATININE 0.81 0.72  CALCIUM  7.6* 7.8*   No results for input(s): LABURIN in the last 72 hours. Results for orders placed or performed during the hospital encounter of 05/17/18  Blood Culture (routine x 2)     Status: None (Preliminary result)   Collection Time: 05/17/18  4:46 AM  Result Value Ref Range Status   Specimen Description BLOOD RIGHT ANTECUBITAL  Final   Special Requests   Final    BOTTLES DRAWN AEROBIC AND ANAEROBIC Blood Culture results may not be optimal due to an inadequate volume of blood received in culture bottles   Culture   Final    NO GROWTH 4 DAYS Performed at [facility], [address] [place], [place] [address]    Report Status PENDING  Incomplete  Blood Culture (routine x 2)     Status: None (Preliminary result)   Collection Time: 05/17/18  4:47 AM  Result Value Ref Range Status   Specimen Description BLOOD RIGHT UPPER ARM  Final   Special Requests   Final    BOTTLES DRAWN AEROBIC AND ANAEROBIC Blood Culture results may not be optimal due to an inadequate volume of blood received in culture bottles   Culture   Final    NO GROWTH 4 DAYS Performed at [facility], [address] [place], [place] [address]    Report Status PENDING  Incomplete  Imaging: No results found.

## 2018-05-21 NOTE — Care Management Important Message (Signed)
Important Message  Patient Details  Name: Jeanell SparrowBrenda C Rota MRN: [id number] Date of Birth: 24-May-1941   Medicare Important Message Given:  Yes    Allen Egerton 05/21/2018, 4:00 PM

## 2018-05-21 NOTE — Progress Notes (Signed)
PROGRESS NOTE    Amanda SparrowBrenda C Keeran   ZOX:[id number]  DOB: 07-06-1940  DOA: 05/17/2018 PCP: Judy Pimpleower, Marne A, MD   Brief Narrative:  Amanda King is a 77 y.o. female with medical history significant of narcolepsy with cataplexy; migraine; hypothyroidism; HTN; HLD; and DM presenting with abdominal pain and nausea. No vomiting.  In ED > pain 7/10, stomach distended, tympanic CT abd/pelvis w/o contrast in ED >  1. Moderate stool and air in the colon. No bowel obstruction or active inflammation. Normal appendix. 2. Fatty liver. 3. Cholelithiasis. 4. Right renal hypodense lesions as described. Further evaluation with MRI or ultrasound on a nonemergent basis recommended.  CTA abd pelvis> notable new findings No CT evidence of mesenteric ischemia.-  Findings most consistent with colitis involving the splenic flexure and to a lesser degree the descending colon. Sigmoid diverticulosis without active inflammatory changes.   Subjective: Very mild nausea. Very sleepy. No other complaints.   Assessment & Plan:   Principal Problem:  Colitis with ileus  Leukocytosis - large amount of stool on imaging may have contributed to illeus picture- this is improved with laxatives  - Ischemic vs infectious- cont antibiotics for now-she will have colonoscopy performed today   Active Problems: Dehydration- urinary retention - foley was required (likely retaining due to constipation and GI issues) - poor oral intake recent and thus poor urine output- given fluid bolus and cont IVF  - will give voiding trial once above issues resolve - hold Imipramine, HCTZ for now   Lactic acidosis  - resolved- ? Due to ischemic colitis     Diabetes type 2, uncontrolled  - SSI- hold Glipizide, Januvia and Metformin - Her last A1c was 8.0 on 10/1     Essential hypertension - HCTZ & Lisinopril on hold- BP stable    Narcolepsy with cataplexy - cont Imipramine and Ritalin   DVT prophylaxis: Lovenox Code Status:  DNR Family Communication: daughter  Disposition Plan: cont to follow on med/surg Consultants:   gen surgery Procedures:   none Antimicrobials:  Anti-infectives (From admission, onward)   Start     Dose/Rate Route Frequency Ordered Stop   05/17/18 0600  ciprofloxacin (CIPRO) IVPB 400 mg     400 mg 200 mL/hr over 60 Minutes Intravenous Every 12 hours 05/17/18 0503     05/17/18 0500  cefTRIAXone (ROCEPHIN) 2 g in sodium chloride 0.9 % 100 mL IVPB  Status:  Discontinued    Note to Pharmacy:  PCN allergy is GI upset--changing Levaquin to Rocephin 2 g IV as per consult   2 g 200 mL/hr over 30 Minutes Intravenous  Once 05/17/18 0452 05/17/18 0453   05/17/18 0445  levofloxacin (LEVAQUIN) IVPB 750 mg  Status:  Discontinued     750 mg 100 mL/hr over 90 Minutes Intravenous  Once 05/17/18 0434 05/17/18 0452   05/17/18 0445  metroNIDAZOLE (FLAGYL) IVPB 500 mg     500 mg 100 mL/hr over 60 Minutes Intravenous Every 8 hours 05/17/18 0434         Objective: Vitals:   05/20/18 1700 05/20/18 2223 05/21/18 0525 05/21/18 0722  BP: 124/75 (!) 147/75 (!) 158/76 (!) 156/82  Pulse: 92 (!) 101 89 88  Resp: 18  (!) 22 20  Temp: 98.2 F (36.8 C)  98.5 F (36.9 C) 98.3 F (36.8 C)  TempSrc: Oral   Oral  SpO2: 90% 93% 96% 96%  Weight:      Height:        Intake/Output Summary (  Last 24 hours) at 05/21/2018 1149 Last data filed at 05/21/2018 0700 Gross per 24 hour  Intake 3000 ml  Output 1575 ml  Net 1425 ml   Filed Weights   05/17/18 1627 05/18/18 2108  Weight: 73 kg 73 kg    Examination: General exam: Appears comfortable  HEENT: PERRLA, oral mucosa moist, no sclera icterus or thrush Respiratory system: Clear to auscultation. No wheezing or rhonchi Cardiovascular system: S1 & S2 heard, RRR.   Gastrointestinal system: Abdomen soft, non-tender, quite distended again today, normal Bowel sounds Central nervous system: Alert and oriented. No focal neurological deficits. Extremities: No  cyanosis, clubbing or edema Skin: No rashes or ulcers Psychiatry:  Mood & affect appropriate.     Data Reviewed: I have personally reviewed following labs and imaging studies  CBC: Recent Labs  Lab 05/17/18 0112 05/18/18 0509 05/19/18 0557 05/20/18 0333  WBC 17.8* 15.5* 16.4* 11.7*  HGB 14.0 10.7* 9.8* 9.9*  HCT 43.3 32.9* 31.5* 32.2*  MCV 91.7 91.6 94.0 93.3  PLT 296 207 217 237   Basic Metabolic Panel: Recent Labs  Lab 05/17/18 0112 05/18/18 0509 05/19/18 0557 05/20/18 1321 05/21/18 0502  NA 136 138 135 137 139  K 3.8 3.5 3.7 4.1 3.7  CL 96* 104 104 107 104  CO2 30 24 21* 23 22  GLUCOSE 276* 209* 196* 247* 148*  BUN 24* 19 18 11 9   CREATININE 1.24* 1.04* 1.07* 0.81 0.72  CALCIUM 8.6* 6.9* 6.9* 7.6* 7.8*  MG  --   --  1.7  --   --    GFR: Estimated Creatinine Clearance: 54.9 mL/min (by C-G formula based on SCr of 0.72 mg/dL). Liver Function Tests: Recent Labs  Lab 05/17/18 0112  AST 24  ALT 30  ALKPHOS 81  BILITOT 0.5  PROT 6.9  ALBUMIN 4.3   Recent Labs  Lab 05/17/18 0112  LIPASE 54*   No results for input(s): AMMONIA in the last 168 hours. Coagulation Profile: No results for input(s): INR, PROTIME in the last 168 hours. Cardiac Enzymes: No results for input(s): CKTOTAL, CKMB, CKMBINDEX, TROPONINI in the last 168 hours. BNP (last 3 results) No results for input(s): PROBNP in the last 8760 hours. HbA1C: No results for input(s): HGBA1C in the last 72 hours. CBG: Recent Labs  Lab 05/20/18 1122 05/20/18 1701 05/20/18 2116 05/21/18 0722 05/21/18 1106  GLUCAP 184* 160* 173* 146* 143*   Lipid Profile: No results for input(s): CHOL, HDL, LDLCALC, TRIG, CHOLHDL, LDLDIRECT in the last 72 hours. Thyroid Function Tests: No results for input(s): TSH, T4TOTAL, FREET4, T3FREE, THYROIDAB in the last 72 hours. Anemia Panel: No results for input(s): VITAMINB12, FOLATE, FERRITIN, TIBC, IRON, RETICCTPCT in the last 72 hours. Urine analysis:      Component Value Date/Time   COLORURINE YELLOW 05/17/2018 0350   APPEARANCEUR CLEAR 05/17/2018 0350   LABSPEC 1.025 05/17/2018 0350   PHURINE 5.0 05/17/2018 0350   GLUCOSEU 150 (A) 05/17/2018 0350   HGBUR NEGATIVE 05/17/2018 0350   BILIRUBINUR NEGATIVE 05/17/2018 0350   BILIRUBINUR small 11/24/2011 1030   KETONESUR NEGATIVE 05/17/2018 0350   PROTEINUR NEGATIVE 05/17/2018 0350   UROBILINOGEN 0.2 11/24/2011 1030   NITRITE NEGATIVE 05/17/2018 0350   LEUKOCYTESUR TRACE (A) 05/17/2018 0350   Sepsis Labs: @LABRCNTIP (procalcitonin:4,lacticidven:4) ) Recent Results (from the past 240 hour(s))  Blood Culture (routine x 2)     Status: None (Preliminary result)   Collection Time: 05/17/18  4:46 AM  Result Value Ref Range Status   Specimen Description  BLOOD RIGHT ANTECUBITAL  Final   Special Requests   Final    BOTTLES DRAWN AEROBIC AND ANAEROBIC Blood Culture results may not be optimal due to an inadequate volume of blood received in culture bottles   Culture   Final    NO GROWTH 4 DAYS Performed at [facility], [address], [place], [place] [address]    Report Status PENDING  Incomplete  Blood Culture (routine x 2)     Status: None (Preliminary result)   Collection Time: 05/17/18  4:47 AM  Result Value Ref Range Status   Specimen Description BLOOD RIGHT UPPER ARM  Final   Special Requests   Final    BOTTLES DRAWN AEROBIC AND ANAEROBIC Blood Culture results may not be optimal due to an inadequate volume of blood received in culture bottles   Culture   Final    NO GROWTH 4 DAYS Performed at [facility], [address], [place], [place] [address]    Report Status PENDING  Incomplete         Radiology Studies: Dg Abd 1 View  Result Date: 05/19/2018 CLINICAL DATA:  Colitis. EXAM: ABDOMEN - 1 VIEW COMPARISON:  Abdominal radiograph May 19, 2018 at 1035 hours FINDINGS: Gas distended colon and to lesser extent small bowel is similar. No intraperitoneal free air.  No intra-abdominal mass effect. Rectum is not included in field of view. Small suspected LEFT pleural effusion. Lumbar levoscoliosis. IMPRESSION: 1. Persistent gas distended colon and to lesser extent small bowel seen with distal bowel obstruction or ileus. No free air. Electronically Signed   By: Awilda Metro M.D.   On: 05/19/2018 17:30      Scheduled Meds: . bisacodyl  10 mg Rectal Once  . enoxaparin (LOVENOX) injection  40 mg Subcutaneous Q24H  . feeding supplement  1 Container Oral TID BM  . imipramine  50 mg Oral QHS  . insulin aspart  0-9 Units Subcutaneous TID WC  . levothyroxine  88 mcg Oral Q0600  . lisinopril  20 mg Oral Daily  . mouth rinse  15 mL Mouth Rinse BID  . methylphenidate  20 mg Oral TID WC  . polyethylene glycol  17 g Oral Daily   Continuous Infusions: . 0.9 % NaCl with KCl 20 mEq / L 100 mL/hr at 05/20/18 2250  . ciprofloxacin 400 mg (05/20/18 1902)  . metronidazole 500 mg (05/21/18 0400)     LOS: 3 days    Time spent in minutes: 35    Calvert Cantor, MD [facility] Pager: [email or web address] Password [facility] 05/21/2018, 11:49 AM

## 2018-05-21 NOTE — Anesthesia Preprocedure Evaluation (Deleted)
Anesthesia Evaluation    Airway        Dental   Pulmonary former smoker,           Cardiovascular hypertension,      Neuro/Psych    GI/Hepatic   Endo/Other  diabetes  Renal/GU      Musculoskeletal   Abdominal   Peds  Hematology   Anesthesia Other Findings   Reproductive/Obstetrics                            Anesthesia Physical Anesthesia Plan  ASA: III  Anesthesia Plan: MAC   Post-op Pain Management:    Induction:   PONV Risk Score and Plan: 2 and Propofol infusion, TIVA and Treatment may vary due to age or medical condition  Airway Management Planned: Nasal Cannula and Simple Face Mask  Additional Equipment: None  Intra-op Plan:   Post-operative Plan:   Informed Consent:   Plan Discussed with:   Anesthesia Plan Comments: (CASE CANCELLED DUE TO PATIENT AUDIBLY WHEEZING WITH LOW O2 SAT AND NON-URGENT PROCEDURE. NO HX OF ASTHMA, COPD OR CHF.)       Anesthesia Quick Evaluation

## 2018-05-21 NOTE — Progress Notes (Signed)
[facility] Progress Note  Chief Complaint: Generalized abdominal pain  History:  Amanda King's abdominal pain has resolved since I saw her yesterday morning.  She took bowel preparation overnight and early this morning.  Although did not run completely clear after the prep, the output was running more clear after enemas this morning.  She still feels dyspneic and is wheezing in the endoscopy preprocedure area.  ROS: Cardiovascular:  no chest pain No fever  Objective:  Med list reviewed  Vital signs in last 24 hrs: Vitals:   05/21/18 0722 05/21/18 1219  BP: (!) 156/82 (!) 181/71  Pulse: 88 91  Resp: 20 19  Temp: 98.3 F (36.8 C) 97.9 F (36.6 C)  SpO2: 96% 97%    Physical Exam   HEENT: sclera anicteric, oral mucosa moist without lesions  Neck: supple, no thyromegaly, JVD or lymphadenopathy  Cardiac: RRR without murmurs, S1S2 heard, no peripheral edema  Pulm: She has expiratory wheezing bilaterally which persisted after an albuterol treatment in the endoscopy lab.  Respiratory rate 20 with prolonged expiratory phase.  She is in no respiratory distress and is not using accessory muscles to breathe.  She was also seen by the anesthesia physician.  Abdomen: soft mildly distended, but improved from before.  No tenderness, with active bowel sounds. No guarding or palpable hepatosplenomegaly  Skin; warm and dry, no jaundice or rash  Recent Labs:  Recent Labs  Lab 05/18/18 0509 05/19/18 0557 05/20/18 0333  WBC 15.5* 16.4* 11.7*  HGB 10.7* 9.8* 9.9*  HCT 32.9* 31.5* 32.2*  PLT 207 217 237   Recent Labs  Lab 05/17/18 0112  05/21/18 0502  NA 136   < > 139  K 3.8   < > 3.7  CL 96*   < > 104  CO2 30   < > 22  BUN 24*   < > 9  ALBUMIN 4.3  --   --   ALKPHOS 81  --   --   ALT 30  --   --   AST 24  --   --   GLUCOSE 276*   < > 148*   < > = values in this interval not displayed.   No results for input(s): INR in the last 168 hours.  Radiologic studies:  None new  since yesterday  @ASSESSMENTPLANBEGIN @ Assessment: Generalized abdominal pain, now resolved after relief of constipation. Abnormal imaging GI tract, admission CT angiogram suggesting wall thickening near the splenic flexure.  She still has active airway issues that might be upper rather than lower, especially since the wheezing persisted after albuterol treatment.  I spoke with her hospitalist, had seen her earlier today and plans to check on her again later in the day. The anesthesia attending and I agree that we should not proceed with this elective procedure at this point since the respiratory risks of sedation are increased.  Perhaps the patient has had a recent upper respiratory that is playing a role here.  Plan: I have discontinued antibiotics Diabetic diet ordered Continue once daily MiraLAX to maintain regularity Please call us when this patient's upper airway issue has resolved and you feel she is ready for colonoscopy.  It would also be reasonable to defer that to the outpatient setting if there is prolonged recovery from this respiratory issue.   Charlie PitterHenry L Danis III Office: [phone]

## 2018-05-21 NOTE — Progress Notes (Addendum)
[facility]  Spoke with Dr Myrtie Neitheranis on the phone as he notified me that coloscopy was canceled due to wheezing. I mentioned that she has has some mild upper airway congestion on and off during the hospital stay but has never been hypoxic. Pulse ox was 97% on room air at 12 PM.  No h/o COPD or asthma. I evaluated her when she came up from colonoscopy. There are no rhonchi or wheezes on exam. No cough.  When asked about plan, Dr Myrtie Neitheranis states that she may have had some ischemic colitis related to constipation. He recommends stopping antibiotics. I will monitor overnight off of antibiotics. She had significant urinary retention on admission thought to be due to her GI issues. Will d/c foley today as her issues appear improved.   Calvert CantorSaima Jaylaa Gallion, MD

## 2018-05-21 NOTE — Progress Notes (Signed)
PT Cancellation Note  Patient Details Name: Amanda SparrowBrenda C Cocking MRN: [id number] DOB: 03-Sep-1940   Cancelled Treatment:    Reason Eval/Treat Not Completed: Medical issues which prohibited therapy.  Nursing asked PT to hold the visit as pt is preparing for colonoscopy and will not be able to get up for a walk.  Try again another time.   Ivar DrapeRuth E Lyfe Monger 05/21/2018, 5:34 PM   Samul Dadauth Alisia Vanengen, PT MS Acute Rehab Dept. Number: [facility] [phone] and [facility] [phone]

## 2018-05-21 NOTE — Interval H&P Note (Signed)
History and Physical Interval Note:  05/21/2018 12:30 PM  Amanda SparrowBrenda C Rayl  has presented today for surgery, with the diagnosis of abnormal CT scan  The various methods of treatment have been discussed with the patient and family. After consideration of risks, benefits and other options for treatment, the patient has consented to  Procedure(s): COLONOSCOPY (N/A) as a surgical intervention .  The patient's history has been reviewed, patient examined, no change in status, stable for surgery.  I have reviewed the patient's chart and labs.  Questions were answered to the patient's satisfaction.    She is still wheezing , similar to yesterday morning's exam.  Not in respiratory distress. Albuterol nebulizer will be given before proceeding with colonoscopy.  Charlie PitterHenry L Danis III

## 2018-05-22 ENCOUNTER — Inpatient Hospital Stay (HOSPITAL_COMMUNITY): Payer: Medicare Other

## 2018-05-22 LAB — BASIC METABOLIC PANEL
Anion gap: 8 (ref 5–15)
BUN: 7 mg/dL — AB (ref 8–23)
CALCIUM: 7.9 mg/dL — AB (ref 8.9–10.3)
CO2: 28 mmol/L (ref 22–32)
Chloride: 101 mmol/L (ref 98–111)
Creatinine, Ser: 0.67 mg/dL (ref 0.44–1.00)
GFR calc Af Amer: >60 mL/min (ref >60)
GFR calc non Af Amer: >60 mL/min (ref >60)
Glucose, Bld: 195 mg/dL — ABNORMAL HIGH (ref 70–99)
Potassium: 3.5 mmol/L (ref 3.5–5.1)
SODIUM: 137 mmol/L (ref 135–145)

## 2018-05-22 LAB — CULTURE, BLOOD (ROUTINE X 2)
Culture: NO GROWTH
Culture: NO GROWTH

## 2018-05-22 LAB — CBC
HCT: 32.6 % — ABNORMAL LOW (ref 36.0–46.0)
Hemoglobin: 10.7 g/dL — ABNORMAL LOW (ref 12.0–15.0)
MCH: 29.9 pg (ref 26.0–34.0)
MCHC: 32.8 g/dL (ref 30.0–36.0)
MCV: 91.1 fL (ref 80.0–100.0)
Platelets: 282 10*3/uL (ref 150–400)
RBC: 3.58 MIL/uL — ABNORMAL LOW (ref 3.87–5.11)
RDW: 13.1 % (ref 11.5–15.5)
WBC: 9 10*3/uL (ref 4.0–10.5)
nRBC: 0.4 % — ABNORMAL HIGH (ref 0.0–0.2)

## 2018-05-22 LAB — GLUCOSE, CAPILLARY
Glucose-Capillary: 154 mg/dL — ABNORMAL HIGH (ref 70–99)
Glucose-Capillary: 156 mg/dL — ABNORMAL HIGH (ref 70–99)
Glucose-Capillary: 205 mg/dL — ABNORMAL HIGH (ref 70–99)
Glucose-Capillary: 183 mg/dL — ABNORMAL HIGH (ref 70–99)

## 2018-05-22 MED ORDER — FUROSEMIDE 10 MG/ML IJ SOLN
20.0000 mg | Freq: Once | INTRAMUSCULAR | Status: AC
  Administered 2018-05-22: 20 mg via INTRAVENOUS
  Filled 2018-05-22: qty 2

## 2018-05-22 MED ORDER — GUAIFENESIN ER 600 MG PO TB12
600.0000 mg | ORAL_TABLET | Freq: Two times a day (BID) | ORAL | Status: DC
  Administered 2018-05-22 – 2018-05-24 (×5): 600 mg via ORAL
  Filled 2018-05-22 (×5): qty 1

## 2018-05-22 MED ORDER — HYDROCHLOROTHIAZIDE 25 MG PO TABS
25.0000 mg | ORAL_TABLET | Freq: Every day | ORAL | Status: DC
  Administered 2018-05-22 – 2018-05-24 (×3): 25 mg via ORAL
  Filled 2018-05-22 (×3): qty 1

## 2018-05-22 NOTE — Progress Notes (Signed)
Patient ambulated on RA, O2 sat was 91% and above. Will continue to monitor.   Lillia PaulsLaura B. RN

## 2018-05-22 NOTE — Progress Notes (Addendum)
PROGRESS NOTE    Amanda King   ZOX:[id number]  DOB: 04-Jan-1941  DOA: 05/17/2018 PCP: Judy Pimple, MD   Brief Narrative:  Amanda King is a 77 y.o. female with medical history significant of narcolepsy with cataplexy; migraine; hypothyroidism; HTN; HLD; and DM presenting with abdominal pain and nausea. No vomiting.  In ED > pain 7/10, stomach distended, tympanic CT abd/pelvis w/o contrast in ED >  1. Moderate stool and air in the colon. No bowel obstruction or active inflammation. Normal appendix. 2. Fatty liver. 3. Cholelithiasis. 4. Right renal hypodense lesions as described. Further evaluation with MRI or ultrasound on a nonemergent basis recommended.  CTA abd pelvis> notable new findings No CT evidence of mesenteric ischemia.-  Findings most consistent with colitis involving the splenic flexure and to a lesser degree the descending colon. Sigmoid diverticulosis without active inflammatory changes.   Subjective: Eating breakfast this AM. No abdominal pain. Abdomen is distended again but having BMs. Mild cough on and off but no dyspnea.   Assessment & Plan:   Principal Problem:  Colitis with ileus  Leukocytosis - large amount of stool on imaging may have contributed to illeus picture- this is improved with laxatives  - Ischemic vs infectious- GI recommended to d/c antibiotics- went for colonoscopy yesterday but has some upper air way congestion and thus she could not be sedated - colonoscopy cancelled - although abdomen is distended, she has a non-obstructive bowel gas pattern on imaging - d/w Dr Myrtie Neither and Willette Cluster today   Active Problems: Dehydration- urinary retention - foley was required (likely retaining due to constipation and GI issues) -  she had poor urine output- given fluid bolus and continuous IVF  -  voiding trial given and now voiding well  Lactic acidosis  - resolved- ? Due to ischemic colitis in addition to dehydration    Diabetes type 2,  uncontrolled  - SSI- sugars are quite stable -  hold Glipizide, Januvia and Metformin - Her last A1c was 8.0 on 10/1    Essential hypertension - HCTZ  resumed today- she may be mildly fluid overloaded today due to the IVF she has been receiving- will give a small dose of Lasix- IVF held yesterday    Narcolepsy with cataplexy - cont Imipramine and Ritalin   DVT prophylaxis: Lovenox Code Status: DNR Family Communication: daughter  Disposition Plan: cont to follow on med/surg Consultants:   gen surgery Procedures:   none Antimicrobials:  Anti-infectives (From admission, onward)   Start     Dose/Rate Route Frequency Ordered Stop   05/17/18 0600  ciprofloxacin (CIPRO) IVPB 400 mg  Status:  Discontinued     400 mg 200 mL/hr over 60 Minutes Intravenous Every 12 hours 05/17/18 0503 05/21/18 1423   05/17/18 0500  cefTRIAXone (ROCEPHIN) 2 g in sodium chloride 0.9 % 100 mL IVPB  Status:  Discontinued    Note to Pharmacy:  PCN allergy is GI upset--changing Levaquin to Rocephin 2 g IV as per consult   2 g 200 mL/hr over 30 Minutes Intravenous  Once 05/17/18 0452 05/17/18 0453   05/17/18 0445  levofloxacin (LEVAQUIN) IVPB 750 mg  Status:  Discontinued     750 mg 100 mL/hr over 90 Minutes Intravenous  Once 05/17/18 0434 05/17/18 0452   05/17/18 0445  metroNIDAZOLE (FLAGYL) IVPB 500 mg  Status:  Discontinued     500 mg 100 mL/hr over 60 Minutes Intravenous Every 8 hours 05/17/18 0434 05/21/18 1423  Objective: Vitals:   05/21/18 1219 05/21/18 2043 05/22/18 0450 05/22/18 0956  BP: (!) 181/71 (!) 163/83 (!) 164/68 (!) 152/74  Pulse: 91 91 94 89  Resp: 19 18 16 18   Temp: 97.9 F (36.6 C) 98.1 F (36.7 C) 98.1 F (36.7 C) (!) 97.5 F (36.4 C)  TempSrc: Oral Oral Oral Oral  SpO2: 97% 92% 92% 95%  Weight:  73.2 kg    Height:        Intake/Output Summary (Last 24 hours) at 05/22/2018 1238 Last data filed at 05/22/2018 1127 Gross per 24 hour  Intake 1176 ml  Output 1000 ml    Net 176 ml   Filed Weights   05/17/18 1627 05/18/18 2108 05/21/18 2043  Weight: 73 kg 73 kg 73.2 kg    Examination: General exam: Appears comfortable  HEENT: PERRLA, oral mucosa moist, no sclera icterus or thrush Respiratory system: Clear to auscultation. No wheezing or rhonchi Cardiovascular system: S1 & S2 heard, RRR.   Gastrointestinal system: Abdomen soft, non-tender, quite distended again today, normal Bowel sounds Central nervous system: Alert and oriented. No focal neurological deficits. Extremities: No cyanosis, clubbing or edema Skin: No rashes or ulcers Psychiatry:  Mood & affect appropriate.     Data Reviewed: I have personally reviewed following labs and imaging studies  CBC: Recent Labs  Lab 05/17/18 0112 05/18/18 0509 05/19/18 0557 05/20/18 0333 05/22/18 0820  WBC 17.8* 15.5* 16.4* 11.7* 9.0  HGB 14.0 10.7* 9.8* 9.9* 10.7*  HCT 43.3 32.9* 31.5* 32.2* 32.6*  MCV 91.7 91.6 94.0 93.3 91.1  PLT 296 207 217 237 282   Basic Metabolic Panel: Recent Labs  Lab 05/18/18 0509 05/19/18 0557 05/20/18 1321 05/21/18 0502 05/22/18 0820  NA 138 135 137 139 137  K 3.5 3.7 4.1 3.7 3.5  CL 104 104 107 104 101  CO2 24 21* 23 22 28   GLUCOSE 209* 196* 247* 148* 195*  BUN 19 18 11 9  7*  CREATININE 1.04* 1.07* 0.81 0.72 0.67  CALCIUM 6.9* 6.9* 7.6* 7.8* 7.9*  MG  --  1.7  --   --   --    GFR: Estimated Creatinine Clearance: 55 mL/min (by C-G formula based on SCr of 0.67 mg/dL). Liver Function Tests: Recent Labs  Lab 05/17/18 0112  AST 24  ALT 30  ALKPHOS 81  BILITOT 0.5  PROT 6.9  ALBUMIN 4.3   Recent Labs  Lab 05/17/18 0112  LIPASE 54*   No results for input(s): AMMONIA in the last 168 hours. Coagulation Profile: No results for input(s): INR, PROTIME in the last 168 hours. Cardiac Enzymes: No results for input(s): CKTOTAL, CKMB, CKMBINDEX, TROPONINI in the last 168 hours. BNP (last 3 results) No results for input(s): PROBNP in the last 8760  hours. HbA1C: No results for input(s): HGBA1C in the last 72 hours. CBG: Recent Labs  Lab 05/21/18 1106 05/21/18 1627 05/21/18 2045 05/22/18 0746 05/22/18 1135  GLUCAP 143* 197* 148* 154* 156*   Lipid Profile: No results for input(s): CHOL, HDL, LDLCALC, TRIG, CHOLHDL, LDLDIRECT in the last 72 hours. Thyroid Function Tests: No results for input(s): TSH, T4TOTAL, FREET4, T3FREE, THYROIDAB in the last 72 hours. Anemia Panel: No results for input(s): VITAMINB12, FOLATE, FERRITIN, TIBC, IRON, RETICCTPCT in the last 72 hours. Urine analysis:    Component Value Date/Time   COLORURINE YELLOW 05/17/2018 0350   APPEARANCEUR CLEAR 05/17/2018 0350   LABSPEC 1.025 05/17/2018 0350   PHURINE 5.0 05/17/2018 0350   GLUCOSEU 150 (A) 05/17/2018 0350  HGBUR NEGATIVE 05/17/2018 0350   BILIRUBINUR NEGATIVE 05/17/2018 0350   BILIRUBINUR small 11/24/2011 1030   KETONESUR NEGATIVE 05/17/2018 0350   PROTEINUR NEGATIVE 05/17/2018 0350   UROBILINOGEN 0.2 11/24/2011 1030   NITRITE NEGATIVE 05/17/2018 0350   LEUKOCYTESUR TRACE (A) 05/17/2018 0350   Sepsis Labs: @LABRCNTIP (procalcitonin:4,lacticidven:4) ) Recent Results (from the past 240 hour(s))  Blood Culture (routine x 2)     Status: None (Preliminary result)   Collection Time: 05/17/18  4:46 AM  Result Value Ref Range Status   Specimen Description BLOOD RIGHT ANTECUBITAL  Final   Special Requests   Final    BOTTLES DRAWN AEROBIC AND ANAEROBIC Blood Culture results may not be optimal due to an inadequate volume of blood received in culture bottles   Culture   Final    NO GROWTH 4 DAYS Performed at [facility], [address] [place], [place] [address]    Report Status PENDING  Incomplete  Blood Culture (routine x 2)     Status: None (Preliminary result)   Collection Time: 05/17/18  4:47 AM  Result Value Ref Range Status   Specimen Description BLOOD RIGHT UPPER ARM  Final   Special Requests   Final    BOTTLES DRAWN AEROBIC AND  ANAEROBIC Blood Culture results may not be optimal due to an inadequate volume of blood received in culture bottles   Culture   Final    NO GROWTH 4 DAYS Performed at [facility], [address], [place], [place] [address]    Report Status PENDING  Incomplete         Radiology Studies: Dg Abd 1 View  Result Date: 05/22/2018 CLINICAL DATA:  Abdominal distension. EXAM: ABDOMEN - 1 VIEW COMPARISON:  Radiograph of May 19, 2018. FINDINGS: The bowel gas pattern is normal. No radio-opaque calculi or other significant radiographic abnormality are seen. IMPRESSION: No evidence of bowel obstruction or ileus. Electronically Signed   By: Lupita Raider, M.D.   On: 05/22/2018 09:14      Scheduled Meds: . bisacodyl  10 mg Rectal Once  . enoxaparin (LOVENOX) injection  40 mg Subcutaneous Q24H  . feeding supplement  1 Container Oral TID BM  . guaiFENesin  600 mg Oral BID  . hydrochlorothiazide  25 mg Oral Daily  . imipramine  50 mg Oral QHS  . insulin aspart  0-9 Units Subcutaneous TID WC  . levothyroxine  88 mcg Oral Q0600  . lisinopril  20 mg Oral Daily  . mouth rinse  15 mL Mouth Rinse BID  . methylphenidate  20 mg Oral TID WC  . polyethylene glycol  17 g Oral Daily   Continuous Infusions:    LOS: 4 days    Time spent in minutes: 35    Calvert Cantor, MD [facility] Pager: [email or web address] Password [facility] 05/22/2018, 12:38 PM

## 2018-05-23 DIAGNOSIS — R1084 Generalized abdominal pain: Secondary | ICD-10-CM

## 2018-05-23 DIAGNOSIS — R933 Abnormal findings on diagnostic imaging of other parts of digestive tract: Secondary | ICD-10-CM

## 2018-05-23 DIAGNOSIS — K5901 Slow transit constipation: Secondary | ICD-10-CM

## 2018-05-23 LAB — GLUCOSE, CAPILLARY
Glucose-Capillary: 176 mg/dL — ABNORMAL HIGH (ref 70–99)
Glucose-Capillary: 193 mg/dL — ABNORMAL HIGH (ref 70–99)

## 2018-05-23 MED ORDER — PEG-KCL-NACL-NASULF-NA ASC-C 100 G PO SOLR: 1.0000 | Freq: Once | ORAL | Status: DC

## 2018-05-23 MED ORDER — PEG-KCL-NACL-NASULF-NA ASC-C 100 G PO SOLR
0.5000 | Freq: Once | ORAL | Status: AC
  Administered 2018-05-23: 100 g via ORAL
  Filled 2018-05-23: qty 1

## 2018-05-23 MED ORDER — PEG-KCL-NACL-NASULF-NA ASC-C 100 G PO SOLR
0.5000 | Freq: Once | ORAL | Status: AC
  Administered 2018-05-24: 100 g via ORAL

## 2018-05-23 NOTE — Progress Notes (Signed)
SATURATION QUALIFICATIONS: (This note is used to comply with regulatory documentation for home oxygen)  Patient Saturations on Room Air at Rest = 100%  Patient Saturations on Room Air while Ambulating = 98%    Please briefly explain why patient needs home oxygen: No need at this time

## 2018-05-23 NOTE — H&P (View-Only) (Signed)
[facility] Progress Note   Chief Complaint:  Abnormal colon on CT scan   SUBJECTIVE:    no abdominal pain. No diarrhea. Feels okay   ASSESSMENT AND PLAN:   77 yo female admitted with acute abdominal pain, elevated lactic acid, leukocytosis and CTA of abd/pelvis suggesting colitis of splenic flexure and to some degree the descending colon. Infectious etiology possible but still cannot exclude ischemic insult (even though no blood in stool) -colonoscopy this admission was cancelled due to wheezing of undetermined etiology ( no COPD, asthma or hx of heart failure. Apparently the wheezing has been intermittent. 02 sats have been normal, in fact 100% on room air today.   -We can prep again for am colonoscopy. Patient a little discouraged about length of hospital stay and the need to reprep but she would like to proceed.     OBJECTIVE:     Vital signs in last 24 hours: Temp:  [97.8 F (36.6 C)-98.3 F (36.8 C)] 98.1 F (36.7 C) (12/15 0908) Pulse Rate:  [84-89] 89 (12/15 0908) Resp:  [19-20] 20 (12/15 0908) BP: (150-168)/(56-76) 150/62 (12/15 0908) SpO2:  [93 %-97 %] 97 % (12/15 0908) Weight:  [73.2 kg] 73.2 kg (12/14 2051) Last BM Date: 05/23/18 General:   Alert,  female in NAD EENT:  Normal hearing, non icteric sclera, conjunctive pink.  Heart:  Regular rate and rhythm; no murmur.  No lower extremity edema   Pulm: Normal respiratory effort, lungs CTA bilaterally. No wheezing at present. Abdomen:  Soft, protuberant, nonender.  Normal bowel sounds, no masses felt.       Neurologic:  Alert and  oriented x4;  grossly normal neurologically. Psych:  Pleasant, cooperative.  Normal mood and affect.  Review of systems: Breathing is back to normal, no more wheezing/stridor. Abdominal pain is gone, distention resolved Denies chest pain or dyspnea.  Has been able to walk the hall with a normal oxygen saturation.  Intake/Output from previous day: 12/14 0701 - 12/15  0700 In: 720 [P.O.:720] Out: 1300 [Urine:1300] Intake/Output this shift: Total I/O In: 600 [P.O.:600] Out: 0   Lab Results: Recent Labs    05/22/18 0820  WBC 9.0  HGB 10.7*  HCT 32.6*  PLT 282   BMET Recent Labs    05/20/18 1321 05/21/18 0502 05/22/18 0820  NA 137 139 137  K 4.1 3.7 3.5  CL 107 104 101  CO2 23 22 28   GLUCOSE 247* 148* 195*  BUN 11 9 7*  CREATININE 0.81 0.72 0.67  CALCIUM 7.6* 7.8* 7.9*     Dg Abd 1 View  Result Date: 05/22/2018 CLINICAL DATA:  Abdominal distension. EXAM: ABDOMEN - 1 VIEW COMPARISON:  Radiograph of May 19, 2018. FINDINGS: The bowel gas pattern is normal. No radio-opaque calculi or other significant radiographic abnormality are seen. IMPRESSION: No evidence of bowel obstruction or ileus. Electronically Signed   By: Lupita Raider, M.D.   On: 05/22/2018 09:14     Principal Problem:   Colitis presumed infectious Active Problems:   Diabetes type 2, uncontrolled (HCC)   Hyperlipidemia LDL goal <100   Essential hypertension   Narcolepsy with cataplexy   Colitis     LOS: 5 days   Willette Cluster ,NP 05/23/2018, 11:22 AM   I have discussed the case with the PA, and that is the plan I formulated. I personally interviewed and examined the patient.  Abdominal pain and distention resolved, exam benign.  She is breathing comfortably with no easing  or stridor on exam.  I spoke with her and her daughter, and Steward DroneBrenda is agreeable to another attempt at colonoscopy tomorrow.  It will be performed by my partner, Dr. Meridee ScoreMansouraty, which I expect she will be okay for discharge to follow-up with me as needed. Take MiraLAX as needed to avoid constipation at home.    Charlie PitterHenry L Danis III Office: [phone]

## 2018-05-23 NOTE — Progress Notes (Signed)
PROGRESS NOTE    Amanda King   SFK:[id number]  DOB: [birth year]  DOA: 05/17/2018 PCP: Abner Greenspan, MD   Brief Narrative:  Amanda King is a 77 y.o. female with medical history significant of narcolepsy with cataplexy; migraine; hypothyroidism; HTN; HLD; and DM presenting with abdominal pain and nausea. No vomiting.  In ED > pain 7/10, stomach distended, tympanic CT abd/pelvis w/o contrast in ED >  1. Moderate stool and air in the colon. No bowel obstruction or active inflammation. Normal appendix. 2. Fatty liver. 3. Cholelithiasis. 4. Right renal hypodense lesions as described. Further evaluation with MRI or ultrasound on a nonemergent basis recommended.  CTA abd pelvis> notable new findings No CT evidence of mesenteric ischemia.-  Findings most consistent with colitis involving the splenic flexure and to a lesser degree the descending colon. Sigmoid diverticulosis without active inflammatory changes.   Subjective: Evaluated during breakfast. She is eating normally and has been having soft BM.   Assessment & Plan:   Principal Problem:  Colitis with ileus  Leukocytosis - large amount of stool on imaging may have contributed to illeus picture- this is improved with laxatives  -colitis on CT may be Ischemic vs infectious- GI recommended to d/c antibiotics- went for colonoscopy  but has some upper air way congestion and thus she could not be sedated - colonoscopy thus cancelled- GI plans to scope again tomorrow -  she has a non-obstructive bowel gas pattern on imaging now - d/w Dr Loletha Carrow today   Active Problems:  Lactic acidosis  - resolved- ? Due to ischemic colitis in addition to dehydration  Upper airway congestion - she has some mild upper airway congestion early on in the admission on and off during the hospital stay but has never been hypoxic.  -  No h/o COPD or asthma.  -I evaluated her when she came up from colonoscopy. She had no wheezing or rhonchi, no cough an-  I walked with her and checked pulse ox which was 98% - Ok per GI to scope tomorrow  Dehydration- urinary retention - foley was required (likely retaining due to constipation and GI issues) -  she had poor urine output- given fluid bolus and continuous IVF  -  voiding trial given and now voiding well    Diabetes type 2, uncontrolled  - SSI- sugars are quite stable -  hold Glipizide, Januvia and Metformin - Her last A1c was 8.0 on 10/1    Essential hypertension - cont HCTZ and Lisinopril    Narcolepsy with cataplexy - cont Imipramine and Ritalin  Hypothyroid - cont Synthroid   DVT prophylaxis: Lovenox Code Status: DNR Family Communication: daughter  Disposition Plan: cont to follow on med/surg Consultants:   gen surgery Procedures:   none Antimicrobials:  Anti-infectives (From admission, onward)   Start     Dose/Rate Route Frequency Ordered Stop   05/17/18 0600  ciprofloxacin (CIPRO) IVPB 400 mg  Status:  Discontinued     400 mg 200 mL/hr over 60 Minutes Intravenous Every 12 hours 05/17/18 0503 05/21/18 1423   05/17/18 0500  cefTRIAXone (ROCEPHIN) 2 g in sodium chloride 0.9 % 100 mL IVPB  Status:  Discontinued    Note to Pharmacy:  PCN allergy is GI upset--changing Levaquin to Rocephin 2 g IV as per consult   2 g 200 mL/hr over 30 Minutes Intravenous  Once 05/17/18 0452 05/17/18 0453   05/17/18 0445  levofloxacin (LEVAQUIN) IVPB 750 mg  Status:  Discontinued  750 mg 100 mL/hr over 90 Minutes Intravenous  Once 05/17/18 0434 05/17/18 0452   05/17/18 0445  metroNIDAZOLE (FLAGYL) IVPB 500 mg  Status:  Discontinued     500 mg 100 mL/hr over 60 Minutes Intravenous Every 8 hours 05/17/18 0434 05/21/18 1423       Objective: Vitals:   05/22/18 1614 05/22/18 2051 05/23/18 0532 05/23/18 0908  BP: (!) 150/56 (!) 167/76 (!) 168/73 (!) 150/62  Pulse: 84 84 87 89  Resp: '20 19 20 20  ' Temp: 98.1 F (36.7 C) 98.3 F (36.8 C) 97.8 F (36.6 C) 98.1 F (36.7 C)  TempSrc:  Oral Oral Oral Oral  SpO2: 95% 96% 93% 97%  Weight:  73.2 kg    Height:        Intake/Output Summary (Last 24 hours) at 05/23/2018 1411 Last data filed at 05/23/2018 1000 Gross per 24 hour  Intake 960 ml  Output 700 ml  Net 260 ml   Filed Weights   05/18/18 2108 05/21/18 2043 05/22/18 2051  Weight: 73 kg 73.2 kg 73.2 kg    Examination: General exam: Appears comfortable  HEENT: PERRLA, oral mucosa moist, no sclera icterus or thrush Respiratory system: Clear to auscultation. No wheezing or rhonchi- pulse ox 98% on room air Cardiovascular system: S1 & S2 heard, RRR.   Gastrointestinal system: Abdomen soft, non-tender,  abdomen soft today , normal Bowel sounds Central nervous system: Alert and oriented. No focal neurological deficits. Extremities: No cyanosis, clubbing or edema Skin: No rashes or ulcers Psychiatry:  Mood & affect appropriate.     Data Reviewed: I have personally reviewed following labs and imaging studies  CBC: Recent Labs  Lab 05/17/18 0112 05/18/18 0509 05/19/18 0557 05/20/18 0333 05/22/18 0820  WBC 17.8* 15.5* 16.4* 11.7* 9.0  HGB 14.0 10.7* 9.8* 9.9* 10.7*  HCT 43.3 32.9* 31.5* 32.2* 32.6*  MCV 91.7 91.6 94.0 93.3 91.1  PLT 296 207 217 237 832   Basic Metabolic Panel: Recent Labs  Lab 05/18/18 0509 05/19/18 0557 05/20/18 1321 05/21/18 0502 05/22/18 0820  NA 138 135 137 139 137  K 3.5 3.7 4.1 3.7 3.5  CL 104 104 107 104 101  CO2 24 21* '23 22 28  ' GLUCOSE 209* 196* 247* 148* 195*  BUN '19 18 11 9 ' 7*  CREATININE 1.04* 1.07* 0.81 0.72 0.67  CALCIUM 6.9* 6.9* 7.6* 7.8* 7.9*  MG  --  1.7  --   --   --    GFR: Estimated Creatinine Clearance: 55 mL/min (by C-G formula based on SCr of 0.67 mg/dL). Liver Function Tests: Recent Labs  Lab 05/17/18 0112  AST 24  ALT 30  ALKPHOS 81  BILITOT 0.5  PROT 6.9  ALBUMIN 4.3   Recent Labs  Lab 05/17/18 0112  LIPASE 54*   No results for input(s): AMMONIA in the last 168 hours. Coagulation  Profile: No results for input(s): INR, PROTIME in the last 168 hours. Cardiac Enzymes: No results for input(s): CKTOTAL, CKMB, CKMBINDEX, TROPONINI in the last 168 hours. BNP (last 3 results) No results for input(s): PROBNP in the last 8760 hours. HbA1C: No results for input(s): HGBA1C in the last 72 hours. CBG: Recent Labs  Lab 05/22/18 1135 05/22/18 1613 05/22/18 2052 05/23/18 0718 05/23/18 1132  GLUCAP 156* 205* 183* 176* 193*   Lipid Profile: No results for input(s): CHOL, HDL, LDLCALC, TRIG, CHOLHDL, LDLDIRECT in the last 72 hours. Thyroid Function Tests: No results for input(s): TSH, T4TOTAL, FREET4, T3FREE, THYROIDAB in the last  72 hours. Anemia Panel: No results for input(s): VITAMINB12, FOLATE, FERRITIN, TIBC, IRON, RETICCTPCT in the last 72 hours. Urine analysis:    Component Value Date/Time   COLORURINE YELLOW 05/17/2018 0350   APPEARANCEUR CLEAR 05/17/2018 0350   LABSPEC 1.025 05/17/2018 0350   PHURINE 5.0 05/17/2018 0350   GLUCOSEU 150 (A) 05/17/2018 0350   HGBUR NEGATIVE 05/17/2018 0350   BILIRUBINUR NEGATIVE 05/17/2018 0350   BILIRUBINUR small 11/24/2011 1030   KETONESUR NEGATIVE 05/17/2018 0350   PROTEINUR NEGATIVE 05/17/2018 0350   UROBILINOGEN 0.2 11/24/2011 1030   NITRITE NEGATIVE 05/17/2018 0350   LEUKOCYTESUR TRACE (A) 05/17/2018 0350   Sepsis Labs: '@LABRCNTIP' (procalcitonin:4,lacticidven:4) ) Recent Results (from the past 240 hour(s))  Blood Culture (routine x 2)     Status: None   Collection Time: 05/17/18  4:46 AM  Result Value Ref Range Status   Specimen Description BLOOD RIGHT ANTECUBITAL  Final   Special Requests   Final    BOTTLES DRAWN AEROBIC AND ANAEROBIC Blood Culture results may not be optimal due to an inadequate volume of blood received in culture bottles   Culture   Final    NO GROWTH 5 DAYS Performed at [facility], [place] [address] [place], [place] [address]    Report Status 05/22/2018 FINAL  Final  Blood Culture  (routine x 2)     Status: None   Collection Time: 05/17/18  4:47 AM  Result Value Ref Range Status   Specimen Description BLOOD RIGHT UPPER ARM  Final   Special Requests   Final    BOTTLES DRAWN AEROBIC AND ANAEROBIC Blood Culture results may not be optimal due to an inadequate volume of blood received in culture bottles   Culture   Final    NO GROWTH 5 DAYS Performed at [facility], [place] [address], [place], [place] [address]    Report Status 05/22/2018 FINAL  Final         Radiology Studies: Dg Abd 1 View  Result Date: 05/22/2018 CLINICAL DATA:  Abdominal distension. EXAM: ABDOMEN - 1 VIEW COMPARISON:  Radiograph of May 19, 2018. FINDINGS: The bowel gas pattern is normal. No radio-opaque calculi or other significant radiographic abnormality are seen. IMPRESSION: No evidence of bowel obstruction or ileus. Electronically Signed   By: Marijo Conception, M.D.   On: 05/22/2018 09:14      Scheduled Meds: . bisacodyl  10 mg Rectal Once  . enoxaparin (LOVENOX) injection  40 mg Subcutaneous Q24H  . feeding supplement  1 Container Oral TID BM  . guaiFENesin  600 mg Oral BID  . hydrochlorothiazide  25 mg Oral Daily  . imipramine  50 mg Oral QHS  . insulin aspart  0-9 Units Subcutaneous TID WC  . levothyroxine  88 mcg Oral Q0600  . lisinopril  20 mg Oral Daily  . mouth rinse  15 mL Mouth Rinse BID  . methylphenidate  20 mg Oral TID WC  . peg 3350 powder  0.5 kit Oral Once   And  . [START ON 05/24/2018] peg 3350 powder  0.5 kit Oral Once  . polyethylene glycol  17 g Oral Daily   Continuous Infusions:    LOS: 5 days    Time spent in minutes: 35    Debbe Odea, MD [facility] Pager: [email or web address] Password [email or web address] 05/23/2018, 2:11 PM

## 2018-05-23 NOTE — Consult Note (Addendum)
[facility] Progress Note   Chief Complaint:  Abnormal colon on CT scan   SUBJECTIVE:    no abdominal pain. No diarrhea. Feels okay   ASSESSMENT AND PLAN:   77 yo female admitted with acute abdominal pain, elevated lactic acid, leukocytosis and CTA of abd/pelvis suggesting colitis of splenic flexure and to some degree the descending colon. Infectious etiology possible but still cannot exclude ischemic insult (even though no blood in stool) -colonoscopy this admission was cancelled due to wheezing of undetermined etiology ( no COPD, asthma or hx of heart failure. Apparently the wheezing has been intermittent. 02 sats have been normal, in fact 100% on room air today.   -We can prep again for am colonoscopy. Patient a little discouraged about length of hospital stay and the need to reprep but she would like to proceed.     OBJECTIVE:     Vital signs in last 24 hours: Temp:  [97.8 F (36.6 C)-98.3 F (36.8 C)] 98.1 F (36.7 C) (12/15 0908) Pulse Rate:  [84-89] 89 (12/15 0908) Resp:  [19-20] 20 (12/15 0908) BP: (150-168)/(56-76) 150/62 (12/15 0908) SpO2:  [93 %-97 %] 97 % (12/15 0908) Weight:  [73.2 kg] 73.2 kg (12/14 2051) Last BM Date: 05/23/18 General:   Alert,  female in NAD EENT:  Normal hearing, non icteric sclera, conjunctive pink.  Heart:  Regular rate and rhythm; no murmur.  No lower extremity edema   Pulm: Normal respiratory effort, lungs CTA bilaterally. No wheezing at present. Abdomen:  Soft, protuberant, nonender.  Normal bowel sounds, no masses felt.       Neurologic:  Alert and  oriented x4;  grossly normal neurologically. Psych:  Pleasant, cooperative.  Normal mood and affect.  Review of systems: Breathing is back to normal, no more wheezing/stridor. Abdominal pain is gone, distention resolved Denies chest pain or dyspnea.  Has been able to walk the hall with a normal oxygen saturation.  Intake/Output from previous day: 12/14 0701 - 12/15  0700 In: 720 [P.O.:720] Out: 1300 [Urine:1300] Intake/Output this shift: Total I/O In: 600 [P.O.:600] Out: 0   Lab Results: Recent Labs    05/22/18 0820  WBC 9.0  HGB 10.7*  HCT 32.6*  PLT 282   BMET Recent Labs    05/20/18 1321 05/21/18 0502 05/22/18 0820  NA 137 139 137  K 4.1 3.7 3.5  CL 107 104 101  CO2 23 22 28   GLUCOSE 247* 148* 195*  BUN 11 9 7*  CREATININE 0.81 0.72 0.67  CALCIUM 7.6* 7.8* 7.9*     Dg Abd 1 View  Result Date: 05/22/2018 CLINICAL DATA:  Abdominal distension. EXAM: ABDOMEN - 1 VIEW COMPARISON:  Radiograph of May 19, 2018. FINDINGS: The bowel gas pattern is normal. No radio-opaque calculi or other significant radiographic abnormality are seen. IMPRESSION: No evidence of bowel obstruction or ileus. Electronically Signed   By: Lupita Raider, M.D.   On: 05/22/2018 09:14     Principal Problem:   Colitis presumed infectious Active Problems:   Diabetes type 2, uncontrolled (HCC)   Hyperlipidemia LDL goal <100   Essential hypertension   Narcolepsy with cataplexy   Colitis     LOS: 5 days   Willette Cluster ,NP 05/23/2018, 11:22 AM   I have discussed the case with the PA, and that is the plan I formulated. I personally interviewed and examined the patient.  Abdominal pain and distention resolved, exam benign.  She is breathing comfortably with no easing  or stridor on exam.  I spoke with her and her daughter, and Steward DroneBrenda is agreeable to another attempt at colonoscopy tomorrow.  It will be performed by my partner, Dr. Meridee ScoreMansouraty, which I expect she will be okay for discharge to follow-up with me as needed. Take MiraLAX as needed to avoid constipation at home.    Charlie PitterHenry L Danis III Office: [phone]

## 2018-05-23 NOTE — Progress Notes (Signed)
Patient refuses bed alarm, explained the alarm is for her safety and patient is agreeable to call for help to the bathroom

## 2018-05-24 ENCOUNTER — Encounter (HOSPITAL_COMMUNITY)
Admission: EM | Disposition: A | Discharge status: Home Health | Payer: Self-pay | Source: Home / Self Care | Attending: Internal Medicine

## 2018-05-24 ENCOUNTER — Encounter (HOSPITAL_COMMUNITY): Payer: Self-pay | Admitting: Gastroenterology

## 2018-05-24 ENCOUNTER — Inpatient Hospital Stay (HOSPITAL_COMMUNITY): Payer: Medicare Other | Admitting: Certified Registered"

## 2018-05-24 DIAGNOSIS — K5730 Diverticulosis of large intestine without perforation or abscess without bleeding: Secondary | ICD-10-CM

## 2018-05-24 DIAGNOSIS — Z8719 Personal history of other diseases of the digestive system: Secondary | ICD-10-CM

## 2018-05-24 DIAGNOSIS — K641 Second degree hemorrhoids: Secondary | ICD-10-CM

## 2018-05-24 DIAGNOSIS — K644 Residual hemorrhoidal skin tags: Secondary | ICD-10-CM

## 2018-05-24 DIAGNOSIS — K567 Ileus, unspecified: Secondary | ICD-10-CM

## 2018-05-24 DIAGNOSIS — K559 Vascular disorder of intestine, unspecified: Principal | ICD-10-CM

## 2018-05-24 DIAGNOSIS — K5900 Constipation, unspecified: Secondary | ICD-10-CM

## 2018-05-24 HISTORY — PX: BIOPSY: SHX5522

## 2018-05-24 HISTORY — PX: COLONOSCOPY: SHX5424

## 2018-05-24 LAB — BASIC METABOLIC PANEL
Anion gap: 13 (ref 5–15)
BUN: 6 mg/dL — ABNORMAL LOW (ref 8–23)
CO2: 27 mmol/L (ref 22–32)
Calcium: 8.6 mg/dL — ABNORMAL LOW (ref 8.9–10.3)
Chloride: 100 mmol/L (ref 98–111)
Creatinine, Ser: 0.89 mg/dL (ref 0.44–1.00)
GFR calc Af Amer: >60 mL/min (ref >60)
GFR calc non Af Amer: >60 mL/min (ref >60)
Glucose, Bld: 180 mg/dL — ABNORMAL HIGH (ref 70–99)
POTASSIUM: 3.4 mmol/L — AB (ref 3.5–5.1)
Sodium: 140 mmol/L (ref 135–145)

## 2018-05-24 LAB — GLUCOSE, CAPILLARY
Glucose-Capillary: 194 mg/dL — ABNORMAL HIGH (ref 70–99)
Glucose-Capillary: 169 mg/dL — ABNORMAL HIGH (ref 70–99)
Glucose-Capillary: 158 mg/dL — ABNORMAL HIGH (ref 70–99)
Glucose-Capillary: 181 mg/dL — ABNORMAL HIGH (ref 70–99)

## 2018-05-24 SURGERY — COLONOSCOPY
Anesthesia: Monitor Anesthesia Care

## 2018-05-24 MED ORDER — PROPOFOL 500 MG/50ML IV EMUL
INTRAVENOUS | Status: DC | PRN
  Administered 2018-05-24: 50 ug/kg/min via INTRAVENOUS

## 2018-05-24 MED ORDER — PHENYLEPHRINE HCL 10 MG/ML IJ SOLN
INTRAMUSCULAR | Status: DC | PRN
  Administered 2018-05-24: 80 ug via INTRAVENOUS

## 2018-05-24 MED ORDER — METRONIDAZOLE 500 MG PO TABS: 500.0000 mg | ORAL_TABLET | Freq: Three times a day (TID) | ORAL | 0 refills | Status: DC

## 2018-05-24 MED ORDER — CIPROFLOXACIN HCL 500 MG PO TABS: 500.0000 mg | ORAL_TABLET | Freq: Two times a day (BID) | ORAL | Status: DC

## 2018-05-24 MED ORDER — CIPROFLOXACIN HCL 500 MG PO TABS: 500.0000 mg | ORAL_TABLET | Freq: Two times a day (BID) | ORAL | 0 refills | Status: DC

## 2018-05-24 MED ORDER — CIPROFLOXACIN IN D5W 400 MG/200ML IV SOLN
400.0000 mg | Freq: Once | INTRAVENOUS | Status: AC
  Administered 2018-05-24: 400 mg via INTRAVENOUS

## 2018-05-24 MED ORDER — PROPOFOL 10 MG/ML IV BOLUS
INTRAVENOUS | Status: DC | PRN
  Administered 2018-05-24: 10 mg via INTRAVENOUS
  Administered 2018-05-24: 50 mg via INTRAVENOUS

## 2018-05-24 MED ORDER — METRONIDAZOLE 500 MG PO TABS
500.0000 mg | ORAL_TABLET | Freq: Three times a day (TID) | ORAL | Status: DC
  Administered 2018-05-24: 500 mg via ORAL
  Filled 2018-05-24: qty 1

## 2018-05-24 MED ORDER — LIDOCAINE 2% (20 MG/ML) 5 ML SYRINGE
INTRAMUSCULAR | Status: DC | PRN
  Administered 2018-05-24: 40 mg via INTRAVENOUS

## 2018-05-24 MED ORDER — LACTATED RINGERS IV SOLN
INTRAVENOUS | Status: DC
  Administered 2018-05-24: 08:00:00 via INTRAVENOUS

## 2018-05-24 MED ORDER — CIPROFLOXACIN IN D5W 400 MG/200ML IV SOLN
INTRAVENOUS | Status: AC
  Filled 2018-05-24: qty 200

## 2018-05-24 MED ORDER — POLYETHYLENE GLYCOL 3350 17 G PO PACK: 17.0000 g | PACK | Freq: Every day | ORAL | 0 refills | Status: AC | PRN

## 2018-05-24 MED FILL — metroNIDAZOLE 500 MG TABS: 500 | 6 days supply | Qty: 20 | Fill #0

## 2018-05-24 MED FILL — POLYETHYLENE GLYCOL 3350 PO: 14 days supply | Qty: 238 | Fill #0

## 2018-05-24 MED FILL — CIPROFLOXACIN HCL 500 MG TA: 500 | 6 days supply | Qty: 13 | Fill #0

## 2018-05-24 NOTE — Anesthesia Preprocedure Evaluation (Signed)
Anesthesia Evaluation  Patient identified by MRN, date of birth, ID band Patient awake    Reviewed: Allergy & Precautions, NPO status , Patient's Chart, lab work & pertinent test results  Airway Mallampati: II       Dental  (+) Poor Dentition   Pulmonary former smoker,    Pulmonary exam normal breath sounds clear to auscultation       Cardiovascular hypertension, Pt. on medications Normal cardiovascular exam Rhythm:Regular Rate:Normal     Neuro/Psych    GI/Hepatic negative GI ROS, Neg liver ROS,   Endo/Other  diabetes, Poorly Controlled, Type 2, Oral Hypoglycemic Agents  Renal/GU negative Renal ROS     Musculoskeletal negative musculoskeletal ROS (+)   Abdominal Normal abdominal exam  (+)   Peds  Hematology negative hematology ROS (+)   Anesthesia Other Findings   Reproductive/Obstetrics                             Anesthesia Physical Anesthesia Plan  ASA: II  Anesthesia Plan: MAC   Post-op Pain Management:    Induction:   PONV Risk Score and Plan: 2 and Ondansetron  Airway Management Planned: Natural Airway and Nasal Cannula  Additional Equipment:   Intra-op Plan:   Post-operative Plan:   Informed Consent: I have reviewed the patients History and Physical, chart, labs and discussed the procedure including the risks, benefits and alternatives for the proposed anesthesia with the patient or authorized representative who has indicated his/her understanding and acceptance.     Plan Discussed with: CRNA  Anesthesia Plan Comments:         Anesthesia Quick Evaluation

## 2018-05-24 NOTE — Progress Notes (Signed)
Pharmacy Antibiotic Note  Amanda SparrowBrenda C Kyer is a 77 y.o. female admitted on 05/17/2018 with Intra-abdominal infection. Patient already on metronidazole PO per MD. Pharmacy has been consulted for Ciprofloxacin dosing. Patient pending discharge per MD.   Plan: Ciprofloxacin 500mg  PO BID Monitor LOT, clinical course.   Height: 5' 2.5" (158.8 cm) Weight: 161 lb 2.5 oz (73.1 kg) IBW/kg (Calculated) : 51.25  Temp (24hrs), Avg:98.2 F (36.8 C), Min:97.9 F (36.6 C), Max:98.5 F (36.9 C)  Recent Labs  Lab 05/17/18 1142  05/18/18 0509 05/18/18 1134 05/19/18 0557 05/20/18 0333 05/20/18 1321 05/21/18 0502 05/22/18 0820 05/24/18 0454  WBC  --   --  15.5*  --  16.4* 11.7*  --   --  9.0  --   CREATININE  --    < > 1.04*  --  1.07*  --  0.81 0.72 0.67 0.89  LATICACIDVEN 2.7*  --   --  1.6  --   --   --   --   --   --    < > = values in this interval not displayed.    Estimated Creatinine Clearance: 49.3 mL/min (by C-G formula based on SCr of 0.89 mg/dL).    Allergies  Allergen Reactions  . Penicillins Other (See Comments)    Has patient had a PCN reaction causing immediate rash, facial/tongue/throat swelling, SOB or lightheadedness with hypotension: Unknown Has patient had a PCN reaction causing severe rash involving mucus membranes or skin necrosis: Unknown Has patient had a PCN reaction that required hospitalization: Unknown Has patient had a PCN reaction occurring within the last 10 years: No If all of the above answers are "NO", then may proceed with Cephalosporin use.    Deran Barro A. Jeanella CrazePierce, PharmD, BCPS Clinical Pharmacist [place] Pager: [phone] Please utilize Amion for appropriate phone number to reach the unit pharmacist [facility]   05/24/2018 10:28 AM

## 2018-05-24 NOTE — Op Note (Signed)
Operating Room Services Patient Name: Amanda King Procedure Date : 05/24/2018 MRN: [id number] Attending MD: Justice Britain , MD Date of Birth: August 24, 1940 CSN: [id number] Age: 77 Admit Type: Inpatient Procedure:                Colonoscopy Indications:              Evaluation on imaging study of clinically                            significant abnormality Providers:                Justice Britain, MD, Carlyn Reichert, RN, William Dalton, Technician Referring MD:             Estill Cotta. Loletha Carrow, MD, Dr. Wynelle Cleveland ([facility]) Medicines:                Monitored Anesthesia Care, Cipro 802 mg IV Complications:            No immediate complications. Estimated Blood Loss:     Estimated blood loss was minimal. Procedure:                Pre-Anesthesia Assessment:                           - Prior to the procedure, a History and Physical                            was performed, and patient medications and                            allergies were reviewed. The patient's tolerance of                            previous anesthesia was also reviewed. The risks                            and benefits of the procedure and the sedation                            options and risks were discussed with the patient.                            All questions were answered, and informed consent                            was obtained. Prior Anticoagulants: The patient has                            taken no previous anticoagulant or antiplatelet                            agents. ASA Grade Assessment: III - A patient with  severe systemic disease. After reviewing the risks                            and benefits, the patient was deemed in                            satisfactory condition to undergo the procedure.                           After obtaining informed consent, the colonoscope                            was passed under direct vision. Throughout  the                            procedure, the patient's blood pressure, pulse, and                            oxygen saturations were monitored continuously. The                            CF-HQ190L ([id number] Olympus adult colon was                            introduced through the anus and advanced to the 10                            cm into the ileum. The colonoscopy was performed                            without difficulty. The patient tolerated the                            procedure. The quality of the bowel preparation was                            evaluated using the BBPS [employer] Bowel Preparation                            Scale) with scores of: Right Colon = 3, Transverse                            Colon = 3 and Left Colon = 3 (entire mucosa seen                            well with no residual staining, small fragments of                            stool or opaque liquid). The total BBPS score                            equals 9. Scope In: 8:13:38 AM Scope Out: 8:39:01 AM Scope Withdrawal Time: 0 hours  13 minutes 51 seconds  Total Procedure Duration: 0 hours 25 minutes 23 seconds  Findings:      Skin tags were found on perianal exam.      The digital rectal exam findings include non-thrombosed internal       hemorrhoids. Pertinent negatives include no palpable rectal lesions.      The terminal ileum and ileocecal valve appeared normal. Biopsies were       taken with a cold forceps for histology to rule out IBD.      Normal mucosa was found in the transverse colon, at the hepatic flexure,       in the ascending colon and in the cecum. Biopsies were taken with a cold       forceps for histology to rule out chronic colitis.      Diffuse severe inflammation characterized by congestion (edema),       erosions, erythema, friability, granularity, loss of vascularity and       deep ulcerations was found in the proximal sigmoid colon, in the       descending colon and at the  splenic flexure. Biopsies were taken with a       cold forceps for histology to rule out chronic colitis, but region seems       most consistent with likely ischemic colitis.      Many small and large-mouthed diverticula were found in the recto-sigmoid       colon and sigmoid colon (including some in the proximal [place] where there       was some inflammation/erythema extending downwards) however no evidence       of overt diverticulitis/pus noted in the examined colon region.      Normal mucosa was found in the rectum.      Non-bleeding non-thrombosed internal hemorrhoids were found during       retroflexion, during perianal exam and during digital exam. The       hemorrhoids were Grade II (internal hemorrhoids that prolapse but reduce       spontaneously). Impression:               - Perianal skin tags found on perianal exam.                            Non-thrombosed internal hemorrhoids found on                            digital rectal exam.                           - The examined portion of the ileum was normal.                            Biopsied.                           - Normal mucosa in the transverse colon, at the                            hepatic flexure, in the ascending colon and in the  cecum. Biopsied.                           - Diffuse severe inflammation was found in the                            proximal sigmoid colon, in the descending colon and                            at the splenic flexure consistent with ischemic                            colitis. Biopsied.                           - Diverticulosis in the recto-sigmoid colon and in                            the sigmoid colon.                           - Normal mucosa in the rectum.                           - Non-bleeding non-thrombosed internal hemorrhoids. Recommendation:           - The patient will be observed post-procedure,                            until all discharge  criteria are met.                           - Return patient to hospital ward for ongoing care.                           - Patient has a contact number available for                            emergencies. The signs and symptoms of potential                            delayed complications were discussed with the                            patient. Return to normal activities tomorrow.                            Written discharge instructions were provided to the                            patient.                           - Resume previous diet.                           -  In effort to decrease risk of post-colonoscopy                            bacterial translocation in setting of significant                            inflammation presumed to be ischemic (biopsies                            pending), I would recommend 7-days of Cipro/Flagyl                            - data not clear if necessary in all patients but                            may be beneficial in some.                           - Patient may resume Aspirin in 1-week to decrease                            risk of bleeding post procedure while area                            continues to heal (hopefully if just ischemia).                           - Await pathology results.                           - Repeat colonoscopy for surveillance based on                            pathology results.                           - The findings and recommendations were discussed                            with the patient.                           - The findings and recommendations were discussed                            with the referring physician. Procedure Code(s):        --- Professional ---                           [phone], Colonoscopy, flexible; with biopsy, single                            or multiple Diagnosis Code(s):        --- Professional ---  K64.1, Second degree hemorrhoids                            K52.9, Noninfective gastroenteritis and colitis,                            unspecified                           K64.4, Residual hemorrhoidal skin tags                           K57.30, Diverticulosis of large intestine without                            perforation or abscess without bleeding                           R93.3, Abnormal findings on diagnostic imaging of                            other parts of digestive tract CPT copyright 2018 [employer]. All rights reserved. The codes documented in this report are preliminary and upon coder review may  be revised to meet current compliance requirements. Justice Britain, MD 05/24/2018 8:59:20 AM Number of Addenda: 0

## 2018-05-24 NOTE — Progress Notes (Signed)
Patient Discharge: Disposition: Patient discharged to home. Education: Reviewed medications, prescriptions, follow-up appointments and discharge instructions, verbalized understanding. IV: discontinued IV before discharge. Telemetry: Discontinued Tele before discharge. Transportation: Patient escorted out of the unit in w/c. Belongings: Pt took all her belongings with her.

## 2018-05-24 NOTE — Progress Notes (Signed)
PT Cancellation Note  Patient Details Name: Amanda SparrowBrenda C Semmel MRN: [id number] DOB: January 11, 1941   Cancelled Treatment:    Reason Eval/Treat Not Completed: Patient at procedure or test/unavailable. Pt currently off unit for procedure. Will continue to follow.   Marylynn PearsonLaura D Jacquette Canales 05/24/2018, 7:57 AM   Conni SlipperLaura Jennife Zaucha, PT, DPT Acute Rehabilitation Services Pager: [phone] Office: [phone]

## 2018-05-24 NOTE — Discharge Summary (Signed)
Physician Discharge Summary  Amanda King JWJ:[id number] DOB: February 09, 1941 DOA: 05/17/2018  PCP: Judy Pimple, MD  Admit date: 05/17/2018 Discharge date: 05/24/2018  Admitted From: home  Disposition:  home   Discharge Condition:  stable   CODE STATUS:  Full code   Diet recommendation:  Heart healthy, diabetic Consultations:  GI   Discharge Diagnoses:  Principal Problem:   Colitis presumed ischemic Active Problems: Urinary retention Dehydration   Diabetes type 2, uncontrolled (HCC)   Hyperlipidemia LDL goal <100   Essential hypertension   Narcolepsy with cataplexy   Brief Summary: Amanda King is a 77 y.o.femalewith medical history significant ofnarcolepsy with cataplexy; migraine; hypothyroidism; HTN; HLD; and DM presenting with abdominal pain and nausea. No vomiting.  In ED > pain 7/10, stomach distended, tympanic CT abd/pelvis w/o contrast in ED >  1. Moderate stool and air in the colon. No bowel obstruction or active inflammation. Normal appendix. 2. Fatty liver. 3. Cholelithiasis. 4. Right renal hypodense lesions as described. Further evaluation with MRI or ultrasound on a nonemergent basis recommended.  CTA abd pelvis> notable new findings No CT evidence of mesenteric ischemia.-  Findings most consistent with colitis involving the splenic flexure and to a lesser degree the descending colon. Sigmoid diverticulosis without active inflammatory changes.   Hospital Course:  Principal Problem:  Colitis with ileus  Leukocytosis - large amount of stool on imaging may have contributed to illeus picture- this improved with laxatives  -colitis on CT may be Ischemic vs infectious per GI- GI recommended to d/c Cipro Flagyl- went for colonoscopy on 12/13 but has some upper air way congestion and thus she could not be sedated - colonoscopy thus cancelled- GI plans to scope again tomorrow -  she has a non-obstructive bowel gas pattern on imaging now -underwent colonoscopy  today- noted to have findings consistent with ischemic colitis (I spoke with GI)- biopsies done- GI will call her with results-  GI  Further recommended 7 more days of Cipro Flagyl and resume aspirin in 1 wk.  - she is tolerating solid food and will be discharged. T     Active Problems:  Lactic acidosis  - resolved- ? Due to ischemic colitis in addition to dehydration  Upper airway congestion - she has some mild upper airway congestion early on in the admission on and off during the hospital stay but has never been hypoxic.  - No h/o COPD or asthma.  -I evaluated her when she came up from colonoscopy. She had no wheezing or rhonchi, no cough - the following day I walked with her and checked pulse ox which was 98% - gave ok to GI to scope    Dehydration- urinary retention - foley was required (likely retaining due to constipation and GI issues) -  she had poor urine output- given fluid bolused and continuous IVF  -  voiding trial given a couple of days ago and now voiding well    Diabetes type 2, uncontrolled  - SSI used in the hospital- sugars quite stable -  held Glipizide, Januvia and Metformin- which can be resumed now - Her last A1c was 8.0 on 10/1    Essential hypertension - cont HCTZ and Lisinopril on d/c    Narcolepsy with cataplexy - cont Imipramine and Ritalin  Hypothyroid - cont Synthroid  Internal hemorrhoids - noted on colonoscopy -     Discharge Exam: Vitals:   05/24/18 0900 05/24/18 0915  BP: (!) 160/59 (!) 134/39  Pulse: 78  Resp: 18 16  Temp:    SpO2: 97% 95%   Vitals:   05/24/18 0750 05/24/18 0845 05/24/18 0900 05/24/18 0915  BP: (!) 174/68 (!) 130/53 (!) 160/59 (!) 134/39  Pulse: 79 85 78   Resp: 20 15 18 16   Temp: 98 F (36.7 C) 98 F (36.7 C)    TempSrc: Oral Oral    SpO2: 100% 100% 97% 95%  Weight: 73.1 kg     Height: 5' 2.5" (1.588 m)       General: Pt is alert, awake, not in acute distress Cardiovascular: RRR, S1/S2 +, no  rubs, no gallops Respiratory: CTA bilaterally, no wheezing, no rhonchi Abdominal: Soft, NT, ND, bowel sounds + Extremities: no edema, no cyanosis   Discharge Instructions  Discharge Instructions    Diet - low sodium heart healthy   Complete by:  As directed    And Diabetic   Increase activity slowly   Complete by:  As directed      Allergies as of 05/24/2018      Reactions   Penicillins Other (See Comments)   Has patient had a PCN reaction causing immediate rash, facial/tongue/throat swelling, SOB or lightheadedness with hypotension: Unknown Has patient had a PCN reaction causing severe rash involving mucus membranes or skin necrosis: Unknown Has patient had a PCN reaction that required hospitalization: Unknown Has patient had a PCN reaction occurring within the last 10 years: No If all of the above answers are "NO", then may proceed with Cephalosporin use.      Medication List    STOP taking these medications   aspirin 81 MG tablet   ibuprofen 200 MG tablet Commonly known as:  ADVIL,MOTRIN     TAKE these medications   cetirizine 10 MG tablet Commonly known as:  ZYRTEC Take 10 mg by mouth daily as needed for allergies or rhinitis.   ciprofloxacin 500 MG tablet Commonly known as:  CIPRO Take 1 tablet (500 mg total) by mouth 2 (two) times daily.   glipiZIDE 10 MG 24 hr tablet Commonly known as:  GLUCOTROL XL Take 1 tablet (10 mg total) by mouth daily with breakfast.   hydrochlorothiazide 25 MG tablet Commonly known as:  HYDRODIURIL Take 1 tablet (25 mg total) by mouth daily.   imipramine 50 MG tablet Commonly known as:  TOFRANIL Take 1 tablet (50 mg total) by mouth at bedtime.   levothyroxine 88 MCG tablet Commonly known as:  SYNTHROID, LEVOTHROID Take 1 tablet (88 mcg total) by mouth daily.   lisinopril 20 MG tablet Commonly known as:  PRINIVIL,ZESTRIL TAKE 1 TABLET BY MOUTH ONCE DAILY   metFORMIN 750 MG 24 hr tablet Commonly known as:   GLUCOPHAGE-XR Take 1 tablet (750 mg total) by mouth daily with breakfast.   methylphenidate 20 MG tablet Commonly known as:  RITALIN Take 1 tablet (20 mg total) by mouth 3 (three) times daily with meals.   metroNIDAZOLE 500 MG tablet Commonly known as:  FLAGYL Take 1 tablet (500 mg total) by mouth every 8 (eight) hours.   polyethylene glycol packet Commonly known as:  MIRALAX / GLYCOLAX Take 17 g by mouth daily as needed.   simvastatin 80 MG tablet Commonly known as:  ZOCOR Take 1 tablet (80 mg total) by mouth at bedtime.   sitaGLIPtin 100 MG tablet Commonly known as:  JANUVIA Take 1 tablet (100 mg total) by mouth daily.      Follow-up Information    [facility] Follow up.  Specialty:  Home Health Services Why:  Home Health Physical Therapy Contact information: [address] [place] [address] [phone]        Judy Pimple, MD. Schedule an appointment as soon as possible for a visit in 1 week(s).   Specialties:  Family Medicine, Radiology Contact information: [address] [place] [address] [phone]          Allergies  Allergen Reactions  . Penicillins Other (See Comments)    Has patient had a PCN reaction causing immediate rash, facial/tongue/throat swelling, SOB or lightheadedness with hypotension: Unknown Has patient had a PCN reaction causing severe rash involving mucus membranes or skin necrosis: Unknown Has patient had a PCN reaction that required hospitalization: Unknown Has patient had a PCN reaction occurring within the last 10 years: No If all of the above answers are "NO", then may proceed with Cephalosporin use.      Procedures/Studies:    - Perianal skin tags found on perianal exam.                            Non-thrombosed internal hemorrhoids found on                            digital rectal exam.                           - The examined portion of the ileum was normal.                             Biopsied.                           - Normal mucosa in the transverse colon, at the                            hepatic flexure, in the ascending colon and in the                            cecum. Biopsied.                           - Diffuse severe inflammation was found in the                            proximal sigmoid colon, in the descending colon and                            at the splenic flexure consistent with ischemic                            colitis. Biopsied.                           - Diverticulosis in the recto-sigmoid colon and in                            the sigmoid colon.                           -  Normal mucosa in the rectum.                           - Non-bleeding non-thrombosed internal hemorrhoids.  Ct Abdomen Pelvis Wo Contrast  Result Date: 05/17/2018 CLINICAL DATA:  77 year old female with abdominal distention. Concern for bowel obstruction. EXAM: CT ABDOMEN AND PELVIS WITHOUT CONTRAST TECHNIQUE: Multidetector CT imaging of the abdomen and pelvis was performed following the standard protocol without IV contrast. COMPARISON:  Abdominal radiograph dated 05/17/2018 FINDINGS: Evaluation of this exam is limited in the absence of intravenous contrast. Lower chest: There are bibasilar atelectatic changes. No intra-abdominal free air or free fluid. Hepatobiliary: Diffuse fatty infiltration of the liver. There is a stone in the gallbladder. No pericholecystic fluid or evidence of acute cholecystitis by CT. Pancreas: Unremarkable. No pancreatic ductal dilatation or surrounding inflammatory changes. Spleen: Normal in size without focal abnormality. Adrenals/Urinary Tract: The adrenal glands are unremarkable. Right renal hypodense lesions measure up to 5 cm in the lower pole. The larger lesion demonstrates a lobulated margin and is not well evaluated on this noncontrast CT. Further evaluation with MRI or ultrasound on a nonemergent basis recommended. Subcentimeter low  attenuating focus in the lateral interpolar left kidney appears to demonstrate fat attenuation and may represent a small angiomyolipoma. There is no hydronephrosis or nephrolithiasis on either side. The visualized ureters and urinary bladder appear unremarkable. Stomach/Bowel: There is moderate amount of air and stool throughout the colon. There is no bowel obstruction or active inflammation. The appendix is normal. Vascular/Lymphatic: Advanced aortoiliac atherosclerotic disease. No portal venous gas. There is no adenopathy. Reproductive: Hysterectomy. No pelvic mass. Other: Small fat. Musculoskeletal: Degenerative changes of the spine. No acute osseous pathology. IMPRESSION: 1. Moderate stool and air in the colon. No bowel obstruction or active inflammation. Normal appendix. 2. Fatty liver. 3. Cholelithiasis. 4. Right renal hypodense lesions as described. Further evaluation with MRI or ultrasound on a nonemergent basis recommended. Electronically Signed   By: Elgie CollardArash  Radparvar M.D.   On: 05/17/2018 02:42   Dg Abd 1 View  Result Date: 05/22/2018 CLINICAL DATA:  Abdominal distension. EXAM: ABDOMEN - 1 VIEW COMPARISON:  Radiograph of May 19, 2018. FINDINGS: The bowel gas pattern is normal. No radio-opaque calculi or other significant radiographic abnormality are seen. IMPRESSION: No evidence of bowel obstruction or ileus. Electronically Signed   By: Lupita RaiderJames  Green Jr, M.D.   On: 05/22/2018 09:14   Dg Abd 1 View  Result Date: 05/19/2018 CLINICAL DATA:  Colitis. EXAM: ABDOMEN - 1 VIEW COMPARISON:  Abdominal radiograph May 19, 2018 at 1035 hours FINDINGS: Gas distended colon and to lesser extent small bowel is similar. No intraperitoneal free air. No intra-abdominal mass effect. Rectum is not included in field of view. Small suspected LEFT pleural effusion. Lumbar levoscoliosis. IMPRESSION: 1. Persistent gas distended colon and to lesser extent small bowel seen with distal bowel obstruction or ileus. No  free air. Electronically Signed   By: Awilda Metroourtnay  Bloomer M.D.   On: 05/19/2018 17:30   Dg Abd 1 View  Result Date: 05/18/2018 CLINICAL DATA:  Abdominal pain, distention EXAM: ABDOMEN - 1 VIEW COMPARISON:  05/17/2018 FINDINGS: Mild gaseous distention of both large and small bowel loops. Moderate to large stool burden in the colon. No organomegaly or free air. IMPRESSION: Mild diffuse gaseous distention of both large and small bowel may reflect ileus. Moderate to large stool burden. Electronically Signed   By: Charlett NoseKevin  Dover M.D.   On: 05/18/2018  10:23   Dg Abd Acute W/chest  Result Date: 05/17/2018 CLINICAL DATA:  77 year old female with abdominal distention. EXAM: DG ABDOMEN ACUTE W/ 1V CHEST COMPARISON:  Abdominal CT dated 05/17/2018 FINDINGS: There is shallow inspiration with bibasilar atelectasis. No focal consolidation, pleural effusion or pneumothorax. Top-normal cardiac size. There is moderate colonic stool burden. No small bowel dilatation or evidence of obstruction. No free air or radiopaque calculi. Degenerative changes of the spine. IMPRESSION: 1. No acute cardiopulmonary process. 2. Moderate colonic stool burden. No bowel obstruction. Electronically Signed   By: Elgie Collard M.D.   On: 05/17/2018 02:22   Dg Abd Portable 1v  Result Date: 05/19/2018 CLINICAL DATA:  Constipation. Follow-up following to enemas yesterday. EXAM: PORTABLE ABDOMEN - 1 VIEW COMPARISON:  Upright view of the upper abdomen of May 18, 2018 FINDINGS: There remains considerable stool throughout the colon and rectum. There's some large bowel gas. There's also gas within loops of minimally distended and normal caliber small bowel. No free extraluminal gas collections are observed. There's some gas in the stomach. No abnormal soft tissue calcifications are observed. There's curvature of the lumbar spine convex toward the left which is stable. IMPRESSION: Persistent large colonic and rectal stool burdens. Persistent  small bowel gas within normal caliber and some mildly dilated small bowel loops likely secondary to the colonic stool burden and relative stasis. Electronically Signed   By: David  [place] M.D.   On: 05/19/2018 11:16   Ct Angio Abd/pel W And/or Wo Contrast  Result Date: 05/17/2018 CLINICAL DATA:  77 year old female with abdominal pain and distention. Concern for mesenteric ischemia. EXAM: CTA ABDOMEN AND PELVIS wITHOUT AND WITH CONTRAST TECHNIQUE: Multidetector CT imaging of the abdomen and pelvis was performed using the standard protocol during bolus administration of intravenous contrast. Multiplanar reconstructed images and MIPs were obtained and reviewed to evaluate the vascular anatomy. CONTRAST:  80mL ISOVUE-370 IOPAMIDOL (ISOVUE-370) INJECTION 76% COMPARISON:  CT of the abdomen pelvis dated 05/17/2018 FINDINGS: VASCULAR Aorta: Moderate atherosclerotic disease of the aorta. No aneurysmal dilatation or dissection. Celiac: Patent without evidence of aneurysm, dissection, vasculitis or significant stenosis. SMA: Atherosclerotic calcification of the origin of the SMA. The SMA is widely patent. Renals: Atherosclerotic calcification of the ostia of the left renal artery. The renal arteries are patent. IMA: Patent without evidence of aneurysm, dissection, vasculitis or significant stenosis. Inflow: Patent without evidence of aneurysm, dissection, vasculitis or significant stenosis. Proximal Outflow: Bilateral common femoral and visualized portions of the superficial and profunda femoral arteries are patent without evidence of aneurysm, dissection, vasculitis or significant stenosis. Veins: No obvious venous abnormality within the limitations of this arterial phase study. No portal venous gas. Review of the MIP images confirms the above findings. NON-VASCULAR Lower chest: Bibasilar atelectasis/scarring. No intra-abdominal free air or free fluid. Hepatobiliary: Diffuse fatty infiltration of the liver. A 4.5 x 4.2 cm  hypodense lesion in the right lobe of the liver with peripheral enhancement is not well characterized but may represent a hemangioma. Further characterization with MRI on a nonemergent basis recommended. No intrahepatic biliary ductal dilatation. There is a small stone in the gallbladder. No pericholecystic fluid. Pancreas: Unremarkable. No pancreatic ductal dilatation or surrounding inflammatory changes. Spleen: Normal in size without focal abnormality. Adrenals/Urinary Tract: The adrenal glands are unremarkable. Multiple right renal cysts measuring up to 5 cm in the inferior pole of the right kidney. There is no hydronephrosis on either side. There is symmetric enhancement of the kidneys bilaterally. The visualized ureters and urinary bladder appear unremarkable.  Stomach/Bowel: There is moderate amount of air and stool throughout the colon. There is small sigmoid diverticula without active inflammatory changes. There is thickening of the splenic flexure and to a lesser degree the descending colon with mild surrounding inflammatory changes most consistent with colitis. No pneumatosis. There is no bowel obstruction. The appendix is normal. Lymphatic: No adenopathy. Reproductive: Hysterectomy. No pelvic mass. Other: Small fat containing umbilical hernia. Musculoskeletal: Degenerative changes of the spine. No acute osseous pathology. IMPRESSION: VASCULAR Moderate atherosclerotic disease of the aorta. No aneurysmal dilatation or dissection. No acute or significant arterial occlusion. NON-VASCULAR 1. No CT evidence of mesenteric ischemia. 2. Findings most consistent with colitis involving the splenic flexure and to a lesser degree the descending colon. No bowel obstruction. Normal appendix. 3. Sigmoid diverticulosis without active inflammatory changes. 4. Fatty liver. 5. Indeterminate right hepatic hypodense lesion, likely a hemangioma. Further characterization with MRI on a nonemergent basis recommended. 6.  Cholelithiasis. 7. Right renal cysts. No hydronephrosis. Electronically Signed   By: Elgie Collard M.D.   On: 05/17/2018 04:26     The results of significant diagnostics from this hospitalization (including imaging, microbiology, ancillary and laboratory) are listed below for reference.     Microbiology: Recent Results (from the past 240 hour(s))  Blood Culture (routine x 2)     Status: None   Collection Time: 05/17/18  4:46 AM  Result Value Ref Range Status   Specimen Description BLOOD RIGHT ANTECUBITAL  Final   Special Requests   Final    BOTTLES DRAWN AEROBIC AND ANAEROBIC Blood Culture results may not be optimal due to an inadequate volume of blood received in culture bottles   Culture   Final    NO GROWTH 5 DAYS Performed at [facility], [address], [place], [place] [address]    Report Status 05/22/2018 FINAL  Final  Blood Culture (routine x 2)     Status: None   Collection Time: 05/17/18  4:47 AM  Result Value Ref Range Status   Specimen Description BLOOD RIGHT UPPER ARM  Final   Special Requests   Final    BOTTLES DRAWN AEROBIC AND ANAEROBIC Blood Culture results may not be optimal due to an inadequate volume of blood received in culture bottles   Culture   Final    NO GROWTH 5 DAYS Performed at [facility], [address] [place], [place] [address]    Report Status 05/22/2018 FINAL  Final     Labs: BNP (last 3 results) No results for input(s): BNP in the last 8760 hours. Basic Metabolic Panel: Recent Labs  Lab 05/19/18 0557 05/20/18 1321 05/21/18 0502 05/22/18 0820 05/24/18 0454  NA 135 137 139 137 140  K 3.7 4.1 3.7 3.5 3.4*  CL 104 107 104 101 100  CO2 21* 23 22 28 27   GLUCOSE 196* 247* 148* 195* 180*  BUN 18 11 9  7* 6*  CREATININE 1.07* 0.81 0.72 0.67 0.89  CALCIUM 6.9* 7.6* 7.8* 7.9* 8.6*  MG 1.7  --   --   --   --    Liver Function Tests: No results for input(s): AST, ALT, ALKPHOS, BILITOT, PROT, ALBUMIN in the last 168  hours. No results for input(s): LIPASE, AMYLASE in the last 168 hours. No results for input(s): AMMONIA in the last 168 hours. CBC: Recent Labs  Lab 05/18/18 0509 05/19/18 0557 05/20/18 0333 05/22/18 0820  WBC 15.5* 16.4* 11.7* 9.0  HGB 10.7* 9.8* 9.9* 10.7*  HCT 32.9*  31.5* 32.2* 32.6*  MCV 91.6 94.0 93.3 91.1  PLT 207 217 237 282   Cardiac Enzymes: No results for input(s): CKTOTAL, CKMB, CKMBINDEX, TROPONINI in the last 168 hours. BNP: Invalid input(s): POCBNP CBG: Recent Labs  Lab 05/23/18 1132 05/23/18 1705 05/23/18 2055 05/24/18 0732 05/24/18 1136  GLUCAP 193* 194* 169* 158* 181*   D-Dimer No results for input(s): DDIMER in the last 72 hours. Hgb A1c No results for input(s): HGBA1C in the last 72 hours. Lipid Profile No results for input(s): CHOL, HDL, LDLCALC, TRIG, CHOLHDL, LDLDIRECT in the last 72 hours. Thyroid function studies No results for input(s): TSH, T4TOTAL, T3FREE, THYROIDAB in the last 72 hours.  Invalid input(s): FREET3 Anemia work up No results for input(s): VITAMINB12, FOLATE, FERRITIN, TIBC, IRON, RETICCTPCT in the last 72 hours. Urinalysis    Component Value Date/Time   COLORURINE YELLOW 05/17/2018 0350   APPEARANCEUR CLEAR 05/17/2018 0350   LABSPEC 1.025 05/17/2018 0350   PHURINE 5.0 05/17/2018 0350   GLUCOSEU 150 (A) 05/17/2018 0350   HGBUR NEGATIVE 05/17/2018 0350   BILIRUBINUR NEGATIVE 05/17/2018 0350   BILIRUBINUR small 11/24/2011 1030   KETONESUR NEGATIVE 05/17/2018 0350   PROTEINUR NEGATIVE 05/17/2018 0350   UROBILINOGEN 0.2 11/24/2011 1030   NITRITE NEGATIVE 05/17/2018 0350   LEUKOCYTESUR TRACE (A) 05/17/2018 0350   Sepsis Labs Invalid input(s): PROCALCITONIN,  WBC,  LACTICIDVEN Microbiology Recent Results (from the past 240 hour(s))  Blood Culture (routine x 2)     Status: None   Collection Time: 05/17/18  4:46 AM  Result Value Ref Range Status   Specimen Description BLOOD RIGHT ANTECUBITAL  Final   Special Requests    Final    BOTTLES DRAWN AEROBIC AND ANAEROBIC Blood Culture results may not be optimal due to an inadequate volume of blood received in culture bottles   Culture   Final    NO GROWTH 5 DAYS Performed at [facility], [address] [place], [place] [address]    Report Status 05/22/2018 FINAL  Final  Blood Culture (routine x 2)     Status: None   Collection Time: 05/17/18  4:47 AM  Result Value Ref Range Status   Specimen Description BLOOD RIGHT UPPER ARM  Final   Special Requests   Final    BOTTLES DRAWN AEROBIC AND ANAEROBIC Blood Culture results may not be optimal due to an inadequate volume of blood received in culture bottles   Culture   Final    NO GROWTH 5 DAYS Performed at [facility], [address], [place], [place] [address]    Report Status 05/22/2018 FINAL  Final     Time coordinating discharge in minutes: 60  SIGNED:   Calvert Cantor, MD  [facility] 05/24/2018, 3:20 PM Pager   If 7PM-7AM, please contact night-coverage [email or web address] Password [email or web address]

## 2018-05-24 NOTE — Anesthesia Postprocedure Evaluation (Signed)
Anesthesia Post Note  Patient: Amanda King  Procedure(s) Performed: COLONOSCOPY (N/A ) BIOPSY     Patient location during evaluation: Endoscopy Anesthesia Type: MAC Level of consciousness: awake and sedated Pain management: pain level controlled Vital Signs Assessment: post-procedure vital signs reviewed and stable Respiratory status: spontaneous breathing Cardiovascular status: stable Postop Assessment: no apparent nausea or vomiting Anesthetic complications: no    Last Vitals:  Vitals:   05/24/18 0750 05/24/18 0845  BP: (!) 174/68 (!) 130/53  Pulse: 79 85  Resp: 20 15  Temp: 36.7 C 36.7 C  SpO2: 100% 100%    Last Pain:  Vitals:   05/24/18 0853  TempSrc:   PainSc: 0-No pain   Pain Goal: Patients Stated Pain Goal: 0 (05/23/18 2250)               Huston Foley

## 2018-05-24 NOTE — Discharge Instructions (Signed)
You can resume your aspirin in 1 wk as long as there is no further bleeding.  You were cared for by a hospitalist during your hospital stay. If you have any questions about your discharge medications or the care you received while you were in the hospital after you are discharged, you can call the unit and asked to speak with the hospitalist on call if the hospitalist that took care of you is not available. Once you are discharged, your primary care physician will handle any further medical issues.   Please note that NO REFILLS for any discharge medications will be authorized once you are discharged, as it is imperative that you return to your primary care physician (or establish a relationship with a primary care physician if you do not have one) for your aftercare needs so that they can reassess your need for medications and monitor your lab values.  Please take all your medications with you for your next visit with your Primary MD. Please ask your Primary MD to get all Hospital records sent to his/her office. Please request your Primary MD to go over all hospital test results at the follow up.   If you experience worsening of your admission symptoms, develop shortness of breath, chest pain, suicidal or homicidal thoughts or a life threatening emergency, you must seek medical attention immediately by calling 911 or calling your MD.   Bonita QuinYou must read the complete instructions/literature along with all the possible adverse reactions/side effects for all the medicines you take including new medications that have been prescribed to you. Take new medicines after you have completely understood and accpet all the possible adverse reactions/side effects.    Do not drive when taking pain medications or sedatives.     Do not take more than prescribed Pain, Sleep and Anxiety Medications   If you have smoked or chewed Tobacco in the last 2 yrs please stop. Stop any regular alcohol  and or recreational drug  use.   Wear Seat belts while driving.

## 2018-05-24 NOTE — Transfer of Care (Signed)
Immediate Anesthesia Transfer of Care Note  Patient: Amanda King  Procedure(s) Performed: COLONOSCOPY (N/A ) BIOPSY  Patient Location: PACU  Anesthesia Type:MAC  Level of Consciousness: drowsy  Airway & Oxygen Therapy: Patient Spontanous Breathing and Patient connected to face mask oxygen  Post-op Assessment: Report given to RN and Post -op Vital signs reviewed and stable  Post vital signs: Reviewed and stable  Last Vitals:  Vitals Value Taken Time  BP 130/53 05/24/2018  8:45 AM  Temp 36.7 C 05/24/2018  8:45 AM  Pulse 84 05/24/2018  8:48 AM  Resp 14 05/24/2018  8:48 AM  SpO2 98 % 05/24/2018  8:48 AM  Vitals shown include unvalidated device data.  Last Pain:  Vitals:   05/24/18 0845  TempSrc: Oral  PainSc: 0-No pain      Patients Stated Pain Goal: 0 (60/63/01 6010)  Complications: No apparent anesthesia complications

## 2018-05-24 NOTE — Interval H&P Note (Signed)
History and Physical Interval Note:  05/24/2018 7:38 AM  [name]  has presented today for surgery, with the diagnosis of Splenic flexure / left colon abnormalities on CT scan  The various methods of treatment have been discussed with the patient and family. After consideration of risks, benefits and other options for treatment, the patient has consented to  Procedure(s): COLONOSCOPY (N/A) as a surgical intervention .  The patient's history has been reviewed, patient examined, no change in status, stable for surgery.  I have reviewed the patient's chart and labs.  Questions were answered to the patient's satisfaction.     Gannett Coabriel Mansouraty Jr

## 2018-05-25 ENCOUNTER — Telehealth: Payer: Self-pay | Admitting: *Deleted

## 2018-05-25 NOTE — Telephone Encounter (Signed)
Transition Care Management Follow-up Telephone Call   Date discharged?   How have you been since you were released from the hospital? "Feeling better, sleeping and resting"   Do you understand why you were in the hospital? YES   Do you understand the discharge instructions? YES   Where were you discharged to? Home   Items Reviewed:  Medications reviewed: YES  Allergies reviewed: YES  Dietary changes reviewed: YES  Referrals reviewed: YES   Functional Questionnaire:   Activities of Daily Living (ADLs):   She states they are independent in the following: grooming, going to the bathroom, dressing,, ambulation, bathing,  feeding States they require assistance with the following: Nothing  Any transportation issues/concerns?: NO   Any patient concerns? NO   Confirmed importance and date/time of follow-up visits scheduled YES    Provider Appointment booked with Dr. Milinda Antisower 06/01/18 at 9:00  Confirmed with patient if condition begins to worsen call PCP or go to the ER.  Patient was given the office number and encouraged to call back with question or concerns.  : YES

## 2018-05-27 ENCOUNTER — Encounter: Payer: Self-pay | Admitting: Gastroenterology

## 2018-06-01 ENCOUNTER — Encounter: Payer: Self-pay | Admitting: Family Medicine

## 2018-06-01 ENCOUNTER — Ambulatory Visit: Payer: Medicare Other | Admitting: Family Medicine

## 2018-06-01 VITALS — BP 132/60 | HR 108 | Temp 97.7°F | Ht 61.5 in | Wt 156.8 lb

## 2018-06-01 DIAGNOSIS — E785 Hyperlipidemia, unspecified: Secondary | ICD-10-CM

## 2018-06-01 DIAGNOSIS — I10 Essential (primary) hypertension: Secondary | ICD-10-CM

## 2018-06-01 DIAGNOSIS — K559 Vascular disorder of intestine, unspecified: Secondary | ICD-10-CM

## 2018-06-01 DIAGNOSIS — E1165 Type 2 diabetes mellitus with hyperglycemia: Secondary | ICD-10-CM

## 2018-06-01 NOTE — Telephone Encounter (Signed)
Patient seen by PCP today.

## 2018-06-01 NOTE — Progress Notes (Signed)
Subjective:    Patient ID: Amanda King C King, female    DOB: 01/02/1941, 77 y.o.   MRN: [id number]  HPI Here for f/u of hospitalization from 12/9 to 05/24/18 for colitis (presumed ischemic)   Hospital course (A/P) as follows from chart: Hospital Course:  Principal Problem: Colitis with ileus Leukocytosis - large amount of stool on imaging may have contributed to illeus picture- this improved with laxatives -colitis on CT may beIschemic vs infectious per GI- GI recommended to d/c Cipro Flagyl- went for colonoscopy on 12/13but has some upper air way congestion and thus she could not be sedated - colonoscopy thuscancelled- GI plans to scope again tomorrow - she has a non-obstructive bowel gas pattern on imaging now -underwent colonoscopy today- noted to have findings consistent with ischemic colitis (I spoke with GI)- biopsies done- GI will call her with results-  GI  Further recommended 7 more days of Cipro Flagyl and resume aspirin in 1 wk.  - she is tolerating solid food and will be discharged. T    Active Problems:  Lactic acidosis  - resolved- ? Due to ischemic colitis in addition to dehydration  Pt no longer feels sick  Upper airway congestion -she has some mild upper airway congestionearly on in the admissionon and off during the hospital stay but has never been hypoxic.  -No h/o COPD or asthma. -I evaluated her when she came up from colonoscopy.She had no wheezing or rhonchi, no cough - the following dayI walked with her and checked pulse oxwhich was 98% - gave ok to GI to scope    No c/o now re: sob or cough  Dehydration- urinary retention - foley was required (likely retaining due to constipation and GI issues) - she had poor urine output- given fluid bolused and continuous IVF  - voiding trial given a couple of days ago and now voiding well  Taking in good fluids now   Diabetes type 2, uncontrolled  - SSI used in the hospital- sugars quite  stable - held Glipizide, Januvia and Metformin- which can be resumed now - Her last A1c was 8.0 on 10/1  Not back to full diet yet but glucose levels have been better at home   Essential hypertension -contHCTZand Lisinopril on d/c  No problems since d/c  Narcolepsy with cataplexy - cont Imipramine and Ritalin  Hypothyroid - cont Synthroid  Internal hemorrhoids (seen on colonoscopy)  CT angio of abd pelvis  IMPRESSION: VASCULAR Moderate atherosclerotic disease of the aorta. No aneurysmal dilatation or dissection. No acute or significant arterial occlusion. NON-VASCULAR 1. No CT evidence of mesenteric ischemia. 2. Findings most consistent with colitis involving the splenic flexure and to a lesser degree the descending colon. No bowel obstruction. Normal appendix. 3. Sigmoid diverticulosis without active inflammatory changes. 4. Fatty liver. 5. Indeterminate right hepatic hypodense lesion, likely a hemangioma. Further characterization with MRI on a nonemergent basis recommended. 6. Cholelithiasis. 7. Right renal cysts. No hydronephrosis. Electronically Signed   By: Elgie CollardArash  Radparvar M.D.   On: 05/17/2018 04:26   D/c on cipro and flagyl  HH consulted   Blood cultures all neg  Lab Results  Component Value Date   CREATININE 0.89 05/24/2018   BUN 6 (L) 05/24/2018   NA 140 05/24/2018   K 3.4 (L) 05/24/2018   CL 100 05/24/2018   CO2 27 05/24/2018   Lab Results  Component Value Date   ALT 30 05/17/2018   AST 24 05/17/2018   ALKPHOS 81 05/17/2018  BILITOT 0.5 05/17/2018    Lab Results  Component Value Date   WBC 9.0 05/22/2018   HGB 10.7 (L) 05/22/2018   HCT 32.6 (L) 05/22/2018   MCV 91.1 05/22/2018   PLT 282 05/22/2018     appt is scheduled with Dr Myrtie Neither on 1/30   Today: Wt Readings from Last 3 Encounters:  06/01/18 156 lb 12 oz (71.1 kg)  05/24/18 161 lb 2.5 oz (73.1 kg)  04/14/18 161 lb 9.6 oz (73.3 kg)   29.14 kg/m   BP Readings from Last 3  Encounters:  06/01/18 132/60  05/24/18 (!) 134/39  04/14/18 (!) 149/73   Pulse Readings from Last 3 Encounters:  06/01/18 (!) 108  05/24/18 78  04/14/18 (!) 101   DM2 Lab Results  Component Value Date   HGBA1C 8.0 (H) 03/09/2018   On glipizide and ace Metformin januvia    Cholesterol Lab Results  Component Value Date   CHOL 188 03/09/2018   HDL 39.40 03/09/2018   LDLCALC 73 06/21/2014   LDLDIRECT 94.0 03/09/2018   TRIG 357.0 (H) 03/09/2018   CHOLHDL 5 03/09/2018   simvastain -top dose   Finished abx last night  Has another colonoscopy 6 mo   She declined home health  Family is caring for her  Weak and tired  Pain is entirely gone  Appetite is not great  Drinking fluids and some juices   Blood glucose 140s now  Thinks diet is going to improve a lot - no longer working  When she feels up to it - will walk/use treadmill    Patient Active Problem List   Diagnosis Date Noted  . Ischemic colitis (HCC)   . Ileus (HCC)   . Constipation   . Colitis 05/18/2018  . Colitis presumed infectious 05/17/2018  . Screening mammogram, encounter for 03/30/2018  . Routine general medical examination at a health care facility 01/31/2016  . Narcolepsy with cataplexy 10/31/2014  . Stress reaction 03/28/2014  . Encounter for Medicare annual wellness exam 09/22/2013  . Hypersomnia, persistent 07/15/2013  . Colon cancer screening 12/31/2011  . Obesity (BMI 30-39.9) 07/01/2011  . ANXIETY 05/06/2010  . DERMATOPHYTOSIS OF NAIL 12/21/2008  . Hypothyroidism 09/02/2006  . Diabetes type 2, uncontrolled (HCC) 09/02/2006  . Hyperlipidemia LDL goal <100 09/02/2006  . NARCOLEPSY W/CATAPLEXY 09/02/2006  . Essential hypertension 09/02/2006  . PNEUMONIA, HX OF 09/02/2006   Past Medical History:  Diagnosis Date  . Diabetes mellitus    Type II  . Hyperlipidemia   . Hypertension   . Hypothyroidism   . Migraine   . Narcolepsy with cataplexy   . Pneumonia 08/2001   Past Surgical  History:  Procedure Laterality Date  . APPENDECTOMY  age 58  . BIOPSY  05/24/2018   Procedure: BIOPSY;  Surgeon: Meridee Score Netty Starring., MD;  Location: [facility] ENDOSCOPY;  Service: Gastroenterology;;  . COLONOSCOPY N/A 05/24/2018   Procedure: COLONOSCOPY;  Surgeon: Lemar Lofty., MD;  Location: [facility] ENDOSCOPY;  Service: Gastroenterology;  Laterality: N/A;  . hemilaminectomy     C5-6; C6-7  . PARTIAL HYSTERECTOMY  age 63  . POSTERIOR CERVICAL FUSION/FORAMINOTOMY  02/05/1975   C5-C6-C7   Social History   Tobacco Use  . Smoking status: Former Smoker    Last attempt to quit: 06/09/1980    Years since quitting: 38.0  . Smokeless tobacco: Never Used  Substance Use Topics  . Alcohol use: No    Alcohol/week: 0.0 standard drinks  . Drug use: No   Family History  Problem Relation Age of Onset  . Depression Brother        suicide  . Diabetes Brother   . Heart disease Mother        MI  . Peripheral vascular disease Mother   . Hyperlipidemia Mother   . Diabetes Brother   . Sleep apnea Son   . Sleep apnea Brother    Allergies  Allergen Reactions  . Penicillins Other (See Comments)    Has patient had a PCN reaction causing immediate rash, facial/tongue/throat swelling, SOB or lightheadedness with hypotension: Unknown Has patient had a PCN reaction causing severe rash involving mucus membranes or skin necrosis: Unknown Has patient had a PCN reaction that required hospitalization: Unknown Has patient had a PCN reaction occurring within the last 10 years: No If all of the above answers are "NO", then may proceed with Cephalosporin use.    Current Outpatient Medications on File Prior to Visit  Medication Sig Dispense Refill  . cetirizine (ZYRTEC) 10 MG tablet Take 10 mg by mouth daily as needed for allergies or rhinitis.     Marland Kitchen glipiZIDE (GLUCOTROL XL) 10 MG 24 hr tablet Take 1 tablet (10 mg total) by mouth daily with breakfast. 90 tablet 3  . hydrochlorothiazide (HYDRODIURIL) 25 MG  tablet Take 1 tablet (25 mg total) by mouth daily. 90 tablet 3  . imipramine (TOFRANIL) 50 MG tablet Take 1 tablet (50 mg total) by mouth at bedtime. 90 tablet 3  . levothyroxine (SYNTHROID, LEVOTHROID) 88 MCG tablet Take 1 tablet (88 mcg total) by mouth daily. 90 tablet 3  . lisinopril (PRINIVIL,ZESTRIL) 20 MG tablet TAKE 1 TABLET BY MOUTH ONCE DAILY (Patient taking differently: Take 20 mg by mouth daily. ) 90 tablet 1  . metFORMIN (GLUCOPHAGE-XR) 750 MG 24 hr tablet Take 1 tablet (750 mg total) by mouth daily with breakfast. 90 tablet 3  . methylphenidate (RITALIN) 20 MG tablet Take 1 tablet (20 mg total) by mouth 3 (three) times daily with meals. 180 tablet 0  . polyethylene glycol (MIRALAX / GLYCOLAX) packet Take 17 g by mouth daily as needed. 14 each 0  . simvastatin (ZOCOR) 80 MG tablet Take 1 tablet (80 mg total) by mouth at bedtime. 90 tablet 3  . sitaGLIPtin (JANUVIA) 100 MG tablet Take 1 tablet (100 mg total) by mouth daily. 90 tablet 0   No current facility-administered medications on file prior to visit.      Review of Systems  Constitutional: Positive for fatigue. Negative for activity change, appetite change, fever and unexpected weight change.       Fatigued with decreased exercise tolerance   HENT: Negative for congestion, ear pain, rhinorrhea, sinus pressure and sore throat.   Eyes: Negative for pain, redness and visual disturbance.  Respiratory: Negative for cough, shortness of breath and wheezing.   Cardiovascular: Negative for chest pain and palpitations.  Gastrointestinal: Negative for abdominal distention, abdominal pain, anal bleeding, blood in stool, constipation, diarrhea, nausea, rectal pain and vomiting.  Endocrine: Negative for polydipsia and polyuria.  Genitourinary: Negative for dysuria, frequency and urgency.  Musculoskeletal: Negative for arthralgias, back pain and myalgias.  Skin: Negative for pallor and rash.  Allergic/Immunologic: Negative for environmental  allergies.  Neurological: Negative for dizziness, syncope and headaches.  Hematological: Negative for adenopathy. Does not bruise/bleed easily.  Psychiatric/Behavioral: Negative for decreased concentration and dysphoric mood. The patient is not nervous/anxious.        Objective:   Physical Exam Constitutional:      General:  She is not in acute distress.    Appearance: She is obese. She is not ill-appearing, toxic-appearing or diaphoretic.  HENT:     Head: Normocephalic and atraumatic.     Nose: Nose normal.     Mouth/Throat:     Mouth: Mucous membranes are moist.     Pharynx: No posterior oropharyngeal erythema.  Eyes:     General: No scleral icterus.       Right eye: No discharge.        Left eye: No discharge.     Extraocular Movements: Extraocular movements intact.     Conjunctiva/sclera: Conjunctivae normal.     Pupils: Pupils are equal, round, and reactive to light.  Neck:     Musculoskeletal: Normal range of motion.  Cardiovascular:     Rate and Rhythm: Regular rhythm. Tachycardia present.     Pulses: Normal pulses.     Heart sounds: Normal heart sounds.  Pulmonary:     Effort: No respiratory distress.     Breath sounds: Normal breath sounds. No stridor. No wheezing, rhonchi or rales.  Chest:     Chest wall: No tenderness.  Abdominal:     General: Abdomen is flat. Bowel sounds are normal. There is no distension.     Tenderness: There is no abdominal tenderness. There is no guarding.  Musculoskeletal:     Right lower leg: No edema.     Left lower leg: No edema.  Lymphadenopathy:     Cervical: No cervical adenopathy.  Skin:    General: Skin is warm and dry.     Capillary Refill: Capillary refill takes less than 2 seconds.     Coloration: Skin is not pale.     Findings: No erythema or rash.  Neurological:     General: No focal deficit present.     Mental Status: She is alert.     Coordination: Coordination normal.     Deep Tendon Reflexes: Reflexes normal.    Psychiatric:        Mood and Affect: Mood normal.           Assessment & Plan:   Problem List Items Addressed This Visit      Cardiovascular and Mediastinum   Essential hypertension    bp is stable s/p hospital d/c for mesenteric ischemia/colitis BP: 132/60  On change in tx       Relevant Orders   CBC with Differential/Platelet   Comprehensive metabolic panel     Digestive   Ischemic colitis (HCC) - Primary    Disc implications of this and rev colonoscopy  Reviewed hospital records, lab results and studies in detail   For GI f.u next month No bleeding Much clinical imp  Is adv diet slowly  inst to alert us if any return of pain  Lab at f/u planned  Disc imp of bp and cholesterol and glucose control         Endocrine   Diabetes type 2, uncontrolled (HCC)    Lab Results  Component Value Date   HGBA1C 8.0 (H) 03/09/2018   Aware- expect improvement at next lab  Now eating better Fairly stable during hospitalization for colitis Consider endo consult if not improving at next visit       Relevant Orders   Hemoglobin A1c     Other   Hyperlipidemia LDL goal <100    Disc goals for lipids and reasons to control them Rev last labs with pt Rev low sat fat diet in  detail  LDL at 94 with simvastatin (top dose) - goal will be 70  ? Consider change of statin in the future Disc imp of tight control in light of mesenteric vasc dz/ ischemic colitis Diet is improved- re check at f/u      Relevant Orders   Lipid panel

## 2018-06-01 NOTE — Patient Instructions (Signed)
Gradually get back to regular activity -walking longer and longer distances to get stronger  Advance diet slowly to low cholesterol and low sugar diet (diabetic)  Follow up with me next month as planned  Follow up with GI as planned

## 2018-06-02 NOTE — Assessment & Plan Note (Signed)
Disc implications of this and rev colonoscopy  Reviewed hospital records, lab results and studies in detail   For GI f.u next month No bleeding Much clinical imp  Is adv diet slowly  inst to alert us if any return of pain  Lab at f/u planned  Disc imp of bp and cholesterol and glucose control

## 2018-06-02 NOTE — Assessment & Plan Note (Signed)
Disc goals for lipids and reasons to control them Rev last labs with pt Rev low sat fat diet in detail  LDL at 94 with simvastatin (top dose) - goal will be 70  ? Consider change of statin in the future Disc imp of tight control in light of mesenteric vasc dz/ ischemic colitis Diet is improved- re check at f/u

## 2018-06-02 NOTE — Assessment & Plan Note (Signed)
Lab Results  Component Value Date   HGBA1C 8.0 (H) 03/09/2018   Aware- expect improvement at next lab  Now eating better Fairly stable during hospitalization for colitis Consider endo consult if not improving at next visit

## 2018-06-02 NOTE — Assessment & Plan Note (Signed)
bp is stable s/p hospital d/c for mesenteric ischemia/colitis BP: 132/60  On change in tx

## 2018-06-24 ENCOUNTER — Telehealth: Payer: Self-pay | Admitting: Gastroenterology

## 2018-06-24 ENCOUNTER — Other Ambulatory Visit (INDEPENDENT_AMBULATORY_CARE_PROVIDER_SITE_OTHER): Payer: Medicare Other

## 2018-06-24 DIAGNOSIS — E1165 Type 2 diabetes mellitus with hyperglycemia: Secondary | ICD-10-CM

## 2018-06-24 DIAGNOSIS — E785 Hyperlipidemia, unspecified: Secondary | ICD-10-CM

## 2018-06-24 DIAGNOSIS — I10 Essential (primary) hypertension: Secondary | ICD-10-CM

## 2018-06-24 LAB — LIPID PANEL
Cholesterol: 181 mg/dL (ref 0–200)
HDL: 38.2 mg/dL — AB (ref >39.00)
NonHDL: 143.23
Total CHOL/HDL Ratio: 5
Triglycerides: 370 mg/dL — ABNORMAL HIGH (ref 0.0–149.0)
VLDL: 74 mg/dL — ABNORMAL HIGH (ref 0.0–40.0)

## 2018-06-24 LAB — CBC WITH DIFFERENTIAL/PLATELET
Basophils Absolute: 0 10*3/uL (ref 0.0–0.1)
Basophils Relative: 0.4 % (ref 0.0–3.0)
Eosinophils Absolute: 0.1 10*3/uL (ref 0.0–0.7)
Eosinophils Relative: 1 % (ref 0.0–5.0)
HCT: 37.1 % (ref 36.0–46.0)
Hemoglobin: 12.4 g/dL (ref 12.0–15.0)
Lymphocytes Relative: 19.4 % (ref 12.0–46.0)
Lymphs Abs: 2.2 10*3/uL (ref 0.7–4.0)
MCHC: 33.5 g/dL (ref 30.0–36.0)
MCV: 88.5 fl (ref 78.0–100.0)
Monocytes Absolute: 0.7 10*3/uL (ref 0.1–1.0)
Monocytes Relative: 5.9 % (ref 3.0–12.0)
Neutro Abs: 8.3 10*3/uL — ABNORMAL HIGH (ref 1.4–7.7)
Neutrophils Relative %: 73.3 % (ref 43.0–77.0)
Platelets: 286 10*3/uL (ref 150.0–400.0)
RBC: 4.19 Mil/uL (ref 3.87–5.11)
RDW: 13.8 % (ref 11.5–15.5)
WBC: 11.3 10*3/uL — ABNORMAL HIGH (ref 4.0–10.5)

## 2018-06-24 LAB — COMPREHENSIVE METABOLIC PANEL
ALK PHOS: 100 U/L (ref 39–117)
ALT: 20 U/L (ref 0–35)
AST: 14 U/L (ref 0–37)
Albumin: 4.3 g/dL (ref 3.5–5.2)
BUN: 22 mg/dL (ref 6–23)
CO2: 29 mEq/L (ref 19–32)
Calcium: 9 mg/dL (ref 8.4–10.5)
Chloride: 96 mEq/L (ref 96–112)
Creatinine, Ser: 1.08 mg/dL (ref 0.40–1.20)
GFR: 52.06 mL/min — ABNORMAL LOW (ref >60.00)
Glucose, Bld: 234 mg/dL — ABNORMAL HIGH (ref 70–99)
Potassium: 3.8 mEq/L (ref 3.5–5.1)
Sodium: 134 mEq/L — ABNORMAL LOW (ref 135–145)
TOTAL PROTEIN: 7.2 g/dL (ref 6.0–8.3)
Total Bilirubin: 0.4 mg/dL (ref 0.2–1.2)

## 2018-06-24 LAB — LDL CHOLESTEROL, DIRECT: Direct LDL: 86 mg/dL

## 2018-06-24 LAB — HEMOGLOBIN A1C: Hgb A1c MFr Bld: 8.9 % — ABNORMAL HIGH (ref 4.6–6.5)

## 2018-06-24 NOTE — Telephone Encounter (Signed)
Thanks for the update.  Passage of mucus per rectum is not typically from anything harmful.  Bowel function often disturbed from acute illness and antibiotics like she had.  If she is having loose stool, then please arrange C difficile PCR test.  If not, then OTC probiotic such as Align once daily for 10-14 days and monitor.  Call back as needed.  - HD

## 2018-06-24 NOTE — Telephone Encounter (Signed)
Spoke with pt and she is aware.

## 2018-06-24 NOTE — Telephone Encounter (Signed)
Pt was seen in the hospital for ischemic colitis. Had colon with Dr. Meridee Score on 12/16 and was placed on 7 days of cipro and flagyl. Pt is home now and reports that she is passing a lot of clear mucous. States she has urgency and has to wear a panty liner for the "goop" that comes out and she cannot control it. States yesterday she had this happen twice. Pt is concerned and wants to know what she should do for this. Please advise.

## 2018-06-28 ENCOUNTER — Other Ambulatory Visit: Payer: Medicare Other

## 2018-06-30 ENCOUNTER — Ambulatory Visit (INDEPENDENT_AMBULATORY_CARE_PROVIDER_SITE_OTHER): Payer: Medicare Other | Admitting: Family Medicine

## 2018-06-30 ENCOUNTER — Encounter: Payer: Self-pay | Admitting: Family Medicine

## 2018-06-30 VITALS — BP 135/70 | HR 107 | Temp 98.1°F | Ht 61.5 in | Wt 158.2 lb

## 2018-06-30 DIAGNOSIS — K559 Vascular disorder of intestine, unspecified: Secondary | ICD-10-CM

## 2018-06-30 DIAGNOSIS — E785 Hyperlipidemia, unspecified: Secondary | ICD-10-CM | POA: Diagnosis not present

## 2018-06-30 DIAGNOSIS — I10 Essential (primary) hypertension: Secondary | ICD-10-CM

## 2018-06-30 DIAGNOSIS — E1165 Type 2 diabetes mellitus with hyperglycemia: Secondary | ICD-10-CM | POA: Diagnosis not present

## 2018-06-30 NOTE — Progress Notes (Signed)
Subjective:    Patient ID: Amanda King, female    DOB: 1940/10/04, 78 y.o.   MRN: [id number]  HPI Here for f/u of chronic medical problems   Doing better overall - GI wise  Now taking probiotic -improved some   Wt Readings from Last 3 Encounters:  06/30/18 158 lb 4 oz (71.8 kg)  06/01/18 156 lb 12 oz (71.1 kg)  05/24/18 161 lb 2.5 oz (73.1 kg)  not eating as much but can tolerate more  Is walking  29.42 kg/m   bp is stable today  No cp or palpitations or headaches or edema  No side effects to medicines  BP Readings from Last 3 Encounters:  06/30/18 (!) 142/68  06/01/18 132/60  05/24/18 (!) 134/39    After sitting BP: 135/70  Improved    Pulse Readings from Last 3 Encounters:  06/30/18 (!) 107  06/01/18 (!) 108  05/24/18 78  unsure why pulse rate is up   DM2 A1C last visit was 8.0 Decided to start eating better  Glucose levels often in 170s to 180s Takes glipizide and metformin xr and [place]  Lab Results  Component Value Date   HGBA1C 8.9 (H) 06/24/2018  very compliant with med and diet Walking also  This is higher unfortunately Eye exam  She has been drinking OJ -will stop it    Lab Results  Component Value Date   CREATININE 1.08 06/24/2018   BUN 22 06/24/2018   NA 134 (L) 06/24/2018   K 3.8 06/24/2018   CL 96 06/24/2018   CO2 29 06/24/2018   Lab Results  Component Value Date   ALT 20 06/24/2018   AST 14 06/24/2018   ALKPHOS 100 06/24/2018   BILITOT 0.4 06/24/2018   Lab Results  Component Value Date   WBC 11.3 (H) 06/24/2018   HGB 12.4 06/24/2018   HCT 37.1 06/24/2018   MCV 88.5 06/24/2018   PLT 286.0 06/24/2018   Lab Results  Component Value Date   TSH 1.34 03/09/2018     Hyperlipidemia Lab Results  Component Value Date   CHOL 181 06/24/2018   CHOL 188 03/09/2018   CHOL 199 08/18/2017   Lab Results  Component Value Date   HDL 38.20 (L) 06/24/2018   HDL 39.40 03/09/2018   HDL 42.00 08/18/2017   Lab Results  Component  Value Date   LDLCALC 73 06/21/2014   LDLCALC 49 09/14/2013   LDLCALC 51 12/31/2011   Lab Results  Component Value Date   TRIG 370.0 (H) 06/24/2018   TRIG 357.0 (H) 03/09/2018   TRIG 366.0 (H) 08/18/2017   Lab Results  Component Value Date   CHOLHDL 5 06/24/2018   CHOLHDL 5 03/09/2018   CHOLHDL 5 08/18/2017   Lab Results  Component Value Date   LDLDIRECT 86.0 06/24/2018   LDLDIRECT 94.0 03/09/2018   LDLDIRECT 87.0 08/18/2017   Taking simvastatin  LDL is controlled  Trig are up (so is glucose)   Other labs Results for orders placed or performed in visit on 06/24/18  Lipid panel  Result Value Ref Range   Cholesterol 181 0 - 200 mg/dL   Triglycerides 941.7 (H) 0.0 - 149.0 mg/dL   HDL 40.81 (L) >44.81 mg/dL   VLDL 85.6 (H) 0.0 - 31.4 mg/dL   Total CHOL/HDL Ratio 5    NonHDL 143.23   Hemoglobin A1c  Result Value Ref Range   Hgb A1c MFr Bld 8.9 (H) 4.6 - 6.5 %  Comprehensive metabolic panel  Result Value Ref Range   Sodium 134 (L) 135 - 145 mEq/L   Potassium 3.8 3.5 - 5.1 mEq/L   Chloride 96 96 - 112 mEq/L   CO2 29 19 - 32 mEq/L   Glucose, Bld 234 (H) 70 - 99 mg/dL   BUN 22 6 - 23 mg/dL   Creatinine, Ser 1.611.08 0.40 - 1.20 mg/dL   Total Bilirubin 0.4 0.2 - 1.2 mg/dL   Alkaline Phosphatase 100 39 - 117 U/L   AST 14 0 - 37 U/L   ALT 20 0 - 35 U/L   Total Protein 7.2 6.0 - 8.3 g/dL   Albumin 4.3 3.5 - 5.2 g/dL   Calcium 9.0 8.4 - 09.610.5 mg/dL   GFR 04.5452.06 (L) >09.81>60.00 mL/min  CBC with Differential/Platelet  Result Value Ref Range   WBC 11.3 (H) 4.0 - 10.5 K/uL   RBC 4.19 3.87 - 5.11 Mil/uL   Hemoglobin 12.4 12.0 - 15.0 g/dL   HCT 19.137.1 47.836.0 - 29.546.0 %   MCV 88.5 78.0 - 100.0 fl   MCHC 33.5 30.0 - 36.0 g/dL   RDW 62.113.8 30.811.5 - 65.715.5 %   Platelets 286.0 150.0 - 400.0 K/uL   Neutrophils Relative % 73.3 43.0 - 77.0 %   Lymphocytes Relative 19.4 12.0 - 46.0 %   Monocytes Relative 5.9 3.0 - 12.0 %   Eosinophils Relative 1.0 0.0 - 5.0 %   Basophils Relative 0.4 0.0 - 3.0 %    Neutro Abs 8.3 (H) 1.4 - 7.7 K/uL   Lymphs Abs 2.2 0.7 - 4.0 K/uL   Monocytes Absolute 0.7 0.1 - 1.0 K/uL   Eosinophils Absolute 0.1 0.0 - 0.7 K/uL   Basophils Absolute 0.0 0.0 - 0.1 K/uL  LDL cholesterol, direct  Result Value Ref Range   Direct LDL 86.0 mg/dL    Patient Active Problem List   Diagnosis Date Noted  . Ischemic colitis (HCC)   . Ileus (HCC)   . Constipation   . Colitis 05/18/2018  . Colitis presumed infectious 05/17/2018  . Screening mammogram, encounter for 03/30/2018  . Routine general medical examination at a health care facility 01/31/2016  . Narcolepsy with cataplexy 10/31/2014  . Stress reaction 03/28/2014  . Encounter for Medicare annual wellness exam 09/22/2013  . Hypersomnia, persistent 07/15/2013  . Colon cancer screening 12/31/2011  . Obesity (BMI 30-39.9) 07/01/2011  . ANXIETY 05/06/2010  . DERMATOPHYTOSIS OF NAIL 12/21/2008  . Hypothyroidism 09/02/2006  . Diabetes type 2, uncontrolled (HCC) 09/02/2006  . Hyperlipidemia LDL goal <100 09/02/2006  . NARCOLEPSY W/CATAPLEXY 09/02/2006  . Essential hypertension 09/02/2006  . PNEUMONIA, HX OF 09/02/2006   Past Medical History:  Diagnosis Date  . Diabetes mellitus    Type II  . Hyperlipidemia   . Hypertension   . Hypothyroidism   . Migraine   . Narcolepsy with cataplexy   . Pneumonia 08/2001   Past Surgical History:  Procedure Laterality Date  . APPENDECTOMY  age 89  . BIOPSY  05/24/2018   Procedure: BIOPSY;  Surgeon: Meridee ScoreMansouraty, Netty StarringGabriel Jr., MD;  [facility];  Service: Gastroenterology;;  . COLONOSCOPY N/A 05/24/2018   Procedure: COLONOSCOPY;  Surgeon: Lemar LoftyMansouraty, Gabriel Jr., MD;  [facility];  Service: Gastroenterology;  Laterality: N/A;  . hemilaminectomy     C5-6; C6-7  . PARTIAL HYSTERECTOMY  age 78  . POSTERIOR CERVICAL FUSION/FORAMINOTOMY  02/05/1975   C5-C6-C7   Social History   Tobacco Use  . Smoking status: Former Smoker    Last attempt  to quit: 06/09/1980     Years since quitting: 38.0  . Smokeless tobacco: Never Used  Substance Use Topics  . Alcohol use: No    Alcohol/week: 0.0 standard drinks  . Drug use: No   Family History  Problem Relation Age of Onset  . Depression Brother        suicide  . Diabetes Brother   . Heart disease Mother        MI  . Peripheral vascular disease Mother   . Hyperlipidemia Mother   . Diabetes Brother   . Sleep apnea Son   . Sleep apnea Brother    Allergies  Allergen Reactions  . Penicillins Other (See Comments)    Has patient had a PCN reaction causing immediate rash, facial/tongue/throat swelling, SOB or lightheadedness with hypotension: Unknown Has patient had a PCN reaction causing severe rash involving mucus membranes or skin necrosis: Unknown Has patient had a PCN reaction that required hospitalization: Unknown Has patient had a PCN reaction occurring within the last 10 years: No If all of the above answers are "NO", then may proceed with Cephalosporin use.    Current Outpatient Medications on File Prior to Visit  Medication Sig Dispense Refill  . cetirizine (ZYRTEC) 10 MG tablet Take 10 mg by mouth daily as needed for allergies or rhinitis.     Marland Kitchen glipiZIDE (GLUCOTROL XL) 10 MG 24 hr tablet Take 1 tablet (10 mg total) by mouth daily with breakfast. 90 tablet 3  . hydrochlorothiazide (HYDRODIURIL) 25 MG tablet Take 1 tablet (25 mg total) by mouth daily. 90 tablet 3  . imipramine (TOFRANIL) 50 MG tablet Take 1 tablet (50 mg total) by mouth at bedtime. 90 tablet 3  . levothyroxine (SYNTHROID, LEVOTHROID) 88 MCG tablet Take 1 tablet (88 mcg total) by mouth daily. 90 tablet 3  . lisinopril (PRINIVIL,ZESTRIL) 20 MG tablet TAKE 1 TABLET BY MOUTH ONCE DAILY (Patient taking differently: Take 20 mg by mouth daily. ) 90 tablet 1  . metFORMIN (GLUCOPHAGE-XR) 750 MG 24 hr tablet Take 1 tablet (750 mg total) by mouth daily with breakfast. 90 tablet 3  . methylphenidate (RITALIN) 20 MG tablet Take 1 tablet (20  mg total) by mouth 3 (three) times daily with meals. 180 tablet 0  . polyethylene glycol (MIRALAX / GLYCOLAX) packet Take 17 g by mouth daily as needed. 14 each 0  . simvastatin (ZOCOR) 80 MG tablet Take 1 tablet (80 mg total) by mouth at bedtime. 90 tablet 3  . sitaGLIPtin (JANUVIA) 100 MG tablet Take 1 tablet (100 mg total) by mouth daily. 90 tablet 0   No current facility-administered medications on file prior to visit.     Review of Systems  Constitutional: Negative for activity change, appetite change, fatigue, fever and unexpected weight change.  HENT: Negative for congestion, ear pain, rhinorrhea, sinus pressure and sore throat.   Eyes: Negative for pain, redness and visual disturbance.  Respiratory: Negative for cough, shortness of breath and wheezing.   Cardiovascular: Negative for chest pain and palpitations.  Gastrointestinal: Negative for abdominal distention, abdominal pain, anal bleeding, blood in stool, constipation, diarrhea, nausea and rectal pain.       Occ mucous in stools Recent colitis-is gradually adv diet   Endocrine: Negative for polydipsia and polyuria.  Genitourinary: Negative for dysuria, frequency and urgency.  Musculoskeletal: Negative for arthralgias, back pain and myalgias.  Skin: Negative for pallor and rash.  Allergic/Immunologic: Negative for environmental allergies.  Neurological: Negative for dizziness, syncope and headaches.  Hematological: Negative for adenopathy. Does not bruise/bleed easily.  Psychiatric/Behavioral: Negative for decreased concentration and dysphoric mood. The patient is not nervous/anxious.        Objective:   Physical Exam Constitutional:      General: She is not in acute distress.    Appearance: Normal appearance. She is well-developed. She is obese. She is not ill-appearing.  HENT:     Head: Normocephalic and atraumatic.     Mouth/Throat:     Mouth: Mucous membranes are moist.     Pharynx: Oropharynx is clear.  Eyes:      General: No scleral icterus.    Conjunctiva/sclera: Conjunctivae normal.     Pupils: Pupils are equal, round, and reactive to light.  Neck:     Musculoskeletal: Normal range of motion and neck supple.     Thyroid: No thyromegaly.     Vascular: No carotid bruit or JVD.  Cardiovascular:     Rate and Rhythm: Regular rhythm. Tachycardia present.     Pulses: Normal pulses.     Heart sounds: Normal heart sounds. No gallop.   Pulmonary:     Effort: Pulmonary effort is normal. No respiratory distress.     Breath sounds: Normal breath sounds. No stridor. No wheezing, rhonchi or rales.  Abdominal:     General: Bowel sounds are normal. There is no distension or abdominal bruit.     Palpations: Abdomen is soft. There is no mass.     Tenderness: There is no abdominal tenderness. There is no guarding or rebound.     Comments: Protuberant abdomen - tympanic  Not tender  Nl bs   Musculoskeletal:     Right lower leg: No edema.     Left lower leg: No edema.  Lymphadenopathy:     Cervical: No cervical adenopathy.  Skin:    General: Skin is warm and dry.     Coloration: Skin is not jaundiced.     Findings: No bruising or rash.  Neurological:     General: No focal deficit present.     Mental Status: She is alert.     Deep Tendon Reflexes: Reflexes are normal and symmetric.  Psychiatric:        Mood and Affect: Mood normal.        Cognition and Memory: She exhibits impaired recent memory.     Comments: Pleasant and talkative            Assessment & Plan:   Problem List Items Addressed This Visit      Cardiovascular and Mediastinum   Essential hypertension    bp in fair control at this time  BP Readings from Last 1 Encounters:  06/30/18 135/70   No changes needed Most recent labs reviewed  Disc lifstyle change with low sodium diet and exercise          Digestive   Ischemic colitis (HCC)    Much clinical improvement  Gradually advancing diet  Seeing GI Taking probiotic-has  helped stools         Endocrine   Diabetes type 2, uncontrolled (HCC) - Primary    Lab Results  Component Value Date   HGBA1C 8.9 (H) 06/24/2018   Despite better diet and exercise this is going up  Metformin/glipizide/ januvia No hypoglycemia Rev diet-will eliminate juice utd foot and eye care Ref to endocrinology for difficult to control DM2       Relevant Orders   Ambulatory referral to Endocrinology     Other  Hyperlipidemia LDL goal <100    Disc goals for lipids and reasons to control them Rev last labs with pt Rev low sat fat diet in detail  Taking simvastatin  LDL controlled  Trig up -may be from diabetes as well

## 2018-06-30 NOTE — Assessment & Plan Note (Signed)
Disc goals for lipids and reasons to control them Rev last labs with pt Rev low sat fat diet in detail  Taking simvastatin  LDL controlled  Trig up -may be from diabetes as well

## 2018-06-30 NOTE — Assessment & Plan Note (Signed)
Lab Results  Component Value Date   HGBA1C 8.9 (H) 06/24/2018   Despite better diet and exercise this is going up  Metformin/glipizide/ januvia No hypoglycemia Rev diet-will eliminate juice utd foot and eye care Ref to endocrinology for difficult to control DM2

## 2018-06-30 NOTE — Assessment & Plan Note (Signed)
Much clinical improvement  Gradually advancing diet  Seeing GI Taking probiotic-has helped stools

## 2018-06-30 NOTE — Assessment & Plan Note (Signed)
bp in fair control at this time  BP Readings from Last 1 Encounters:  06/30/18 135/70   No changes needed Most recent labs reviewed  Disc lifstyle change with low sodium diet and exercise

## 2018-06-30 NOTE — Patient Instructions (Addendum)
Stop drinking juice  We will refer you to endocrinology for help with your diabetes   Our office will call you about that   Keep walking and eating healthy  Take care of yourself   Keep watching your blood glucose levels - do some in am and some in the pm

## 2018-07-08 ENCOUNTER — Ambulatory Visit: Payer: Medicare Other | Admitting: Gastroenterology

## 2018-07-08 ENCOUNTER — Encounter: Payer: Self-pay | Admitting: Gastroenterology

## 2018-07-08 VITALS — BP 142/84 | HR 76 | Ht 62.5 in | Wt 161.5 lb

## 2018-07-08 DIAGNOSIS — K5901 Slow transit constipation: Secondary | ICD-10-CM | POA: Diagnosis not present

## 2018-07-08 DIAGNOSIS — K559 Vascular disorder of intestine, unspecified: Secondary | ICD-10-CM

## 2018-07-08 NOTE — Patient Instructions (Signed)
If you are age 79 or older, your body mass index should be between 23-30. Your Body mass index is 29.07 kg/m. If this is out of the aforementioned range listed, please consider follow up with your Primary Care Provider.  If you are age 50 or younger, your body mass index should be between 19-25. Your Body mass index is 29.07 kg/m. If this is out of the aformentioned range listed, please consider follow up with your Primary Care Provider.   Follow up as needed.   It was a pleasure to see you today!  Dr. Myrtie Neither

## 2018-07-08 NOTE — Progress Notes (Signed)
Amanda King  Chief Complaint: Ischemic colitis  Subjective  History:  Amanda DroneBrenda follows up with me after recent hospitalization, when she was admitted for abdominal pain and sepsis-like picture.  CT scan showed wall thickening and inflammation around the splenic flexure and descending colon with more proximal dilatation.  She improved over several days with bowel regimen, then colonoscopy by Dr. Meridee ScoreMansouraty revealed ischemic colitis.  She was discharged home on a course of oral antibiotics.  She is a former smoker, medical problems described as below, and did not have any precipitating event such as hypotension or known vasoconstrictive medicines prior to the recent admission.   She called on January 16 complaining of passing copious mucus per rectum, and I recommended a probiotic.  She has been taking that and the mucus has resolved.  Her bowel habits are regular, but if she does not have a BM every day, she will take a small dose of MiraLAX.  ROS: Cardiovascular:  no chest pain Respiratory: no dyspnea  The patient's Past Medical, Family and Social History were reviewed and are on file in the EMR. Most notable medical conditions are diabetes on oral medicines, hypothyroidism and hypertension. Objective:  Med list reviewed  Current Outpatient Medications:  .  cetirizine (ZYRTEC) 10 MG tablet, Take 10 mg by mouth daily as needed for allergies or rhinitis. , Disp: , Rfl:  .  glipiZIDE (GLUCOTROL XL) 10 MG 24 hr tablet, Take 1 tablet (10 mg total) by mouth daily with breakfast., Disp: 90 tablet, Rfl: 3 .  hydrochlorothiazide (HYDRODIURIL) 25 MG tablet, Take 1 tablet (25 mg total) by mouth daily., Disp: 90 tablet, Rfl: 3 .  imipramine (TOFRANIL) 50 MG tablet, Take 1 tablet (50 mg total) by mouth at bedtime., Disp: 90 tablet, Rfl: 3 .  levothyroxine (SYNTHROID, LEVOTHROID) 88 MCG tablet, Take 1 tablet (88 mcg total) by mouth daily., Disp: 90 tablet, Rfl: 3 .  lisinopril  (PRINIVIL,ZESTRIL) 20 MG tablet, TAKE 1 TABLET BY MOUTH ONCE DAILY (Patient taking differently: Take 20 mg by mouth daily. ), Disp: 90 tablet, Rfl: 1 .  metFORMIN (GLUCOPHAGE-XR) 750 MG 24 hr tablet, Take 1 tablet (750 mg total) by mouth daily with breakfast., Disp: 90 tablet, Rfl: 3 .  methylphenidate (RITALIN) 20 MG tablet, Take 1 tablet (20 mg total) by mouth 3 (three) times daily with meals., Disp: 180 tablet, Rfl: 0 .  polyethylene glycol (MIRALAX / GLYCOLAX) packet, Take 17 g by mouth daily as needed., Disp: 14 each, Rfl: 0 .  Probiotic Product (ALIGN) 4 MG CAPS, Take by mouth daily., Disp: , Rfl:  .  simvastatin (ZOCOR) 80 MG tablet, Take 1 tablet (80 mg total) by mouth at bedtime., Disp: 90 tablet, Rfl: 3 .  sitaGLIPtin (JANUVIA) 100 MG tablet, Take 1 tablet (100 mg total) by mouth daily., Disp: 90 tablet, Rfl: 0   Vital signs in last 24 hrs: Vitals:   07/08/18 1329  BP: (!) 142/84  Pulse: 76    Physical Exam  She is well-appearing  HEENT: sclera anicteric, oral mucosa moist without lesions  Neck: supple, no thyromegaly, JVD or lymphadenopathy  Cardiac: RRR without murmurs, S1S2 heard, no peripheral edema  Pulm: clear to auscultation bilaterally, normal RR and effort noted  Abdomen: soft, no tenderness, with active bowel sounds. No guarding or palpable hepatosplenomegaly.  Skin; warm and dry, no jaundice or rash  Recent Labs:  Terminal ileal biopsies normal.  Random right and left colon biopsies normal. Biopsies from  the ischemic area were not obtained.  I reviewed Dr. Elesa Hacker colonoscopy report, and spoke with him about it after he performed the procedure.  @ASSESSMENTPLANBEGIN @ Assessment: Encounter Diagnoses  Name Primary?  . Ischemic colitis (HCC) Yes  . Slow transit constipation    This was her first episode of ischemic colitis, and she has now completely recovered. There was some intermittent constipation in the past, which have been a contributing  factor in this episode.   Plan: She no longer needs the probiotic. Continue as needed MiraLAX dosing to avoid constipation. See me as needed   Total time 15 minutes, over half spent face-to-face with patient in counseling and coordination of care.   Charlie Pitter III

## 2018-07-19 ENCOUNTER — Encounter: Payer: Self-pay | Admitting: Endocrinology

## 2018-07-19 ENCOUNTER — Other Ambulatory Visit: Payer: Self-pay

## 2018-07-19 ENCOUNTER — Ambulatory Visit: Payer: Medicare Other | Admitting: Endocrinology

## 2018-07-19 VITALS — BP 170/80 | HR 104 | Temp 98.6°F | Ht 62.5 in | Wt 164.0 lb

## 2018-07-19 DIAGNOSIS — I10 Essential (primary) hypertension: Secondary | ICD-10-CM

## 2018-07-19 DIAGNOSIS — E1165 Type 2 diabetes mellitus with hyperglycemia: Secondary | ICD-10-CM

## 2018-07-19 MED ORDER — ONETOUCH ULTRASOFT LANCETS MISC: 3 refills | Status: DC

## 2018-07-19 MED ORDER — EMPAGLIFLOZIN 10 MG PO TABS: 10.0000 mg | ORAL_TABLET | Freq: Every day | ORAL | 3 refills | Status: DC

## 2018-07-19 MED ORDER — GLUCOSE BLOOD VI STRP: ORAL_STRIP | 3 refills | Status: DC

## 2018-07-19 NOTE — Patient Instructions (Signed)
Check blood sugars on waking up 3 days a week  Also check blood sugars about 2 hours after meals and do this after different meals by rotation  Recommended blood sugar levels on waking up are 90-130 and about 2 hours after meal is 130-180  Please bring your blood sugar monitor to each visit, thank you  NEW medication: Jardiance 10 mg will be taken in the morning at breakfast time  When starting this increase fluid intake  Also REDUCE hydrochlorothiazide to half tablet  If your blood sugar starts going below 100 let us know we will need to cut down your glipizide to a half a pill Continue Januvia and metformin for now  Start walking regularly

## 2018-07-19 NOTE — Progress Notes (Signed)
Patient ID: Amanda King, female   DOB: [birth year], 78 y.o.   MRN: [id number]           Reason for Appointment: Consultation for Type 2 Diabetes  Referring PCP: Dr. Milinda Antis   History of Present Illness:          Date of diagnosis of type 2 diabetes mellitus:  2008      Background history:  She was initially treated with metformin and subsequently glipizide added Januvia was added in 12/2016 She has had variable control with A1c as high as 10.3 done in 2015 and as low as 7.3 done in April 2016 Has generally been followed by her PCP about twice a year  Recent history:   Most recent A1c is 8.9 done on 06/24/2018      Non-insulin hypoglycemic drugs the patient is taking are: Januvia 100 mg daily, glipizide ER 10 mg daily, metformin ER 750 mg daily  Current management, blood sugar patterns and problems identified:  She has been referred here for progressively increasing A1c  Her medications have not been changed 12/2016  She is using a generic monitor to check her blood sugar and does not keep a record of this  Although she has previously done better with regular exercise and consistent diet she has not been very compliant with this and has not been very motivated  Her blood sugars have been checked mostly in the morning and either before or after lunch readings below are approximate  She has gained weight recently          Side effects from medications have been: Diarrhea from high dose metformin      Typical meal intake: Breakfast is Oatmeal, toast                Exercise:  none  Glucose monitoring:  done less than 1 times a day         Glucometer: Generic      Blood Glucose readings by time of day and averages from meter download:  PREMEAL Breakfast Lunch Dinner Bedtime  Overall   Glucose range: 160-180  170    Median:        POST-MEAL PC Breakfast PC Lunch PC Dinner  Glucose range: 200 180-213   Median:       Dietician visit, most recent: 2008  Weight  history:  Wt Readings from Last 3 Encounters:  07/19/18 164 lb (74.4 kg)  07/08/18 161 lb 8 oz (73.3 kg)  06/30/18 158 lb 4 oz (71.8 kg)    Glycemic control:   Lab Results  Component Value Date   HGBA1C 8.9 (H) 06/24/2018   HGBA1C 8.0 (H) 03/09/2018   HGBA1C 7.4 (H) 08/18/2017   Lab Results  Component Value Date   MICROALBUR 0.6 12/21/2008   LDLCALC 73 06/21/2014   CREATININE 1.08 06/24/2018   Lab Results  Component Value Date   MICRALBCREAT 5.2 12/21/2008    No results found for: FRUCTOSAMINE  No visits with results within 1 Week(s) from this visit.  Latest known visit with results is:  Lab on 06/24/2018  Component Date Value Ref Range Status  . Cholesterol 06/24/2018 181  0 - 200 mg/dL Final   ATP III Classification       Desirable:  < 200 mg/dL               Borderline High:  200 - 239 mg/dL          High:  > =  240 mg/dL  . Triglycerides 06/24/2018 370.0* 0.0 - 149.0 mg/dL Final   Normal:  <604<150 mg/dLBorderline High:  150 - 199 mg/dL  . HDL 06/24/2018 38.20* >39.00 mg/dL Final  . VLDL 54/09/811901/16/2020 74.0* 0.0 - 40.0 mg/dL Final  . Total CHOL/HDL Ratio 06/24/2018 5   Final                  Men          Women1/2 Average Risk     3.4          3.3Average Risk          5.0          4.42X Average Risk          9.6          7.13X Average Risk          15.0          11.0                      . NonHDL 06/24/2018 143.23   Final   NOTE:  Non-HDL goal should be 30 mg/dL higher than patient's LDL goal (i.e. LDL goal of < 70 mg/dL, would have non-HDL goal of < 100 mg/dL)  . Hgb A1c MFr Bld 06/24/2018 8.9* 4.6 - 6.5 % Final   Glycemic Control Guidelines for People with Diabetes:Non Diabetic:  <6%Goal of Therapy: <7%Additional Action Suggested:  >8%   . Sodium 06/24/2018 134* 135 - 145 mEq/L Final  . Potassium 06/24/2018 3.8  3.5 - 5.1 mEq/L Final  . Chloride 06/24/2018 96  96 - 112 mEq/L Final  . CO2 06/24/2018 29  19 - 32 mEq/L Final  . Glucose, Bld 06/24/2018 234* 70 - 99 mg/dL  Final  . BUN 14/78/295601/16/2020 22  6 - 23 mg/dL Final  . Creatinine, Ser 06/24/2018 1.08  0.40 - 1.20 mg/dL Final  . Total Bilirubin 06/24/2018 0.4  0.2 - 1.2 mg/dL Final  . Alkaline Phosphatase 06/24/2018 100  39 - 117 U/L Final  . AST 06/24/2018 14  0 - 37 U/L Final  . ALT 06/24/2018 20  0 - 35 U/L Final  . Total Protein 06/24/2018 7.2  6.0 - 8.3 g/dL Final  . Albumin 21/30/865701/16/2020 4.3  3.5 - 5.2 g/dL Final  . Calcium 84/69/629501/16/2020 9.0  8.4 - 10.5 mg/dL Final  . GFR 28/41/324401/16/2020 52.06* >60.00 mL/min Final  . WBC 06/24/2018 11.3* 4.0 - 10.5 K/uL Final  . RBC 06/24/2018 4.19  3.87 - 5.11 Mil/uL Final  . Hemoglobin 06/24/2018 12.4  12.0 - 15.0 g/dL Final  . HCT 01/02/725301/16/2020 37.1  36.0 - 46.0 % Final  . MCV 06/24/2018 88.5  78.0 - 100.0 fl Final  . MCHC 06/24/2018 33.5  30.0 - 36.0 g/dL Final  . RDW 66/44/034701/16/2020 13.8  11.5 - 15.5 % Final  . Platelets 06/24/2018 286.0  150.0 - 400.0 K/uL Final  . Neutrophils Relative % 06/24/2018 73.3  43.0 - 77.0 % Final  . Lymphocytes Relative 06/24/2018 19.4  12.0 - 46.0 % Final  . Monocytes Relative 06/24/2018 5.9  3.0 - 12.0 % Final  . Eosinophils Relative 06/24/2018 1.0  0.0 - 5.0 % Final  . Basophils Relative 06/24/2018 0.4  0.0 - 3.0 % Final  . Neutro Abs 06/24/2018 8.3* 1.4 - 7.7 K/uL Final  . Lymphs Abs 06/24/2018 2.2  0.7 - 4.0 K/uL Final  . Monocytes Absolute 06/24/2018 0.7  0.1 - 1.0  K/uL Final  . Eosinophils Absolute 06/24/2018 0.1  0.0 - 0.7 K/uL Final  . Basophils Absolute 06/24/2018 0.0  0.0 - 0.1 K/uL Final  . Direct LDL 06/24/2018 86.0  mg/dL Final   Optimal:  <098 mg/dLNear or Above Optimal:  100-129 mg/dLBorderline High:  130-159 mg/dLHigh:  160-189 mg/dLVery High:  >190 mg/dL    Allergies as of 06/27/1476      Reactions   Penicillins Other (See Comments)   Has patient had a PCN reaction causing immediate rash, facial/tongue/throat swelling, SOB or lightheadedness with hypotension: Unknown Has patient had a PCN reaction causing severe rash  involving mucus membranes or skin necrosis: Unknown Has patient had a PCN reaction that required hospitalization: Unknown Has patient had a PCN reaction occurring within the last 10 years: No If all of the above answers are "NO", then may proceed with Cephalosporin use.      Medication List       Accurate as of July 19, 2018 12:17 PM. Always use your most recent med list.        ALIGN 4 MG Caps Take by mouth daily.   cetirizine 10 MG tablet Commonly known as:  ZYRTEC Take 10 mg by mouth daily as needed for allergies or rhinitis.   empagliflozin 10 MG Tabs tablet Commonly known as:  JARDIANCE Take 10 mg by mouth daily with breakfast.   glipiZIDE 10 MG 24 hr tablet Commonly known as:  GLUCOTROL XL Take 1 tablet (10 mg total) by mouth daily with breakfast.   hydrochlorothiazide 25 MG tablet Commonly known as:  HYDRODIURIL Take 1 tablet (25 mg total) by mouth daily.   imipramine 50 MG tablet Commonly known as:  TOFRANIL Take 1 tablet (50 mg total) by mouth at bedtime.   levothyroxine 88 MCG tablet Commonly known as:  SYNTHROID, LEVOTHROID Take 1 tablet (88 mcg total) by mouth daily.   lisinopril 20 MG tablet Commonly known as:  PRINIVIL,ZESTRIL TAKE 1 TABLET BY MOUTH ONCE DAILY   metFORMIN 750 MG 24 hr tablet Commonly known as:  GLUCOPHAGE-XR Take 1 tablet (750 mg total) by mouth daily with breakfast.   methylphenidate 20 MG tablet Commonly known as:  RITALIN Take 1 tablet (20 mg total) by mouth 3 (three) times daily with meals.   polyethylene glycol packet Commonly known as:  MIRALAX / GLYCOLAX Take 17 g by mouth daily as needed.   simvastatin 80 MG tablet Commonly known as:  ZOCOR Take 1 tablet (80 mg total) by mouth at bedtime.   sitaGLIPtin 100 MG tablet Commonly known as:  JANUVIA Take 1 tablet (100 mg total) by mouth daily.       Allergies:  Allergies  Allergen Reactions  . Penicillins Other (See Comments)    Has patient had a PCN reaction  causing immediate rash, facial/tongue/throat swelling, SOB or lightheadedness with hypotension: Unknown Has patient had a PCN reaction causing severe rash involving mucus membranes or skin necrosis: Unknown Has patient had a PCN reaction that required hospitalization: Unknown Has patient had a PCN reaction occurring within the last 10 years: No If all of the above answers are "NO", then may proceed with Cephalosporin use.     Past Medical History:  Diagnosis Date  . Diabetes mellitus    Type II  . Hyperlipidemia   . Hypertension   . Hypothyroidism   . Migraine   . Narcolepsy with cataplexy   . Pneumonia 08/2001    Past Surgical History:  Procedure Laterality Date  .  APPENDECTOMY  age 89  . BIOPSY  05/24/2018   Procedure: BIOPSY;  Surgeon: Meridee ScoreMansouraty, Netty StarringGabriel Jr., MD;  [facility];  Service: Gastroenterology;;  . COLONOSCOPY N/A 05/24/2018   Procedure: COLONOSCOPY;  Surgeon: Lemar LoftyMansouraty, Gabriel Jr., MD;  [facility];  Service: Gastroenterology;  Laterality: N/A;  . hemilaminectomy     C5-6; C6-7  . PARTIAL HYSTERECTOMY  age 78  . POSTERIOR CERVICAL FUSION/FORAMINOTOMY  02/05/1975   C5-C6-C7    Family History  Problem Relation Age of Onset  . Depression Brother        suicide  . Diabetes Brother   . Heart disease Mother        MI  . Peripheral vascular disease Mother   . Hyperlipidemia Mother   . Diabetes Brother   . Sleep apnea Son   . Diabetes Son   . Sleep apnea Brother     Social History:  reports that she quit smoking about 38 years ago. She has never used smokeless tobacco. She reports that she does not drink alcohol or use drugs.   Review of Systems  Constitutional: Positive for weight gain.  HENT: Negative for headaches.        Has difficulty with hearing  Eyes: Negative for blurred vision.  Respiratory: Negative for shortness of breath.   Cardiovascular: Negative for leg swelling.  Gastrointestinal: Negative for abdominal pain.    Endocrine: Negative for fatigue and polydipsia.  Genitourinary: Positive for nocturia.  Musculoskeletal: Negative for back pain.  Skin: Negative for rash.  Neurological: Negative for numbness and tingling.  Psychiatric/Behavioral: Negative for insomnia.     Lipid history: Has been treated by her PCP with simvastatin 80 mg    Lab Results  Component Value Date   CHOL 181 06/24/2018   HDL 38.20 (L) 06/24/2018   LDLCALC 73 06/21/2014   LDLDIRECT 86.0 06/24/2018   TRIG 370.0 (H) 06/24/2018   CHOLHDL 5 06/24/2018           Hypertension: Has been treated with HCTZ and lisinopril by her PCP She does not monitor at home  BP Readings from Last 3 Encounters:  07/19/18 (!) 170/80  07/08/18 (!) 142/84  06/30/18 135/70    Most recent eye exam was in 10/18  Most recent foot exam:2/20  Currently known complications of diabetes: None  LABS:  No visits with results within 1 Week(s) from this visit.  Latest known visit with results is:  Lab on 06/24/2018  Component Date Value Ref Range Status  . Cholesterol 06/24/2018 181  0 - 200 mg/dL Final   ATP III Classification       Desirable:  < 200 mg/dL               Borderline High:  200 - 239 mg/dL          High:  > = 098240 mg/dL  . Triglycerides 06/24/2018 370.0* 0.0 - 149.0 mg/dL Final   Normal:  <119<150 mg/dLBorderline High:  150 - 199 mg/dL  . HDL 06/24/2018 38.20* >39.00 mg/dL Final  . VLDL 14/78/295601/16/2020 74.0* 0.0 - 40.0 mg/dL Final  . Total CHOL/HDL Ratio 06/24/2018 5   Final                  Men          Women1/2 Average Risk     3.4          3.3Average Risk          5.0  4.42X Average Risk          9.6          7.13X Average Risk          15.0          11.0                      . NonHDL 06/24/2018 143.23   Final   NOTE:  Non-HDL goal should be 30 mg/dL higher than patient's LDL goal (i.e. LDL goal of < 70 mg/dL, would have non-HDL goal of < 100 mg/dL)  . Hgb A1c MFr Bld 06/24/2018 8.9* 4.6 - 6.5 % Final   Glycemic Control  Guidelines for People with Diabetes:Non Diabetic:  <6%Goal of Therapy: <7%Additional Action Suggested:  >8%   . Sodium 06/24/2018 134* 135 - 145 mEq/L Final  . Potassium 06/24/2018 3.8  3.5 - 5.1 mEq/L Final  . Chloride 06/24/2018 96  96 - 112 mEq/L Final  . CO2 06/24/2018 29  19 - 32 mEq/L Final  . Glucose, Bld 06/24/2018 234* 70 - 99 mg/dL Final  . BUN 16/03/9603 22  6 - 23 mg/dL Final  . Creatinine, Ser 06/24/2018 1.08  0.40 - 1.20 mg/dL Final  . Total Bilirubin 06/24/2018 0.4  0.2 - 1.2 mg/dL Final  . Alkaline Phosphatase 06/24/2018 100  39 - 117 U/L Final  . AST 06/24/2018 14  0 - 37 U/L Final  . ALT 06/24/2018 20  0 - 35 U/L Final  . Total Protein 06/24/2018 7.2  6.0 - 8.3 g/dL Final  . Albumin 54/02/8118 4.3  3.5 - 5.2 g/dL Final  . Calcium 14/78/2956 9.0  8.4 - 10.5 mg/dL Final  . GFR 21/30/8657 52.06* >60.00 mL/min Final  . WBC 06/24/2018 11.3* 4.0 - 10.5 K/uL Final  . RBC 06/24/2018 4.19  3.87 - 5.11 Mil/uL Final  . Hemoglobin 06/24/2018 12.4  12.0 - 15.0 g/dL Final  . HCT 84/69/6295 37.1  36.0 - 46.0 % Final  . MCV 06/24/2018 88.5  78.0 - 100.0 fl Final  . MCHC 06/24/2018 33.5  30.0 - 36.0 g/dL Final  . RDW 28/41/3244 13.8  11.5 - 15.5 % Final  . Platelets 06/24/2018 286.0  150.0 - 400.0 K/uL Final  . Neutrophils Relative % 06/24/2018 73.3  43.0 - 77.0 % Final  . Lymphocytes Relative 06/24/2018 19.4  12.0 - 46.0 % Final  . Monocytes Relative 06/24/2018 5.9  3.0 - 12.0 % Final  . Eosinophils Relative 06/24/2018 1.0  0.0 - 5.0 % Final  . Basophils Relative 06/24/2018 0.4  0.0 - 3.0 % Final  . Neutro Abs 06/24/2018 8.3* 1.4 - 7.7 K/uL Final  . Lymphs Abs 06/24/2018 2.2  0.7 - 4.0 K/uL Final  . Monocytes Absolute 06/24/2018 0.7  0.1 - 1.0 K/uL Final  . Eosinophils Absolute 06/24/2018 0.1  0.0 - 0.7 K/uL Final  . Basophils Absolute 06/24/2018 0.0  0.0 - 0.1 K/uL Final  . Direct LDL 06/24/2018 86.0  mg/dL Final   Optimal:  <010 mg/dLNear or Above Optimal:  100-129  mg/dLBorderline High:  130-159 mg/dLHigh:  160-189 mg/dLVery High:  >190 mg/dL    Physical Examination:  BP (!) 170/80 (BP Location: Left Arm, Patient Position: Sitting)   Pulse (!) 104   Temp 98.6 F (37 C) (Oral)   Ht 5' 2.5" (1.588 m)   Wt 164 lb (74.4 kg)   SpO2 95%   BMI 29.52 kg/m   GENERAL:  Patient has abdominal obesity.    HEENT:         Eye exam shows normal external appearance.  Fundus exam shows no retinopathy.  Oral exam shows normal mucosa .   NECK:   There is no lymphadenopathy  Thyroid is not enlarged and no nodules felt.   Carotids are normal to palpation and no bruit heard  LUNGS:         Chest is symmetrical. Lungs are clear to auscultation.Marland Kitchen   HEART:         Heart sounds:  S1 and S2 are normal. No murmur or click heard., no S3 or S4.   ABDOMEN:   There is no distention present. Liver and spleen are not palpable.  No other mass or tenderness present.    NEUROLOGICAL:   Ankle jerks are absent bilaterally.    Diabetic Foot Exam - Simple   Simple Foot Form Diabetic Foot exam was performed with the following findings:  Yes 07/19/2018 12:17 PM  Visual Inspection No deformities, no ulcerations, no other skin breakdown bilaterally:  Yes Sensation Testing Intact to touch and monofilament testing bilaterally:  Yes Pulse Check Posterior Tibialis and Dorsalis pulse intact bilaterally:  Yes Comments            Vibration sense is mildly reduced in distal first toes.  MUSCULOSKELETAL:  There is no swelling or deformity of the peripheral joints.     EXTREMITIES:     There is no ankle edema.  SKIN:       No rash or lesions of concern.        ASSESSMENT:  Diabetes type 2 with poor control  See history of present illness for detailed discussion of current diabetes management, blood sugar patterns and problems identified  Recent A1c of 8.9 indicates progressively worsening hyperglycemia  Current treatment regimen is Januvia, glipizide and only 750 mg  metformin She is not checking her sugars consistently and not clear if her home monitor is accurate She tends to eat large amounts of carbohydrates especially at breakfast and does not have a meal plan Currently not exercising  Complications of diabetes: None evident  HYPERTENSION: Blood pressure is unusually high today although was normal with her PCP Currently on HCTZ and lisinopril  Lipids: Has high triglycerides despite taking simvastatin and may be partly related to poor diabetes control along with obesity and insulin resistance    PLAN:    1. Glucose monitoring: She will need to switch to a brand-name monitor, have given her a One Touch Verio to start using and showed her how to use this.  Prescription will be sent to the [facility] store . Patient advised to check readings either fasting or 2 hours after meals and bring monitor to each visit for download  2.  Diabetes education: . Patient will need meal planning education and this will be arranged by referral.  Recommended that she also bring her son with her since she is sometimes dependent on him for cooking  3.  Lifestyle changes: . Dietary changes: . Exercise regimen:  4.  Medication changes needed: . Add Jardiance 10 mg daily.  This appears to be covered by her insurance Discussed action of SGLT 2 drugs on lowering glucose by decreasing kidney absorption of glucose, benefits of weight loss and lower blood pressure, possible side effects including candidiasis and dosage regimen  . She will reduce HCTZ in half . Also discussed that if her blood sugars are progressively lower especially if below 100  her glipizide will be reduced . At some point may consider increasing her Jardiance and stopping Januvia which may not be very effective, however may also consider using Glyxambi if covered  5.  Preventive care needed:  Annual eye exams  6.  Follow-up: 1 month and will need assessment of her renal function at that time along with  follow-up blood pressure Lipids will need to be checked fasting once blood sugars are better controlled for her high triglycerides    Patient Instructions  Check blood sugars on waking up 3 days a week  Also check blood sugars about 2 hours after meals and do this after different meals by rotation  Recommended blood sugar levels on waking up are 90-130 and about 2 hours after meal is 130-180  Please bring your blood sugar monitor to each visit, thank you  NEW medication: Jardiance 10 mg will be taken in the morning at breakfast time  When starting this increase fluid intake  Also REDUCE hydrochlorothiazide to half tablet  If your blood sugar starts going below 100 let us know we will need to cut down your glipizide to a half a pill Continue Januvia and metformin for now  Start walking regularly    Consultation note has been sent to the referring physician  Reather Littler 07/19/2018, 12:17 PM   Note: This office note was prepared with Dragon voice recognition system technology. Any transcriptional errors that result from this process are unintentional.

## 2018-07-23 ENCOUNTER — Telehealth: Payer: Self-pay | Admitting: Internal Medicine

## 2018-07-23 ENCOUNTER — Other Ambulatory Visit: Payer: Self-pay | Admitting: Internal Medicine

## 2018-07-23 DIAGNOSIS — E1165 Type 2 diabetes mellitus with hyperglycemia: Secondary | ICD-10-CM

## 2018-07-23 MED ORDER — GLUCOSE BLOOD VI STRP: ORAL_STRIP | 3 refills | Status: DC

## 2018-07-23 NOTE — Telephone Encounter (Signed)
Pt called from answering service--pt pharmacy needed diagnose for the test strip. Resent Rx with diagnosed to [facility] pharmacy

## 2018-08-12 ENCOUNTER — Encounter: Payer: Self-pay | Admitting: Dietician

## 2018-08-12 ENCOUNTER — Encounter: Payer: Medicare Other | Attending: Family Medicine | Admitting: Dietician

## 2018-08-12 DIAGNOSIS — E1165 Type 2 diabetes mellitus with hyperglycemia: Secondary | ICD-10-CM | POA: Insufficient documentation

## 2018-08-12 NOTE — Progress Notes (Signed)
Diabetes Self-Management Education  Visit Type: First/Initial  Appt. Start Time: 0935 Appt. End Time: 1105  08/12/2018  [name], identified by name and date of birth, is a 78 y.o. female with a diagnosis of Diabetes: Type 2.   ASSESSMENT History includes type 2 diabetes since 2008, HTN, HLD, hypothyroidism, narcolepsy with cataplexy.   Labs include GFR 52, Cholesterol 181, HDL 38, LDL 86, Triglycerides 370 3/54/5625  Medications include:  Ritalin, Januvia, Glipizide, Metformin ER, Jardiance She will be in the [facility] around October and will not be able to afford some of her medications.  She has a new meter, One Touch Ultra2.  Showed patient how to use this and the lancing devise.  Her BG was 221 3 hours after a bagel and cream cheese.  Her son lives at her home and he does much of the cooking.  She has been cooking more recently or her son gets high fat take out.  They share shopping.   She retired from [employer] and worked at [employer] until recent illness. Her appetite is good.  She states that she eats when she gets nervous.   Recently stopped OJ. She has a friend that lost weight, changed her diet and walks daily and was able to get off of her medication.  This motivates her. Son just put exercise equipment back up. Weight 160 lb (72.6 kg). Body mass index is 28.8 kg/m.  Diabetes Self-Management Education - 08/12/18 0954      Visit Information   Visit Type  First/Initial      Initial Visit   Diabetes Type  Type 2    Are you currently following a meal plan?  No    Are you taking your medications as prescribed?  Yes    Date Diagnosed  2008      Health Coping   How would you rate your overall health?  Good      Psychosocial Assessment   Patient Belief/Attitude about Diabetes  Motivated to manage diabetes    Self-care barriers  None    Self-management support  Doctor's office    Other persons present  Patient    Patient Concerns   Nutrition/Meal planning    Special Needs  None    Preferred Learning Style  No preference indicated    Learning Readiness  Ready    How often do you need to have someone help you when you read instructions, pamphlets, or other written materials from your doctor or pharmacy?  1 - Never    What is the last grade level you completed in school?  GED      Pre-Education Assessment   Patient understands the diabetes disease and treatment process.  Needs Review    Patient understands incorporating nutritional management into lifestyle.  Needs Review    Patient undertands incorporating physical activity into lifestyle.  Needs Review    Patient understands using medications safely.  Needs Review    Patient understands monitoring blood glucose, interpreting and using results  Needs Review    Patient understands prevention, detection, and treatment of acute complications.  Needs Review    Patient understands prevention, detection, and treatment of chronic complications.  Needs Review    Patient understands how to develop strategies to address psychosocial issues.  Needs Review    Patient understands how to develop strategies to promote health/change behavior.  Needs Review      Complications   Last HgB A1C per patient/outside source  8.9 %  06/2018 increased from 8% 03/2018   How often do you check your blood sugar?  3-4 times / week    Fasting Blood glucose range (mg/dL)  163-846;>659;935-701   Decreasing since the Jardiance   Have you had a dilated eye exam in the past 12 months?  Yes    Have you had a dental exam in the past 12 months?  No   dentures   Are you checking your feet?  Yes    How many days per week are you checking your feet?  7      Dietary Intake   Breakfast  Plain instant oatmeal, dry [employer] toast OR bagel, cream cheese OR bacon and eggs, dry [employer] toast OR plain cheerios, banana, 2% milk   8:30-9:30   Snack (morning)  Oatmeal cream cookie or apple or tangerine    Lunch  ham, lettuce  sandwich on pita bread with small amount mayo, handful of corn chips   1   Snack (afternoon)  none    Dinner  chicken breast or salmon or other fish, vegetables, red potatoes, salad OR Meat lover's pizza and chicken tenders OR steak and cheese sub OR [employer], or shrimp, baked sweet potato, salad, fried mushooms and onions   6   Snack (evening)  apple, grapes, or tangerine or occasional ice cream    Beverage(s)  water, diet pepsi (1/2 daily), coffee (black), fruit punch, unsweetened tea      Exercise   Exercise Type  Light (walking / raking leaves)   walking   How many days per week to you exercise?  2    How many minutes per day do you exercise?  30    Total minutes per week of exercise  60      Patient Education   Previous Diabetes Education  Yes (please comment)   when diagnosed   Disease state   Definition of diabetes, type 1 and 2, and the diagnosis of diabetes    Nutrition management   Role of diet in the treatment of diabetes and the relationship between the three main macronutrients and blood glucose level;Food label reading, portion sizes and measuring food.;Carbohydrate counting;Information on hints to eating out and maintain blood glucose control.;Meal options for control of blood glucose level and chronic complications.;Meal timing in regards to the patients' current diabetes medication.    Physical activity and exercise   Role of exercise on diabetes management, blood pressure control and cardiac health.    Medications  Reviewed patients medication for diabetes, action, purpose, timing of dose and side effects.    Monitoring  Purpose and frequency of SMBG.;Taught/evaluated SMBG meter.;Identified appropriate SMBG and/or A1C goals.;Daily foot exams;Yearly dilated eye exam    Acute complications  Taught treatment of hypoglycemia - the 15 rule.    Chronic complications  Relationship between chronic complications and blood glucose control    Psychosocial adjustment  Worked with  patient to identify barriers to care and solutions;Role of stress on diabetes;Identified and addressed patients feelings and concerns about diabetes    Personal strategies to promote health  Lifestyle issues that need to be addressed for better diabetes care      Individualized Goals (developed by patient)   Nutrition  General guidelines for healthy choices and portions discussed;Follow meal plan discussed    Physical Activity  Exercise 5-7 days per week;30 minutes per day    Medications  take my medication as prescribed    Monitoring   test my blood glucose  as discussed    Problem Solving  cooking at home rather than eating out most often    Reducing Risk  examine blood glucose patterns;increase portions of healthy fats    Health Coping  discuss diabetes with (comment)   MD, RD, CDE     Post-Education Assessment   Patient understands the diabetes disease and treatment process.  Demonstrates understanding / competency    Patient understands incorporating nutritional management into lifestyle.  Demonstrates understanding / competency    Patient undertands incorporating physical activity into lifestyle.  Demonstrates understanding / competency    Patient understands using medications safely.  Demonstrates understanding / competency    Patient understands monitoring blood glucose, interpreting and using results  Demonstrates understanding / competency    Patient understands prevention, detection, and treatment of acute complications.  Demonstrates understanding / competency    Patient understands prevention, detection, and treatment of chronic complications.  Demonstrates understanding / competency    Patient understands how to develop strategies to address psychosocial issues.  Demonstrates understanding / competency    Patient understands how to develop strategies to promote health/change behavior.  Needs Review      Outcomes   Expected Outcomes  Demonstrated interest in learning. Expect  positive outcomes    Future DMSE  4-6 wks    Program Status  Completed       Individualized Plan for Diabetes Self-Management Training:   Learning Objective:  Patient will have a greater understanding of diabetes self-management. Patient education plan is to attend individual and/or group sessions per assessed needs and concerns.   Plan:   Patient Instructions  Plan: [employer] job on drinking more water.  Read the label on your fruit punch.  Does this have more than 5 grams of carbs?  If so avoid. Eat more home cooked meals that are low in fat. Walk most days of the week. Instead of eating when you are nervous, what could you do? Avoid snacking overall  Aim for 2-3 Carb Choices per meal (30-45 grams) +/- 1 either way  Aim for 0-1 Carbs per snack if hungry  Include protein in moderation with your meals and snacks Consider reading food labels for Total Carbohydrate of foods Consider  increasing your activity level by walking or gardening or gym for 30 minutes daily as tolerated Continue checking BG at alternate times per day  Continue taking medication as directed by MD    Expected Outcomes:  Demonstrated interest in learning. Expect positive outcomes  Education material provided: How to Thrive:  A Guide for Your Journey with Diabetes, Food label handouts, Meal plan card and Snack sheet  If problems or questions, patient to contact team via:  Phone  Future DSME appointment: 4-6 wks

## 2018-08-12 NOTE — Patient Instructions (Addendum)
Plan: Great job on drinking more water.  Read the label on your fruit punch.  Does this have more than 5 grams of carbs?  If so avoid. Eat more home cooked meals that are low in fat. Walk most days of the week. Instead of eating when you are nervous, what could you do? Avoid snacking overall  Aim for 2-3 Carb Choices per meal (30-45 grams) +/- 1 either way  Aim for 0-1 Carbs per snack if hungry  Include protein in moderation with your meals and snacks Consider reading food labels for Total Carbohydrate of foods Consider  increasing your activity level by walking or gardening or gym for 30 minutes daily as tolerated Continue checking BG at alternate times per day  Continue taking medication as directed by MD

## 2018-08-18 ENCOUNTER — Other Ambulatory Visit: Payer: Self-pay

## 2018-08-18 ENCOUNTER — Ambulatory Visit: Payer: Medicare Other | Admitting: Endocrinology

## 2018-08-18 ENCOUNTER — Encounter: Payer: Self-pay | Admitting: Endocrinology

## 2018-08-18 VITALS — BP 138/76 | HR 67 | Ht 62.5 in | Wt 157.8 lb

## 2018-08-18 DIAGNOSIS — E1165 Type 2 diabetes mellitus with hyperglycemia: Secondary | ICD-10-CM

## 2018-08-18 LAB — BASIC METABOLIC PANEL
BUN: 29 mg/dL — ABNORMAL HIGH (ref 6–23)
CO2: 30 mEq/L (ref 19–32)
Calcium: 9.4 mg/dL (ref 8.4–10.5)
Chloride: 98 mEq/L (ref 96–112)
Creatinine, Ser: 1.05 mg/dL (ref 0.40–1.20)
GFR: 50.58 mL/min — ABNORMAL LOW (ref >60.00)
Glucose, Bld: 128 mg/dL — ABNORMAL HIGH (ref 70–99)
Potassium: 4.1 mEq/L (ref 3.5–5.1)
SODIUM: 135 meq/L (ref 135–145)

## 2018-08-18 MED ORDER — EMPAGLIFLOZIN 25 MG PO TABS: 25.0000 mg | ORAL_TABLET | Freq: Every day | ORAL | 2 refills | Status: DC

## 2018-08-18 NOTE — Patient Instructions (Addendum)
Check blood sugars on waking up 3 days a week  Also check blood sugars about 2 hours after meals and do this after different meals by rotation  Recommended blood sugar levels on waking up are 90-130 and about 2 hours after meal is 130-160  Please bring your blood sugar monitor to each visit, thank you  JARDIANCE 2 PILLS of 10mg  and next Rx will be 25mg   Get eye exam

## 2018-08-18 NOTE — Progress Notes (Signed)
Patient ID: Amanda King, female   DOB: [birth year], 78 y.o.   MRN: [id number]           Reason for Appointment: for Type 2 Diabetes  Referring PCP: Dr. Milinda Antis   History of Present Illness:          Date of diagnosis of type 2 diabetes mellitus:  2008      Background history:  She was initially treated with metformin and subsequently glipizide added Januvia was added in 12/2016 She has had variable control with A1c as high as 10.3 done in 2015 and as low as 7.3 done in April 2016 Has generally been followed by her PCP about twice a year  Recent history:   Most recent A1c is 8.9 done on 06/24/2018      Non-insulin hypoglycemic drugs the patient is taking are: Jardiance 10 mg daily, Januvia 100 mg daily, glipizide ER 10 mg daily, metformin ER 750 mg daily  Current management, blood sugar patterns and problems identified:  She has started on Jardiance as directed in February  However does not appear to have any improvement in her blood sugars as they look similar to previous readings  She thinks that she has only recently started cutting back on portions and following instructions from dietitian  However blood sugar was about 250 last night after eating a sandwich only  This is despite her losing 7 pounds with starting Jardiance  No side effects with this  Also she thinks she is compliant with her other medications as before  Not doing enough readings after meals and mostly at breakfast and lunch        Side effects from medications have been: Diarrhea from high dose metformin            Exercise:  walking recently  Glucose monitoring:  done less than 1 times a day         Glucometer:  One Touch variable      Blood Glucose readings by time of day and averages from meter download:  FASTING range 140-189 Nonfasting range 143-216 with mostly readings around lunchtime MEDIAN overall 195 out of 12 readings  Previous readings:  PREMEAL Breakfast Lunch Dinner Bedtime   Overall   Glucose range: 160-180  170    Median:        POST-MEAL PC Breakfast PC Lunch PC Dinner  Glucose range: 200 180-213   Median:       Dietician visit, most recent: 08/12/2018  Weight history:  Wt Readings from Last 3 Encounters:  08/18/18 157 lb 12.8 oz (71.6 kg)  08/12/18 160 lb (72.6 kg)  07/19/18 164 lb (74.4 kg)    Glycemic control:   Lab Results  Component Value Date   HGBA1C 8.9 (H) 06/24/2018   HGBA1C 8.0 (H) 03/09/2018   HGBA1C 7.4 (H) 08/18/2017   Lab Results  Component Value Date   MICROALBUR 0.6 12/21/2008   LDLCALC 73 06/21/2014   CREATININE 1.08 06/24/2018   Lab Results  Component Value Date   MICRALBCREAT 5.2 12/21/2008    No results found for: FRUCTOSAMINE  No visits with results within 1 Week(s) from this visit.  Latest known visit with results is:  Lab on 06/24/2018  Component Date Value Ref Range Status   Cholesterol 06/24/2018 181  0 - 200 mg/dL Final   ATP III Classification       Desirable:  < 200 mg/dL  Borderline High:  200 - 239 mg/dL          High:  > = 161240 mg/dL   Triglycerides 09/60/454001/16/2020 370.0* 0.0 - 149.0 mg/dL Final   Normal:  <981<150 mg/dLBorderline High:  150 - 199 mg/dL   HDL 19/14/782901/16/2020 56.2138.20* >39.00 mg/dL Final   VLDL 30/86/578401/16/2020 74.0* 0.0 - 40.0 mg/dL Final   Total CHOL/HDL Ratio 06/24/2018 5   Final                  Men          Women1/2 Average Risk     3.4          3.3Average Risk          5.0          4.42X Average Risk          9.6          7.13X Average Risk          15.0          11.0                       NonHDL 06/24/2018 143.23   Final   NOTE:  Non-HDL goal should be 30 mg/dL higher than patient's LDL goal (i.e. LDL goal of < 70 mg/dL, would have non-HDL goal of < 100 mg/dL)   Hgb O9GA1c MFr Bld 29/52/841301/16/2020 8.9* 4.6 - 6.5 % Final   Glycemic Control Guidelines for People with Diabetes:Non Diabetic:  <6%Goal of Therapy: <7%Additional Action Suggested:  >8%    Sodium 06/24/2018 134* 135 - 145 mEq/L  Final   Potassium 06/24/2018 3.8  3.5 - 5.1 mEq/L Final   Chloride 06/24/2018 96  96 - 112 mEq/L Final   CO2 06/24/2018 29  19 - 32 mEq/L Final   Glucose, Bld 06/24/2018 234* 70 - 99 mg/dL Final   BUN 24/40/102701/16/2020 22  6 - 23 mg/dL Final   Creatinine, Ser 06/24/2018 1.08  0.40 - 1.20 mg/dL Final   Total Bilirubin 06/24/2018 0.4  0.2 - 1.2 mg/dL Final   Alkaline Phosphatase 06/24/2018 100  39 - 117 U/L Final   AST 06/24/2018 14  0 - 37 U/L Final   ALT 06/24/2018 20  0 - 35 U/L Final   Total Protein 06/24/2018 7.2  6.0 - 8.3 g/dL Final   Albumin 25/36/644001/16/2020 4.3  3.5 - 5.2 g/dL Final   Calcium 34/74/259501/16/2020 9.0  8.4 - 10.5 mg/dL Final   GFR 63/87/564301/16/2020 52.06* >60.00 mL/min Final   WBC 06/24/2018 11.3* 4.0 - 10.5 K/uL Final   RBC 06/24/2018 4.19  3.87 - 5.11 Mil/uL Final   Hemoglobin 06/24/2018 12.4  12.0 - 15.0 g/dL Final   HCT 32/95/188401/16/2020 37.1  36.0 - 46.0 % Final   MCV 06/24/2018 88.5  78.0 - 100.0 fl Final   MCHC 06/24/2018 33.5  30.0 - 36.0 g/dL Final   RDW 16/60/630101/16/2020 13.8  11.5 - 15.5 % Final   Platelets 06/24/2018 286.0  150.0 - 400.0 K/uL Final   Neutrophils Relative % 06/24/2018 73.3  43.0 - 77.0 % Final   Lymphocytes Relative 06/24/2018 19.4  12.0 - 46.0 % Final   Monocytes Relative 06/24/2018 5.9  3.0 - 12.0 % Final   Eosinophils Relative 06/24/2018 1.0  0.0 - 5.0 % Final   Basophils Relative 06/24/2018 0.4  0.0 - 3.0 % Final   Neutro Abs 06/24/2018 8.3* 1.4 - 7.7 K/uL Final  Lymphs Abs 06/24/2018 2.2  0.7 - 4.0 K/uL Final   Monocytes Absolute 06/24/2018 0.7  0.1 - 1.0 K/uL Final   Eosinophils Absolute 06/24/2018 0.1  0.0 - 0.7 K/uL Final   Basophils Absolute 06/24/2018 0.0  0.0 - 0.1 K/uL Final   Direct LDL 06/24/2018 86.0  mg/dL Final   Optimal:  <161 mg/dLNear or Above Optimal:  100-129 mg/dLBorderline High:  130-159 mg/dLHigh:  160-189 mg/dLVery High:  >190 mg/dL    Allergies as of 0/96/0454      Reactions   Penicillins Other (See Comments)     Has patient had a PCN reaction causing immediate rash, facial/tongue/throat swelling, SOB or lightheadedness with hypotension: Unknown Has patient had a PCN reaction causing severe rash involving mucus membranes or skin necrosis: Unknown Has patient had a PCN reaction that required hospitalization: Unknown Has patient had a PCN reaction occurring within the last 10 years: No If all of the above answers are "NO", then may proceed with Cephalosporin use.      Medication List       Accurate as of August 18, 2018  1:32 PM. Always use your most recent med list.        Align 4 MG Caps Take by mouth daily.   cetirizine 10 MG tablet Commonly known as:  ZYRTEC Take 10 mg by mouth daily as needed for allergies or rhinitis.   empagliflozin 10 MG Tabs tablet Commonly known as:  Jardiance Take 10 mg by mouth daily with breakfast.   glipiZIDE 10 MG 24 hr tablet Commonly known as:  GLUCOTROL XL Take 1 tablet (10 mg total) by mouth daily with breakfast.   glucose blood test strip Use as instructed to check once daily.   hydrochlorothiazide 25 MG tablet Commonly known as:  HYDRODIURIL Take 1 tablet (25 mg total) by mouth daily.   imipramine 50 MG tablet Commonly known as:  TOFRANIL Take 1 tablet (50 mg total) by mouth at bedtime.   levothyroxine 88 MCG tablet Commonly known as:  SYNTHROID, LEVOTHROID Take 1 tablet (88 mcg total) by mouth daily.   lisinopril 20 MG tablet Commonly known as:  PRINIVIL,ZESTRIL TAKE 1 TABLET BY MOUTH ONCE DAILY   metFORMIN 750 MG 24 hr tablet Commonly known as:  GLUCOPHAGE-XR Take 1 tablet (750 mg total) by mouth daily with breakfast.   methylphenidate 20 MG tablet Commonly known as:  RITALIN Take 1 tablet (20 mg total) by mouth 3 (three) times daily with meals.   onetouch ultrasoft lancets Use as instructed to check blood sugar once daily.   polyethylene glycol packet Commonly known as:  MIRALAX / GLYCOLAX Take 17 g by mouth daily as  needed.   simvastatin 80 MG tablet Commonly known as:  ZOCOR Take 1 tablet (80 mg total) by mouth at bedtime.   sitaGLIPtin 100 MG tablet Commonly known as:  Januvia Take 1 tablet (100 mg total) by mouth daily.       Allergies:  Allergies  Allergen Reactions   Penicillins Other (See Comments)    Has patient had a PCN reaction causing immediate rash, facial/tongue/throat swelling, SOB or lightheadedness with hypotension: Unknown Has patient had a PCN reaction causing severe rash involving mucus membranes or skin necrosis: Unknown Has patient had a PCN reaction that required hospitalization: Unknown Has patient had a PCN reaction occurring within the last 10 years: No If all of the above answers are "NO", then may proceed with Cephalosporin use.     Past Medical History:  Diagnosis Date   Diabetes mellitus    Type II   Hyperlipidemia    Hypertension    Hypothyroidism    Migraine    Narcolepsy with cataplexy    Pneumonia 08/2001    Past Surgical History:  Procedure Laterality Date   APPENDECTOMY  age 58   BIOPSY  05/24/2018   Procedure: BIOPSY;  Surgeon: Meridee Score Netty Starring., MD;  Location: [facility];  Service: Gastroenterology;;   COLONOSCOPY N/A 05/24/2018   Procedure: COLONOSCOPY;  Surgeon: Lemar Lofty., MD;  Location: [facility] ENDOSCOPY;  Service: Gastroenterology;  Laterality: N/A;   hemilaminectomy     C5-6; C6-7   PARTIAL HYSTERECTOMY  age 2   POSTERIOR CERVICAL FUSION/FORAMINOTOMY  02/05/1975   C5-C6-C7    Family History  Problem Relation Age of Onset   Depression Brother        suicide   Diabetes Brother    Heart disease Mother        MI   Peripheral vascular disease Mother    Hyperlipidemia Mother    Diabetes Brother    Sleep apnea Son    Diabetes Son    Sleep apnea Brother     Social History:  reports that she quit smoking about 38 years ago. She has never used smokeless tobacco. She reports that she does not  drink alcohol or use drugs.   Review of Systems   Lipid history: Has been treated by her PCP with simvastatin 80 mg Has high triglycerides   Lab Results  Component Value Date   CHOL 181 06/24/2018   HDL 38.20 (L) 06/24/2018   LDLCALC 73 06/21/2014   LDLDIRECT 86.0 06/24/2018   TRIG 370.0 (H) 06/24/2018   CHOLHDL 5 06/24/2018           Hypertension: Has been treated with HCTZ, now half tablet and lisinopril by her PCP She does not monitor at home  BP Readings from Last 3 Encounters:  08/18/18 138/76  07/19/18 (!) 170/80  07/08/18 (!) 142/84    Most recent eye exam was in 10/18  Most recent foot exam:2/20  Currently known complications of diabetes: None  LABS:  No visits with results within 1 Week(s) from this visit.  Latest known visit with results is:  Lab on 06/24/2018  Component Date Value Ref Range Status   Cholesterol 06/24/2018 181  0 - 200 mg/dL Final   ATP III Classification       Desirable:  < 200 mg/dL               Borderline High:  200 - 239 mg/dL          High:  > = 161 mg/dL   Triglycerides 09/60/4540 370.0* 0.0 - 149.0 mg/dL Final   Normal:  <981 mg/dLBorderline High:  150 - 199 mg/dL   HDL 19/14/7829 56.21* >39.00 mg/dL Final   VLDL 30/86/5784 74.0* 0.0 - 40.0 mg/dL Final   Total CHOL/HDL Ratio 06/24/2018 5   Final                  Men          Women1/2 Average Risk     3.4          3.3Average Risk          5.0          4.42X Average Risk          9.6          7.13X Average Risk  15.0          11.0                       NonHDL 06/24/2018 143.23   Final   NOTE:  Non-HDL goal should be 30 mg/dL higher than patient's LDL goal (i.e. LDL goal of < 70 mg/dL, would have non-HDL goal of < 100 mg/dL)   Hgb T5V MFr Bld 76/16/0737 8.9* 4.6 - 6.5 % Final   Glycemic Control Guidelines for People with Diabetes:Non Diabetic:  <6%Goal of Therapy: <7%Additional Action Suggested:  >8%    Sodium 06/24/2018 134* 135 - 145 mEq/L Final   Potassium  06/24/2018 3.8  3.5 - 5.1 mEq/L Final   Chloride 06/24/2018 96  96 - 112 mEq/L Final   CO2 06/24/2018 29  19 - 32 mEq/L Final   Glucose, Bld 06/24/2018 234* 70 - 99 mg/dL Final   BUN 10/62/6948 22  6 - 23 mg/dL Final   Creatinine, Ser 06/24/2018 1.08  0.40 - 1.20 mg/dL Final   Total Bilirubin 06/24/2018 0.4  0.2 - 1.2 mg/dL Final   Alkaline Phosphatase 06/24/2018 100  39 - 117 U/L Final   AST 06/24/2018 14  0 - 37 U/L Final   ALT 06/24/2018 20  0 - 35 U/L Final   Total Protein 06/24/2018 7.2  6.0 - 8.3 g/dL Final   Albumin 54/62/7035 4.3  3.5 - 5.2 g/dL Final   Calcium 00/93/8182 9.0  8.4 - 10.5 mg/dL Final   GFR 99/37/1696 52.06* >60.00 mL/min Final   WBC 06/24/2018 11.3* 4.0 - 10.5 K/uL Final   RBC 06/24/2018 4.19  3.87 - 5.11 Mil/uL Final   Hemoglobin 06/24/2018 12.4  12.0 - 15.0 g/dL Final   HCT 78/93/8101 37.1  36.0 - 46.0 % Final   MCV 06/24/2018 88.5  78.0 - 100.0 fl Final   MCHC 06/24/2018 33.5  30.0 - 36.0 g/dL Final   RDW 75/03/2584 13.8  11.5 - 15.5 % Final   Platelets 06/24/2018 286.0  150.0 - 400.0 K/uL Final   Neutrophils Relative % 06/24/2018 73.3  43.0 - 77.0 % Final   Lymphocytes Relative 06/24/2018 19.4  12.0 - 46.0 % Final   Monocytes Relative 06/24/2018 5.9  3.0 - 12.0 % Final   Eosinophils Relative 06/24/2018 1.0  0.0 - 5.0 % Final   Basophils Relative 06/24/2018 0.4  0.0 - 3.0 % Final   Neutro Abs 06/24/2018 8.3* 1.4 - 7.7 K/uL Final   Lymphs Abs 06/24/2018 2.2  0.7 - 4.0 K/uL Final   Monocytes Absolute 06/24/2018 0.7  0.1 - 1.0 K/uL Final   Eosinophils Absolute 06/24/2018 0.1  0.0 - 0.7 K/uL Final   Basophils Absolute 06/24/2018 0.0  0.0 - 0.1 K/uL Final   Direct LDL 06/24/2018 86.0  mg/dL Final   Optimal:  <277 mg/dLNear or Above Optimal:  100-129 mg/dLBorderline High:  130-159 mg/dLHigh:  160-189 mg/dLVery High:  >190 mg/dL    Physical Examination:  BP 138/76 (BP Location: Left Arm, Patient Position: Sitting, Cuff Size:  Normal)    Pulse 67    Ht 5' 2.5" (1.588 m)    Wt 157 lb 12.8 oz (71.6 kg)    SpO2 97%    BMI 28.40 kg/m   Exam not indicated  ASSESSMENT:  Diabetes type 2 with poor control  See history of present illness for detailed discussion of current diabetes management, blood sugar patterns and problems identified  Her last A1c was 8.9  Current treatment regimen is Jardiance in addition to her previous regimen of Januvia, glipizide and only 750 mg metformin Although she has lost weight her blood sugars do not appear to be better She is only recently starting to do some walking for exercise and following instructions given by dietitian less than a week ago However even last night with only eating a sandwich her blood sugar was about 250 indicating progressive insulin deficiency    HYPERTENSION: Blood pressure is relatively better even with reducing HCTZ in half, benefiting from Jardiance    PLAN:    Glucose monitoring: She will need to check blood sugars more regularly including after meals after supper  Medication changes needed:  Increase Jardiance 10 mg up to 2 tablets daily.  Next prescription will be 25 mg  No change in other medication  May consider a GLP-1 or Soliqua on her next visit if she has consistently high readings to replace Januvia  She will have renal function checked today Encouraged her to schedule her eye exam for follow-up   She will need to also have an A1c checked on her follow-up visit in about 6 weeks  There are no Patient Instructions on file for this visit.     Reather Littler 08/18/2018, 1:32 PM   Note: This office note was prepared with Dragon voice recognition system technology. Any transcriptional errors that result from this process are unintentional.

## 2018-08-19 LAB — FRUCTOSAMINE: Fructosamine: 349 umol/L — ABNORMAL HIGH (ref 0–285)

## 2018-08-26 ENCOUNTER — Other Ambulatory Visit: Payer: Self-pay | Admitting: Family Medicine

## 2018-08-26 NOTE — Telephone Encounter (Signed)
Routing to Endo since they have taken over DM care

## 2018-09-16 ENCOUNTER — Ambulatory Visit: Payer: Medicare Other | Admitting: Dietician

## 2018-09-18 ENCOUNTER — Other Ambulatory Visit: Payer: Self-pay | Admitting: Family Medicine

## 2018-09-29 ENCOUNTER — Other Ambulatory Visit: Payer: Self-pay

## 2018-09-29 MED ORDER — EMPAGLIFLOZIN 25 MG PO TABS: 25.0000 mg | ORAL_TABLET | Freq: Every day | ORAL | 2 refills | Status: DC

## 2018-09-30 ENCOUNTER — Ambulatory Visit: Payer: Medicare Other | Admitting: Endocrinology

## 2018-09-30 ENCOUNTER — Encounter: Payer: Self-pay | Admitting: Endocrinology

## 2018-09-30 ENCOUNTER — Other Ambulatory Visit: Payer: Self-pay

## 2018-09-30 VITALS — BP 140/66 | HR 94 | Ht 62.5 in | Wt 157.4 lb

## 2018-09-30 DIAGNOSIS — E1165 Type 2 diabetes mellitus with hyperglycemia: Secondary | ICD-10-CM

## 2018-09-30 LAB — POCT GLYCOSYLATED HEMOGLOBIN (HGB A1C): Hemoglobin A1C: 7.9 % — AB (ref 4.0–5.6)

## 2018-09-30 NOTE — Progress Notes (Signed)
Patient ID: Amanda SparrowBrenda C Gulledge, female   DOB: 1941-02-25, 78 y.o.   MRN: [id number]           Reason for Appointment: Follow-up for Type 2 Diabetes  Referring PCP: Dr. Milinda Antisower   History of Present Illness:          Date of diagnosis of type 2 diabetes mellitus:  2008      Background history:  She was initially treated with metformin and subsequently glipizide added Januvia was added in 12/2016 She has had variable control with A1c as high as 10.3 done in 2015 and as low as 7.3 done in April 2016 Has generally been followed by her PCP about twice a year  Recent history:    Her A1c is now 7.9 compared to 8.9 done about 3 months ago   Non-insulin hypoglycemic drugs the patient is taking are: Jardiance 25 mg daily, Januvia 100 mg daily, glipizide ER 10 mg daily, metformin ER 750 mg daily  Current management, blood sugar patterns and problems identified:  She has been on Jardiance 25 mg since her last visit as directed in February  Again her blood sugars are not improving and appears to have mostly consistently high fasting readings  As before checking blood sugars infrequently, previously discussed how often to check the sugars  She has a few better readings midday and afternoon but also has somewhat higher readings after about 9 PM, usually not checking enough readings after meals  Her weight is the same over the last 6 weeks since her last visit  She thinks that she is watching her portions and avoiding high-fat foods usually, has seen the dietitian in early March also  Her bedtime snack is usually a fruit like an apple  Trying to walk more regularly also when she can  She is concerned about the cost of Jardiance        Side effects from medications have been: Diarrhea from high dose metformin            Exercise:  walking regularly  Glucose monitoring:  done less than 1 times a day         Glucometer:  One Touch       Blood Glucose readings by time of day and averages  from meter download:   PRE-MEAL Fasting Lunch  evening Bedtime Overall  Glucose range:  172-212   146-234    Mean/median:  183  182    182   POST-MEAL PC Breakfast PC Lunch PC Dinner  Glucose range:   132-195   Mean/median:   141      Dietician visit, most recent: 08/12/2018  Weight history:  Wt Readings from Last 3 Encounters:  09/30/18 157 lb 6.4 oz (71.4 kg)  08/18/18 157 lb 12.8 oz (71.6 kg)  08/12/18 160 lb (72.6 kg)    Glycemic control:   Lab Results  Component Value Date   HGBA1C 7.9 (A) 09/30/2018   HGBA1C 8.9 (H) 06/24/2018   HGBA1C 8.0 (H) 03/09/2018   Lab Results  Component Value Date   MICROALBUR 0.6 12/21/2008   LDLCALC 73 06/21/2014   CREATININE 1.05 08/18/2018   Lab Results  Component Value Date   MICRALBCREAT 5.2 12/21/2008    Lab Results  Component Value Date   FRUCTOSAMINE 349 (H) 08/18/2018    Office Visit on 09/30/2018  Component Date Value Ref Range Status  . Hemoglobin A1C 09/30/2018 7.9* 4.0 - 5.6 % Final    Allergies as of 09/30/2018  Reactions   Penicillins Other (See Comments)   Has patient had a PCN reaction causing immediate rash, facial/tongue/throat swelling, SOB or lightheadedness with hypotension: Unknown Has patient had a PCN reaction causing severe rash involving mucus membranes or skin necrosis: Unknown Has patient had a PCN reaction that required hospitalization: Unknown Has patient had a PCN reaction occurring within the last 10 years: No If all of the above answers are "NO", then may proceed with Cephalosporin use.      Medication List       Accurate as of September 30, 2018  2:38 PM. Always use your most recent med list.        Align 4 MG Caps Take by mouth daily.   cetirizine 10 MG tablet Commonly known as:  ZYRTEC Take 10 mg by mouth daily as needed for allergies or rhinitis.   empagliflozin 25 MG Tabs tablet Commonly known as:  JARDIANCE Take 25 mg by mouth daily.   glipiZIDE 10 MG 24 hr tablet  Commonly known as:  GLUCOTROL XL Take 1 tablet (10 mg total) by mouth daily with breakfast.   glucose blood test strip Use as instructed to check once daily.   hydrochlorothiazide 25 MG tablet Commonly known as:  HYDRODIURIL Take 1 tablet (25 mg total) by mouth daily.   imipramine 50 MG tablet Commonly known as:  TOFRANIL Take 1 tablet (50 mg total) by mouth at bedtime.   Januvia 100 MG tablet Generic drug:  sitaGLIPtin Take 1 tablet by mouth daily   levothyroxine 88 MCG tablet Commonly known as:  SYNTHROID Take 1 tablet (88 mcg total) by mouth daily.   lisinopril 20 MG tablet Commonly known as:  ZESTRIL Take 1 tablet by mouth once daily   metFORMIN 750 MG 24 hr tablet Commonly known as:  GLUCOPHAGE-XR Take 1 tablet (750 mg total) by mouth daily with breakfast.   methylphenidate 20 MG tablet Commonly known as:  RITALIN Take 1 tablet (20 mg total) by mouth 3 (three) times daily with meals.   onetouch ultrasoft lancets Use as instructed to check blood sugar once daily.   polyethylene glycol 17 g packet Commonly known as:  MIRALAX / GLYCOLAX Take 17 g by mouth daily as needed.   simvastatin 80 MG tablet Commonly known as:  ZOCOR Take 1 tablet (80 mg total) by mouth at bedtime.       Allergies:  Allergies  Allergen Reactions  . Penicillins Other (See Comments)    Has patient had a PCN reaction causing immediate rash, facial/tongue/throat swelling, SOB or lightheadedness with hypotension: Unknown Has patient had a PCN reaction causing severe rash involving mucus membranes or skin necrosis: Unknown Has patient had a PCN reaction that required hospitalization: Unknown Has patient had a PCN reaction occurring within the last 10 years: No If all of the above answers are "NO", then may proceed with Cephalosporin use.     Past Medical History:  Diagnosis Date  . Diabetes mellitus    Type II  . Hyperlipidemia   . Hypertension   . Hypothyroidism   . Migraine    . Narcolepsy with cataplexy   . Pneumonia 08/2001    Past Surgical History:  Procedure Laterality Date  . APPENDECTOMY  age 17  . BIOPSY  05/24/2018   Procedure: BIOPSY;  Surgeon: Meridee Score Netty Starring., MD;  Location: [facility] ENDOSCOPY;  Service: Gastroenterology;;  . COLONOSCOPY N/A 05/24/2018   Procedure: COLONOSCOPY;  Surgeon: Lemar Lofty., MD;  Location: [facility] ENDOSCOPY;  Service:  Gastroenterology;  Laterality: N/A;  . hemilaminectomy     C5-6; C6-7  . PARTIAL HYSTERECTOMY  age 38  . POSTERIOR CERVICAL FUSION/FORAMINOTOMY  02/05/1975   C5-C6-C7    Family History  Problem Relation Age of Onset  . Depression Brother        suicide  . Diabetes Brother   . Heart disease Mother        MI  . Peripheral vascular disease Mother   . Hyperlipidemia Mother   . Diabetes Brother   . Sleep apnea Son   . Diabetes Son   . Sleep apnea Brother     Social History:  reports that she quit smoking about 38 years ago. She has never used smokeless tobacco. She reports that she does not drink alcohol or use drugs.   Review of Systems   Lipid history: Has been treated by her PCP with simvastatin 80 mg Has high triglycerides   Lab Results  Component Value Date   CHOL 181 06/24/2018   HDL 38.20 (L) 06/24/2018   LDLCALC 73 06/21/2014   LDLDIRECT 86.0 06/24/2018   TRIG 370.0 (H) 06/24/2018   CHOLHDL 5 06/24/2018           Hypertension: Has been treated with HCTZ, now half tablet and lisinopril by her PCP She does not monitor at home  BP Readings from Last 3 Encounters:  09/30/18 140/66  08/18/18 138/76  07/19/18 (!) 170/80    Most recent eye exam was in 10/18  Most recent foot exam:2/20  Currently known complications of diabetes: None  LABS:  Office Visit on 09/30/2018  Component Date Value Ref Range Status  . Hemoglobin A1C 09/30/2018 7.9* 4.0 - 5.6 % Final    Physical Examination:  BP 140/66 (BP Location: Left Arm, Patient Position: Sitting, Cuff Size: Normal)    Pulse 94   Ht 5' 2.5" (1.588 m)   Wt 157 lb 6.4 oz (71.4 kg)   SpO2 94%   BMI 28.33 kg/m   Exam not indicated  ASSESSMENT:  Diabetes type 2 with poor control  See history of present illness for detailed discussion of current diabetes management, blood sugar patterns and problems identified  Her last A1c was 8.9 and now 7.9  Current treatment regimen is Jardiance 25 mg now in addition to Januvia, glipizide and only 750 mg metformin She is generally doing fairly well with her diet and exercise regimen although weight is about the same She tends to have higher readings fasting and probably some readings after her main meal in the evening also Likely is insulin deficient with no improvement with multiple drugs as above  She has been previously not on any injectable drugs for diabetes  HYPERTENSION: Blood pressure is controlled   PLAN:    Glucose monitoring: She will need to check blood sugars at least once or twice daily She will check fasting readings in the mornings daily to adjust her insulin as below Also discussed checking after meals especially after supper when she may be still running high  Medication changes needed: .  Discussed how basal insulin works, timing of injection, dosage, injection sites.  Demonstrated to her how the injection is done with a sample device . Also discussed titration of her dose by 2 units based on fasting blood sugar every 3 days by 2 units and at target of 90-130 for fasting reading. . Given a flowsheet with instructions on how to keep a record and adjust the doses . She will start with  a starting dose of 8 units . Although she may be able to control her sugars better with Lawana Chambers this appears to be not covered by her insurance and only Lantus is covered . She will stop glipizide Since she has difficulty affording her [place] may consider patient assistance program She will also see the diabetes educator in follow-up in about 2 weeks to  review her progress and identify any problems Follow-up in 3 weeks  Counseling time on subjects discussed in assessment and plan sections is over 50% of today's 25 minute visit   There are no Patient Instructions on file for this visit.     Reather Littler 09/30/2018, 2:38 PM   Note: This office note was prepared with Dragon voice recognition system technology. Any transcriptional errors that result from this process are unintentional.

## 2018-09-30 NOTE — Patient Instructions (Signed)
LANTUS insulin: This insulin provides blood sugar control for up to 24 hours.    Start with 8 units at bedtime daily and increase by 2 units every 3 days until the waking up sugars are under 130.  Then continue the same dose.   If blood sugar is under 90 for 2 days in a row, reduce the dose by 2 units.  Note that this insulin does not control the rise of blood sugar with meals    Stop glipizide

## 2018-10-05 ENCOUNTER — Other Ambulatory Visit: Payer: Self-pay | Admitting: Endocrinology

## 2018-10-05 NOTE — Telephone Encounter (Signed)
MEDICATION: Lantus and OneTouch Verios Flex Test Strips   PHARMACY:  [facility] - [place]  IS THIS A 90 DAY SUPPLY :   IS PATIENT OUT OF MEDICATION:   IF NOT; HOW MUCH IS LEFT:   LAST APPOINTMENT DATE: @4 /23/2020  NEXT APPOINTMENT DATE:@5 /27/2020  DO WE HAVE YOUR PERMISSION TO LEAVE A DETAILED MESSAGE:  OTHER COMMENTS:  Received new meter at last visit with new insulin  **Let patient know to contact pharmacy at the end of the day to make sure medication is ready. **  ** Please notify patient to allow 48-72 hours to process**  **Encourage patient to contact the pharmacy for refills or they can request refills through [facility]**

## 2018-10-06 ENCOUNTER — Other Ambulatory Visit: Payer: Self-pay

## 2018-10-06 DIAGNOSIS — E1165 Type 2 diabetes mellitus with hyperglycemia: Secondary | ICD-10-CM

## 2018-10-06 MED ORDER — GLUCOSE BLOOD VI STRP: ORAL_STRIP | 3 refills | Status: DC

## 2018-10-06 MED ORDER — ONETOUCH ULTRASOFT LANCETS MISC: 3 refills | Status: DC

## 2018-10-06 NOTE — Telephone Encounter (Signed)
rx sent

## 2018-10-12 ENCOUNTER — Other Ambulatory Visit: Payer: Self-pay

## 2018-10-12 ENCOUNTER — Other Ambulatory Visit: Payer: Self-pay | Admitting: Endocrinology

## 2018-10-12 DIAGNOSIS — E1165 Type 2 diabetes mellitus with hyperglycemia: Secondary | ICD-10-CM

## 2018-10-12 MED ORDER — INSULIN PEN NEEDLE 31G X 5 MM MISC: 1 refills | Status: DC

## 2018-10-12 MED ORDER — GLUCOSE BLOOD VI STRP: ORAL_STRIP | 3 refills | Status: DC

## 2018-10-12 MED ORDER — INSULIN GLARGINE 100 UNIT/ML SOLOSTAR PEN: PEN_INJECTOR | SUBCUTANEOUS | 11 refills | Status: DC

## 2018-10-12 NOTE — Telephone Encounter (Signed)
Patient was started on Lantus on her last visit and prescription has been sent today

## 2018-10-12 NOTE — Telephone Encounter (Signed)
According to pt's chart and last office note, pt is not on Lantus, and Rx for test strips was sent on 10/06/2018.  Is patient supposed to be on lantus?

## 2018-10-12 NOTE — Telephone Encounter (Signed)
Patient stating pharmacy still does not have Lantus or Onetouch Verio flex test strips-patient would like a call back once this has been sent in

## 2018-10-13 ENCOUNTER — Other Ambulatory Visit: Payer: Self-pay

## 2018-10-13 MED ORDER — ONETOUCH DELICA LANCETS 30G MISC: 1.0000 | 1 refills | Status: DC

## 2018-10-13 MED ORDER — GLUCOSE BLOOD VI STRP: ORAL_STRIP | 1 refills | Status: DC

## 2018-10-15 ENCOUNTER — Telehealth: Payer: Self-pay | Admitting: Endocrinology

## 2018-10-15 NOTE — Telephone Encounter (Signed)
MEDICATION: test strips for Contour Flex  PHARMACY:  [facility] at [place]  IS THIS A 90 DAY SUPPLY :   IS PATIENT OUT OF MEDICATION: yes  IF NOT; HOW MUCH IS LEFT:  none  LAST APPOINTMENT DATE: @5 /11/2018  NEXT APPOINTMENT DATE:@5 /27/2020  DO WE HAVE YOUR PERMISSION TO LEAVE A DETAILED MESSAGE: yes  OTHER COMMENTS:    **Let patient know to contact pharmacy at the end of the day to make sure medication is ready. **  ** Please notify patient to allow 48-72 hours to process**  **Encourage patient to contact the pharmacy for refills or they can request refills through [facility]**

## 2018-10-18 ENCOUNTER — Other Ambulatory Visit: Payer: Self-pay

## 2018-10-18 MED ORDER — GLUCOSE BLOOD VI STRP: ORAL_STRIP | 1 refills | Status: DC

## 2018-10-18 NOTE — Telephone Encounter (Signed)
Rx sent for verio test strips, which is what is on patients chart already. Cannot order contour flex strips, as this is no longer made.

## 2018-10-20 ENCOUNTER — Encounter: Payer: Medicare Other | Admitting: Nutrition

## 2018-11-01 ENCOUNTER — Other Ambulatory Visit: Payer: Self-pay | Admitting: Neurology

## 2018-11-01 DIAGNOSIS — G47411 Narcolepsy with cataplexy: Secondary | ICD-10-CM

## 2018-11-03 ENCOUNTER — Ambulatory Visit: Payer: Medicare Other | Admitting: Endocrinology

## 2018-11-21 NOTE — Progress Notes (Signed)
Patient ID: Amanda SparrowBrenda C King, female   DOB: 08-Apr-1941, 78 y.o.   MRN: [id number]           Reason for Appointment: Follow-up for Type 2 Diabetes  Referring PCP: Dr. Milinda Antisower   History of Present Illness:          Date of diagnosis of type 2 diabetes mellitus:  2008      Background history:  She was initially treated with metformin and subsequently glipizide added Januvia was added in 12/2016 She has had variable control with A1c as high as 10.3 done in 2015 and as low as 7.3 done in April 2016 Has generally been followed by her PCP about twice a year  Recent history:    Her A1c is now 7.9 compared to 8.9 done about 3 months ago   Non-insulin hypoglycemic drugs: Jardiance 25 mg daily, Januvia 100 mg daily, glipizide ER 10 mg daily, metformin ER 750 mg daily  INSULIN doses: Lantus 10 units daily  Current management, blood sugar patterns and problems identified:  She has been on Lantus insulin since 09/2018  Prior to that her morning sugars were as high as 212  She did monitor her home readings in the mornings and went up 2 units since her starting dose of 8 units  Did not follow-up as scheduled last month but she continues to have improving blood sugars  Also no hypoglycemia with insulin, lowest blood sugars have been in the 80s the last couple of days  She is trying to cut back on portions at meals, using small plates for her food  However she still has a high carbohydrate breakfast including cereal or oatmeal or just toast  Highest reading was after eating 2 slices of bread and GM today at breakfast  POSTPRANDIAL readings are not being monitored much she has only occasional readings later in the day which are not usually high including yesterday when she monitored after lunch and dinner  Despite not having high blood sugars and using insulin her weight has actually come down 1 pound  She is concerned about the cost of Jardiance and Januvia and wants to stop these         Side effects from medications have been: Diarrhea from high dose metformin            Exercise:  walking regularly  Glucose monitoring:  done less than 1 times a day         Glucometer:  One Touch       Blood Glucose readings by time of day and averages from meter download:   PRE-MEAL Fasting Lunch Dinner Bedtime Overall  Glucose range:  84-174   88, 166  128   Mean/median:  137    136   POST-MEAL PC Breakfast PC Lunch PC Dinner  Glucose range:  84-200    Mean/median:      Previous readings:  PRE-MEAL Fasting Lunch  evening Bedtime Overall  Glucose range:  172-212   146-234    Mean/median:  183  182    182   POST-MEAL PC Breakfast PC Lunch PC Dinner  Glucose range:   132-195   Mean/median:   141      Dietician visit, most recent: 08/12/2018  Weight history:  Wt Readings from Last 3 Encounters:  11/22/18 156 lb 9.6 oz (71 kg)  09/30/18 157 lb 6.4 oz (71.4 kg)  08/18/18 157 lb 12.8 oz (71.6 kg)    Glycemic control:   Lab Results  Component Value Date   HGBA1C 7.9 (A) 09/30/2018   HGBA1C 8.9 (H) 06/24/2018   HGBA1C 8.0 (H) 03/09/2018   Lab Results  Component Value Date   MICROALBUR 0.6 12/21/2008   LDLCALC 73 06/21/2014   CREATININE 1.05 08/18/2018   Lab Results  Component Value Date   MICRALBCREAT 5.2 12/21/2008    Lab Results  Component Value Date   FRUCTOSAMINE 349 (H) 08/18/2018    No visits with results within 1 Week(s) from this visit.  Latest known visit with results is:  Office Visit on 09/30/2018  Component Date Value Ref Range Status  . Hemoglobin A1C 09/30/2018 7.9* 4.0 - 5.6 % Final    Allergies as of 11/22/2018      Reactions   Penicillins Other (See Comments)   Has patient had a PCN reaction causing immediate rash, facial/tongue/throat swelling, SOB or lightheadedness with hypotension: Unknown Has patient had a PCN reaction causing severe rash involving mucus membranes or skin necrosis: Unknown Has patient had a PCN reaction that  required hospitalization: Unknown Has patient had a PCN reaction occurring within the last 10 years: No If all of the above answers are "NO", then may proceed with Cephalosporin use.      Medication List       Accurate as of November 22, 2018  2:21 PM. If you have any questions, ask your nurse or doctor.        Align 4 MG Caps Take by mouth daily.   cetirizine 10 MG tablet Commonly known as: ZYRTEC Take 10 mg by mouth daily as needed for allergies or rhinitis.   empagliflozin 25 MG Tabs tablet Commonly known as: JARDIANCE Take 25 mg by mouth daily.   glipiZIDE 10 MG 24 hr tablet Commonly known as: GLUCOTROL XL Take 1 tablet (10 mg total) by mouth daily with breakfast.   glucose blood test strip Use onetouch verio test strip as instructed to check blood sugar once daily.   hydrochlorothiazide 25 MG tablet Commonly known as: HYDRODIURIL Take 1 tablet (25 mg total) by mouth daily.   imipramine 50 MG tablet Commonly known as: TOFRANIL TAKE 1 TABLET BY MOUTH AT BEDTIME   Insulin Glargine 100 UNIT/ML Solostar Pen Commonly known as: LANTUS Start with 8 units once a day and increase as directed   Insulin Pen Needle 31G X 5 MM Misc Use for Lantus once a day   Januvia 100 MG tablet Generic drug: sitaGLIPtin Take 1 tablet by mouth daily   levothyroxine 88 MCG tablet Commonly known as: SYNTHROID Take 1 tablet (88 mcg total) by mouth daily.   lisinopril 20 MG tablet Commonly known as: ZESTRIL Take 1 tablet by mouth once daily   metFORMIN 750 MG 24 hr tablet Commonly known as: GLUCOPHAGE-XR Take 1 tablet (750 mg total) by mouth daily with breakfast.   methylphenidate 20 MG tablet Commonly known as: RITALIN Take 1 tablet (20 mg total) by mouth 3 (three) times daily with meals.   OneTouch Delica Lancets 30G Misc 1 each by Does not apply route See admin instructions. Use onetouch delica lancet to check blood sugar once daily.   polyethylene glycol 17 g packet Commonly  known as: MIRALAX / GLYCOLAX Take 17 g by mouth daily as needed.   simvastatin 80 MG tablet Commonly known as: ZOCOR Take 1 tablet (80 mg total) by mouth at bedtime.       Allergies:  Allergies  Allergen Reactions  . Penicillins Other (See Comments)    Has  patient had a PCN reaction causing immediate rash, facial/tongue/throat swelling, SOB or lightheadedness with hypotension: Unknown Has patient had a PCN reaction causing severe rash involving mucus membranes or skin necrosis: Unknown Has patient had a PCN reaction that required hospitalization: Unknown Has patient had a PCN reaction occurring within the last 10 years: No If all of the above answers are "NO", then may proceed with Cephalosporin use.     Past Medical History:  Diagnosis Date  . Diabetes mellitus    Type II  . Hyperlipidemia   . Hypertension   . Hypothyroidism   . Migraine   . Narcolepsy with cataplexy   . Pneumonia 08/2001    Past Surgical History:  Procedure Laterality Date  . APPENDECTOMY  age 6  . BIOPSY  05/24/2018   Procedure: BIOPSY;  Surgeon: Rush Landmark Telford Nab., MD;  Location: [place];  Service: Gastroenterology;;  . COLONOSCOPY N/A 05/24/2018   Procedure: COLONOSCOPY;  Surgeon: Irving Copas., MD;  Location: [place];  Service: Gastroenterology;  Laterality: N/A;  . hemilaminectomy     C5-6; C6-7  . PARTIAL HYSTERECTOMY  age 69  . POSTERIOR CERVICAL FUSION/FORAMINOTOMY  02/05/1975   C5-C6-C7    Family History  Problem Relation Age of Onset  . Depression Brother        suicide  . Diabetes Brother   . Heart disease Mother        MI  . Peripheral vascular disease Mother   . Hyperlipidemia Mother   . Diabetes Brother   . Sleep apnea Son   . Diabetes Son   . Sleep apnea Brother     Social History:  reports that she quit smoking about 38 years ago. She has never used smokeless tobacco. She reports that she does not drink alcohol or use drugs.   Review of Systems    Lipid history: Has been treated by her PCP with simvastatin 80 mg Has high triglycerides   Lab Results  Component Value Date   CHOL 181 06/24/2018   HDL 38.20 (L) 06/24/2018   LDLCALC 73 06/21/2014   LDLDIRECT 86.0 06/24/2018   TRIG 370.0 (H) 06/24/2018   CHOLHDL 5 06/24/2018           Hypertension: Has been treated with HCTZ, now half tablet and lisinopril by her PCP She does not monitor at home  BP Readings from Last 3 Encounters:  11/22/18 (!) 142/70  09/30/18 140/66  08/18/18 138/76    Most recent eye exam was in 10/18  Most recent foot exam:2/20  Currently known complications of diabetes: None  Hypothyroidism followed by PCP  Lab Results  Component Value Date   TSH 1.34 03/09/2018     LABS:  No visits with results within 1 Week(s) from this visit.  Latest known visit with results is:  Office Visit on 09/30/2018  Component Date Value Ref Range Status  . Hemoglobin A1C 09/30/2018 7.9* 4.0 - 5.6 % Final    Physical Examination:  BP (!) 142/70 (BP Location: Left Arm, Patient Position: Sitting, Cuff Size: Normal)   Pulse 92   Ht 5' 2.5" (1.588 m)   Wt 156 lb 9.6 oz (71 kg)   SpO2 98%   BMI 28.19 kg/m   Exam not indicated  ASSESSMENT:  Diabetes type 2 with poor control  See history of present illness for detailed discussion of current diabetes management, blood sugar patterns and problems identified  Her last A1c was 7.9  She has had no difficulty starting Lantus  insulin With using only 10 units of blood sugars are much better and as low as 84 fasting now Also recently not having significant high readings after meals except when she has more carbohydrate She has managed to keep her weight down However not checking enough readings after meals  Discussed in detail the problem areas with her management as well as adjustment of medications and how to reduce her cost Reviewed her meal planning  She is appearing more motivated to manage her  diabetes and has changed her diet on her own  HYPERTENSION: Blood pressure is controlled   PLAN:    Glucose testing: She will need to check blood sugars at least after 1 meal daily Discussed types of protein to add at breakfast instead of just eating cereal or toast She will not change her insulin as yet but discussed that if her blood sugars start getting below 80 she will cut down her Lantus to at least 8 units She can leave off the Januvia to see if her blood sugars are still well controlled without it and this will help her save on her cost Continue reducing portions Discussed rotation of injection sites and to plan to take Lantus at the same time daily Discussed target for postprandial readings which she has not monitored recently A1c in about 2 months  Counseling time on subjects discussed in assessment and plan sections is over 50% of today's 25 minute visit   Patient Instructions  Check blood sugars on waking up 4 days a week  Also check blood sugars about 2 hours after meals and do this after different meals by rotation  Recommended blood sugar levels on waking up are 90-130 and about 2 hours after meal is 130-160  Please bring your blood sugar monitor to each visit, thank you  Have a protein like egg, cottage cheese or lean meat at Breakfast      Reather LittlerAjay Breona Cherubin 11/22/2018, 2:21 PM   Note: This office note was prepared with Dragon voice recognition system technology. Any transcriptional errors that result from this process are unintentional.

## 2018-11-22 ENCOUNTER — Ambulatory Visit: Payer: Medicare Other | Admitting: Endocrinology

## 2018-11-22 ENCOUNTER — Other Ambulatory Visit: Payer: Self-pay

## 2018-11-22 ENCOUNTER — Encounter: Payer: Self-pay | Admitting: Endocrinology

## 2018-11-22 VITALS — BP 142/70 | HR 92 | Ht 62.5 in | Wt 156.6 lb

## 2018-11-22 DIAGNOSIS — I10 Essential (primary) hypertension: Secondary | ICD-10-CM | POA: Diagnosis not present

## 2018-11-22 DIAGNOSIS — E1165 Type 2 diabetes mellitus with hyperglycemia: Secondary | ICD-10-CM

## 2018-11-22 LAB — URINALYSIS, ROUTINE W REFLEX MICROSCOPIC
Bilirubin Urine: NEGATIVE
Hgb urine dipstick: NEGATIVE
Ketones, ur: NEGATIVE
Nitrite: NEGATIVE
RBC / HPF: NONE SEEN (ref 0–?)
Specific Gravity, Urine: 1.01 (ref 1.000–1.030)
Total Protein, Urine: NEGATIVE
Urine Glucose: >=1000 — AB
Urobilinogen, UA: 0.2 (ref 0.0–1.0)
pH: 5.5 (ref 5.0–8.0)

## 2018-11-22 LAB — COMPREHENSIVE METABOLIC PANEL
ALT: 19 U/L (ref 0–35)
AST: 16 U/L (ref 0–37)
Albumin: 4.4 g/dL (ref 3.5–5.2)
Alkaline Phosphatase: 76 U/L (ref 39–117)
BUN: 22 mg/dL (ref 6–23)
CO2: 28 mEq/L (ref 19–32)
Calcium: 9.3 mg/dL (ref 8.4–10.5)
Chloride: 100 mEq/L (ref 96–112)
Creatinine, Ser: 1.01 mg/dL (ref 0.40–1.20)
GFR: 52.87 mL/min — ABNORMAL LOW (ref >60.00)
Glucose, Bld: 112 mg/dL — ABNORMAL HIGH (ref 70–99)
Potassium: 3.8 mEq/L (ref 3.5–5.1)
Sodium: 137 mEq/L (ref 135–145)
Total Bilirubin: 0.4 mg/dL (ref 0.2–1.2)
Total Protein: 7 g/dL (ref 6.0–8.3)

## 2018-11-22 NOTE — Patient Instructions (Addendum)
Check blood sugars on waking up 4 days a week  Also check blood sugars about 2 hours after meals and do this after different meals by rotation  Recommended blood sugar levels on waking up are 90-130 and about 2 hours after meal is 130-160  Please bring your blood sugar monitor to each visit, thank you  Have a protein like egg, cottage cheese or lean meat at Breakfast  May leave off [place]

## 2018-11-23 LAB — MICROALBUMIN / CREATININE URINE RATIO
Creatinine,U: 30.4 mg/dL
Microalb Creat Ratio: 2.3 mg/g (ref 0.0–30.0)
Microalb, Ur: <0.7 mg/dL (ref 0.0–1.9)

## 2018-11-23 LAB — FRUCTOSAMINE: Fructosamine: 267 umol/L (ref 0–285)

## 2018-12-08 ENCOUNTER — Telehealth: Payer: Self-pay | Admitting: Family Medicine

## 2018-12-08 NOTE — Telephone Encounter (Signed)
Best number [phone] Pt called stating she received a letter from [facility] stating her metformin  750 ([id number]  And  [id number] lot#)  has been recalled.  Pt wanted to know what she needed to do She could not find lot number on  Her bottle

## 2018-12-08 NOTE — Telephone Encounter (Signed)
If she calls the pharmacy they should be able to tell her if her last fill was that lot number and then she can let me know

## 2018-12-11 DIAGNOSIS — H2513 Age-related nuclear cataract, bilateral: Secondary | ICD-10-CM | POA: Diagnosis not present

## 2018-12-13 NOTE — Telephone Encounter (Signed)
Called pharmacy and they said they can't confirm if her med is part of the recall or not given her last filled date so they are just replacing everyone's med that might have been affected with meds that are not part of the recall, all she had to do is to go to pharmacy and they will switch it out for pt. Called pt and advised her of this an she will go to pharmacy and get new meds

## 2018-12-22 ENCOUNTER — Other Ambulatory Visit: Payer: Self-pay

## 2018-12-22 ENCOUNTER — Ambulatory Visit: Payer: Medicare Other | Admitting: Adult Health

## 2018-12-22 ENCOUNTER — Encounter: Payer: Self-pay | Admitting: Adult Health

## 2018-12-22 VITALS — BP 156/82 | HR 87 | Temp 97.5°F | Ht 62.5 in | Wt 157.2 lb

## 2018-12-22 DIAGNOSIS — G47411 Narcolepsy with cataplexy: Secondary | ICD-10-CM

## 2018-12-22 MED ORDER — METHYLPHENIDATE HCL 20 MG PO TABS: 10.0000 mg | ORAL_TABLET | Freq: Three times a day (TID) | ORAL | 0 refills | Status: DC

## 2018-12-22 NOTE — Progress Notes (Signed)
PATIENT: Amanda SparrowBrenda C Abrams DOB: 08-19-1940  REASON FOR VISIT: follow up HISTORY FROM: patient  HISTORY OF PRESENT ILLNESS: Today 12/22/18:  [name] is a 78 year old female with a history of narcolepsy with cataplexy.  She returns today for follow-up.  She remains on Tofranil and Ritalin for narcolepsy.  She states that she is not had any cataplectic events.  She is no longer working at [employer] 3 days a week.  She denies any trouble falling asleep while at work or driving.  Overall she feels that she is doing relatively well.  She denies any new symptoms.  She returns today for follow-up.   HISTORY 04/14/18:  [name] is a 78 year old female with a history of narcolepsy with cataplexy.  She returns today for follow-up.  She remains on tofranil for cataplexy.  She continues on Ritalin 20 mg 3 times a day for narcolepsy.  She reports that really continues to work well.  She states that she is retired but works 3 days a week at [employer].  She states that she is not falling asleep at work.  She does not fall asleep while driving.  She denies any cataplexy events.  She returns today for evaluation.   REVIEW OF SYSTEMS: Out of a complete 14 system review of symptoms, the patient complains only of the following symptoms, and all other reviewed systems are negative.  See HPI  ALLERGIES: Allergies  Allergen Reactions  . Penicillins Other (See Comments)    Has patient had a PCN reaction causing immediate rash, facial/tongue/throat swelling, SOB or lightheadedness with hypotension: Unknown Has patient had a PCN reaction causing severe rash involving mucus membranes or skin necrosis: Unknown Has patient had a PCN reaction that required hospitalization: Unknown Has patient had a PCN reaction occurring within the last 10 years: No If all of the above answers are "NO", then may proceed with Cephalosporin use.     HOME MEDICATIONS: Outpatient Medications Prior to Visit   Medication Sig Dispense Refill  . cetirizine (ZYRTEC) 10 MG tablet Take 10 mg by mouth daily as needed for allergies or rhinitis.     Marland Kitchen. empagliflozin (JARDIANCE) 25 MG TABS tablet Take 25 mg by mouth daily. 90 tablet 2  . glipiZIDE (GLUCOTROL XL) 10 MG 24 hr tablet Take 1 tablet (10 mg total) by mouth daily with breakfast. 90 tablet 3  . glucose blood test strip Use onetouch verio test strip as instructed to check blood sugar once daily. 100 each 1  . hydrochlorothiazide (HYDRODIURIL) 25 MG tablet Take 1 tablet (25 mg total) by mouth daily. 90 tablet 3  . imipramine (TOFRANIL) 50 MG tablet TAKE 1 TABLET BY MOUTH AT BEDTIME 30 tablet 5  . Insulin Glargine (LANTUS) 100 UNIT/ML Solostar Pen Start with 8 units once a day and increase as directed 15 mL 11  . Insulin Pen Needle 31G X 5 MM MISC Use for Lantus once a day 100 each 1  . levothyroxine (SYNTHROID, LEVOTHROID) 88 MCG tablet Take 1 tablet (88 mcg total) by mouth daily. 90 tablet 3  . lisinopril (PRINIVIL,ZESTRIL) 20 MG tablet Take 1 tablet by mouth once daily 90 tablet 1  . metFORMIN (GLUCOPHAGE-XR) 750 MG 24 hr tablet Take 1 tablet (750 mg total) by mouth daily with breakfast. 90 tablet 3  . methylphenidate (RITALIN) 20 MG tablet Take 1 tablet (20 mg total) by mouth 3 (three) times daily with meals. 180 tablet 0  . OneTouch Delica Lancets 30G MISC 1  each by Does not apply route See admin instructions. Use onetouch delica lancet to check blood sugar once daily. 50 each 1  . polyethylene glycol (MIRALAX / GLYCOLAX) packet Take 17 g by mouth daily as needed. 14 each 0  . Probiotic Product (ALIGN) 4 MG CAPS Take by mouth daily.    . simvastatin (ZOCOR) 80 MG tablet Take 1 tablet (80 mg total) by mouth at bedtime. 90 tablet 3   No facility-administered medications prior to visit.     PAST MEDICAL HISTORY: Past Medical History:  Diagnosis Date  . Diabetes mellitus    Type II  . Hyperlipidemia   . Hypertension   . Hypothyroidism   .  Migraine   . Narcolepsy with cataplexy   . Pneumonia 08/2001    PAST SURGICAL HISTORY: Past Surgical History:  Procedure Laterality Date  . APPENDECTOMY  age 46  . BIOPSY  05/24/2018   Procedure: BIOPSY;  Surgeon: Rush Landmark Telford Nab., MD;  Location: [place];  Service: Gastroenterology;;  . COLONOSCOPY N/A 05/24/2018   Procedure: COLONOSCOPY;  Surgeon: Irving Copas., MD;  Location: [facility];  Service: Gastroenterology;  Laterality: N/A;  . hemilaminectomy     C5-6; C6-7  . PARTIAL HYSTERECTOMY  age 63  . POSTERIOR CERVICAL FUSION/FORAMINOTOMY  02/05/1975   C5-C6-C7    FAMILY HISTORY: Family History  Problem Relation Age of Onset  . Depression Brother        suicide  . Diabetes Brother   . Heart disease Mother        MI  . Peripheral vascular disease Mother   . Hyperlipidemia Mother   . Diabetes Brother   . Sleep apnea Son   . Diabetes Son   . Sleep apnea Brother     SOCIAL HISTORY: Social History   Socioeconomic History  . Marital status: Widowed    Spouse name: Not on file  . Number of children: 3  . Years of education: GED  . Highest education level: Not on file  Occupational History  . Occupation: works 1 day a week  Social Needs  . Financial resource strain: Not on file  . Food insecurity    Worry: Not on file    Inability: Not on file  . Transportation needs    Medical: Not on file    Non-medical: Not on file  Tobacco Use  . Smoking status: Former Smoker    Quit date: 06/09/1980    Years since quitting: 38.5  . Smokeless tobacco: Never Used  Substance and Sexual Activity  . Alcohol use: No    Alcohol/week: 0.0 standard drinks  . Drug use: No  . Sexual activity: Never  Lifestyle  . Physical activity    Days per week: Not on file    Minutes per session: Not on file  . Stress: Not on file  Relationships  . Social Herbalist on phone: Not on file    Gets together: Not on file    Attends religious service: Not on  file    Active member of club or organization: Not on file    Attends meetings of clubs or organizations: Not on file    Relationship status: Not on file  . Intimate partner violence    Fear of current or ex partner: Not on file    Emotionally abused: Not on file    Physically abused: Not on file    Forced sexual activity: Not on file  Other Topics Concern  .  Not on file  Social History Narrative   Patient is widowed. and her son lives with her.   Walks for exercise.     Patient has three children.   Patient is right-handed.   Patient drinks three cups of caffeine daily.     Patient is retired.   Patient has a GED.      PHYSICAL EXAM  Vitals:   12/22/18 1433  BP: (!) 156/82  Pulse: 87  Temp: (!) 97.5 F (36.4 C)  Weight: 157 lb 3.2 oz (71.3 kg)  Height: 5' 2.5" (1.588 m)   Body mass index is 28.29 kg/m.  Generalized: Well developed, in no acute distress   Neurological examination  Mentation: Alert oriented to time, place, history taking. Follows all commands speech and language fluent Cranial nerve II-XII: Extraocular movements were full, visual field were full on confrontational test.  Uvula tongue midline. Head turning and shoulder shrug  were normal and symmetric. Motor: The motor testing reveals 5 over 5 strength of all 4 extremities. Good symmetric motor tone is noted throughout.  Sensory: Sensory testing is intact to soft touch on all 4 extremities. No evidence of extinction is noted.  Coordination: Cerebellar testing reveals good finger-nose-finger and heel-to-shin bilaterally.  Gait and station: Gait is normal.  Reflexes: Deep tendon reflexes are symmetric and normal bilaterally.   DIAGNOSTIC DATA (LABS, IMAGING, TESTING) - I reviewed patient records, labs, notes, testing and imaging myself where available.  Lab Results  Component Value Date   WBC 11.3 (H) 06/24/2018   HGB 12.4 06/24/2018   HCT 37.1 06/24/2018   MCV 88.5 06/24/2018   PLT 286.0  06/24/2018      Component Value Date/Time   NA 137 11/22/2018 1443   K 3.8 11/22/2018 1443   CL 100 11/22/2018 1443   CO2 28 11/22/2018 1443   GLUCOSE 112 (H) 11/22/2018 1443   BUN 22 11/22/2018 1443   CREATININE 1.01 11/22/2018 1443   CALCIUM 9.3 11/22/2018 1443   PROT 7.0 11/22/2018 1443   ALBUMIN 4.4 11/22/2018 1443   AST 16 11/22/2018 1443   ALT 19 11/22/2018 1443   ALKPHOS 76 11/22/2018 1443   BILITOT 0.4 11/22/2018 1443   GFRNONAA >60 05/24/2018 0454   GFRAA >60 05/24/2018 0454   Lab Results  Component Value Date   CHOL 181 06/24/2018   HDL 38.20 (L) 06/24/2018   LDLCALC 73 06/21/2014   LDLDIRECT 86.0 06/24/2018   TRIG 370.0 (H) 06/24/2018   CHOLHDL 5 06/24/2018   Lab Results  Component Value Date   HGBA1C 7.9 (A) 09/30/2018   No results found for: VITAMINB12 Lab Results  Component Value Date   TSH 1.34 03/09/2018      ASSESSMENT AND PLAN 78 y.o. year old female  has a past medical history of Diabetes mellitus, Hyperlipidemia, Hypertension, Hypothyroidism, Migraine, Narcolepsy with cataplexy, and Pneumonia (08/2001). here with :  1.  Narcolepsy with cataplexy  Overall the patient continues to do well.  She will continue on Ritalin and Tofranil.  I have advised that if her symptoms worsen or she develops new symptoms she should let us know.  She will follow-up in 6 months or sooner if needed.   I spent 15 minutes with the patient. 50% of this time was spent reviewing her plan of care   Butch PennyMegan Janecia Palau, MSN, NP-C 12/22/2018, 2:30 PM [facility] [address], [address] [place], [place] [address] [phone]

## 2018-12-22 NOTE — Patient Instructions (Signed)
Your Plan:  Continue tofranil and ritalin If your symptoms worsen or you develop new symptoms please let us know.   Thank you for coming to see [place] at [facility]. I hope we have been able to provide you high quality care today.  You may receive a patient satisfaction survey over the next few weeks. We would appreciate your feedback and comments so that we may continue to improve ourselves and the health of our patients.

## 2019-02-22 ENCOUNTER — Other Ambulatory Visit: Payer: Self-pay

## 2019-02-22 ENCOUNTER — Encounter: Payer: Self-pay | Admitting: Endocrinology

## 2019-02-22 ENCOUNTER — Ambulatory Visit: Payer: Medicare Other | Admitting: Endocrinology

## 2019-02-22 VITALS — BP 134/60 | HR 64 | Ht 62.5 in | Wt 157.6 lb

## 2019-02-22 DIAGNOSIS — E063 Autoimmune thyroiditis: Secondary | ICD-10-CM

## 2019-02-22 DIAGNOSIS — E1165 Type 2 diabetes mellitus with hyperglycemia: Secondary | ICD-10-CM | POA: Diagnosis not present

## 2019-02-22 DIAGNOSIS — Z23 Encounter for immunization: Secondary | ICD-10-CM

## 2019-02-22 LAB — POCT GLYCOSYLATED HEMOGLOBIN (HGB A1C): Hemoglobin A1C: 7.9 % — AB (ref 4.0–5.6)

## 2019-02-22 LAB — GLUCOSE, POCT (MANUAL RESULT ENTRY): POC Glucose: 121 mg/dl — AB (ref 70–99)

## 2019-02-22 NOTE — Progress Notes (Signed)
Patient ID: Amanda King, female   DOB: 10-03-1940, 78 y.o.   MRN: [id number]           Reason for Appointment: Follow-up for Type 2 Diabetes  Referring PCP: Dr. Glori Bickers   History of Present Illness:          Date of diagnosis of type 2 diabetes mellitus:  2008      Background history:  She was initially treated with metformin and subsequently glipizide added Januvia was added in 12/2016 She has had variable control with A1c as high as 10.3 done in 2015 and as low as 7.3 done in April 2016 Has generally been followed by her PCP about twice a year  Recent history:    Her A1c is again 7.9    Non-insulin hypoglycemic drugs: Jardiance 25 mg daily, glipizide ER 10 mg daily, metformin ER 750 mg daily  INSULIN doses: Lantus 10 units daily at bedtime  Current management, blood sugar patterns and problems identified:  She has been on Lantus insulin since 09/2018  She has not been checking her sugars very much as she thinks she runs out of test strips  However difficult to get a consistent blood sugar pattern on her home readings and not clear why her A1c is still consistently high despite continuing basal insulin and multiple other drugs  Her Januvia was stopped on the last visit because of her expense but she is asking about excessive cost for her Jardiance prescription.  She cannot tolerate more than 1 tablet of metformin ER  She recently has only 1 or 2 readings in the mornings not clear whether fasting readings are consistently controlled, she thinks her blood sugar was higher this morning at 166 because of eating more carbohydrates last night at dinnertime  POSTPRANDIAL readings: She has 2 readings recently which are both around 180  Glucose in the office today nonfasting is 121        Side effects from medications have been: Diarrhea from high dose metformin            Exercise:  walking about 20 minutes every other day  Glucose monitoring:  done less than 1 times a day          Glucometer:  One Touch       Blood Glucose readings by time of day and averages from meter download:  AVERAGE 167 Morning 166 Lunchtime 142-178  bedtime 182, 184  Previous readings:  PRE-MEAL Fasting Lunch Dinner Bedtime Overall  Glucose range:  84-174   88, 166  128   Mean/median:  137    136   POST-MEAL PC Breakfast PC Lunch PC Dinner  Glucose range:  84-200    Mean/median:         Dietician visit, most recent: 08/12/2018  Weight history:  Wt Readings from Last 3 Encounters:  02/22/19 157 lb 9.6 oz (71.5 kg)  12/22/18 157 lb 3.2 oz (71.3 kg)  11/22/18 156 lb 9.6 oz (71 kg)    Glycemic control:   Lab Results  Component Value Date   HGBA1C 7.9 (A) 09/30/2018   HGBA1C 8.9 (H) 06/24/2018   HGBA1C 8.0 (H) 03/09/2018   Lab Results  Component Value Date   MICROALBUR <0.7 11/22/2018   LDLCALC 73 06/21/2014   CREATININE 1.01 11/22/2018   Lab Results  Component Value Date   MICRALBCREAT 2.3 11/22/2018    Lab Results  Component Value Date   FRUCTOSAMINE 267 11/22/2018   FRUCTOSAMINE 349 (H) 08/18/2018  Office Visit on 02/22/2019  Component Date Value Ref Range Status  . POC Glucose 02/22/2019 121* 70 - 99 mg/dl Final    Allergies as of 02/22/2019      Reactions   Penicillins Other (See Comments)   Has patient had a PCN reaction causing immediate rash, facial/tongue/throat swelling, SOB or lightheadedness with hypotension: Unknown Has patient had a PCN reaction causing severe rash involving mucus membranes or skin necrosis: Unknown Has patient had a PCN reaction that required hospitalization: Unknown Has patient had a PCN reaction occurring within the last 10 years: No If all of the above answers are "NO", then may proceed with Cephalosporin use.      Medication List       Accurate as of February 22, 2019  1:38 PM. If you have any questions, ask your nurse or doctor.        Align 4 MG Caps Take by mouth daily.   cetirizine 10 MG tablet  Commonly known as: ZYRTEC Take 10 mg by mouth daily as needed for allergies or rhinitis.   empagliflozin 25 MG Tabs tablet Commonly known as: JARDIANCE Take 25 mg by mouth daily.   glipiZIDE 10 MG 24 hr tablet Commonly known as: GLUCOTROL XL Take 1 tablet (10 mg total) by mouth daily with breakfast.   glucose blood test strip Use onetouch verio test strip as instructed to check blood sugar once daily.   hydrochlorothiazide 25 MG tablet Commonly known as: HYDRODIURIL Take 1 tablet (25 mg total) by mouth daily.   imipramine 50 MG tablet Commonly known as: TOFRANIL TAKE 1 TABLET BY MOUTH AT BEDTIME   Insulin Glargine 100 UNIT/ML Solostar Pen Commonly known as: LANTUS Start with 8 units once a day and increase as directed What changed:   how much to take  how to take this  when to take this  additional instructions   Insulin Pen Needle 31G X 5 MM Misc Use for Lantus once a day   levothyroxine 88 MCG tablet Commonly known as: SYNTHROID Take 1 tablet (88 mcg total) by mouth daily.   lisinopril 20 MG tablet Commonly known as: ZESTRIL Take 1 tablet by mouth once daily   metFORMIN 750 MG 24 hr tablet Commonly known as: GLUCOPHAGE-XR Take 1 tablet (750 mg total) by mouth daily with breakfast.   methylphenidate 20 MG tablet Commonly known as: RITALIN Take 0.5 tablets (10 mg total) by mouth 3 (three) times daily with meals.   OneTouch Delica Lancets 30G Misc 1 each by Does not apply route See admin instructions. Use onetouch delica lancet to check blood sugar once daily.   polyethylene glycol 17 g packet Commonly known as: MIRALAX / GLYCOLAX Take 17 g by mouth daily as needed.   simvastatin 80 MG tablet Commonly known as: ZOCOR Take 1 tablet (80 mg total) by mouth at bedtime.       Allergies:  Allergies  Allergen Reactions  . Penicillins Other (See Comments)    Has patient had a PCN reaction causing immediate rash, facial/tongue/throat swelling, SOB or  lightheadedness with hypotension: Unknown Has patient had a PCN reaction causing severe rash involving mucus membranes or skin necrosis: Unknown Has patient had a PCN reaction that required hospitalization: Unknown Has patient had a PCN reaction occurring within the last 10 years: No If all of the above answers are "NO", then may proceed with Cephalosporin use.     Past Medical History:  Diagnosis Date  . Diabetes mellitus    Type  II  . Hyperlipidemia   . Hypertension   . Hypothyroidism   . Migraine   . Narcolepsy with cataplexy   . Pneumonia 08/2001    Past Surgical History:  Procedure Laterality Date  . APPENDECTOMY  age 26  . BIOPSY  05/24/2018   Procedure: BIOPSY;  Surgeon: Meridee ScoreMansouraty, Netty StarringGabriel Jr., MD;  [facility];  Service: Gastroenterology;;  . COLONOSCOPY N/A 05/24/2018   Procedure: COLONOSCOPY;  Surgeon: Lemar LoftyMansouraty, Gabriel Jr., MD;  [facility];  Service: Gastroenterology;  Laterality: N/A;  . hemilaminectomy     C5-6; C6-7  . PARTIAL HYSTERECTOMY  age 78  . POSTERIOR CERVICAL FUSION/FORAMINOTOMY  02/05/1975   C5-C6-C7    Family History  Problem Relation Age of Onset  . Depression Brother        suicide  . Diabetes Brother   . Heart disease Mother        MI  . Peripheral vascular disease Mother   . Hyperlipidemia Mother   . Diabetes Brother   . Sleep apnea Son   . Diabetes Son   . Sleep apnea Brother     Social History:  reports that she quit smoking about 38 years ago. She has never used smokeless tobacco. She reports that she does not drink alcohol or use drugs.   Review of Systems   Lipid history: Has been treated by her PCP with simvastatin 80 mg Has high triglycerides   Lab Results  Component Value Date   CHOL 181 06/24/2018   HDL 38.20 (L) 06/24/2018   LDLCALC 73 06/21/2014   LDLDIRECT 86.0 06/24/2018   TRIG 370.0 (H) 06/24/2018   CHOLHDL 5 06/24/2018           Hypertension: Has been treated with HCTZ, half tablet  and lisinopril by her PCP She does not monitor at home  BP Readings from Last 3 Encounters:  02/22/19 134/60  12/22/18 (!) 156/82  11/22/18 (!) 142/70    Most recent eye exam was in 10/18  Most recent foot exam:2/20   Hypothyroidism followed by PCP but is overdue for labs  Lab Results  Component Value Date   TSH 1.34 03/09/2018     LABS:  Office Visit on 02/22/2019  Component Date Value Ref Range Status  . POC Glucose 02/22/2019 121* 70 - 99 mg/dl Final    Physical Examination:  BP 134/60 (BP Location: Left Arm, Patient Position: Sitting, Cuff Size: Normal)   Pulse 64   Ht 5' 2.5" (1.588 m)   Wt 157 lb 9.6 oz (71.5 kg)   SpO2 95%   BMI 28.37 kg/m     ASSESSMENT:  Diabetes type 2 with poor control  See history of present illness for detailed discussion of current diabetes management, blood sugar patterns and problems identified  Her A1c is still 7.9  Although initially blood sugars were better with starting Lantus her level of control is not as good She is likely having high postprandial readings with blood sugars around 180 at bedtime recently Today in the office blood sugar is 121  She may do better with premixed insulin at dinnertime instead of basal insulin but she just got a supply of her Lantus insulin She is also concerned about the cost of Jardiance  HYPERTENSION: Blood pressure is relatively better today   PLAN:    Glucose testing: She was advised to start checking blood sugars more often twice a day and new prescription sent for test strips  Today discussed in detail the need  for mealtime insulin to cover postprandial spikes, action of mealtime insulin, use of the insulin pen, timing and action of the rapid acting insulin as well as starting dose and dosage titration to target the two-hour reading of under 180 She will be given a sample of Humalog KwikPen  Showed her diagrams of action of mealtime insulin and when to take the injection She  will start with 3 units for average meals and take it right before suppertime She can also take her Lantus at the same time Discussed that if her sugars are higher after supper she will need to take 4 or 5 units of Humalog especially with larger meals May also need a higher dose if eating more carbohydrate  She will check with her insurance to see if Invokana or Marcelline Deist are better covered Otherwise may consider just having her take half a tablet of Jardiance Regular walking for exercise Reduce portions of carbohydrates at dinnertime  Recheck thyroid functions as she has not had this checked for a year  Influenza vaccine given and patient handout on the vaccine given  Counseling time on subjects discussed in assessment and plan sections is over 50% of today's 25 minute visit   There are no Patient Instructions on file for this visit.     Reather Littler 02/22/2019, 1:38 PM   Note: This office note was prepared with Dragon voice recognition system technology. Any transcriptional errors that result from this process are unintentional.

## 2019-02-22 NOTE — Patient Instructions (Signed)
MEALTIME insulin: The new insulin is a fast acting insulin that works for 4 hours and needs to be taken right before eating  Start taking 3 units before suppertime daily and for any larger meals or more starchy foods take 4-5 units  Blood sugar AFTER evening meal should be at least under 180  Continue Lantus insulin and you may take it at the same time at suppertime also  Check blood sugars on waking up 3-4 days a week  Also check blood sugars about 2 hours after meals and do this after different meals by rotation  Recommended blood sugar levels on waking up are 90-130 and about 2 hours after meal is 130-160  Please bring your blood sugar monitor to each visit, thank you

## 2019-02-23 LAB — BASIC METABOLIC PANEL
BUN: 20 mg/dL (ref 6–23)
CO2: 30 mEq/L (ref 19–32)
Calcium: 9.6 mg/dL (ref 8.4–10.5)
Chloride: 98 mEq/L (ref 96–112)
Creatinine, Ser: 0.98 mg/dL (ref 0.40–1.20)
GFR: 54.7 mL/min — ABNORMAL LOW (ref >60.00)
Glucose, Bld: 98 mg/dL (ref 70–99)
Potassium: 4.2 mEq/L (ref 3.5–5.1)
Sodium: 138 mEq/L (ref 135–145)

## 2019-02-23 LAB — URINALYSIS, ROUTINE W REFLEX MICROSCOPIC
Bilirubin Urine: NEGATIVE
Hgb urine dipstick: NEGATIVE
Ketones, ur: NEGATIVE
Leukocytes,Ua: NEGATIVE
Nitrite: NEGATIVE
RBC / HPF: NONE SEEN (ref 0–?)
Specific Gravity, Urine: 1.02 (ref 1.000–1.030)
Total Protein, Urine: NEGATIVE
Urine Glucose: >=1000 — AB
Urobilinogen, UA: 0.2 (ref 0.0–1.0)
pH: 5.5 (ref 5.0–8.0)

## 2019-02-23 LAB — T4, FREE: Free T4: 1 ng/dL (ref 0.60–1.60)

## 2019-02-23 LAB — MICROALBUMIN / CREATININE URINE RATIO
Creatinine,U: 43.6 mg/dL
Microalb Creat Ratio: 1.6 mg/g (ref 0.0–30.0)
Microalb, Ur: <0.7 mg/dL (ref 0.0–1.9)

## 2019-02-23 LAB — TSH: TSH: 0.21 u[IU]/mL — ABNORMAL LOW (ref 0.35–4.50)

## 2019-02-25 ENCOUNTER — Telehealth: Payer: Self-pay | Admitting: Endocrinology

## 2019-02-25 NOTE — Telephone Encounter (Signed)
Made pt aware of levothyroxine change to 75 mcg. Pt is asking for this to be called into [facility] in [place]

## 2019-02-28 ENCOUNTER — Other Ambulatory Visit: Payer: Self-pay

## 2019-02-28 MED ORDER — LEVOTHYROXINE SODIUM 75 MCG PO TABS: 75.0000 ug | ORAL_TABLET | Freq: Every day | ORAL | 2 refills | Status: DC

## 2019-03-03 ENCOUNTER — Other Ambulatory Visit: Payer: Self-pay | Admitting: Endocrinology

## 2019-03-10 ENCOUNTER — Other Ambulatory Visit: Payer: Self-pay | Admitting: Family Medicine

## 2019-03-22 ENCOUNTER — Ambulatory Visit (INDEPENDENT_AMBULATORY_CARE_PROVIDER_SITE_OTHER): Payer: Medicare Other | Admitting: Endocrinology

## 2019-03-22 ENCOUNTER — Other Ambulatory Visit: Payer: Self-pay

## 2019-03-22 ENCOUNTER — Encounter: Payer: Self-pay | Admitting: Endocrinology

## 2019-03-22 VITALS — BP 140/80 | HR 94 | Ht 62.5 in | Wt 160.0 lb

## 2019-03-22 DIAGNOSIS — E063 Autoimmune thyroiditis: Secondary | ICD-10-CM | POA: Diagnosis not present

## 2019-03-22 DIAGNOSIS — E1165 Type 2 diabetes mellitus with hyperglycemia: Secondary | ICD-10-CM

## 2019-03-22 MED ORDER — METFORMIN HCL ER 750 MG PO TB24: 750.0000 mg | ORAL_TABLET | Freq: Two times a day (BID) | ORAL | 3 refills | Status: DC

## 2019-03-22 MED ORDER — INSULIN LISPRO (1 UNIT DIAL) 100 UNIT/ML (KWIKPEN): PEN_INJECTOR | SUBCUTANEOUS | 1 refills | Status: DC

## 2019-03-22 NOTE — Patient Instructions (Signed)
Check blood sugars on waking up 3-4  days a week  Also check blood sugars about 2 hours after meals and do this after different meals by rotation  Recommended blood sugar levels on waking up are 90-130 and about 2 hours after meal is 130-160  Please bring your blood sugar monitor to each visit, thank you  Try to eat eggs or cheese with dose currently is every other day If eating cereal may need to take 3 to 4 units of Humalog before breakfast  If your sugar in the morning stays below 100 reduce the Lantus to 8 units  Try to walk daily  Try taking Metformin 750 mg twice a day, let us know this causes any diarrhea

## 2019-03-22 NOTE — Progress Notes (Signed)
Patient ID: Amanda King, female   DOB: [birth year], 78 y.o.   MRN: [id number]           Reason for Appointment: Follow-up for Type 2 Diabetes  Referring PCP: Dr. Milinda Antisower   History of Present Illness:          Date of diagnosis of type 2 diabetes mellitus:  2008      Background history:  She was initially treated with metformin and subsequently glipizide added Januvia was added in 12/2016 She has had variable control with A1c as high as 10.3 done in 2015 and as low as 7.3 done in April 2016 Has generally been followed by her PCP about twice a year  Recent history:    Her A1c is recently 7.9    Non-insulin hypoglycemic drugs:  glipizide ER 10 mg daily, metformin ER 750 mg daily  INSULIN doses: Lantus 10 units daily at bedtime, Humalog 4 units at supper  Current management, blood sugar patterns and problems identified:  She has been started on Humalog insulin with a sample in 9/21 since she had a high A1c despite taking Lantus since she appeared to be having high readings after at least the evening meal  Despite instructions she has checked only 2 reading at bedtime in the last 30 days  Blood sugars appear to be mostly high after breakfast although not clear with readings in the mornings or after eating, she is usually eating cereal and banana at breakfast  Overall blood sugars appear to be better  She has stopped taking her Jardiance because of excessive cost  However on her own she is adding an extra half tablet of Metformin daily without any side effects now  She has gained 3 pounds  This is despite trying to walk a little  Lantus insulin has been continued unchanged  Fasting readings are mostly improved and lowest reading 84        Side effects from medications have been: Diarrhea from high dose metformin            Exercise:  walking about 20 minutes every other day  Glucose monitoring:  done less than 1 times a day         Glucometer:  One Touch       Blood  Glucose readings by time of day and averages from meter download:   PRE-MEAL Fasting Lunch Dinner Bedtime Overall  Glucose range:  84-166    132, 99   Mean/median: 138     142   POST-MEAL PC Breakfast PC Lunch PC Dinner  Glucose range:  160-200    Mean/median:       Previous results:  AVERAGE 167 Morning 166 Lunchtime 142-178  bedtime 182, 184   Dietician visit, most recent: 08/12/2018  Weight history:  Wt Readings from Last 3 Encounters:  03/22/19 160 lb (72.6 kg)  02/22/19 157 lb 9.6 oz (71.5 kg)  12/22/18 157 lb 3.2 oz (71.3 kg)    Glycemic control:   Lab Results  Component Value Date   HGBA1C 7.9 (A) 02/22/2019   HGBA1C 7.9 (A) 09/30/2018   HGBA1C 8.9 (H) 06/24/2018   Lab Results  Component Value Date   MICROALBUR <0.7 02/22/2019   LDLCALC 73 06/21/2014   CREATININE 0.98 02/22/2019   Lab Results  Component Value Date   MICRALBCREAT 1.6 02/22/2019    Lab Results  Component Value Date   FRUCTOSAMINE 267 11/22/2018   FRUCTOSAMINE 349 (H) 08/18/2018    No visits  with results within 1 Week(s) from this visit.  Latest known visit with results is:  Office Visit on 02/22/2019  Component Date Value Ref Range Status   Hemoglobin A1C 02/22/2019 7.9* 4.0 - 5.6 % Final   POC Glucose 02/22/2019 121* 70 - 99 mg/dl Final   Free T4 78/46/9629 1.00  0.60 - 1.60 ng/dL Final   Comment: Specimens from patients who are undergoing biotin therapy and /or ingesting biotin supplements may contain high levels of biotin.  The higher biotin concentration in these specimens interferes with this Free T4 assay.  Specimens that contain high levels  of biotin may cause false high results for this Free T4 assay.  Please interpret results in light of the total clinical presentation of the patient.     TSH 02/22/2019 0.21* 0.35 - 4.50 uIU/mL Final   Color, Urine 02/22/2019 YELLOW  Yellow;Lt. Yellow;Straw;Dark Yellow;Amber;Green;Red;Brown Final   APPearance 02/22/2019 CLEAR   Clear;Turbid;Slightly Cloudy;Cloudy Final   Specific Gravity, Urine 02/22/2019 1.020  1.000 - 1.030 Final   pH 02/22/2019 5.5  5.0 - 8.0 Final   Total Protein, Urine 02/22/2019 NEGATIVE  Negative Final   Urine Glucose 02/22/2019 >=1000* Negative Final   Ketones, ur 02/22/2019 NEGATIVE  Negative Final   Bilirubin Urine 02/22/2019 NEGATIVE  Negative Final   Hgb urine dipstick 02/22/2019 NEGATIVE  Negative Final   Urobilinogen, UA 02/22/2019 0.2  0.0 - 1.0 Final   Leukocytes,Ua 02/22/2019 NEGATIVE  Negative Final   Nitrite 02/22/2019 NEGATIVE  Negative Final   WBC, UA 02/22/2019 0-2/hpf  0-2/hpf Final   RBC / HPF 02/22/2019 none seen  0-2/hpf Final   Squamous Epithelial / LPF 02/22/2019 Rare(0-4/hpf)  Rare(0-4/hpf) Final   Microalb, Ur 02/22/2019 <0.7  0.0 - 1.9 mg/dL Final   Creatinine,U 52/84/1324 43.6  mg/dL Final   Microalb Creat Ratio 02/22/2019 1.6  0.0 - 30.0 mg/g Final   Sodium 02/22/2019 138  135 - 145 mEq/L Final   Potassium 02/22/2019 4.2  3.5 - 5.1 mEq/L Final   Chloride 02/22/2019 98  96 - 112 mEq/L Final   CO2 02/22/2019 30  19 - 32 mEq/L Final   Glucose, Bld 02/22/2019 98  70 - 99 mg/dL Final   BUN 40/03/2724 20  6 - 23 mg/dL Final   Creatinine, Ser 02/22/2019 0.98  0.40 - 1.20 mg/dL Final   Calcium 36/64/4034 9.6  8.4 - 10.5 mg/dL Final   GFR 74/25/9563 54.70* >60.00 mL/min Final    Allergies as of 03/22/2019      Reactions   Penicillins Other (See Comments)   Has patient had a PCN reaction causing immediate rash, facial/tongue/throat swelling, SOB or lightheadedness with hypotension: Unknown Has patient had a PCN reaction causing severe rash involving mucus membranes or skin necrosis: Unknown Has patient had a PCN reaction that required hospitalization: Unknown Has patient had a PCN reaction occurring within the last 10 years: No If all of the above answers are "NO", then may proceed with Cephalosporin use.      Medication List        Accurate as of March 22, 2019  1:50 PM. If you have any questions, ask your nurse or doctor.        STOP taking these medications   empagliflozin 25 MG Tabs tablet Commonly known as: JARDIANCE Stopped by: Reather Littler, MD     TAKE these medications   Align 4 MG Caps Take by mouth daily.   cetirizine 10 MG tablet Commonly known as: ZYRTEC Take 10  mg by mouth daily as needed for allergies or rhinitis.   glipiZIDE 10 MG 24 hr tablet Commonly known as: GLUCOTROL XL Take 1 tablet (10 mg total) by mouth daily with breakfast.   glucose blood test strip Use onetouch verio test strip as instructed to check blood sugar once daily.   hydrochlorothiazide 25 MG tablet Commonly known as: HYDRODIURIL Take 1 tablet (25 mg total) by mouth daily. What changed:   how much to take  additional instructions   imipramine 50 MG tablet Commonly known as: TOFRANIL TAKE 1 TABLET BY MOUTH AT BEDTIME   Insulin Glargine 100 UNIT/ML Solostar Pen Commonly known as: LANTUS Start with 8 units once a day and increase as directed What changed:   how much to take  how to take this  when to take this  additional instructions   Insulin Pen Needle 31G X 5 MM Misc Use for Lantus once a day   levothyroxine 75 MCG tablet Commonly known as: SYNTHROID Take 1 tablet (75 mcg total) by mouth daily before breakfast. Take 1 tablet by mouth once daily.   lisinopril 20 MG tablet Commonly known as: ZESTRIL Take 1 tablet by mouth once daily   metFORMIN 750 MG 24 hr tablet Commonly known as: GLUCOPHAGE-XR Take 1 tablet (750 mg total) by mouth daily with breakfast. What changed: additional instructions   methylphenidate 20 MG tablet Commonly known as: RITALIN Take 0.5 tablets (10 mg total) by mouth 3 (three) times daily with meals.   OneTouch Delica Lancets 40J Misc 1 each by Does not apply route See admin instructions. Use onetouch delica lancet to check blood sugar once daily.   polyethylene  glycol 17 g packet Commonly known as: MIRALAX / GLYCOLAX Take 17 g by mouth daily as needed.   simvastatin 80 MG tablet Commonly known as: ZOCOR Take 1 tablet (80 mg total) by mouth at bedtime.       Allergies:  Allergies  Allergen Reactions   Penicillins Other (See Comments)    Has patient had a PCN reaction causing immediate rash, facial/tongue/throat swelling, SOB or lightheadedness with hypotension: Unknown Has patient had a PCN reaction causing severe rash involving mucus membranes or skin necrosis: Unknown Has patient had a PCN reaction that required hospitalization: Unknown Has patient had a PCN reaction occurring within the last 10 years: No If all of the above answers are "NO", then may proceed with Cephalosporin use.     Past Medical History:  Diagnosis Date   Diabetes mellitus    Type II   Hyperlipidemia    Hypertension    Hypothyroidism    Migraine    Narcolepsy with cataplexy    Pneumonia 08/2001    Past Surgical History:  Procedure Laterality Date   APPENDECTOMY  age 63   BIOPSY  05/24/2018   Procedure: BIOPSY;  Surgeon: Irving Copas., MD;  Location: [facility];  Service: Gastroenterology;;   COLONOSCOPY N/A 05/24/2018   Procedure: COLONOSCOPY;  Surgeon: Irving Copas., MD;  Location: [facility];  Service: Gastroenterology;  Laterality: N/A;   hemilaminectomy     C5-6; C6-7   PARTIAL HYSTERECTOMY  age 89   POSTERIOR CERVICAL FUSION/FORAMINOTOMY  02/05/1975   C5-C6-C7    Family History  Problem Relation Age of Onset   Depression Brother        suicide   Diabetes Brother    Heart disease Mother        MI   Peripheral vascular disease Mother  Hyperlipidemia Mother    Diabetes Brother    Sleep apnea Son    Diabetes Son    Sleep apnea Brother     Social History:  reports that she quit smoking about 38 years ago. She has never used smokeless tobacco. She reports that she does not drink alcohol or use  drugs.   Review of Systems   Lipid history: Has been treated by her PCP with simvastatin 80 mg Has high triglycerides   Lab Results  Component Value Date   CHOL 181 06/24/2018   HDL 38.20 (L) 06/24/2018   LDLCALC 73 06/21/2014   LDLDIRECT 86.0 06/24/2018   TRIG 370.0 (H) 06/24/2018   CHOLHDL 5 06/24/2018           Hypertension: Has been treated with HCTZ, half tablet and lisinopril by her PCP HCTZ was reduced with starting Jardiance and has not been increased back again  She does not monitor at home  BP Readings from Last 3 Encounters:  03/22/19 140/80  02/22/19 134/60  12/22/18 (!) 156/82    Most recent eye exam was in 8/20  Most recent foot exam:2/20   Hypothyroidism: Since her TSH was low her dose has been reduced to 75 mcg levothyroxine. Does not complain of any unusual fatigue now  Lab Results  Component Value Date   TSH 0.21 (L) 02/22/2019   TSH 1.34 03/09/2018   TSH 2.04 08/13/2016   FREET4 1.00 02/22/2019        LABS:  No visits with results within 1 Week(s) from this visit.  Latest known visit with results is:  Office Visit on 02/22/2019  Component Date Value Ref Range Status   Hemoglobin A1C 02/22/2019 7.9* 4.0 - 5.6 % Final   POC Glucose 02/22/2019 121* 70 - 99 mg/dl Final   Free T4 09/81/1914 1.00  0.60 - 1.60 ng/dL Final   Comment: Specimens from patients who are undergoing biotin therapy and /or ingesting biotin supplements may contain high levels of biotin.  The higher biotin concentration in these specimens interferes with this Free T4 assay.  Specimens that contain high levels  of biotin may cause false high results for this Free T4 assay.  Please interpret results in light of the total clinical presentation of the patient.     TSH 02/22/2019 0.21* 0.35 - 4.50 uIU/mL Final   Color, Urine 02/22/2019 YELLOW  Yellow;Lt. Yellow;Straw;Dark Yellow;Amber;Green;Red;Brown Final   APPearance 02/22/2019 CLEAR  Clear;Turbid;Slightly  Cloudy;Cloudy Final   Specific Gravity, Urine 02/22/2019 1.020  1.000 - 1.030 Final   pH 02/22/2019 5.5  5.0 - 8.0 Final   Total Protein, Urine 02/22/2019 NEGATIVE  Negative Final   Urine Glucose 02/22/2019 >=1000* Negative Final   Ketones, ur 02/22/2019 NEGATIVE  Negative Final   Bilirubin Urine 02/22/2019 NEGATIVE  Negative Final   Hgb urine dipstick 02/22/2019 NEGATIVE  Negative Final   Urobilinogen, UA 02/22/2019 0.2  0.0 - 1.0 Final   Leukocytes,Ua 02/22/2019 NEGATIVE  Negative Final   Nitrite 02/22/2019 NEGATIVE  Negative Final   WBC, UA 02/22/2019 0-2/hpf  0-2/hpf Final   RBC / HPF 02/22/2019 none seen  0-2/hpf Final   Squamous Epithelial / LPF 02/22/2019 Rare(0-4/hpf)  Rare(0-4/hpf) Final   Microalb, Ur 02/22/2019 <0.7  0.0 - 1.9 mg/dL Final   Creatinine,U 78/29/5621 43.6  mg/dL Final   Microalb Creat Ratio 02/22/2019 1.6  0.0 - 30.0 mg/g Final   Sodium 02/22/2019 138  135 - 145 mEq/L Final   Potassium 02/22/2019 4.2  3.5 -  5.1 mEq/L Final   Chloride 02/22/2019 98  96 - 112 mEq/L Final   CO2 02/22/2019 30  19 - 32 mEq/L Final   Glucose, Bld 02/22/2019 98  70 - 99 mg/dL Final   BUN 16/03/9603 20  6 - 23 mg/dL Final   Creatinine, Ser 02/22/2019 0.98  0.40 - 1.20 mg/dL Final   Calcium 54/02/8118 9.6  8.4 - 10.5 mg/dL Final   GFR 14/78/2956 54.70* >60.00 mL/min Final    Physical Examination:  BP 140/80 (BP Location: Left Arm, Patient Position: Sitting, Cuff Size: Normal)    Pulse 94    Ht 5' 2.5" (1.588 m)    Wt 160 lb (72.6 kg)    SpO2 97%    BMI 28.80 kg/m     ASSESSMENT:  Diabetes type 2 with poor control  See history of present illness for detailed discussion of current diabetes management, blood sugar patterns and problems identified  Her A1c is last 7.9  Her blood sugars are gradually improving With adding only 4 units of Humalog her blood sugars appear to be fairly good but has no checking of readings after supper Also after breakfast  blood sugars are running high as 200 likely to be from eating cereal and not taking insulin Also may be relatively higher with stopping Jardiance  She has difficulty losing weight  HYPERTENSION: Blood pressure is fairly good even with taking half tablet of HCTZ   Hypothyroidism: We will recheck labs on her next visit since she just had her dose reduced about 4 weeks ago  PLAN:    Glucose testing: She was reminded to start checking blood sugars after meals including after evening meal and some after breakfast or lunch also She will continue Humalog unchanged If her morning sugars start getting to be below 100 she will can reduce her Lantus to 8 units  Get more protein at breakfast and not eat cereal daily She can give a trial to Metformin ER 750 mg twice daily and if tolerated can continue  Check A1c in the next visit  Detailed instructions given for the changes  Patient Instructions  Check blood sugars on waking up 3-4  days a week  Also check blood sugars about 2 hours after meals and do this after different meals by rotation  Recommended blood sugar levels on waking up are 90-130 and about 2 hours after meal is 130-160  Please bring your blood sugar monitor to each visit, thank you  Try to eat eggs or cheese with dose currently is every other day If eating cereal may need to take 3 to 4 units of Humalog before breakfast  If your sugar in the morning stays below 100 reduce the Lantus to 8 units  Try to walk daily  Try taking Metformin 750 mg twice a day, let us know this causes any diarrhea   Total visit time for evaluation and management of multiple problems and counseling =25 minutes   Reather Littler 03/22/2019, 1:50 PM   Note: This office note was prepared with Dragon voice recognition system technology. Any transcriptional errors that result from this process are unintentional.

## 2019-03-24 ENCOUNTER — Other Ambulatory Visit: Payer: Self-pay | Admitting: Endocrinology

## 2019-03-28 ENCOUNTER — Telehealth: Payer: Self-pay

## 2019-03-28 ENCOUNTER — Other Ambulatory Visit: Payer: Self-pay

## 2019-03-28 MED ORDER — BD PEN NEEDLE MINI U/F 31G X 5 MM MISC: 2 refills | Status: DC

## 2019-03-28 NOTE — Telephone Encounter (Signed)
Rx sent 

## 2019-03-28 NOTE — Telephone Encounter (Signed)
Patient needing a new prescription faxed into pharmacy for her Insulin Pen Needle (B-D UF III MINI PEN NEEDLES) 31G X 5 MM  States she needs it tio say she take two shots a day now   Please advise

## 2019-03-29 ENCOUNTER — Telehealth: Payer: Self-pay | Admitting: Family Medicine

## 2019-03-29 DIAGNOSIS — E039 Hypothyroidism, unspecified: Secondary | ICD-10-CM

## 2019-03-29 DIAGNOSIS — I10 Essential (primary) hypertension: Secondary | ICD-10-CM

## 2019-03-29 DIAGNOSIS — E785 Hyperlipidemia, unspecified: Secondary | ICD-10-CM

## 2019-03-29 DIAGNOSIS — E1169 Type 2 diabetes mellitus with other specified complication: Secondary | ICD-10-CM

## 2019-03-29 NOTE — Telephone Encounter (Signed)
-----   Message from Cloyd Stagers, RT sent at 03/25/2019  2:19 PM EDT ----- Regarding: Lab Orders for Wednesday 10.21.2020 Please place lab orders for Wednesday 10.21.2020, office visit for physical on Wednesday 10.28.2020 Thank you, Dyke Maes RT(R)

## 2019-03-30 ENCOUNTER — Other Ambulatory Visit (INDEPENDENT_AMBULATORY_CARE_PROVIDER_SITE_OTHER): Payer: Medicare Other

## 2019-03-30 ENCOUNTER — Other Ambulatory Visit: Payer: Self-pay

## 2019-03-30 ENCOUNTER — Ambulatory Visit: Payer: Medicare Other

## 2019-03-30 DIAGNOSIS — E1169 Type 2 diabetes mellitus with other specified complication: Secondary | ICD-10-CM

## 2019-03-30 DIAGNOSIS — I10 Essential (primary) hypertension: Secondary | ICD-10-CM

## 2019-03-30 DIAGNOSIS — E039 Hypothyroidism, unspecified: Secondary | ICD-10-CM

## 2019-03-30 DIAGNOSIS — E785 Hyperlipidemia, unspecified: Secondary | ICD-10-CM

## 2019-03-30 LAB — COMPREHENSIVE METABOLIC PANEL
ALT: 27 U/L (ref 0–35)
AST: 20 U/L (ref 0–37)
Albumin: 4.5 g/dL (ref 3.5–5.2)
Alkaline Phosphatase: 83 U/L (ref 39–117)
BUN: 21 mg/dL (ref 6–23)
CO2: 30 mEq/L (ref 19–32)
Calcium: 9 mg/dL (ref 8.4–10.5)
Chloride: 101 mEq/L (ref 96–112)
Creatinine, Ser: 0.92 mg/dL (ref 0.40–1.20)
GFR: 58.82 mL/min — ABNORMAL LOW (ref >60.00)
Glucose, Bld: 116 mg/dL — ABNORMAL HIGH (ref 70–99)
Potassium: 4.3 mEq/L (ref 3.5–5.1)
Sodium: 139 mEq/L (ref 135–145)
Total Bilirubin: 0.4 mg/dL (ref 0.2–1.2)
Total Protein: 6.9 g/dL (ref 6.0–8.3)

## 2019-03-30 LAB — CBC WITH DIFFERENTIAL/PLATELET
Basophils Absolute: 0 10*3/uL (ref 0.0–0.1)
Basophils Relative: 0.4 % (ref 0.0–3.0)
Eosinophils Absolute: 0.1 10*3/uL (ref 0.0–0.7)
Eosinophils Relative: 1.3 % (ref 0.0–5.0)
HCT: 38.4 % (ref 36.0–46.0)
Hemoglobin: 12.8 g/dL (ref 12.0–15.0)
Lymphocytes Relative: 27.1 % (ref 12.0–46.0)
Lymphs Abs: 2.4 10*3/uL (ref 0.7–4.0)
MCHC: 33.3 g/dL (ref 30.0–36.0)
MCV: 90.2 fl (ref 78.0–100.0)
Monocytes Absolute: 0.5 10*3/uL (ref 0.1–1.0)
Monocytes Relative: 5.5 % (ref 3.0–12.0)
Neutro Abs: 5.8 10*3/uL (ref 1.4–7.7)
Neutrophils Relative %: 65.7 % (ref 43.0–77.0)
Platelets: 262 10*3/uL (ref 150.0–400.0)
RBC: 4.26 Mil/uL (ref 3.87–5.11)
RDW: 13.8 % (ref 11.5–15.5)
WBC: 8.7 10*3/uL (ref 4.0–10.5)

## 2019-03-30 LAB — LIPID PANEL
Cholesterol: 186 mg/dL (ref 0–200)
HDL: 44.3 mg/dL (ref >39.00)
NonHDL: 141.85
Total CHOL/HDL Ratio: 4
Triglycerides: 229 mg/dL — ABNORMAL HIGH (ref 0.0–149.0)
VLDL: 45.8 mg/dL — ABNORMAL HIGH (ref 0.0–40.0)

## 2019-03-30 LAB — LDL CHOLESTEROL, DIRECT: Direct LDL: 94 mg/dL

## 2019-03-30 LAB — TSH: TSH: 0.94 u[IU]/mL (ref 0.35–4.50)

## 2019-03-31 ENCOUNTER — Ambulatory Visit (INDEPENDENT_AMBULATORY_CARE_PROVIDER_SITE_OTHER): Payer: Medicare Other

## 2019-03-31 VITALS — BP 140/80 | HR 94 | Wt 158.0 lb

## 2019-03-31 DIAGNOSIS — Z0000 Encounter for general adult medical examination without abnormal findings: Secondary | ICD-10-CM | POA: Diagnosis not present

## 2019-03-31 NOTE — Progress Notes (Signed)
PCP notes:  Health Maintenance: declined Shingrix, declined mammogram    Abnormal Screenings: none    Patient concerns: none    Nurse concerns: none    Next PCP appt: 04/06/2019 @ 8:30 am

## 2019-03-31 NOTE — Progress Notes (Signed)
Subjective:   Amanda King is a 78 y.o. female who presents for Medicare Annual (Subsequent) preventive examination.  Review of Systems:    This visit is being conducted through telemedicine via telephone at the nurse health advisor's home address due to the COVID-19 pandemic. This patient has given me verbal consent via doximity to conduct this visit, patient states they are participating from their home address. Patient and myself are on the telephone call. There is no referral for this visit. Some vital signs may be absent or patient reported.    Patient identification: identified by name, DOB, and current address   Cardiac Risk Factors include: advanced age (>6men, >35 women);diabetes mellitus;hypertension;dyslipidemia     Objective:     Vitals: BP 140/80   Pulse 94   Wt 158 lb (71.7 kg)   BMI 28.44 kg/m   Body mass index is 28.44 kg/m.  Advanced Directives 03/31/2019 08/12/2018 05/17/2018 05/17/2018 03/09/2018 02/19/2016  Does Patient Have a Medical Advance Directive? No No - No No No  Would patient like information on creating a medical advance directive? No - Patient declined Yes (MAU/Ambulatory/Procedural Areas - Information given) No - Patient declined - No - Patient declined Yes - Transport planner given    Tobacco Social History   Tobacco Use  Smoking Status Former Smoker  . Quit date: 06/09/1980  . Years since quitting: 38.8  Smokeless Tobacco Never Used     Counseling given: Not Answered   Clinical Intake:  Pre-visit preparation completed: Yes  Pain : No/denies pain     Nutritional Risks: None Diabetes: Yes CBG done?: No Did pt. bring in CBG monitor from home?: No  How often do you need to have someone help you when you read instructions, pamphlets, or other written materials from your doctor or pharmacy?: 1 - Never What is the last grade level you completed in school?: GED  Interpreter Needed?: No  Information entered by :: CJohnson, LPN   Past Medical History:  Diagnosis Date  . Diabetes mellitus    Type II  . Hyperlipidemia   . Hypertension   . Hypothyroidism   . Migraine   . Narcolepsy with cataplexy   . Pneumonia 08/2001   Past Surgical History:  Procedure Laterality Date  . APPENDECTOMY  age 55  . BIOPSY  05/24/2018   Procedure: BIOPSY;  Surgeon: Meridee Score Netty Starring., MD;  Location: [facility] ENDOSCOPY;  Service: Gastroenterology;;  . COLONOSCOPY N/A 05/24/2018   Procedure: COLONOSCOPY;  Surgeon: Lemar Lofty., MD;  Location: [facility] ENDOSCOPY;  Service: Gastroenterology;  Laterality: N/A;  . hemilaminectomy     C5-6; C6-7  . PARTIAL HYSTERECTOMY  age 11  . POSTERIOR CERVICAL FUSION/FORAMINOTOMY  02/05/1975   C5-C6-C7   Family History  Problem Relation Age of Onset  . Depression Brother        suicide  . Diabetes Brother   . Heart disease Mother        MI  . Peripheral vascular disease Mother   . Hyperlipidemia Mother   . Diabetes Brother   . Sleep apnea Son   . Diabetes Son   . Sleep apnea Brother    Social History   Socioeconomic History  . Marital status: Widowed    Spouse name: Not on file  . Number of children: 3  . Years of education: GED  . Highest education level: Not on file  Occupational History  . Occupation: works 1 day a week  Social Needs  . Physicist, medical  strain: Not hard at all  . Food insecurity    Worry: Never true    Inability: Never true  . Transportation needs    Medical: No    Non-medical: No  Tobacco Use  . Smoking status: Former Smoker    Quit date: 06/09/1980    Years since quitting: 38.8  . Smokeless tobacco: Never Used  Substance and Sexual Activity  . Alcohol use: No    Alcohol/week: 0.0 standard drinks  . Drug use: No  . Sexual activity: Never  Lifestyle  . Physical activity    Days per week: 0 days    Minutes per session: 0 min  . Stress: Not at all  Relationships  . Social Musicianconnections    Talks on phone: Not on file    Gets together: Not on  file    Attends religious service: Not on file    Active member of club or organization: Not on file    Attends meetings of clubs or organizations: Not on file    Relationship status: Not on file  Other Topics Concern  . Not on file  Social History Narrative   Patient is widowed. and her son lives with her.   Walks for exercise.     Patient has three children.   Patient is right-handed.   Patient drinks three cups of caffeine daily.     Patient is retired.   Patient has a GED.    Outpatient Encounter Medications as of 03/31/2019  Medication Sig  . cetirizine (ZYRTEC) 10 MG tablet Take 10 mg by mouth daily as needed for allergies or rhinitis.   Marland Kitchen. glipiZIDE (GLUCOTROL XL) 10 MG 24 hr tablet Take 1 tablet (10 mg total) by mouth daily with breakfast.  . glucose blood test strip Use onetouch verio test strip as instructed to check blood sugar once daily.  . hydrochlorothiazide (HYDRODIURIL) 25 MG tablet Take 1 tablet (25 mg total) by mouth daily. (Patient taking differently: Take 12.5 mg by mouth daily. Take 1/2 tablet by mouth once daily.)  . imipramine (TOFRANIL) 50 MG tablet TAKE 1 TABLET BY MOUTH AT BEDTIME  . Insulin Glargine (LANTUS) 100 UNIT/ML Solostar Pen Start with 8 units once a day and increase as directed (Patient taking differently: Inject 10 Units into the skin at bedtime. Inject 10 units under the skin once daily.)  . insulin lispro (HUMALOG KWIKPEN) 100 UNIT/ML KwikPen 4 units twice daily before meals  . Insulin Pen Needle (B-D UF III MINI PEN NEEDLES) 31G X 5 MM MISC Use to inject insulin twice daily.  Marland Kitchen. levothyroxine (SYNTHROID) 75 MCG tablet Take 1 tablet (75 mcg total) by mouth daily before breakfast. Take 1 tablet by mouth once daily.  Marland Kitchen. lisinopril (ZESTRIL) 20 MG tablet Take 1 tablet by mouth once daily  . metFORMIN (GLUCOPHAGE-XR) 750 MG 24 hr tablet Take 1 tablet (750 mg total) by mouth 2 (two) times daily.  . methylphenidate (RITALIN) 20 MG tablet Take 0.5 tablets  (10 mg total) by mouth 3 (three) times daily with meals.  Letta Pate. OneTouch Delica Lancets 30G MISC 1 each by Does not apply route See admin instructions. Use onetouch delica lancet to check blood sugar once daily.  . polyethylene glycol (MIRALAX / GLYCOLAX) packet Take 17 g by mouth daily as needed.  . Probiotic Product (ALIGN) 4 MG CAPS Take by mouth daily.  . simvastatin (ZOCOR) 80 MG tablet Take 1 tablet (80 mg total) by mouth at bedtime.   No facility-administered encounter  medications on file as of 03/31/2019.     Activities of Daily Living In your present state of health, do you have any difficulty performing the following activities: 03/31/2019 05/18/2018  Hearing? Y -  Comment wears hearing aids -  Vision? N -  Difficulty concentrating or making decisions? N -  Walking or climbing stairs? N -  Dressing or bathing? N -  Doing errands, shopping? N N  Preparing Food and eating ? N -  Using the Toilet? N -  In the past six months, have you accidently leaked urine? N -  Do you have problems with loss of bowel control? N -  Managing your Medications? N -  Managing your Finances? N -  Housekeeping or managing your Housekeeping? N -  Some recent data might be hidden    Patient Care Team: Tower, Audrie Gallus, MD as PCP - General Dohmeier, Porfirio Mylar, MD as Consulting Physician (Neurology) Hanley Ben, MD as Consulting Physician (Audiology)    Assessment:   This is a routine wellness examination for Nylah.  Exercise Activities and Dietary recommendations Current Exercise Habits: Home exercise routine, Type of exercise: walking, Time (Minutes): 30, Frequency (Times/Week): 4, Weekly Exercise (Minutes/Week): 120, Intensity: Mild, Exercise limited by: None identified  Goals    . Patient Stated     Starting 03/09/2018, I will continue to take medications as prescribed.     . Patient Stated     03/31/2019, I will maintain and continue medications as prescribed.        Fall Risk Fall  Risk  03/31/2019 08/12/2018 03/09/2018 07/20/2017 02/19/2016  Falls in the past year? 0 0 No No No  Number falls in past yr: 0 - - - -  Injury with Fall? 0 - - - -  Risk for fall due to : Medication side effect - - - -  Follow up Falls evaluation completed;Falls prevention discussed - - - -   Is the patient's home free of loose throw rugs in walkways, pet beds, electrical cords, etc?   yes      Grab bars in the bathroom? no      Handrails on the stairs?   yes      Adequate lighting?   yes  Timed Get Up and Go performed: N/A  Depression Screen PHQ 2/9 Scores 03/31/2019 08/12/2018 03/09/2018 07/20/2017  PHQ - 2 Score 0 0 0 0  PHQ- 9 Score 0 - 0 -     Cognitive Function MMSE - Mini Mental State Exam 03/31/2019 03/09/2018 02/19/2016  Orientation to time 5 5 5   Orientation to Place 5 5 5   Registration 3 3 3   Attention/ Calculation 5 0 0  Recall 3 3 3   Language- name 2 objects - 0 0  Language- repeat 1 1 1   Language- follow 3 step command - 3 3  Language- read & follow direction - 0 0  Write a sentence - 0 0  Copy design - 0 0  Total score - 20 20   Mini Cog  Mini-Cog screen was completed. Maximum score is 22. A value of 0 denotes this part of the MMSE was not completed or the patient failed this part of the Mini-Cog screening.   Montreal Cognitive Assessment  06/26/2015  Visuospatial/ Executive (0/5) 5  Naming (0/3) 2  Attention: Read list of digits (0/2) 2  Attention: Read list of letters (0/1) 1  Attention: Serial 7 subtraction starting at 100 (0/3) 2  Language: Repeat phrase (0/2) 1  Language : Fluency (0/1) 1  Abstraction (0/2) 2  Delayed Recall (0/5) 4  Orientation (0/6) 6  Total 26      Immunization History  Administered Date(s) Administered  . Fluad Quad(high Dose 65+) 02/22/2019  . Influenza Split 03/10/2011  . Influenza Whole 06/09/2002, 04/09/2007, 03/02/2008, 03/23/2009, 04/09/2010  . Influenza, High Dose Seasonal PF 05/13/2017, 03/09/2018  . Influenza,inj,Quad  PF,6+ Mos 04/30/2015, 02/15/2016  . Influenza-Unspecified 03/07/2014  . Pneumococcal Conjugate-13 08/03/2015  . Pneumococcal Polysaccharide-23 06/09/2002, 03/02/2008  . Td 04/25/1997  . Tdap 07/01/2011    Qualifies for Shingles Vaccine? Yes  Screening Tests Health Maintenance  Topic Date Due  . FOOT EXAM  07/20/2019  . HEMOGLOBIN A1C  08/22/2019  . OPHTHALMOLOGY EXAM  12/08/2019  . TETANUS/TDAP  06/30/2021  . INFLUENZA VACCINE  Completed  . DEXA SCAN  Completed  . PNA vac Low Risk Adult  Completed    Cancer Screenings: Lung: Low Dose CT Chest recommended if Age 22-80 years, 30 pack-year currently smoking OR have quit w/in 15years. Patient does not qualify. Breast:  Up to date on Mammogram? No, Patient declined   Up to date of Bone Density/Dexa? Yes, completed 09/21/2013 Colorectal: completed 05/24/2018  Additional Screenings:  Hepatitis C Screening: N/A     Plan:   Patient will maintain and continue medications as prescribed.    I have personally reviewed and noted the following in the patient's chart:   . Medical and social history . Use of alcohol, tobacco or illicit drugs  . Current medications and supplements . Functional ability and status . Nutritional status . Physical activity . Advanced directives . List of other physicians . Hospitalizations, surgeries, and ER visits in previous 12 months . Vitals . Screenings to include cognitive, depression, and falls . Referrals and appointments  In addition, I have reviewed and discussed with patient certain preventive protocols, quality metrics, and best practice recommendations. A written personalized care plan for preventive services as well as general preventive health recommendations were provided to patient.     Andrez Grime, LPN  [id number]

## 2019-03-31 NOTE — Patient Instructions (Signed)
Amanda King , Thank you for taking time to come for your Medicare Wellness Visit. I appreciate your ongoing commitment to your health goals. Please review the following plan we discussed and let me know if I can assist you in the future.   Screening recommendations/referrals: Colonoscopy: up to date, completed 05/24/2018 Mammogram: declined Bone Density: up to date, completed 09/21/2013 Recommended yearly ophthalmology/optometry visit for glaucoma screening and checkup Recommended yearly dental visit for hygiene and checkup  Vaccinations: Influenza vaccine: up to date, completed 02/22/2019 Pneumococcal vaccine: Completed series Tdap vaccine: up to date, completed 07/01/2011 Shingles vaccine: declined    Advanced directives: Advance directive discussed with you today. Even though you declined this today please call our office should you change your mind and we can give you the proper paperwork for you to fill out.  Conditions/risks identified: diabetes, hypertension, hyperlipidemia   Next appointment: 04/06/2019 @ 8:30 am    Preventive Care 51 Years and Older, Female Preventive care refers to lifestyle choices and visits with your health care provider that can promote health and wellness. What does preventive care include?  A yearly physical exam. This is also called an annual well check.  Dental exams once or twice a year.  Routine eye exams. Ask your health care provider how often you should have your eyes checked.  Personal lifestyle choices, including:  Daily care of your teeth and gums.  Regular physical activity.  Eating a healthy diet.  Avoiding tobacco and drug use.  Limiting alcohol use.  Practicing safe sex.  Taking low-dose aspirin every day.  Taking vitamin and mineral supplements as recommended by your health care provider. What happens during an annual well check? The services and screenings done by your health care provider during your annual well check  will depend on your age, overall health, lifestyle risk factors, and family history of disease. Counseling  Your health care provider may ask you questions about your:  Alcohol use.  Tobacco use.  Drug use.  Emotional well-being.  Home and relationship well-being.  Sexual activity.  Eating habits.  History of falls.  Memory and ability to understand (cognition).  Work and work Statistician.  Reproductive health. Screening  You may have the following tests or measurements:  Height, weight, and BMI.  Blood pressure.  Lipid and cholesterol levels. These may be checked every 5 years, or more frequently if you are over 9 years old.  Skin check.  Lung cancer screening. You may have this screening every year starting at age 19 if you have a 30-pack-year history of smoking and currently smoke or have quit within the past 15 years.  Fecal occult blood test (FOBT) of the stool. You may have this test every year starting at age 32.  Flexible sigmoidoscopy or colonoscopy. You may have a sigmoidoscopy every 5 years or a colonoscopy every 10 years starting at age 40.  Hepatitis C blood test.  Hepatitis B blood test.  Sexually transmitted disease (STD) testing.  Diabetes screening. This is done by checking your blood sugar (glucose) after you have not eaten for a while (fasting). You may have this done every 1-3 years.  Bone density scan. This is done to screen for osteoporosis. You may have this done starting at age 67.  Mammogram. This may be done every 1-2 years. Talk to your health care provider about how often you should have regular mammograms. Talk with your health care provider about your test results, treatment options, and if necessary, the need for  more tests. Vaccines  Your health care provider may recommend certain vaccines, such as:  Influenza vaccine. This is recommended every year.  Tetanus, diphtheria, and acellular pertussis (Tdap, Td) vaccine. You may  need a Td booster every 10 years.  Zoster vaccine. You may need this after age 58.  Pneumococcal 13-valent conjugate (PCV13) vaccine. One dose is recommended after age 43.  Pneumococcal polysaccharide (PPSV23) vaccine. One dose is recommended after age 45. Talk to your health care provider about which screenings and vaccines you need and how often you need them. This information is not intended to replace advice given to you by your health care provider. Make sure you discuss any questions you have with your health care provider. Document Released: 06/22/2015 Document Revised: 02/13/2016 Document Reviewed: 03/27/2015 Elsevier Interactive Patient Education  2017 [place] Prevention in the Home Falls can cause injuries. They can happen to people of all ages. There are many things you can do to make your home safe and to help prevent falls. What can I do on the outside of my home?  Regularly fix the edges of walkways and driveways and fix any cracks.  Remove anything that might make you trip as you walk through a door, such as a raised step or threshold.  Trim any bushes or trees on the path to your home.  Use bright outdoor lighting.  Clear any walking paths of anything that might make someone trip, such as rocks or tools.  Regularly check to see if handrails are loose or broken. Make sure that both sides of any steps have handrails.  Any raised decks and porches should have guardrails on the edges.  Have any leaves, snow, or ice cleared regularly.  Use sand or salt on walking paths during winter.  Clean up any spills in your garage right away. This includes oil or grease spills. What can I do in the bathroom?  Use night lights.  Install grab bars by the toilet and in the tub and shower. Do not use towel bars as grab bars.  Use non-skid mats or decals in the tub or shower.  If you need to sit down in the shower, use a plastic, non-slip stool.  Keep the floor  dry. Clean up any water that spills on the floor as soon as it happens.  Remove soap buildup in the tub or shower regularly.  Attach bath mats securely with double-sided non-slip rug tape.  Do not have throw rugs and other things on the floor that can make you trip. What can I do in the bedroom?  Use night lights.  Make sure that you have a light by your bed that is easy to reach.  Do not use any sheets or blankets that are too big for your bed. They should not hang down onto the floor.  Have a firm chair that has side arms. You can use this for support while you get dressed.  Do not have throw rugs and other things on the floor that can make you trip. What can I do in the kitchen?  Clean up any spills right away.  Avoid walking on wet floors.  Keep items that you use a lot in easy-to-reach places.  If you need to reach something above you, use a strong step stool that has a grab bar.  Keep electrical cords out of the way.  Do not use floor polish or wax that makes floors slippery. If you must use wax, use non-skid  floor wax.  Do not have throw rugs and other things on the floor that can make you trip. What can I do with my stairs?  Do not leave any items on the stairs.  Make sure that there are handrails on both sides of the stairs and use them. Fix handrails that are broken or loose. Make sure that handrails are as long as the stairways.  Check any carpeting to make sure that it is firmly attached to the stairs. Fix any carpet that is loose or worn.  Avoid having throw rugs at the top or bottom of the stairs. If you do have throw rugs, attach them to the floor with carpet tape.  Make sure that you have a light switch at the top of the stairs and the bottom of the stairs. If you do not have them, ask someone to add them for you. What else can I do to help prevent falls?  Wear shoes that:  Do not have high heels.  Have rubber bottoms.  Are comfortable and fit you  well.  Are closed at the toe. Do not wear sandals.  If you use a stepladder:  Make sure that it is fully opened. Do not climb a closed stepladder.  Make sure that both sides of the stepladder are locked into place.  Ask someone to hold it for you, if possible.  Clearly mark and make sure that you can see:  Any grab bars or handrails.  First and last steps.  Where the edge of each step is.  Use tools that help you move around (mobility aids) if they are needed. These include:  Canes.  Walkers.  Scooters.  Crutches.  Turn on the lights when you go into a dark area. Replace any light bulbs as soon as they burn out.  Set up your furniture so you have a clear path. Avoid moving your furniture around.  If any of your floors are uneven, fix them.  If there are any pets around you, be aware of where they are.  Review your medicines with your doctor. Some medicines can make you feel dizzy. This can increase your chance of falling. Ask your doctor what other things that you can do to help prevent falls. This information is not intended to replace advice given to you by your health care provider. Make sure you discuss any questions you have with your health care provider. Document Released: 03/22/2009 Document Revised: 11/01/2015 Document Reviewed: 06/30/2014 Elsevier Interactive Patient Education  2017 [employer].

## 2019-04-06 ENCOUNTER — Encounter: Payer: Self-pay | Admitting: Family Medicine

## 2019-04-06 ENCOUNTER — Other Ambulatory Visit: Payer: Self-pay

## 2019-04-06 ENCOUNTER — Ambulatory Visit (INDEPENDENT_AMBULATORY_CARE_PROVIDER_SITE_OTHER): Payer: Medicare Other | Admitting: Family Medicine

## 2019-04-06 VITALS — BP 135/80 | HR 96 | Temp 97.7°F | Ht 61.25 in | Wt 160.0 lb

## 2019-04-06 DIAGNOSIS — E1169 Type 2 diabetes mellitus with other specified complication: Secondary | ICD-10-CM

## 2019-04-06 DIAGNOSIS — I10 Essential (primary) hypertension: Secondary | ICD-10-CM

## 2019-04-06 DIAGNOSIS — E663 Overweight: Secondary | ICD-10-CM

## 2019-04-06 DIAGNOSIS — E1165 Type 2 diabetes mellitus with hyperglycemia: Secondary | ICD-10-CM | POA: Diagnosis not present

## 2019-04-06 DIAGNOSIS — Z0000 Encounter for general adult medical examination without abnormal findings: Secondary | ICD-10-CM

## 2019-04-06 DIAGNOSIS — E785 Hyperlipidemia, unspecified: Secondary | ICD-10-CM

## 2019-04-06 DIAGNOSIS — E039 Hypothyroidism, unspecified: Secondary | ICD-10-CM

## 2019-04-06 DIAGNOSIS — Z1211 Encounter for screening for malignant neoplasm of colon: Secondary | ICD-10-CM

## 2019-04-06 MED ORDER — SIMVASTATIN 80 MG PO TABS: 80.0000 mg | ORAL_TABLET | Freq: Every day | ORAL | 3 refills | Status: DC

## 2019-04-06 MED ORDER — HYDROCHLOROTHIAZIDE 25 MG PO TABS: 12.5000 mg | ORAL_TABLET | Freq: Every day | ORAL | 3 refills | Status: DC

## 2019-04-06 MED ORDER — LISINOPRIL 20 MG PO TABS: 20.0000 mg | ORAL_TABLET | Freq: Every day | ORAL | 3 refills | Status: DC

## 2019-04-06 NOTE — Assessment & Plan Note (Signed)
Disc goals for lipids and reasons to control them Rev last labs with pt Rev low sat fat diet in detail  LDL 94 On max dose of simvastatin  Enc to watch diet

## 2019-04-06 NOTE — Assessment & Plan Note (Signed)
Discussed how this problem influences overall health and the risks it imposes  Reviewed plan for weight loss with lower calorie diet (via better food choices and also portion control or program like weight watchers) and exercise building up to or more than 30 minutes 5 days per week including some aerobic activity    

## 2019-04-06 NOTE — Assessment & Plan Note (Signed)
Hypothyroidism  Pt has no clinical changes No change in energy level/ hair or skin/ edema and no tremor Lab Results  Component Value Date   TSH 0.94 03/30/2019    Also seeing endocrinology

## 2019-04-06 NOTE — Assessment & Plan Note (Signed)
Pt sees endocrinologist for care  Lab Results  Component Value Date   HGBA1C 7.9 (A) 02/22/2019   Eye exam 7/20  Nl foot exam  On statin

## 2019-04-06 NOTE — Progress Notes (Signed)
Subjective:    Patient ID: Amanda King, female    DOB: [birth year], 78 y.o.   MRN: [id number]  HPI Here for health maintenance exam and to review chronic medical problems   Has been feeling ok in general   Hearing aide fell out -cannot hear today  Had amw on 10/22 Declined shingrix vaccine  Declined mammogram   dexa declines Colon cancer screening-declines   Mammogram 5/15 -normal Self breast exam --no breast lumps  Weight  Wt Readings from Last 3 Encounters:  04/06/19 160 lb (72.6 kg)  03/31/19 158 lb (71.7 kg)  03/22/19 160 lb (72.6 kg)  thinks she needs to drink more water  Can get out and walk -30 minutes per day  Diet is much better  29.99 kg/m   Hypertension  bp is stable today  No cp or palpitations or headaches or edema  No side effects to medicines  BP Readings from Last 3 Encounters:  04/06/19 (!) 142/76  03/31/19 140/80  03/22/19 140/80    Dr Lucianne Muss cut her hctz to 1/2 Re check 135/80   DM2 Lab Results  Component Value Date   HGBA1C 7.9 (A) 02/22/2019  this is down from 8.9 in December  Was ref to endocrinology  Doing better per pt  Sent for her eye exam report on 7/20 -no problems    Hyperlipidemia Lab Results  Component Value Date   CHOL 186 03/30/2019   CHOL 181 06/24/2018   CHOL 188 03/09/2018   Lab Results  Component Value Date   HDL 44.30 03/30/2019   HDL 38.20 (L) 06/24/2018   HDL 39.40 03/09/2018   Lab Results  Component Value Date   LDLCALC 73 06/21/2014   LDLCALC 49 09/14/2013   LDLCALC 51 12/31/2011   Lab Results  Component Value Date   TRIG 229.0 (H) 03/30/2019   TRIG 370.0 (H) 06/24/2018   TRIG 357.0 (H) 03/09/2018   Lab Results  Component Value Date   CHOLHDL 4 03/30/2019   CHOLHDL 5 06/24/2018   CHOLHDL 5 03/09/2018   Lab Results  Component Value Date   LDLDIRECT 94.0 03/30/2019   LDLDIRECT 86.0 06/24/2018   LDLDIRECT 94.0 03/09/2018  simvastatin and diet  She is eating fat  No fried food    Hypothyroidism  Pt has no clinical changes No change in energy level/ hair or skin/ edema and no tremor Lab Results  Component Value Date   TSH 0.94 03/30/2019     Lab Results  Component Value Date   CREATININE 0.92 03/30/2019   BUN 21 03/30/2019   NA 139 03/30/2019   K 4.3 03/30/2019   CL 101 03/30/2019   CO2 30 03/30/2019   Lab Results  Component Value Date   ALT 27 03/30/2019   AST 20 03/30/2019   ALKPHOS 83 03/30/2019   BILITOT 0.4 03/30/2019    Lab Results  Component Value Date   WBC 8.7 03/30/2019   HGB 12.8 03/30/2019   HCT 38.4 03/30/2019   MCV 90.2 03/30/2019   PLT 262.0 03/30/2019     Patient Active Problem List   Diagnosis Date Noted  . Ischemic colitis (HCC)   . Constipation   . Screening mammogram, encounter for 03/30/2018  . Routine general medical examination at a health care facility 01/31/2016  . Narcolepsy with cataplexy 10/31/2014  . Stress reaction 03/28/2014  . Encounter for Medicare annual wellness exam 09/22/2013  . Hypersomnia, persistent 07/15/2013  . Colon cancer screening 12/31/2011  . Overweight (BMI  25.0-29.9) 07/01/2011  . ANXIETY 05/06/2010  . DERMATOPHYTOSIS OF NAIL 12/21/2008  . Hypothyroidism 09/02/2006  . Diabetes type 2, uncontrolled (HCC) 09/02/2006  . Hyperlipidemia associated with type 2 diabetes mellitus (HCC) 09/02/2006  . NARCOLEPSY W/CATAPLEXY 09/02/2006  . Essential hypertension 09/02/2006  . PNEUMONIA, HX OF 09/02/2006   Past Medical History:  Diagnosis Date  . Diabetes mellitus    Type II  . Hyperlipidemia   . Hypertension   . Hypothyroidism   . Migraine   . Narcolepsy with cataplexy   . Pneumonia 08/2001   Past Surgical History:  Procedure Laterality Date  . APPENDECTOMY  age 29  . BIOPSY  05/24/2018   Procedure: BIOPSY;  Surgeon: Meridee Score Netty Starring., MD;  Location: [facility] ENDOSCOPY;  Service: Gastroenterology;;  . COLONOSCOPY N/A 05/24/2018   Procedure: COLONOSCOPY;  Surgeon: Lemar Lofty.,  MD;  Location: [facility] ENDOSCOPY;  Service: Gastroenterology;  Laterality: N/A;  . hemilaminectomy     C5-6; C6-7  . PARTIAL HYSTERECTOMY  age 54  . POSTERIOR CERVICAL FUSION/FORAMINOTOMY  02/05/1975   C5-C6-C7   Social History   Tobacco Use  . Smoking status: Former Smoker    Quit date: 06/09/1980    Years since quitting: 38.8  . Smokeless tobacco: Never Used  Substance Use Topics  . Alcohol use: No    Alcohol/week: 0.0 standard drinks  . Drug use: No   Family History  Problem Relation Age of Onset  . Depression Brother        suicide  . Diabetes Brother   . Heart disease Mother        MI  . Peripheral vascular disease Mother   . Hyperlipidemia Mother   . Diabetes Brother   . Sleep apnea Son   . Diabetes Son   . Sleep apnea Brother    Allergies  Allergen Reactions  . Penicillins Other (See Comments)    Has patient had a PCN reaction causing immediate rash, facial/tongue/throat swelling, SOB or lightheadedness with hypotension: Unknown Has patient had a PCN reaction causing severe rash involving mucus membranes or skin necrosis: Unknown Has patient had a PCN reaction that required hospitalization: Unknown Has patient had a PCN reaction occurring within the last 10 years: No If all of the above answers are "NO", then may proceed with Cephalosporin use.    Current Outpatient Medications on File Prior to Visit  Medication Sig Dispense Refill  . cetirizine (ZYRTEC) 10 MG tablet Take 10 mg by mouth daily as needed for allergies or rhinitis.     Marland Kitchen glipiZIDE (GLUCOTROL XL) 10 MG 24 hr tablet Take 1 tablet (10 mg total) by mouth daily with breakfast. 90 tablet 3  . glucose blood test strip Use onetouch verio test strip as instructed to check blood sugar once daily. 100 each 1  . imipramine (TOFRANIL) 50 MG tablet TAKE 1 TABLET BY MOUTH AT BEDTIME 30 tablet 5  . Insulin Glargine (LANTUS) 100 UNIT/ML Solostar Pen Start with 8 units once a day and increase as directed (Patient taking  differently: Inject 10 Units into the skin at bedtime. Inject 10 units under the skin once daily.) 15 mL 11  . insulin lispro (HUMALOG KWIKPEN) 100 UNIT/ML KwikPen 4 units twice daily before meals 15 mL 1  . Insulin Pen Needle (B-D UF III MINI PEN NEEDLES) 31G X 5 MM MISC Use to inject insulin twice daily. 100 each 2  . levothyroxine (SYNTHROID) 75 MCG tablet Take 1 tablet (75 mcg total) by mouth daily before  breakfast. Take 1 tablet by mouth once daily. 30 tablet 2  . metFORMIN (GLUCOPHAGE-XR) 750 MG 24 hr tablet Take 1 tablet (750 mg total) by mouth 2 (two) times daily. 180 tablet 3  . methylphenidate (RITALIN) 20 MG tablet Take 0.5 tablets (10 mg total) by mouth 3 (three) times daily with meals. 45 tablet 0  . OneTouch Delica Lancets 30G MISC 1 each by Does not apply route See admin instructions. Use onetouch delica lancet to check blood sugar once daily. 50 each 1  . polyethylene glycol (MIRALAX / GLYCOLAX) packet Take 17 g by mouth daily as needed. 14 each 0  . Probiotic Product (ALIGN) 4 MG CAPS Take by mouth daily.     No current facility-administered medications on file prior to visit.     Review of Systems  Constitutional: Negative for activity change, appetite change, fatigue, fever and unexpected weight change.  HENT: Positive for hearing loss. Negative for congestion, ear pain, rhinorrhea, sinus pressure and sore throat.   Eyes: Negative for pain, redness and visual disturbance.  Respiratory: Negative for cough, shortness of breath and wheezing.   Cardiovascular: Negative for chest pain and palpitations.  Gastrointestinal: Negative for abdominal pain, blood in stool, constipation and diarrhea.  Endocrine: Negative for polydipsia and polyuria.  Genitourinary: Negative for dysuria, frequency and urgency.  Musculoskeletal: Negative for arthralgias, back pain and myalgias.  Skin: Negative for pallor and rash.  Allergic/Immunologic: Negative for environmental allergies.  Neurological:  Negative for dizziness, syncope and headaches.  Hematological: Negative for adenopathy. Does not bruise/bleed easily.  Psychiatric/Behavioral: Negative for decreased concentration and dysphoric mood. The patient is not nervous/anxious.        Objective:   Physical Exam Constitutional:      General: She is not in acute distress.    Appearance: Normal appearance. She is well-developed. She is obese. She is not ill-appearing or diaphoretic.  HENT:     Head: Normocephalic and atraumatic.     Right Ear: Tympanic membrane, ear canal and external ear normal.     Left Ear: Tympanic membrane, ear canal and external ear normal.     Nose: Nose normal. No congestion.     Mouth/Throat:     Mouth: Mucous membranes are moist.     Pharynx: Oropharynx is clear. No posterior oropharyngeal erythema.  Eyes:     General: No scleral icterus.    Extraocular Movements: Extraocular movements intact.     Conjunctiva/sclera: Conjunctivae normal.     Pupils: Pupils are equal, round, and reactive to light.  Neck:     Musculoskeletal: Normal range of motion and neck supple. No neck rigidity or muscular tenderness.     Thyroid: No thyromegaly.     Vascular: No carotid bruit or JVD.  Cardiovascular:     Rate and Rhythm: Normal rate and regular rhythm.     Pulses: Normal pulses.     Heart sounds: Normal heart sounds. No gallop.   Pulmonary:     Effort: Pulmonary effort is normal. No respiratory distress.     Breath sounds: Normal breath sounds. No wheezing.     Comments: Good air exch Chest:     Chest wall: No tenderness.  Abdominal:     General: Bowel sounds are normal. There is no distension or abdominal bruit.     Palpations: Abdomen is soft. There is no mass.     Tenderness: There is no abdominal tenderness.     Hernia: No hernia is present.  Genitourinary:  Comments: Breast exam: No mass, nodules, thickening, tenderness, bulging, retraction, inflamation, nipple discharge or skin changes noted.  No  axillary or clavicular LA.     Musculoskeletal: Normal range of motion.        General: No tenderness.     Right lower leg: No edema.     Left lower leg: No edema.  Lymphadenopathy:     Cervical: No cervical adenopathy.  Skin:    General: Skin is warm and dry.     Coloration: Skin is not pale.     Findings: No erythema or rash.     Comments: Lentigines Some sks   Neurological:     Mental Status: She is alert. Mental status is at baseline.     Cranial Nerves: No cranial nerve deficit.     Motor: No abnormal muscle tone.     Coordination: Coordination normal.     Gait: Gait normal.     Deep Tendon Reflexes: Reflexes are normal and symmetric. Reflexes normal.  Psychiatric:        Mood and Affect: Mood normal.        Cognition and Memory: Cognition and memory normal.           Assessment & Plan:   Problem List Items Addressed This Visit      Cardiovascular and Mediastinum   Essential hypertension    bp in fair control at this time  BP Readings from Last 1 Encounters:  04/06/19 135/80   No changes needed Most recent labs reviewed  Disc lifstyle change with low sodium diet and exercise        Relevant Medications   simvastatin (ZOCOR) 80 MG tablet   lisinopril (ZESTRIL) 20 MG tablet   hydrochlorothiazide (HYDRODIURIL) 25 MG tablet     Endocrine   Hypothyroidism    Hypothyroidism  Pt has no clinical changes No change in energy level/ hair or skin/ edema and no tremor Lab Results  Component Value Date   TSH 0.94 03/30/2019    Also seeing endocrinology      Diabetes type 2, uncontrolled (HCC)    Pt sees endocrinologist for care  Lab Results  Component Value Date   HGBA1C 7.9 (A) 02/22/2019   Eye exam 7/20  Nl foot exam  On statin      Relevant Medications   simvastatin (ZOCOR) 80 MG tablet   lisinopril (ZESTRIL) 20 MG tablet   Hyperlipidemia associated with type 2 diabetes mellitus (HCC)    Disc goals for lipids and reasons to control them Rev  last labs with pt Rev low sat fat diet in detail  LDL 94 On max dose of simvastatin  Enc to watch diet        Relevant Medications   simvastatin (ZOCOR) 80 MG tablet   lisinopril (ZESTRIL) 20 MG tablet     Other   Overweight (BMI 25.0-29.9)    Discussed how this problem influences overall health and the risks it imposes  Reviewed plan for weight loss with lower calorie diet (via better food choices and also portion control or program like weight watchers) and exercise building up to or more than 30 minutes 5 days per week including some aerobic activity         Colon cancer screening    declines      Routine general medical examination at a health care facility - Primary    Reviewed health habits including diet and exercise and skin cancer prevention Reviewed appropriate screening tests for  age  Also reviewed health mt list, fam hx and immunization status , as well as social and family history   See HPI Labs reviewed  Pt declines breast or colon cancer screening Declines dexa Declines shingrix vaccine

## 2019-04-06 NOTE — Assessment & Plan Note (Signed)
bp in fair control at this time  BP Readings from Last 1 Encounters:  04/06/19 135/80   No changes needed Most recent labs reviewed  Disc lifstyle change with low sodium diet and exercise

## 2019-04-06 NOTE — Assessment & Plan Note (Signed)
Reviewed health habits including diet and exercise and skin cancer prevention Reviewed appropriate screening tests for age  Also reviewed health mt list, fam hx and immunization status , as well as social and family history   See HPI Labs reviewed  Pt declines breast or colon cancer screening Declines dexa Declines shingrix vaccine

## 2019-04-06 NOTE — Assessment & Plan Note (Signed)
declines

## 2019-04-06 NOTE — Patient Instructions (Addendum)
For cholesterol Avoid red meat/ fried foods/ egg yolks/ fatty breakfast meats/ butter, cheese and high fat dairy/ and shellfish    If you change your mind about a mammogram please let us know  Keep walking  Take care of yourself

## 2019-04-07 ENCOUNTER — Other Ambulatory Visit: Payer: Self-pay | Admitting: Adult Health

## 2019-04-07 DIAGNOSIS — G47411 Narcolepsy with cataplexy: Secondary | ICD-10-CM

## 2019-04-07 MED ORDER — METHYLPHENIDATE HCL 20 MG PO TABS: 10.0000 mg | ORAL_TABLET | Freq: Three times a day (TID) | ORAL | 0 refills | Status: DC

## 2019-04-07 NOTE — Addendum Note (Signed)
Addended by: Brandon Melnick on: 04/07/2019 02:14 PM   Modules accepted: Orders

## 2019-04-07 NOTE — Telephone Encounter (Signed)
Drug registry checked ritalin 20mg  po tid.  Last fill 12-22-18 #45.  Has been given #180 last 2 fills prior by Dr Brett Fairy.

## 2019-04-07 NOTE — Telephone Encounter (Signed)
Pt has called for her refill on her methylphenidate (RITALIN) 20 MG tablet [facility]  Pt states last time she was only given 45 tablets.  Pt states her insurance allows 180 tablets.  Please fill for 180

## 2019-04-18 ENCOUNTER — Other Ambulatory Visit: Payer: Self-pay | Admitting: Neurology

## 2019-04-18 DIAGNOSIS — G47411 Narcolepsy with cataplexy: Secondary | ICD-10-CM

## 2019-04-18 MED ORDER — METHYLPHENIDATE HCL 20 MG PO TABS: 10.0000 mg | ORAL_TABLET | Freq: Three times a day (TID) | ORAL | 0 refills | Status: DC

## 2019-04-29 ENCOUNTER — Other Ambulatory Visit: Payer: Self-pay | Admitting: Neurology

## 2019-04-29 DIAGNOSIS — G47411 Narcolepsy with cataplexy: Secondary | ICD-10-CM

## 2019-05-24 ENCOUNTER — Other Ambulatory Visit: Payer: Self-pay

## 2019-05-24 ENCOUNTER — Other Ambulatory Visit: Payer: Self-pay | Admitting: Family Medicine

## 2019-05-24 ENCOUNTER — Encounter: Payer: Self-pay | Admitting: Endocrinology

## 2019-05-24 ENCOUNTER — Ambulatory Visit (INDEPENDENT_AMBULATORY_CARE_PROVIDER_SITE_OTHER): Payer: Medicare Other | Admitting: Endocrinology

## 2019-05-24 VITALS — BP 140/80 | HR 101 | Ht 61.25 in | Wt 163.0 lb

## 2019-05-24 DIAGNOSIS — E1165 Type 2 diabetes mellitus with hyperglycemia: Secondary | ICD-10-CM

## 2019-05-24 LAB — GLUCOSE, POCT (MANUAL RESULT ENTRY): POC Glucose: 92 mg/dl (ref 70–99)

## 2019-05-24 LAB — POCT GLYCOSYLATED HEMOGLOBIN (HGB A1C): Hemoglobin A1C: 6.4 % — AB (ref 4.0–5.6)

## 2019-05-24 NOTE — Patient Instructions (Addendum)
Check blood sugars on waking up 3-4  days a week  Also check blood sugars about 2 hours after meals and do this after different meals by rotation  Recommended blood sugar levels on waking up are 80-130 and about 2 hours after meal is 130-170  Please bring your blood sugar monitor to each visit, thank you  INDOOR EXERCISE IDEAS   Use the following examples for a creative indoor workout (perform each move for 2-3 minutes):   Warm up. Put on some music that makes you feel like moving, and dance around the living room.  Watch exercise shows on TV and move along with them. There are tons of free cable channels that have daily exercise shows on them for all levels - beginner through advanced.   You can easily find a number of exercise videos but use one that will suit your liking and exercise level; you can do these on your own schedule.  Walk up and down the steps.  Do dumbbell curls and presses (if you don't have weights, use full water bottles).  Do assisted squats, keeping your back on a fitness ball against the wall or using the back of the couch for support.  Shadow box: Lift and lower the left leg; jab with the right arm, then the left; then lift and lower the right leg.  Fence (you don't even need swords). Pretend you're holding a sword in each hand. Create an X pattern standing still, then moving forward and back.  Hop on your exercise bike or treadmill -- or, for something different, use a weighted hula hoop. If you don't have any of those, just go back to dancing.  Do abdominal crunches (hold a weighted ball for added resistance).  Cool down with [employer] "I Feel Good" -- or whatever tune makes you feel good

## 2019-05-24 NOTE — Telephone Encounter (Signed)
Routing to Endo doc since he has taken over DM care

## 2019-05-24 NOTE — Progress Notes (Signed)
Patient ID: Amanda King, female   DOB: Aug 30, 1940, 78 y.o.   MRN: [id number]           Reason for Appointment: Follow-up for Type 2 Diabetes  Referring PCP: Dr. Milinda Antis   History of Present Illness:          Date of diagnosis of type 2 diabetes mellitus:  2008      Background history:  She was initially treated with metformin and subsequently glipizide added Januvia was added in 12/2016 She has had variable control with A1c as high as 10.3 done in 2015 and as low as 7.3 done in April 2016 Has generally been followed by her PCP about twice a year  Recent history:     Non-insulin hypoglycemic drugs:  glipizide ER 10 mg daily, metformin ER 750 mg twice daily  INSULIN doses: Lantus 10 units daily acs, Humalog 3 units at supper  Her A1c is markedly improved at 6.4 compared to 7.9  Current management, blood sugar patterns and problems identified:  She did not bring her monitor for download today  Has been consistent with taking her insulin doses as directed even though she is only taking 3 units of Humalog now at suppertime  She will take her Lantus at the same time also  She is tolerating Metformin twice a day even though previously had reported diarrhea from larger doses  Recently she is trying to do a little better with her diet but appears to be gradually gaining weight  Is not doing any walking because of cold weather  She thinks her fasting readings are fairly good and consistent by recall  Random lab glucose today was 92  She has only had one high reading of about 180-190 after eating dessert        Side effects from medications have been: Diarrhea from high dose metformin            Exercise:  walking about 20 minutes only with warm weather  Glucose monitoring:  done less than 1 times a day         Glucometer:  One Touch       Blood Glucose readings by time of day from recall  :   PRE-MEAL Fasting Lunch Dinner Bedtime Overall  Glucose range: 101-106  74  130-146   Mean/median:        Previous readings:  PRE-MEAL Fasting Lunch Dinner Bedtime Overall  Glucose range:  84-166    132, 99   Mean/median: 138     142   POST-MEAL PC Breakfast PC Lunch PC Dinner  Glucose range:  160-200    Mean/median:        Dietician visit, most recent: 08/12/2018  Weight history:  Wt Readings from Last 3 Encounters:  05/24/19 163 lb (73.9 kg)  04/06/19 160 lb (72.6 kg)  03/31/19 158 lb (71.7 kg)    Glycemic control:   Lab Results  Component Value Date   HGBA1C 7.9 (A) 02/22/2019   HGBA1C 7.9 (A) 09/30/2018   HGBA1C 8.9 (H) 06/24/2018   Lab Results  Component Value Date   MICROALBUR <0.7 02/22/2019   LDLCALC 73 06/21/2014   CREATININE 0.92 03/30/2019   Lab Results  Component Value Date   MICRALBCREAT 1.6 02/22/2019    Lab Results  Component Value Date   FRUCTOSAMINE 267 11/22/2018   FRUCTOSAMINE 349 (H) 08/18/2018    No visits with results within 1 Week(s) from this visit.  Latest known visit with results is:  Lab on 03/30/2019  Component Date Value Ref Range Status  . TSH 03/30/2019 0.94  0.35 - 4.50 uIU/mL Final  . Cholesterol 03/30/2019 186  0 - 200 mg/dL Final   ATP III Classification       Desirable:  < 200 mg/dL               Borderline High:  200 - 239 mg/dL          High:  > = 161 mg/dL  . Triglycerides 03/30/2019 229.0* 0.0 - 149.0 mg/dL Final   Normal:  <096 mg/dLBorderline High:  150 - 199 mg/dL  . HDL 03/30/2019 44.30  >39.00 mg/dL Final  . VLDL 04/54/0981 45.8* 0.0 - 40.0 mg/dL Final  . Total CHOL/HDL Ratio 03/30/2019 4   Final                  Men          Women1/2 Average Risk     3.4          3.3Average Risk          5.0          4.42X Average Risk          9.6          7.13X Average Risk          15.0          11.0                      . NonHDL 03/30/2019 141.85   Final   NOTE:  Non-HDL goal should be 30 mg/dL higher than patient's LDL goal (i.e. LDL goal of < 70 mg/dL, would have non-HDL goal of < 100 mg/dL)  .  Sodium 03/30/2019 139  135 - 145 mEq/L Final  . Potassium 03/30/2019 4.3  3.5 - 5.1 mEq/L Final  . Chloride 03/30/2019 101  96 - 112 mEq/L Final  . CO2 03/30/2019 30  19 - 32 mEq/L Final  . Glucose, Bld 03/30/2019 116* 70 - 99 mg/dL Final  . BUN 19/14/7829 21  6 - 23 mg/dL Final  . Creatinine, Ser 03/30/2019 0.92  0.40 - 1.20 mg/dL Final  . Total Bilirubin 03/30/2019 0.4  0.2 - 1.2 mg/dL Final  . Alkaline Phosphatase 03/30/2019 83  39 - 117 U/L Final  . AST 03/30/2019 20  0 - 37 U/L Final  . ALT 03/30/2019 27  0 - 35 U/L Final  . Total Protein 03/30/2019 6.9  6.0 - 8.3 g/dL Final  . Albumin 56/21/3086 4.5  3.5 - 5.2 g/dL Final  . Calcium 57/84/6962 9.0  8.4 - 10.5 mg/dL Final  . GFR 95/28/4132 58.82* >60.00 mL/min Final  . WBC 03/30/2019 8.7  4.0 - 10.5 K/uL Final  . RBC 03/30/2019 4.26  3.87 - 5.11 Mil/uL Final  . Hemoglobin 03/30/2019 12.8  12.0 - 15.0 g/dL Final  . HCT 44/06/270 38.4  36.0 - 46.0 % Final  . MCV 03/30/2019 90.2  78.0 - 100.0 fl Final  . MCHC 03/30/2019 33.3  30.0 - 36.0 g/dL Final  . RDW 53/66/4403 13.8  11.5 - 15.5 % Final  . Platelets 03/30/2019 262.0  150.0 - 400.0 K/uL Final  . Neutrophils Relative % 03/30/2019 65.7  43.0 - 77.0 % Final  . Lymphocytes Relative 03/30/2019 27.1  12.0 - 46.0 % Final  . Monocytes Relative 03/30/2019 5.5  3.0 - 12.0 % Final  . Eosinophils Relative 03/30/2019  1.3  0.0 - 5.0 % Final  . Basophils Relative 03/30/2019 0.4  0.0 - 3.0 % Final  . Neutro Abs 03/30/2019 5.8  1.4 - 7.7 K/uL Final  . Lymphs Abs 03/30/2019 2.4  0.7 - 4.0 K/uL Final  . Monocytes Absolute 03/30/2019 0.5  0.1 - 1.0 K/uL Final  . Eosinophils Absolute 03/30/2019 0.1  0.0 - 0.7 K/uL Final  . Basophils Absolute 03/30/2019 0.0  0.0 - 0.1 K/uL Final  . Direct LDL 03/30/2019 94.0  mg/dL Final   Optimal:  <161 mg/dLNear or Above Optimal:  100-129 mg/dLBorderline High:  130-159 mg/dLHigh:  160-189 mg/dLVery High:  >190 mg/dL    Allergies as of 09/60/4540       Reactions   Penicillins Other (See Comments)   Has patient had a PCN reaction causing immediate rash, facial/tongue/throat swelling, SOB or lightheadedness with hypotension: Unknown Has patient had a PCN reaction causing severe rash involving mucus membranes or skin necrosis: Unknown Has patient had a PCN reaction that required hospitalization: Unknown Has patient had a PCN reaction occurring within the last 10 years: No If all of the above answers are "NO", then may proceed with Cephalosporin use.      Medication List       Accurate as of May 24, 2019  1:29 PM. If you have any questions, ask your nurse or doctor.        Align 4 MG Caps Take by mouth daily.   B-D UF III MINI PEN NEEDLES 31G X 5 MM Misc Generic drug: Insulin Pen Needle Use to inject insulin twice daily.   cetirizine 10 MG tablet Commonly known as: ZYRTEC Take 10 mg by mouth daily as needed for allergies or rhinitis.   glipiZIDE 10 MG 24 hr tablet Commonly known as: GLUCOTROL XL Take 1 tablet (10 mg total) by mouth daily with breakfast.   glucose blood test strip Use onetouch verio test strip as instructed to check blood sugar once daily.   hydrochlorothiazide 25 MG tablet Commonly known as: HYDRODIURIL Take 0.5 tablets (12.5 mg total) by mouth daily.   imipramine 50 MG tablet Commonly known as: TOFRANIL TAKE 1 TABLET BY MOUTH AT BEDTIME   Insulin Glargine 100 UNIT/ML Solostar Pen Commonly known as: LANTUS Start with 8 units once a day and increase as directed What changed:   how much to take  how to take this  when to take this  additional instructions   insulin lispro 100 UNIT/ML KwikPen Commonly known as: HumaLOG KwikPen 4 units twice daily before meals What changed:   how much to take  how to take this  when to take this  additional instructions   levothyroxine 75 MCG tablet Commonly known as: SYNTHROID Take 1 tablet (75 mcg total) by mouth daily before breakfast. Take 1  tablet by mouth once daily.   lisinopril 20 MG tablet Commonly known as: ZESTRIL Take 1 tablet (20 mg total) by mouth daily.   metFORMIN 750 MG 24 hr tablet Commonly known as: GLUCOPHAGE-XR Take 1 tablet (750 mg total) by mouth 2 (two) times daily.   methylphenidate 20 MG tablet Commonly known as: RITALIN Take 0.5 tablets (10 mg total) by mouth 3 (three) times daily with meals.   OneTouch Delica Lancets 30G Misc 1 each by Does not apply route See admin instructions. Use onetouch delica lancet to check blood sugar once daily.   polyethylene glycol 17 g packet Commonly known as: MIRALAX / GLYCOLAX Take 17 g by mouth daily  as needed.   simvastatin 80 MG tablet Commonly known as: ZOCOR Take 1 tablet (80 mg total) by mouth at bedtime.       Allergies:  Allergies  Allergen Reactions  . Penicillins Other (See Comments)    Has patient had a PCN reaction causing immediate rash, facial/tongue/throat swelling, SOB or lightheadedness with hypotension: Unknown Has patient had a PCN reaction causing severe rash involving mucus membranes or skin necrosis: Unknown Has patient had a PCN reaction that required hospitalization: Unknown Has patient had a PCN reaction occurring within the last 10 years: No If all of the above answers are "NO", then may proceed with Cephalosporin use.     Past Medical History:  Diagnosis Date  . Diabetes mellitus    Type II  . Hyperlipidemia   . Hypertension   . Hypothyroidism   . Migraine   . Narcolepsy with cataplexy   . Pneumonia 08/2001    Past Surgical History:  Procedure Laterality Date  . APPENDECTOMY  age 68  . BIOPSY  05/24/2018   Procedure: BIOPSY;  Surgeon: Meridee Score Netty Starring., MD;  Location: [facility] ENDOSCOPY;  Service: Gastroenterology;;  . COLONOSCOPY N/A 05/24/2018   Procedure: COLONOSCOPY;  Surgeon: Lemar Lofty., MD;  Location: [facility] ENDOSCOPY;  Service: Gastroenterology;  Laterality: N/A;  . hemilaminectomy     C5-6; C6-7    . PARTIAL HYSTERECTOMY  age 50  . POSTERIOR CERVICAL FUSION/FORAMINOTOMY  02/05/1975   C5-C6-C7    Family History  Problem Relation Age of Onset  . Depression Brother        suicide  . Diabetes Brother   . Heart disease Mother        MI  . Peripheral vascular disease Mother   . Hyperlipidemia Mother   . Diabetes Brother   . Sleep apnea Son   . Diabetes Son   . Sleep apnea Brother     Social History:  reports that she quit smoking about 38 years ago. She has never used smokeless tobacco. She reports that she does not drink alcohol or use drugs.   Review of Systems   Lipid history: Has been treated by her PCP with simvastatin 80 mg Has high triglycerides   Lab Results  Component Value Date   CHOL 186 03/30/2019   HDL 44.30 03/30/2019   LDLCALC 73 06/21/2014   LDLDIRECT 94.0 03/30/2019   TRIG 229.0 (H) 03/30/2019   CHOLHDL 4 03/30/2019           Hypertension: Has been treated with HCTZ, half tablet and lisinopril by her PCP HCTZ was reduced with starting Jardiance  She does not monitor at home  BP Readings from Last 3 Encounters:  05/24/19 140/80  04/06/19 135/80  03/31/19 140/80    Most recent eye exam was in 8/20  Most recent foot exam:2/20   Hypothyroidism: Taking 75 mcg of levothyroxine now and last TSH was normal  Lab Results  Component Value Date   TSH 0.94 03/30/2019   TSH 0.21 (L) 02/22/2019   TSH 1.34 03/09/2018   FREET4 1.00 02/22/2019      LABS:  No visits with results within 1 Week(s) from this visit.  Latest known visit with results is:  Lab on 03/30/2019  Component Date Value Ref Range Status  . TSH 03/30/2019 0.94  0.35 - 4.50 uIU/mL Final  . Cholesterol 03/30/2019 186  0 - 200 mg/dL Final   ATP III Classification       Desirable:  < 200 mg/dL  Borderline High:  200 - 239 mg/dL          High:  > = 240 mg/dL  . Triglycerides 03/30/2019 229.0* 0.0 - 149.0 mg/dL Final   Normal:  <150 mg/dLBorderline High:  150 - 199  mg/dL  . HDL 03/30/2019 44.30  >39.00 mg/dL Final  . VLDL 03/30/2019 45.8* 0.0 - 40.0 mg/dL Final  . Total CHOL/HDL Ratio 03/30/2019 4   Final                  Men          Women1/2 Average Risk     3.4          3.3Average Risk          5.0          4.42X Average Risk          9.6          7.13X Average Risk          15.0          11.0                      . NonHDL 03/30/2019 141.85   Final   NOTE:  Non-HDL goal should be 30 mg/dL higher than patient's LDL goal (i.e. LDL goal of < 70 mg/dL, would have non-HDL goal of < 100 mg/dL)  . Sodium 03/30/2019 139  135 - 145 mEq/L Final  . Potassium 03/30/2019 4.3  3.5 - 5.1 mEq/L Final  . Chloride 03/30/2019 101  96 - 112 mEq/L Final  . CO2 03/30/2019 30  19 - 32 mEq/L Final  . Glucose, Bld 03/30/2019 116* 70 - 99 mg/dL Final  . BUN 03/30/2019 21  6 - 23 mg/dL Final  . Creatinine, Ser 03/30/2019 0.92  0.40 - 1.20 mg/dL Final  . Total Bilirubin 03/30/2019 0.4  0.2 - 1.2 mg/dL Final  . Alkaline Phosphatase 03/30/2019 83  39 - 117 U/L Final  . AST 03/30/2019 20  0 - 37 U/L Final  . ALT 03/30/2019 27  0 - 35 U/L Final  . Total Protein 03/30/2019 6.9  6.0 - 8.3 g/dL Final  . Albumin 03/30/2019 4.5  3.5 - 5.2 g/dL Final  . Calcium 03/30/2019 9.0  8.4 - 10.5 mg/dL Final  . GFR 03/30/2019 58.82* >60.00 mL/min Final  . WBC 03/30/2019 8.7  4.0 - 10.5 K/uL Final  . RBC 03/30/2019 4.26  3.87 - 5.11 Mil/uL Final  . Hemoglobin 03/30/2019 12.8  12.0 - 15.0 g/dL Final  . HCT 03/30/2019 38.4  36.0 - 46.0 % Final  . MCV 03/30/2019 90.2  78.0 - 100.0 fl Final  . MCHC 03/30/2019 33.3  30.0 - 36.0 g/dL Final  . RDW 03/30/2019 13.8  11.5 - 15.5 % Final  . Platelets 03/30/2019 262.0  150.0 - 400.0 K/uL Final  . Neutrophils Relative % 03/30/2019 65.7  43.0 - 77.0 % Final  . Lymphocytes Relative 03/30/2019 27.1  12.0 - 46.0 % Final  . Monocytes Relative 03/30/2019 5.5  3.0 - 12.0 % Final  . Eosinophils Relative 03/30/2019 1.3  0.0 - 5.0 % Final  . Basophils Relative  03/30/2019 0.4  0.0 - 3.0 % Final  . Neutro Abs 03/30/2019 5.8  1.4 - 7.7 K/uL Final  . Lymphs Abs 03/30/2019 2.4  0.7 - 4.0 K/uL Final  . Monocytes Absolute 03/30/2019 0.5  0.1 - 1.0 K/uL Final  . Eosinophils Absolute 03/30/2019 0.1  0.0 - 0.7 K/uL Final  . Basophils Absolute 03/30/2019 0.0  0.0 - 0.1 K/uL Final  . Direct LDL 03/30/2019 94.0  mg/dL Final   Optimal:  <161<100 mg/dLNear or Above Optimal:  100-129 mg/dLBorderline High:  130-159 mg/dLHigh:  160-189 mg/dLVery High:  >190 mg/dL    Physical Examination:  BP 140/80 (BP Location: Left Arm, Patient Position: Sitting, Cuff Size: Normal)   Pulse (!) 101   Ht 5' 1.25" (1.556 m)   Wt 163 lb (73.9 kg)   SpO2 98%   BMI 30.55 kg/m     ASSESSMENT:  Diabetes type 2 with poor control  See history of present illness for detailed discussion of current diabetes management, blood sugar patterns and problems identified  Her A1c is 6.4, previously 7.9  Her blood sugars are significantly improved With low-dose insulin but also with increasing her Metformin to 1500 mg a day her blood sugars are excellent as judged by her A1c and home readings by recall However she has not lost any weight and is likely gaining some weight especially with not being able to use Jardiance She reportedly has done some readings after meals and only rarely high if she is off her diet  HYPERTENSION: Blood pressure is controlled and she will continue same regimen   PLAN:    No change in medications Given her ideas on indoor exercises that she needs to do to help keep her weight down No change in insulin as yet Continue Metformin twice daily and will recheck her renal function on the next visit  There are no Patient Instructions on file for this visit.    Reather LittlerAjay Steffan Caniglia 05/24/2019, 1:29 PM   Note: This office note was prepared with Dragon voice recognition system technology. Any transcriptional errors that result from this process are unintentional.

## 2019-05-30 ENCOUNTER — Other Ambulatory Visit: Payer: Self-pay | Admitting: Endocrinology

## 2019-06-28 ENCOUNTER — Encounter: Payer: Self-pay | Admitting: Adult Health

## 2019-06-28 ENCOUNTER — Ambulatory Visit: Payer: Medicare Other | Admitting: Adult Health

## 2019-06-28 ENCOUNTER — Other Ambulatory Visit: Payer: Self-pay

## 2019-06-28 VITALS — BP 152/74 | HR 96 | Temp 96.9°F | Ht 61.25 in | Wt 167.4 lb

## 2019-06-28 DIAGNOSIS — G47411 Narcolepsy with cataplexy: Secondary | ICD-10-CM | POA: Diagnosis not present

## 2019-06-28 NOTE — Progress Notes (Signed)
PATIENT: Amanda King DOB: [birth year]  REASON FOR VISIT: follow up HISTORY FROM: patient  HISTORY OF PRESENT ILLNESS: Today 06/28/19 :  Amanda King is a 79 year old female with a history of narcolepsy with cataplexy.  She returns today for follow-up.  She is currently on Tofranil and Ritalin for narcolepsy.  She reports that this is working well for her.  She denies falling asleep during the day.  She does state occasionally she will lay down for a nap.  She denies falling asleep while driving.  Denies any cataplectic events.  Her blood pressure and heart rate is slightly elevated today.  HISTORY 12/22/18:  Amanda King is a 79 year old female with a history of narcolepsy with cataplexy.  She returns today for follow-up.  She remains on Tofranil and Ritalin for narcolepsy.  She states that she is not had any cataplectic events.  She is no longer working at [employer] 3 days a week.  She denies any trouble falling asleep while at work or driving.  Overall she feels that she is doing relatively well.  She denies any new symptoms.  She returns today for follow-up.   REVIEW OF SYSTEMS: Out of a complete 14 system review of symptoms, the patient complains only of the following symptoms, and all other reviewed systems are negative.  See HPI  ALLERGIES: Allergies  Allergen Reactions  . Penicillins Other (See Comments)    Has patient had a PCN reaction causing immediate rash, facial/tongue/throat swelling, SOB or lightheadedness with hypotension: Unknown Has patient had a PCN reaction causing severe rash involving mucus membranes or skin necrosis: Unknown Has patient had a PCN reaction that required hospitalization: Unknown Has patient had a PCN reaction occurring within the last 10 years: No If all of the above answers are "NO", then may proceed with Cephalosporin use.     HOME MEDICATIONS: Outpatient Medications Prior to Visit  Medication Sig Dispense Refill  . cetirizine  (ZYRTEC) 10 MG tablet Take 10 mg by mouth daily as needed for allergies or rhinitis.     . EUTHYROX 75 MCG tablet TAKE 1 TABLET BY MOUTH ONCE DAILY BEFORE BREAKFAST. 30 tablet 0  . glipiZIDE (GLUCOTROL XL) 10 MG 24 hr tablet Take 1 tablet by mouth once daily with breakfast 90 tablet 0  . glucose blood test strip Use onetouch verio test strip as instructed to check blood sugar once daily. 100 each 1  . hydrochlorothiazide (HYDRODIURIL) 25 MG tablet Take 0.5 tablets (12.5 mg total) by mouth daily. 45 tablet 3  . imipramine (TOFRANIL) 50 MG tablet TAKE 1 TABLET BY MOUTH AT BEDTIME 30 tablet 2  . Insulin Glargine (LANTUS) 100 UNIT/ML Solostar Pen Start with 8 units once a day and increase as directed (Patient taking differently: Inject 10 Units into the skin at bedtime. Inject 10 units under the skin once daily.) 15 mL 11  . insulin lispro (HUMALOG KWIKPEN) 100 UNIT/ML KwikPen 4 units twice daily before meals (Patient taking differently: Inject 3 Units into the skin daily. 3 units once daily before supper.) 15 mL 1  . Insulin Pen Needle (B-D UF III MINI PEN NEEDLES) 31G X 5 MM MISC Use to inject insulin twice daily. 100 each 2  . lisinopril (ZESTRIL) 20 MG tablet Take 1 tablet (20 mg total) by mouth daily. 90 tablet 3  . metFORMIN (GLUCOPHAGE-XR) 750 MG 24 hr tablet Take 1 tablet (750 mg total) by mouth 2 (two) times daily. 180 tablet 3  . methylphenidate (RITALIN)  20 MG tablet Take 0.5 tablets (10 mg total) by mouth 3 (three) times daily with meals. 135 tablet 0  . OneTouch Delica Lancets 30G MISC 1 each by Does not apply route See admin instructions. Use onetouch delica lancet to check blood sugar once daily. 50 each 1  . polyethylene glycol (MIRALAX / GLYCOLAX) packet Take 17 g by mouth daily as needed. 14 each 0  . Probiotic Product (ALIGN) 4 MG CAPS Take by mouth daily.    . simvastatin (ZOCOR) 80 MG tablet Take 1 tablet (80 mg total) by mouth at bedtime. 90 tablet 3   No facility-administered  medications prior to visit.    PAST MEDICAL HISTORY: Past Medical History:  Diagnosis Date  . Diabetes mellitus    Type II  . Hyperlipidemia   . Hypertension   . Hypothyroidism   . Migraine   . Narcolepsy with cataplexy   . Pneumonia 08/2001    PAST SURGICAL HISTORY: Past Surgical History:  Procedure Laterality Date  . APPENDECTOMY  age 53  . BIOPSY  05/24/2018   Procedure: BIOPSY;  Surgeon: Meridee Score Netty Starring., MD;  Location: [facility] ENDOSCOPY;  Service: Gastroenterology;;  . COLONOSCOPY N/A 05/24/2018   Procedure: COLONOSCOPY;  Surgeon: Lemar Lofty., MD;  Location: [facility] ENDOSCOPY;  Service: Gastroenterology;  Laterality: N/A;  . hemilaminectomy     C5-6; C6-7  . PARTIAL HYSTERECTOMY  age 62  . POSTERIOR CERVICAL FUSION/FORAMINOTOMY  02/05/1975   C5-C6-C7    FAMILY HISTORY: Family History  Problem Relation Age of Onset  . Depression Brother        suicide  . Diabetes Brother   . Heart disease Mother        MI  . Peripheral vascular disease Mother   . Hyperlipidemia Mother   . Diabetes Brother   . Sleep apnea Son   . Diabetes Son   . Sleep apnea Brother     SOCIAL HISTORY: Social History   Socioeconomic History  . Marital status: Widowed    Spouse name: Not on file  . Number of children: 3  . Years of education: GED  . Highest education level: Not on file  Occupational History  . Occupation: works 1 day a week  Tobacco Use  . Smoking status: Former Smoker    Quit date: 06/09/1980    Years since quitting: 39.0  . Smokeless tobacco: Never Used  Substance and Sexual Activity  . Alcohol use: No    Alcohol/week: 0.0 standard drinks  . Drug use: No  . Sexual activity: Never  Other Topics Concern  . Not on file  Social History Narrative   Patient is widowed. and her son lives with her.   Walks for exercise.     Patient has three children.   Patient is right-handed.   Patient drinks three cups of caffeine daily.     Patient is retired.   Patient  has a GED.   Social Determinants of Health   Financial Resource Strain: Low Risk   . Difficulty of Paying Living Expenses: Not hard at all  Food Insecurity: No Food Insecurity  . Worried About Programme researcher, broadcasting/film/video in the Last Year: Never true  . Ran Out of Food in the Last Year: Never true  Transportation Needs: No Transportation Needs  . Lack of Transportation (Medical): No  . Lack of Transportation (Non-Medical): No  Physical Activity: Inactive  . Days of Exercise per Week: 0 days  . Minutes of Exercise per Session:  0 min  Stress: No Stress Concern Present  . Feeling of Stress : Not at all  Social Connections:   . Frequency of Communication with Friends and Family: Not on file  . Frequency of Social Gatherings with Friends and Family: Not on file  . Attends Religious Services: Not on file  . Active Member of Clubs or Organizations: Not on file  . Attends Archivist Meetings: Not on file  . Marital Status: Not on file  Intimate Partner Violence: Not At Risk  . Fear of Current or Ex-Partner: No  . Emotionally Abused: No  . Physically Abused: No  . Sexually Abused: No      PHYSICAL EXAM  Vitals:   06/28/19 0850 06/28/19 0931  BP: (!) 152/74   Pulse: (!) 102 96  Temp: (!) 96.9 F (36.1 C)   TempSrc: Oral   Weight: 167 lb 6.4 oz (75.9 kg)   Height: 5' 1.25" (1.556 m)    Body mass index is 31.37 kg/m.  Generalized: Well developed, in no acute distress   Neurological examination  Mentation: Alert oriented to time, place, history taking. Follows all commands speech and language fluent Cranial nerve II-XII: Pupils were equal round reactive to light. Extraocular movements were full, visual field were full on confrontational test. . Head turning and shoulder shrug  were normal and symmetric. Motor: The motor testing reveals 5 over 5 strength of all 4 extremities. Good symmetric motor tone is noted throughout.  Sensory: Sensory testing is intact to soft touch on  all 4 extremities. No evidence of extinction is noted.  Coordination: Cerebellar testing reveals good finger-nose-finger and heel-to-shin bilaterally.  Gait and station: Gait is normal.  Reflexes: Deep tendon reflexes are symmetric and normal bilaterally.   DIAGNOSTIC DATA (LABS, IMAGING, TESTING) - I reviewed patient records, labs, notes, testing and imaging myself where available.  Lab Results  Component Value Date   WBC 8.7 03/30/2019   HGB 12.8 03/30/2019   HCT 38.4 03/30/2019   MCV 90.2 03/30/2019   PLT 262.0 03/30/2019      Component Value Date/Time   NA 139 03/30/2019 0816   K 4.3 03/30/2019 0816   CL 101 03/30/2019 0816   CO2 30 03/30/2019 0816   GLUCOSE 116 (H) 03/30/2019 0816   BUN 21 03/30/2019 0816   CREATININE 0.92 03/30/2019 0816   CALCIUM 9.0 03/30/2019 0816   PROT 6.9 03/30/2019 0816   ALBUMIN 4.5 03/30/2019 0816   AST 20 03/30/2019 0816   ALT 27 03/30/2019 0816   ALKPHOS 83 03/30/2019 0816   BILITOT 0.4 03/30/2019 0816   GFRNONAA >60 05/24/2018 0454   GFRAA >60 05/24/2018 0454   Lab Results  Component Value Date   CHOL 186 03/30/2019   HDL 44.30 03/30/2019   LDLCALC 73 06/21/2014   LDLDIRECT 94.0 03/30/2019   TRIG 229.0 (H) 03/30/2019   CHOLHDL 4 03/30/2019   Lab Results  Component Value Date   HGBA1C 6.4 (A) 05/24/2019   No results found for: VITAMINB12 Lab Results  Component Value Date   TSH 0.94 03/30/2019      ASSESSMENT AND PLAN 79 y.o. year old female  has a past medical history of Diabetes mellitus, Hyperlipidemia, Hypertension, Hypothyroidism, Migraine, Narcolepsy with cataplexy, and Pneumonia (08/2001). here with:  1.  Narcolepsy with cataplexy  -Continue Tofranil and Ritalin -Continue to monitor blood pressure and heart rate, heart rate did normalize while at the visit -Advised that if symptoms worsen or she develops new symptoms she  should let us know.   -She will follow-up in 6 months or sooner if needed   I spent 15  minutes with the patient. 50% of this time was spent reviewing plan of care   Butch Penny, MSN, NP-C 06/28/2019, 9:24 AM [facility] [address], [address] [place], [place] [address] [phone]

## 2019-06-28 NOTE — Patient Instructions (Signed)
Your Plan:  Continue Ritalin and Tofranil  If your symptoms worsen or you develop new symptoms please let us know.    Thank you for coming to see [place] at [facility]. I hope we have been able to provide you high quality care today.  You may receive a patient satisfaction survey over the next few weeks. We would appreciate your feedback and comments so that we may continue to improve ourselves and the health of our patients.

## 2019-07-04 ENCOUNTER — Other Ambulatory Visit: Payer: Self-pay | Admitting: Endocrinology

## 2019-07-28 ENCOUNTER — Other Ambulatory Visit: Payer: Self-pay | Admitting: Adult Health

## 2019-07-28 DIAGNOSIS — G47411 Narcolepsy with cataplexy: Secondary | ICD-10-CM

## 2019-07-31 ENCOUNTER — Other Ambulatory Visit: Payer: Self-pay | Admitting: Endocrinology

## 2019-08-23 ENCOUNTER — Other Ambulatory Visit: Payer: Self-pay

## 2019-08-23 ENCOUNTER — Encounter: Payer: Self-pay | Admitting: Endocrinology

## 2019-08-23 ENCOUNTER — Ambulatory Visit: Payer: Medicare Other | Admitting: Endocrinology

## 2019-08-23 VITALS — BP 142/70 | HR 68 | Ht 61.25 in | Wt 165.0 lb

## 2019-08-23 DIAGNOSIS — I10 Essential (primary) hypertension: Secondary | ICD-10-CM | POA: Diagnosis not present

## 2019-08-23 DIAGNOSIS — E063 Autoimmune thyroiditis: Secondary | ICD-10-CM

## 2019-08-23 DIAGNOSIS — E1165 Type 2 diabetes mellitus with hyperglycemia: Secondary | ICD-10-CM

## 2019-08-23 LAB — POCT GLYCOSYLATED HEMOGLOBIN (HGB A1C): Hemoglobin A1C: 6.7 % — AB (ref 4.0–5.6)

## 2019-08-23 LAB — COMPREHENSIVE METABOLIC PANEL
ALT: 25 U/L (ref 0–35)
AST: 18 U/L (ref 0–37)
Albumin: 4.6 g/dL (ref 3.5–5.2)
Alkaline Phosphatase: 87 U/L (ref 39–117)
BUN: 21 mg/dL (ref 6–23)
CO2: 30 mEq/L (ref 19–32)
Calcium: 9.5 mg/dL (ref 8.4–10.5)
Chloride: 98 mEq/L (ref 96–112)
Creatinine, Ser: 0.95 mg/dL (ref 0.40–1.20)
GFR: 56.63 mL/min — ABNORMAL LOW (ref >60.00)
Glucose, Bld: 111 mg/dL — ABNORMAL HIGH (ref 70–99)
Potassium: 4.4 mEq/L (ref 3.5–5.1)
Sodium: 136 mEq/L (ref 135–145)
Total Bilirubin: 0.3 mg/dL (ref 0.2–1.2)
Total Protein: 7.6 g/dL (ref 6.0–8.3)

## 2019-08-23 LAB — TSH: TSH: 2.57 u[IU]/mL (ref 0.35–4.50)

## 2019-08-23 LAB — T4, FREE: Free T4: 0.91 ng/dL (ref 0.60–1.60)

## 2019-08-23 MED ORDER — LEVOTHYROXINE SODIUM 75 MCG PO TABS: ORAL_TABLET | ORAL | 1 refills | Status: DC

## 2019-08-23 NOTE — Progress Notes (Signed)
Patient ID: Amanda King, female   DOB: 1941/03/11, 79 y.o.   MRN: [id number]           Reason for Appointment: Follow-up for Type 2 Diabetes  Referring PCP: Dr. Milinda Antis   History of Present Illness:          Date of diagnosis of type 2 diabetes mellitus:  2008      Background history:  She was initially treated with metformin and subsequently glipizide added Januvia was added in 12/2016 She has had variable control with A1c as high as 10.3 done in 2015 and as low as 7.3 done in April 2016 Has generally been followed by her PCP about twice a year  Recent history:     Non-insulin hypoglycemic drugs:  glipizide ER 10 mg daily, metformin ER 750 mg twice daily  INSULIN doses: Lantus 10 units daily acs, Humalog 3 units at supper  Her A1c is consistently below 7% and now 6.7 compared to 6.4 and 7.9 previously  Current management, blood sugar patterns and problems identified:  She did bring her monitor for download today  Has been checking blood sugar somewhat infrequently and mostly in the mornings  Also not clear with her monitoring which readings are before or after eating in the mornings  She has a couple of readings at night which are excellent at bedtime  Although she has not been doing much walking over the winter months she is now trying to use a recumbent bike and some weights for exercise in the last few days  She has gained a couple of pounds  No hypoglycemic symptoms  She has previously seen the dietitian and she thinks she is usually watching her diet        Side effects from medications have been: Diarrhea from high dose metformin            Glucose monitoring:  done less than 1 times a day         Glucometer:  One Touch       Blood Glucose readings by time of day from    PRE-MEAL Fasting  midday Dinner Bedtime Overall  Glucose range:  105-160  122-158   103, 134   Mean/median:  131  138   133   POST-MEAL PC Breakfast PC Lunch PC Dinner  Glucose  range:  102-160    Mean/median:      Previous data:  PRE-MEAL Fasting Lunch Dinner Bedtime Overall  Glucose range: 101-106  74 130-146   Mean/median:           Dietician visit, most recent: 08/12/2018  Weight history:  Wt Readings from Last 3 Encounters:  08/23/19 165 lb (74.8 kg)  06/28/19 167 lb 6.4 oz (75.9 kg)  05/24/19 163 lb (73.9 kg)    Glycemic control:   Lab Results  Component Value Date   HGBA1C 6.7 (A) 08/23/2019   HGBA1C 6.4 (A) 05/24/2019   HGBA1C 7.9 (A) 02/22/2019   Lab Results  Component Value Date   MICROALBUR <0.7 02/22/2019   LDLCALC 73 06/21/2014   CREATININE 0.92 03/30/2019   Lab Results  Component Value Date   MICRALBCREAT 1.6 02/22/2019    Lab Results  Component Value Date   FRUCTOSAMINE 267 11/22/2018   FRUCTOSAMINE 349 (H) 08/18/2018    Office Visit on 08/23/2019  Component Date Value Ref Range Status  . Hemoglobin A1C 08/23/2019 6.7* 4.0 - 5.6 % Final    Allergies as of 08/23/2019  Reactions   Penicillins Other (See Comments)   Has patient had a PCN reaction causing immediate rash, facial/tongue/throat swelling, SOB or lightheadedness with hypotension: Unknown Has patient had a PCN reaction causing severe rash involving mucus membranes or skin necrosis: Unknown Has patient had a PCN reaction that required hospitalization: Unknown Has patient had a PCN reaction occurring within the last 10 years: No If all of the above answers are "NO", then may proceed with Cephalosporin use.      Medication List       Accurate as of August 23, 2019  2:42 PM. If you have any questions, ask your nurse or doctor.        Align 4 MG Caps Take by mouth daily.   B-D UF III MINI PEN NEEDLES 31G X 5 MM Misc Generic drug: Insulin Pen Needle Use to inject insulin twice daily.   cetirizine 10 MG tablet Commonly known as: ZYRTEC Take 10 mg by mouth daily as needed for allergies or rhinitis.   glipiZIDE 10 MG 24 hr tablet Commonly known as:  GLUCOTROL XL Take 1 tablet by mouth once daily with breakfast   glucose blood test strip Use onetouch verio test strip as instructed to check blood sugar once daily.   hydrochlorothiazide 25 MG tablet Commonly known as: HYDRODIURIL Take 0.5 tablets (12.5 mg total) by mouth daily.   imipramine 50 MG tablet Commonly known as: TOFRANIL TAKE 1 TABLET BY MOUTH AT BEDTIME   insulin glargine 100 UNIT/ML Solostar Pen Commonly known as: LANTUS Start with 8 units once a day and increase as directed What changed:   how much to take  how to take this  when to take this  additional instructions   insulin lispro 100 UNIT/ML KwikPen Commonly known as: HumaLOG KwikPen 4 units twice daily before meals What changed:   how much to take  how to take this  when to take this  additional instructions   levothyroxine 75 MCG tablet Commonly known as: Euthyrox TAKE 1 TABLET BY MOUTH ONCE DAILY BEFORE BREAKFAST   lisinopril 20 MG tablet Commonly known as: ZESTRIL Take 1 tablet (20 mg total) by mouth daily.   metFORMIN 750 MG 24 hr tablet Commonly known as: GLUCOPHAGE-XR Take 1 tablet (750 mg total) by mouth 2 (two) times daily.   methylphenidate 20 MG tablet Commonly known as: RITALIN Take 0.5 tablets (10 mg total) by mouth 3 (three) times daily with meals.   OneTouch Delica Lancets 30G Misc 1 each by Does not apply route See admin instructions. Use onetouch delica lancet to check blood sugar once daily.   polyethylene glycol 17 g packet Commonly known as: MIRALAX / GLYCOLAX Take 17 g by mouth daily as needed.   simvastatin 80 MG tablet Commonly known as: ZOCOR Take 1 tablet (80 mg total) by mouth at bedtime.       Allergies:  Allergies  Allergen Reactions  . Penicillins Other (See Comments)    Has patient had a PCN reaction causing immediate rash, facial/tongue/throat swelling, SOB or lightheadedness with hypotension: Unknown Has patient had a PCN reaction causing  severe rash involving mucus membranes or skin necrosis: Unknown Has patient had a PCN reaction that required hospitalization: Unknown Has patient had a PCN reaction occurring within the last 10 years: No If all of the above answers are "NO", then may proceed with Cephalosporin use.     Past Medical History:  Diagnosis Date  . Diabetes mellitus    Type II  .  Hyperlipidemia   . Hypertension   . Hypothyroidism   . Migraine   . Narcolepsy with cataplexy   . Pneumonia 08/2001    Past Surgical History:  Procedure Laterality Date  . APPENDECTOMY  age 61  . BIOPSY  05/24/2018   Procedure: BIOPSY;  Surgeon: Rush Landmark Telford Nab., MD;  Location: [place];  Service: Gastroenterology;;  . COLONOSCOPY N/A 05/24/2018   Procedure: COLONOSCOPY;  Surgeon: Irving Copas., MD;  Location: [place];  Service: Gastroenterology;  Laterality: N/A;  . hemilaminectomy     C5-6; C6-7  . PARTIAL HYSTERECTOMY  age 19  . POSTERIOR CERVICAL FUSION/FORAMINOTOMY  02/05/1975   C5-C6-C7    Family History  Problem Relation Age of Onset  . Depression Brother        suicide  . Diabetes Brother   . Heart disease Mother        MI  . Peripheral vascular disease Mother   . Hyperlipidemia Mother   . Diabetes Brother   . Sleep apnea Son   . Diabetes Son   . Sleep apnea Brother     Social History:  reports that she quit smoking about 39 years ago. She has never used smokeless tobacco. She reports that she does not drink alcohol or use drugs.   Review of Systems   Lipid history: Has been treated by her PCP with simvastatin 80 mg Has high triglycerides   Lab Results  Component Value Date   CHOL 186 03/30/2019   HDL 44.30 03/30/2019   LDLCALC 73 06/21/2014   LDLDIRECT 94.0 03/30/2019   TRIG 229.0 (H) 03/30/2019   CHOLHDL 4 03/30/2019           Hypertension: Has been treated with HCTZ, half tablet and lisinopril by her PCP  She does not monitor at home  BP Readings from Last 3  Encounters:  08/23/19 (!) 142/70  06/28/19 (!) 152/74  05/24/19 140/80    Most recent eye exam was in 8/20  Most recent foot exam:2/20   Hypothyroidism: Taking 75 mcg of levothyroxine and last TSH was normal No recent complaints of fatigue  Lab Results  Component Value Date   TSH 0.94 03/30/2019   TSH 0.21 (L) 02/22/2019   TSH 1.34 03/09/2018   FREET4 1.00 02/22/2019      LABS:  Office Visit on 08/23/2019  Component Date Value Ref Range Status  . Hemoglobin A1C 08/23/2019 6.7* 4.0 - 5.6 % Final    Physical Examination:  BP (!) 142/70 (BP Location: Left Arm, Patient Position: Sitting, Cuff Size: Normal)   Pulse 68   Ht 5' 1.25" (1.556 m)   Wt 165 lb (74.8 kg)   SpO2 97%   BMI 30.92 kg/m     ASSESSMENT:  Diabetes type 2 with poor control  See history of present illness for detailed discussion of current diabetes management, blood sugar patterns and problems identified  Her A1c is 6.7  Her blood sugars are consistently improved She may be having slightly higher readings compared to her last visit probably because of lack of consistent exercise or diet However last 2 weeks blood sugars are fairly good although checking blood sugars mostly in the morning She is benefiting from Metformin and tolerating 1500 mg of the extended release She does need to lose some weight and recently starting to do a little more exercise   HYPERTENSION: Blood pressure is controlled but needs follow-up labs for renal function and potassium, currently on 12.5 mg HCTZ  Hypothyroidism: Taking  75 mcg Euthyrox from [facility] and needs follow-up labs Subjectively doing well   PLAN:    She will try to check more readings after meals especially at night about 2 hours after eating Discussed fasting and postprandial targets  Also try to walk regularly as possible Stay on the same dose of insulin and take both together at dinnertime as before She will let us know if she is starting to  get consistently high fasting readings  There are no Patient Instructions on file for this visit.    Reather Littler 08/23/2019, 2:42 PM   Note: This office note was prepared with Dragon voice recognition system technology. Any transcriptional errors that result from this process are unintentional.

## 2019-08-24 ENCOUNTER — Other Ambulatory Visit: Payer: Self-pay | Admitting: Endocrinology

## 2019-09-02 ENCOUNTER — Other Ambulatory Visit: Payer: Self-pay | Admitting: Endocrinology

## 2019-10-06 ENCOUNTER — Other Ambulatory Visit: Payer: Self-pay | Admitting: Endocrinology

## 2019-10-06 ENCOUNTER — Other Ambulatory Visit: Payer: Self-pay | Admitting: Adult Health

## 2019-10-06 DIAGNOSIS — G47411 Narcolepsy with cataplexy: Secondary | ICD-10-CM

## 2019-10-06 NOTE — Telephone Encounter (Signed)
Pt called needing a refill on her methylphenidate (RITALIN) 20 MG tablet and have it sent to the [facility] on [address].

## 2019-10-06 NOTE — Telephone Encounter (Signed)
Pt is up to date on her appts Pt is due for a refill on Ritalin [place] controlled substance registry was checked and is appropriate.

## 2019-10-07 ENCOUNTER — Telehealth: Payer: Self-pay | Admitting: Family Medicine

## 2019-10-07 NOTE — Progress Notes (Signed)
°  Chronic Care Management   Outreach Note  10/07/2019 Name: Amanda King MRN: [id number] DOB: 1940/09/24  Referred by: Tower, Audrie Gallus, MD Reason for referral : No chief complaint on file.   An unsuccessful telephone outreach was attempted today. The patient was referred to the pharmacist for assistance with care management and care coordination.    This note is not being shared with the patient for the following reason: To respect privacy (The patient or proxy has requested that the information not be shared).  Follow Up Plan:   [name] [employer] Scheduler

## 2019-10-10 MED ORDER — METHYLPHENIDATE HCL 20 MG PO TABS: 10.0000 mg | ORAL_TABLET | Freq: Three times a day (TID) | ORAL | 0 refills | Status: DC

## 2019-10-12 ENCOUNTER — Other Ambulatory Visit: Payer: Self-pay | Admitting: Endocrinology

## 2019-10-25 ENCOUNTER — Other Ambulatory Visit: Payer: Self-pay | Admitting: Endocrinology

## 2019-12-03 ENCOUNTER — Other Ambulatory Visit: Payer: Self-pay | Admitting: Endocrinology

## 2019-12-26 ENCOUNTER — Ambulatory Visit: Payer: Medicare Other | Admitting: Adult Health

## 2019-12-26 ENCOUNTER — Encounter: Payer: Self-pay | Admitting: Adult Health

## 2019-12-26 ENCOUNTER — Other Ambulatory Visit: Payer: Self-pay

## 2019-12-26 VITALS — BP 150/70 | HR 100 | Ht 61.25 in | Wt 164.6 lb

## 2019-12-26 DIAGNOSIS — G47411 Narcolepsy with cataplexy: Secondary | ICD-10-CM | POA: Diagnosis not present

## 2019-12-26 NOTE — Patient Instructions (Signed)
Your Plan:  Continue tofranil  Decrease Ritalin to twice a day If your symptoms worsen or you develop new symptoms please let us know.   Thank you for coming to see [place] at [facility]. I hope we have been able to provide you high quality care today.  You may receive a patient satisfaction survey over the next few weeks. We would appreciate your feedback and comments so that we may continue to improve ourselves and the health of our patients.

## 2019-12-26 NOTE — Progress Notes (Signed)
PATIENT: Amanda King DOB: [birth year]  REASON FOR VISIT: follow up HISTORY FROM: patient  HISTORY OF PRESENT ILLNESS: Today 12/26/19:  Amanda King is an 79 year old female with a history of narcolepsy with cataplexy.  She returns today for follow-up.  She remains on Tofranil and Ritalin.  She reports that the combination is working well for her.  Denies any cataplectic events.  Denies falling asleep spontaneously.  Overall she feels that she is doing well.  She returns today for an evaluation.  HISTORY  06/28/19 :  Amanda King is a 79 year old female with a history of narcolepsy with cataplexy.  She returns today for follow-up.  She is currently on Tofranil and Ritalin for narcolepsy.  She reports that this is working well for her.  She denies falling asleep during the day.  She does state occasionally she will lay down for a nap.  She denies falling asleep while driving.  Denies any cataplectic events.  Her blood pressure and heart rate is slightly elevated today.  REVIEW OF SYSTEMS: Out of a complete 14 system review of symptoms, the patient complains only of the following symptoms, and all other reviewed systems are negative.  ALLERGIES: Allergies  Allergen Reactions  . Penicillins Other (See Comments)    Has patient had a PCN reaction causing immediate rash, facial/tongue/throat swelling, SOB or lightheadedness with hypotension: Unknown Has patient had a PCN reaction causing severe rash involving mucus membranes or skin necrosis: Unknown Has patient had a PCN reaction that required hospitalization: Unknown Has patient had a PCN reaction occurring within the last 10 years: No If all of the above answers are "NO", then may proceed with Cephalosporin use.     HOME MEDICATIONS: Outpatient Medications Prior to Visit  Medication Sig Dispense Refill  . B-D UF III MINI PEN NEEDLES 31G X 5 MM MISC USE TO INJECT INSULIN TWICE DAILY 100 each 0  . cetirizine (ZYRTEC) 10 MG tablet Take  10 mg by mouth daily as needed for allergies or rhinitis.     Janann August 75 MCG tablet TAKE 1 TABLET BY MOUTH ONCE DAILY BEFORE BREAKFAST 30 tablet 2  . glipiZIDE (GLUCOTROL XL) 10 MG 24 hr tablet Take 1 tablet by mouth once daily with breakfast 90 tablet 0  . glucose blood test strip Use onetouch verio test strip as instructed to check blood sugar once daily. 100 each 1  . hydrochlorothiazide (HYDRODIURIL) 25 MG tablet Take 0.5 tablets (12.5 mg total) by mouth daily. 45 tablet 3  . imipramine (TOFRANIL) 50 MG tablet TAKE 1 TABLET BY MOUTH AT BEDTIME 30 tablet 5  . insulin glargine (LANTUS SOLOSTAR) 100 UNIT/ML Solostar Pen START WITH 8 UNITS ONCE A DAY AND INCREASE AS DIRECTED 15 mL 0  . insulin lispro (HUMALOG KWIKPEN) 100 UNIT/ML KwikPen 4 units twice daily before meals (Patient taking differently: Inject 3 Units into the skin daily. 3 units once daily before supper.) 15 mL 1  . lisinopril (ZESTRIL) 20 MG tablet Take 1 tablet (20 mg total) by mouth daily. 90 tablet 3  . metFORMIN (GLUCOPHAGE-XR) 750 MG 24 hr tablet Take 1 tablet (750 mg total) by mouth 2 (two) times daily. 180 tablet 3  . methylphenidate (RITALIN) 20 MG tablet Take 0.5 tablets (10 mg total) by mouth 3 (three) times daily with meals. 135 tablet 0  . OneTouch Delica Lancets 30G MISC 1 each by Does not apply route See admin instructions. Use onetouch delica lancet to check blood sugar once daily.  50 each 1  . polyethylene glycol (MIRALAX / GLYCOLAX) packet Take 17 g by mouth daily as needed. 14 each 0  . Probiotic Product (ALIGN) 4 MG CAPS Take by mouth daily.    . simvastatin (ZOCOR) 80 MG tablet Take 1 tablet (80 mg total) by mouth at bedtime. 90 tablet 3   No facility-administered medications prior to visit.    PAST MEDICAL HISTORY: Past Medical History:  Diagnosis Date  . Diabetes mellitus    Type II  . Hyperlipidemia   . Hypertension   . Hypothyroidism   . Migraine   . Narcolepsy with cataplexy   . Pneumonia 08/2001      PAST SURGICAL HISTORY: Past Surgical History:  Procedure Laterality Date  . APPENDECTOMY  age 57  . BIOPSY  05/24/2018   Procedure: BIOPSY;  Surgeon: Meridee Score Netty Starring., MD;  Location: [facility] ENDOSCOPY;  Service: Gastroenterology;;  . COLONOSCOPY N/A 05/24/2018   Procedure: COLONOSCOPY;  Surgeon: Lemar Lofty., MD;  Location: [facility] ENDOSCOPY;  Service: Gastroenterology;  Laterality: N/A;  . hemilaminectomy     C5-6; C6-7  . PARTIAL HYSTERECTOMY  age 59  . POSTERIOR CERVICAL FUSION/FORAMINOTOMY  02/05/1975   C5-C6-C7    FAMILY HISTORY: Family History  Problem Relation Age of Onset  . Depression Brother        suicide  . Diabetes Brother   . Heart disease Mother        MI  . Peripheral vascular disease Mother   . Hyperlipidemia Mother   . Diabetes Brother   . Sleep apnea Son   . Diabetes Son   . Sleep apnea Brother     SOCIAL HISTORY: Social History   Socioeconomic History  . Marital status: Widowed    Spouse name: Not on file  . Number of children: 3  . Years of education: GED  . Highest education level: Not on file  Occupational History  . Occupation: works 1 day a week  Tobacco Use  . Smoking status: Former Smoker    Quit date: 06/09/1980    Years since quitting: 39.5  . Smokeless tobacco: Never Used  Vaping Use  . Vaping Use: Never used  Substance and Sexual Activity  . Alcohol use: No    Alcohol/week: 0.0 standard drinks  . Drug use: No  . Sexual activity: Never  Other Topics Concern  . Not on file  Social History Narrative   Patient is widowed. and her son lives with her.   Walks for exercise.     Patient has three children.   Patient is right-handed.   Patient drinks three cups of caffeine daily.     Patient is retired.   Patient has a GED.   Social Determinants of Health   Financial Resource Strain: Low Risk   . Difficulty of Paying Living Expenses: Not hard at all  Food Insecurity: No Food Insecurity  . Worried About Patent examiner in the Last Year: Never true  . Ran Out of Food in the Last Year: Never true  Transportation Needs: No Transportation Needs  . Lack of Transportation (Medical): No  . Lack of Transportation (Non-Medical): No  Physical Activity: Inactive  . Days of Exercise per Week: 0 days  . Minutes of Exercise per Session: 0 min  Stress: No Stress Concern Present  . Feeling of Stress : Not at all  Social Connections:   . Frequency of Communication with Friends and Family:   . Frequency of Social Gatherings  with Friends and Family:   . Attends Religious Services:   . Active Member of Clubs or Organizations:   . Attends Banker Meetings:   Marland Kitchen Marital Status:   Intimate Partner Violence: Not At Risk  . Fear of Current or Ex-Partner: No  . Emotionally Abused: No  . Physically Abused: No  . Sexually Abused: No      PHYSICAL EXAM  There were no vitals filed for this visit. There is no height or weight on file to calculate BMI.  Generalized: Well developed, in no acute distress   Neurological examination  Mentation: Alert oriented to time, place, history taking. Follows all commands speech and language fluent Cranial nerve II-XII: Pupils were equal round reactive to light. Extraocular movements were full, visual field were full on confrontational test. Facial sensation and strength were normal. Uvula tongue midline. Head turning and shoulder shrug  were normal and symmetric. Motor: The motor testing reveals 5 over 5 strength of all 4 extremities. Good symmetric motor tone is noted throughout.  Sensory: Sensory testing is intact to soft touch on all 4 extremities. No evidence of extinction is noted.  Coordination: Cerebellar testing reveals good finger-nose-finger and heel-to-shin bilaterally.  Gait and station: Gait is normal. Tandem gait is normal. Romberg is negative. No drift is seen.  Reflexes: Deep tendon reflexes are symmetric and normal bilaterally.   DIAGNOSTIC DATA  (LABS, IMAGING, TESTING) - I reviewed patient records, labs, notes, testing and imaging myself where available.  Lab Results  Component Value Date   WBC 8.7 03/30/2019   HGB 12.8 03/30/2019   HCT 38.4 03/30/2019   MCV 90.2 03/30/2019   PLT 262.0 03/30/2019      Component Value Date/Time   NA 136 08/23/2019 1357   K 4.4 08/23/2019 1357   CL 98 08/23/2019 1357   CO2 30 08/23/2019 1357   GLUCOSE 111 (H) 08/23/2019 1357   BUN 21 08/23/2019 1357   CREATININE 0.95 08/23/2019 1357   CALCIUM 9.5 08/23/2019 1357   PROT 7.6 08/23/2019 1357   ALBUMIN 4.6 08/23/2019 1357   AST 18 08/23/2019 1357   ALT 25 08/23/2019 1357   ALKPHOS 87 08/23/2019 1357   BILITOT 0.3 08/23/2019 1357   GFRNONAA >60 05/24/2018 0454   GFRAA >60 05/24/2018 0454   Lab Results  Component Value Date   CHOL 186 03/30/2019   HDL 44.30 03/30/2019   LDLCALC 73 06/21/2014   LDLDIRECT 94.0 03/30/2019   TRIG 229.0 (H) 03/30/2019   CHOLHDL 4 03/30/2019   Lab Results  Component Value Date   HGBA1C 6.7 (A) 08/23/2019   No results found for: VITAMINB12 Lab Results  Component Value Date   TSH 2.57 08/23/2019      ASSESSMENT AND PLAN 79 y.o. year old female  has a past medical history of Diabetes mellitus, Hyperlipidemia, Hypertension, Hypothyroidism, Migraine, Narcolepsy with cataplexy, and Pneumonia (08/2001). here with :  Narcolepsy with cataplexy   Continue Tofranil  Decrease Ritalin to twice a day-if she tolerates this well I will change her prescription to reflect this  Continue to monitor blood pressure and heart rate  Advised if symptoms worsen or she develops new symptoms she should let us know  Follow-up in 6 months or sooner if needed  I spent 20 minutes of face-to-face and non-face-to-face time with patient.  This included previsit chart review, lab review, study review, order entry, electronic health record documentation, patient education.  Butch Penny, MSN, NP-C 12/26/2019, 1:53  PM [facility]  [address], [address], [place] [address] [phone]

## 2019-12-27 ENCOUNTER — Encounter: Payer: Self-pay | Admitting: Endocrinology

## 2019-12-27 ENCOUNTER — Other Ambulatory Visit: Payer: Self-pay

## 2019-12-27 ENCOUNTER — Ambulatory Visit: Payer: Medicare Other | Admitting: Endocrinology

## 2019-12-27 VITALS — BP 140/80 | HR 96 | Ht 61.25 in | Wt 164.8 lb

## 2019-12-27 DIAGNOSIS — I10 Essential (primary) hypertension: Secondary | ICD-10-CM | POA: Diagnosis not present

## 2019-12-27 DIAGNOSIS — E782 Mixed hyperlipidemia: Secondary | ICD-10-CM | POA: Diagnosis not present

## 2019-12-27 DIAGNOSIS — E1165 Type 2 diabetes mellitus with hyperglycemia: Secondary | ICD-10-CM | POA: Diagnosis not present

## 2019-12-27 DIAGNOSIS — E063 Autoimmune thyroiditis: Secondary | ICD-10-CM

## 2019-12-27 LAB — POCT GLYCOSYLATED HEMOGLOBIN (HGB A1C): Hemoglobin A1C: 7 % — AB (ref 4.0–5.6)

## 2019-12-27 LAB — T4, FREE: Free T4: 0.95 ng/dL (ref 0.60–1.60)

## 2019-12-27 LAB — MICROALBUMIN / CREATININE URINE RATIO
Creatinine,U: 32.3 mg/dL
Microalb Creat Ratio: 2.2 mg/g (ref 0.0–30.0)
Microalb, Ur: <0.7 mg/dL (ref 0.0–1.9)

## 2019-12-27 LAB — COMPREHENSIVE METABOLIC PANEL
ALT: 29 U/L (ref 0–35)
AST: 21 U/L (ref 0–37)
Albumin: 4.7 g/dL (ref 3.5–5.2)
Alkaline Phosphatase: 81 U/L (ref 39–117)
BUN: 20 mg/dL (ref 6–23)
CO2: 30 mEq/L (ref 19–32)
Calcium: 9.3 mg/dL (ref 8.4–10.5)
Chloride: 99 mEq/L (ref 96–112)
Creatinine, Ser: 0.97 mg/dL (ref 0.40–1.20)
GFR: 55.23 mL/min — ABNORMAL LOW (ref >60.00)
Glucose, Bld: 88 mg/dL (ref 70–99)
Potassium: 4 mEq/L (ref 3.5–5.1)
Sodium: 137 mEq/L (ref 135–145)
Total Bilirubin: 0.5 mg/dL (ref 0.2–1.2)
Total Protein: 7.4 g/dL (ref 6.0–8.3)

## 2019-12-27 LAB — LIPID PANEL
Cholesterol: 144 mg/dL (ref 0–200)
HDL: 44.6 mg/dL (ref >39.00)
LDL Cholesterol: 61 mg/dL (ref 0–99)
NonHDL: 99.7
Total CHOL/HDL Ratio: 3
Triglycerides: 194 mg/dL — ABNORMAL HIGH (ref 0.0–149.0)
VLDL: 38.8 mg/dL (ref 0.0–40.0)

## 2019-12-27 LAB — TSH: TSH: 1.04 u[IU]/mL (ref 0.35–4.50)

## 2019-12-27 NOTE — Progress Notes (Signed)
Patient ID: Amanda King, female   DOB: 12-28-1940, 79 y.o.   MRN: [id number]           Reason for Appointment: Follow-up for Type 2 Diabetes  Referring PCP: Dr. Milinda Antis   History of Present Illness:          Date of diagnosis of type 2 diabetes mellitus:  2008      Background history:  She was initially treated with metformin and subsequently glipizide added Januvia was added in 12/2016 She has had variable control with A1c as high as 10.3 done in 2015 and as low as 7.3 done in April 2016 Has generally been followed by her PCP about twice a year  She was tried on [place] in 2020  Recent history:     Non-insulin hypoglycemic drugs:  glipizide ER 10 mg daily, metformin ER 750 mg twice daily  INSULIN doses: Lantus 10 units daily acs, Humalog 3 units at supper  Her A1c is previously below 7% and now gradually increasing, now 7%  Current management, blood sugar patterns and problems identified:  She did bring her monitor for download  However has not checked readings after meals as directed especially after dinner  She is not being consistent with her diet although she has not gained any weight  She will sometimes get off her diet with eating pizza, watermelon or ice cream  However except for the last couple of days her blood sugars have been mostly 160 or more at breakfast time  Blood sugars are relatively better in the afternoons  As before she is trying to use a recumbent bike and some weights for exercise  She thinks that she has increased her abdominal girth  She does not think she wants to see the dietitian  She is planning to start drinking smoothies as meal substitutes  Dinner 6-7 p.m. usually       Side effects from medications have been: Diarrhea from high dose metformin            Glucose monitoring:  done less than 1 times a day         Glucometer:  One Touch       Blood Glucose readings by time of day    PRE-MEAL Fasting Lunch  afternoons  Bedtime Overall  Glucose range:  138-210  107-222  80, 82    Mean/median:  165    152   POST-MEAL PC Breakfast PC Lunch PC Dinner  Glucose range: ?  ?  Mean/median:      Prior:   PRE-MEAL Fasting  midday Dinner Bedtime Overall  Glucose range:  105-160  122-158   103, 134   Mean/median:  131  138   133   POST-MEAL PC Breakfast PC Lunch PC Dinner  Glucose range:  102-160    Mean/median:          Dietician visit, most recent: 08/12/2018  Weight history:  Wt Readings from Last 3 Encounters:  12/27/19 164 lb 12.8 oz (74.8 kg)  12/26/19 164 lb 9.6 oz (74.7 kg)  08/23/19 165 lb (74.8 kg)    Glycemic control:   Lab Results  Component Value Date   HGBA1C 7.0 (A) 12/27/2019   HGBA1C 6.7 (A) 08/23/2019   HGBA1C 6.4 (A) 05/24/2019   Lab Results  Component Value Date   MICROALBUR <0.7 02/22/2019   LDLCALC 73 06/21/2014   CREATININE 0.95 08/23/2019   Lab Results  Component Value Date   MICRALBCREAT 1.6 02/22/2019  Lab Results  Component Value Date   FRUCTOSAMINE 267 11/22/2018   FRUCTOSAMINE 349 (H) 08/18/2018    Office Visit on 12/27/2019  Component Date Value Ref Range Status  . Hemoglobin A1C 12/27/2019 7.0* 4.0 - 5.6 % Final    Allergies as of 12/27/2019      Reactions   Penicillins Other (See Comments)   Has patient had a PCN reaction causing immediate rash, facial/tongue/throat swelling, SOB or lightheadedness with hypotension: Unknown Has patient had a PCN reaction causing severe rash involving mucus membranes or skin necrosis: Unknown Has patient had a PCN reaction that required hospitalization: Unknown Has patient had a PCN reaction occurring within the last 10 years: No If all of the above answers are "NO", then may proceed with Cephalosporin use.      Medication List       Accurate as of December 27, 2019  1:48 PM. If you have any questions, ask your nurse or doctor.        Align 4 MG Caps Take by mouth daily.   B-D UF III MINI PEN NEEDLES 31G  X 5 MM Misc Generic drug: Insulin Pen Needle USE TO INJECT INSULIN TWICE DAILY   cetirizine 10 MG tablet Commonly known as: ZYRTEC Take 10 mg by mouth daily as needed for allergies or rhinitis.   Euthyrox 75 MCG tablet Generic drug: levothyroxine TAKE 1 TABLET BY MOUTH ONCE DAILY BEFORE BREAKFAST   glipiZIDE 10 MG 24 hr tablet Commonly known as: GLUCOTROL XL Take 1 tablet by mouth once daily with breakfast   glucose blood test strip Use onetouch verio test strip as instructed to check blood sugar once daily.   hydrochlorothiazide 25 MG tablet Commonly known as: HYDRODIURIL Take 0.5 tablets (12.5 mg total) by mouth daily.   imipramine 50 MG tablet Commonly known as: TOFRANIL TAKE 1 TABLET BY MOUTH AT BEDTIME   insulin lispro 100 UNIT/ML KwikPen Commonly known as: HumaLOG KwikPen 4 units twice daily before meals What changed:   how much to take  how to take this  when to take this  additional instructions   Lantus SoloStar 100 UNIT/ML Solostar Pen Generic drug: insulin glargine START WITH 8 UNITS ONCE A DAY AND INCREASE AS DIRECTED What changed:   how much to take  additional instructions   lisinopril 20 MG tablet Commonly known as: ZESTRIL Take 1 tablet (20 mg total) by mouth daily.   metFORMIN 750 MG 24 hr tablet Commonly known as: GLUCOPHAGE-XR Take 1 tablet (750 mg total) by mouth 2 (two) times daily.   methylphenidate 20 MG tablet Commonly known as: RITALIN Take 0.5 tablets (10 mg total) by mouth 3 (three) times daily with meals.   OneTouch Delica Lancets 30G Misc 1 each by Does not apply route See admin instructions. Use onetouch delica lancet to check blood sugar once daily.   polyethylene glycol 17 g packet Commonly known as: MIRALAX / GLYCOLAX Take 17 g by mouth daily as needed.   simvastatin 80 MG tablet Commonly known as: ZOCOR Take 1 tablet (80 mg total) by mouth at bedtime.       Allergies:  Allergies  Allergen Reactions  .  Penicillins Other (See Comments)    Has patient had a PCN reaction causing immediate rash, facial/tongue/throat swelling, SOB or lightheadedness with hypotension: Unknown Has patient had a PCN reaction causing severe rash involving mucus membranes or skin necrosis: Unknown Has patient had a PCN reaction that required hospitalization: Unknown Has patient had a PCN  reaction occurring within the last 10 years: No If all of the above answers are "NO", then may proceed with Cephalosporin use.     Past Medical History:  Diagnosis Date  . Diabetes mellitus    Type II  . Hyperlipidemia   . Hypertension   . Hypothyroidism   . Migraine   . Narcolepsy with cataplexy   . Pneumonia 08/2001    Past Surgical History:  Procedure Laterality Date  . APPENDECTOMY  age 67  . BIOPSY  05/24/2018   Procedure: BIOPSY;  Surgeon: Meridee Score Netty Starring., MD;  Location: [facility] ENDOSCOPY;  Service: Gastroenterology;;  . COLONOSCOPY N/A 05/24/2018   Procedure: COLONOSCOPY;  Surgeon: Lemar Lofty., MD;  Location: [facility] ENDOSCOPY;  Service: Gastroenterology;  Laterality: N/A;  . hemilaminectomy     C5-6; C6-7  . PARTIAL HYSTERECTOMY  age 29  . POSTERIOR CERVICAL FUSION/FORAMINOTOMY  02/05/1975   C5-C6-C7    Family History  Problem Relation Age of Onset  . Depression Brother        suicide  . Diabetes Brother   . Heart disease Mother        MI  . Peripheral vascular disease Mother   . Hyperlipidemia Mother   . Diabetes Brother   . Sleep apnea Son   . Diabetes Son   . Sleep apnea Brother     Social History:  reports that she quit smoking about 39 years ago. She has never used smokeless tobacco. She reports that she does not drink alcohol and does not use drugs.   Review of Systems   Lipid history: Has been treated by her PCP with simvastatin 80 mg Has had high triglycerides   Lab Results  Component Value Date   CHOL 186 03/30/2019   HDL 44.30 03/30/2019   LDLCALC 73 06/21/2014    LDLDIRECT 94.0 03/30/2019   TRIG 229.0 (H) 03/30/2019   CHOLHDL 4 03/30/2019           Hypertension: Has been treated with HCTZ, 12.5 mg lisinopril 20 mg by her PCP  She does not monitor at home  BP Readings from Last 3 Encounters:  12/27/19 140/80  12/26/19 (!) 150/70  08/23/19 (!) 142/70    Most recent eye exam was in 8/20  Most recent foot exam: 7/21   Hypothyroidism: Taking 75 mcg of levothyroxine and last TSH was normal She does not feel tired or off balance  Lab Results  Component Value Date   TSH 2.57 08/23/2019   TSH 0.94 03/30/2019   TSH 0.21 (L) 02/22/2019   FREET4 0.91 08/23/2019   FREET4 1.00 02/22/2019      LABS:  Office Visit on 12/27/2019  Component Date Value Ref Range Status  . Hemoglobin A1C 12/27/2019 7.0* 4.0 - 5.6 % Final    Physical Examination:  BP 140/80 (BP Location: Left Arm, Patient Position: Sitting, Cuff Size: Normal)   Pulse 96   Ht 5' 1.25" (1.556 m)   Wt 164 lb 12.8 oz (74.8 kg)   SpO2 97%   BMI 30.89 kg/m   Diabetic Foot Exam - Simple   Simple Foot Form Diabetic Foot exam was performed with the following findings: Yes   Visual Inspection No deformities, no ulcerations, no other skin breakdown bilaterally: Yes Sensation Testing Intact to touch and monofilament testing bilaterally: Yes Pulse Check Posterior Tibialis and Dorsalis pulse intact bilaterally: Yes Comments       ASSESSMENT:  Diabetes type 2   See history of present illness for detailed  discussion of current diabetes management, blood sugar patterns and problems identified  Her A1c is 7  Her blood sugars are high in the mornings especially recently although not consistently She may have improved this week with blood sugars around 130-150 but previously as high as 200 and 10 in the morning Likely if her sugars are higher after dinner She will go off her diet occasionally as discussed above Also complaining of increased hunger However likely cannot  afford brand-name medications like Ozempic  Foot exam does not show any abnormality Needs follow-up microalbumin  HYPERTENSION: Blood pressure is controlled, generally followed by PCP  LIPIDS: We will recheck labs today as she has not had any lipid panels since last October  Hypothyroidism: Taking 75 mcg Euthyrox from [facility] and needs follow-up labs today   PLAN:    Discussed improving diet with cutting back on high fat foods and snacks She can start using protein shakes and smoothies but make sure she has added protein such as [place] yogurt and not excessive amounts of fruit She will check readings after dinner consistently to help adjust her Humalog Go up to 12 units of Humalog To call if blood sugars are consistently high Also exercise at least 3 to 4 days a week May consider Rybelsus if she has any weight gain or consistently high postprandial readings, however unlikely she can afford this long-term   There are no Patient Instructions on file for this visit.    Reather Littler 12/27/2019, 1:48 PM   Note: This office note was prepared with Dragon voice recognition system technology. Any transcriptional errors that result from this process are unintentional.

## 2019-12-27 NOTE — Patient Instructions (Addendum)
Take 12 units Lantus   Check blood sugars on waking up days a week  Also check blood sugars about 2 hours after meals and do this after different meals by rotation  Recommended blood sugar levels on waking up are 90-130 and about 2 hours after meal is 130-160  Please bring your blood sugar monitor to each visit, thank you

## 2020-01-05 ENCOUNTER — Other Ambulatory Visit: Payer: Self-pay | Admitting: Endocrinology

## 2020-01-06 ENCOUNTER — Other Ambulatory Visit: Payer: Self-pay

## 2020-01-06 MED ORDER — LEVOTHYROXINE SODIUM 75 MCG PO TABS: ORAL_TABLET | ORAL | 2 refills | Status: DC

## 2020-01-17 ENCOUNTER — Other Ambulatory Visit: Payer: Self-pay | Admitting: Endocrinology

## 2020-02-01 ENCOUNTER — Other Ambulatory Visit: Payer: Self-pay | Admitting: Adult Health

## 2020-02-01 DIAGNOSIS — G47411 Narcolepsy with cataplexy: Secondary | ICD-10-CM

## 2020-02-21 ENCOUNTER — Other Ambulatory Visit: Payer: Self-pay | Admitting: Endocrinology

## 2020-03-01 ENCOUNTER — Other Ambulatory Visit: Payer: Self-pay | Admitting: Endocrinology

## 2020-03-02 ENCOUNTER — Other Ambulatory Visit: Payer: Self-pay

## 2020-03-14 ENCOUNTER — Other Ambulatory Visit: Payer: Self-pay | Admitting: Adult Health

## 2020-03-14 DIAGNOSIS — G47411 Narcolepsy with cataplexy: Secondary | ICD-10-CM

## 2020-03-14 MED ORDER — METHYLPHENIDATE HCL 20 MG PO TABS: 10.0000 mg | ORAL_TABLET | Freq: Three times a day (TID) | ORAL | 0 refills | Status: DC

## 2020-03-14 NOTE — Telephone Encounter (Signed)
Pt is needing a refill on her methylphenidate (RITALIN) 20 MG tablet sent in to the [facility] on [address] 

## 2020-03-14 NOTE — Telephone Encounter (Signed)
[place] [facility] Verified for Ritalin 20 mg  LR:10/10/19 Qty: 135 for 90 days Last OV:12/26/1919 w/ MM/NP Pending appointment: 06/26/2020 w/ MM/NP

## 2020-03-23 ENCOUNTER — Telehealth: Payer: Self-pay | Admitting: Endocrinology

## 2020-03-23 NOTE — Telephone Encounter (Signed)
Pen needles call into the pharmacy.

## 2020-03-23 NOTE — Telephone Encounter (Signed)
Medication Refill Request  Did you call your pharmacy and request this refill first? Yes   If patient has not contacted pharmacy first, instruct them to do so for future refills.   Remind them that contacting the pharmacy for their refill is the quickest method to get the refill.   Refill policy also stated that it will take anywhere between 24-72 hours to receive the refill.    Name of medication? B-D UF III MINI PEN NEEDLES 31G X 5 MM MISC - please call it in for patient to use 3x a day rather than 2x a day  Is this a 90 day supply? Same as last time  Name and location of pharmacy?   [id number] - [place] - [address]  [address] [place] [address]  Phone:  [phone] Fax:  [phone]

## 2020-03-29 ENCOUNTER — Ambulatory Visit: Payer: Medicare Other | Admitting: Endocrinology

## 2020-04-06 ENCOUNTER — Encounter: Payer: Self-pay | Admitting: Endocrinology

## 2020-04-06 ENCOUNTER — Ambulatory Visit: Payer: Medicare Other | Admitting: Endocrinology

## 2020-04-06 ENCOUNTER — Other Ambulatory Visit: Payer: Self-pay

## 2020-04-06 VITALS — BP 128/78 | HR 65 | Ht 61.0 in | Wt 169.0 lb

## 2020-04-06 DIAGNOSIS — E11649 Type 2 diabetes mellitus with hypoglycemia without coma: Secondary | ICD-10-CM

## 2020-04-06 DIAGNOSIS — Z23 Encounter for immunization: Secondary | ICD-10-CM

## 2020-04-06 LAB — POCT GLYCOSYLATED HEMOGLOBIN (HGB A1C): Hemoglobin A1C: 6.7 % — AB (ref 4.0–5.6)

## 2020-04-06 MED ORDER — METFORMIN HCL ER 750 MG PO TB24: 750.0000 mg | ORAL_TABLET | Freq: Two times a day (BID) | ORAL | 3 refills | Status: DC

## 2020-04-06 MED ORDER — GLUCOSE BLOOD VI STRP: ORAL_STRIP | 1 refills | Status: DC

## 2020-04-06 NOTE — Patient Instructions (Signed)
More walking  If sugars stay > 150 in am next week go to 14 Lantus

## 2020-04-06 NOTE — Progress Notes (Signed)
Patient ID: Amanda King, female   DOB: [birth year], 79 y.o.   MRN: [id number]           Reason for Appointment: Follow-up for Type 2 Diabetes  Referring PCP: Dr. Milinda Antis   History of Present Illness:          Date of diagnosis of type 2 diabetes mellitus:  2008      Background history:  She was initially treated with metformin and subsequently glipizide added Januvia was added in 12/2016 She has had variable control with A1c as high as 10.3 done in 2015 and as low as 7.3 done in April 2016 Has generally been followed by her PCP about twice a year  She was tried on [place] in 2020  Recent history:     Non-insulin hypoglycemic drugs:  glipizide ER 10 mg daily, metformin ER 750 mg twice daily  INSULIN doses: Lantus 12 units daily acs, Humalog 4 units at breakfast and supper  Her A1c was previously 7% and now relatively better at 6.7  Current management, blood sugar patterns and problems identified:  She did bring her monitor for download  However has not checked readings more than 4 times in the last 2 weeks but previously had more readings  Overall blood sugars are better than on her last visit  Also has done some readings after dinner which are fairly good  She thinks that if her morning sugars are higher is from eating a fruit at night  She has gained weight and she thinks she can do better with portion control and exercise  Has been consistent with her insulin doses  She does not think she wants to see the dietitian  FASTING readings are better than before with increasing Lantus to 12  Dinner 6 p.m. usually       Side effects from medications have been: Diarrhea from high dose metformin            Glucose monitoring:  done less than 1 times a day         Glucometer:  One Touch       Blood Glucose readings by time of day    PRE-MEAL Fasting Lunch Dinner Bedtime Overall  Glucose range:  113-159      Mean/median:  140     136   POST-MEAL PC Breakfast PC  Lunch PC Dinner  Glucose range:    104-167  Mean/median:    142   Previously:  PRE-MEAL Fasting Lunch  afternoons Bedtime Overall  Glucose range:  138-210  107-222  80, 82    Mean/median:  165    152   POST-MEAL PC Breakfast PC Lunch PC Dinner  Glucose range: ?  ?  Mean/median:          Dietician visit, most recent: 08/12/2018  Weight history:  Wt Readings from Last 3 Encounters:  04/06/20 169 lb (76.7 kg)  12/27/19 164 lb 12.8 oz (74.8 kg)  12/26/19 164 lb 9.6 oz (74.7 kg)    Glycemic control:   Lab Results  Component Value Date   HGBA1C 6.7 (A) 04/06/2020   HGBA1C 7.0 (A) 12/27/2019   HGBA1C 6.7 (A) 08/23/2019   Lab Results  Component Value Date   MICROALBUR <0.7 12/27/2019   LDLCALC 61 12/27/2019   CREATININE 0.97 12/27/2019   Lab Results  Component Value Date   MICRALBCREAT 2.2 12/27/2019    Lab Results  Component Value Date   FRUCTOSAMINE 267 11/22/2018   FRUCTOSAMINE 349 (  H) 08/18/2018    Office Visit on 04/06/2020  Component Date Value Ref Range Status  . Hemoglobin A1C 04/06/2020 6.7* 4.0 - 5.6 % Corrected    Allergies as of 04/06/2020      Reactions   Penicillins Other (See Comments)   Has patient had a PCN reaction causing immediate rash, facial/tongue/throat swelling, SOB or lightheadedness with hypotension: Unknown Has patient had a PCN reaction causing severe rash involving mucus membranes or skin necrosis: Unknown Has patient had a PCN reaction that required hospitalization: Unknown Has patient had a PCN reaction occurring within the last 10 years: No If all of the above answers are "NO", then may proceed with Cephalosporin use.      Medication List       Accurate as of April 06, 2020 11:59 PM. If you have any questions, ask your nurse or doctor.        Align 4 MG Caps Take by mouth daily.   B-D UF III MINI PEN NEEDLES 31G X 5 MM Misc Generic drug: Insulin Pen Needle USE TO INJECT INSULIN TWICE DAILY   cetirizine 10 MG  tablet Commonly known as: ZYRTEC Take 10 mg by mouth daily as needed for allergies or rhinitis.   glipiZIDE 10 MG 24 hr tablet Commonly known as: GLUCOTROL XL Take 1 tablet by mouth once daily with breakfast   glucose blood test strip Use onetouch verio test strip as instructed to check blood sugar once daily.   hydrochlorothiazide 25 MG tablet Commonly known as: HYDRODIURIL Take 0.5 tablets (12.5 mg total) by mouth daily.   imipramine 50 MG tablet Commonly known as: TOFRANIL TAKE 1 TABLET BY MOUTH AT BEDTIME.   insulin lispro 100 UNIT/ML KwikPen Commonly known as: HumaLOG KwikPen 4 units twice daily before meals What changed:   how much to take  how to take this  when to take this  additional instructions   Lantus SoloStar 100 UNIT/ML Solostar Pen Generic drug: insulin glargine START WITH 12 UNITS ONCE A DAY AND INCREASE AS DIRECTED   levothyroxine 75 MCG tablet Commonly known as: Euthyrox TAKE 1 TABLET BY MOUTH ONCE DAILY BEFORE BREAKFAST   lisinopril 20 MG tablet Commonly known as: ZESTRIL Take 1 tablet (20 mg total) by mouth daily.   metFORMIN 750 MG 24 hr tablet Commonly known as: GLUCOPHAGE-XR Take 1 tablet (750 mg total) by mouth 2 (two) times daily.   methylphenidate 20 MG tablet Commonly known as: RITALIN Take 0.5 tablets (10 mg total) by mouth 3 (three) times daily with meals.   OneTouch Delica Lancets 30G Misc 1 each by Does not apply route See admin instructions. Use onetouch delica lancet to check blood sugar once daily.   polyethylene glycol 17 g packet Commonly known as: MIRALAX / GLYCOLAX Take 17 g by mouth daily as needed.   simvastatin 80 MG tablet Commonly known as: ZOCOR Take 1 tablet (80 mg total) by mouth at bedtime.       Allergies:  Allergies  Allergen Reactions  . Penicillins Other (See Comments)    Has patient had a PCN reaction causing immediate rash, facial/tongue/throat swelling, SOB or lightheadedness with  hypotension: Unknown Has patient had a PCN reaction causing severe rash involving mucus membranes or skin necrosis: Unknown Has patient had a PCN reaction that required hospitalization: Unknown Has patient had a PCN reaction occurring within the last 10 years: No If all of the above answers are "NO", then may proceed with Cephalosporin use.  Past Medical History:  Diagnosis Date  . Diabetes mellitus    Type II  . Hyperlipidemia   . Hypertension   . Hypothyroidism   . Migraine   . Narcolepsy with cataplexy   . Pneumonia 08/2001    Past Surgical History:  Procedure Laterality Date  . APPENDECTOMY  age 89  . BIOPSY  05/24/2018   Procedure: BIOPSY;  Surgeon: Meridee ScoreMansouraty, Netty StarringGabriel Jr., MD;  [facility];  Service: Gastroenterology;;  . COLONOSCOPY N/A 05/24/2018   Procedure: COLONOSCOPY;  Surgeon: Lemar LoftyMansouraty, Gabriel Jr., MD;  [facility];  Service: Gastroenterology;  Laterality: N/A;  . hemilaminectomy     C5-6; C6-7  . PARTIAL HYSTERECTOMY  age 79  . POSTERIOR CERVICAL FUSION/FORAMINOTOMY  02/05/1975   C5-C6-C7    Family History  Problem Relation Age of Onset  . Depression Brother        suicide  . Diabetes Brother   . Heart disease Mother        MI  . Peripheral vascular disease Mother   . Hyperlipidemia Mother   . Diabetes Brother   . Sleep apnea Son   . Diabetes Son   . Sleep apnea Brother     Social History:  reports that she quit smoking about 39 years ago. She has never used smokeless tobacco. She reports that she does not drink alcohol and does not use drugs.   Review of Systems   Lipid history: Has been treated by her PCP with simvastatin 80 mg Has had high triglycerides   Lab Results  Component Value Date   CHOL 144 12/27/2019   HDL 44.60 12/27/2019   LDLCALC 61 12/27/2019   LDLDIRECT 94.0 03/30/2019   TRIG 194.0 (H) 12/27/2019   CHOLHDL 3 12/27/2019           Hypertension: Has been treated with HCTZ, 12.5 mg lisinopril 20  mg by her PCP   BP Readings from Last 3 Encounters:  04/06/20 128/78  12/27/19 140/80  12/26/19 (!) 150/70    Most recent eye exam was in 8/20  Most recent foot exam: 7/21   Hypothyroidism: Taking 75 mcg of levothyroxine and last TSH was normal  Lab Results  Component Value Date   TSH 1.04 12/27/2019   TSH 2.57 08/23/2019   TSH 0.94 03/30/2019   FREET4 0.95 12/27/2019   FREET4 0.91 08/23/2019   FREET4 1.00 02/22/2019      LABS:  Office Visit on 04/06/2020  Component Date Value Ref Range Status  . Hemoglobin A1C 04/06/2020 6.7* 4.0 - 5.6 % Corrected    Physical Examination:  BP 128/78   Pulse 65   Ht 5\' 1"  (1.549 m)   Wt 169 lb (76.7 kg)   SpO2 94%   BMI 31.93 kg/m      ASSESSMENT:  Diabetes type 2   See history of present illness for detailed discussion of current diabetes management, blood sugar patterns and problems identified  Her A1c is 6.7  Blood sugars are improved compared to last visit Likely she has done little better with her diet although still can do better because of her weight gain As before she cannot afford brand-name medications and is on basal bolus insulin, Metformin and glipizide  With increasing her insulin doses slightly blood sugars are also relatively better although recently not checking enough Fasting glucose appears to be higher now   HYPERTENSION: Blood pressure is controlled, followed by PCP   PLAN:    She says she can try and  cut back on portions Also she can start walking on a treadmill at her daughter's house If her morning sugars still over 150 she can go up to 14 units of the Lantus Otherwise no change in insulin More regular glucose monitoring by rotation at different times   Patient Instructions  More walking  If sugars stay > 150 in am next week go to 14 Lantus  Flu vaccine given   Reather Littler 04/08/2020, 3:24 PM   Note: This office note was prepared with Dragon voice recognition system  technology. Any transcriptional errors that result from this process are unintentional.

## 2020-04-08 ENCOUNTER — Encounter: Payer: Self-pay | Admitting: Endocrinology

## 2020-04-12 ENCOUNTER — Other Ambulatory Visit: Payer: Self-pay | Admitting: Family Medicine

## 2020-05-01 ENCOUNTER — Other Ambulatory Visit: Payer: Self-pay | Admitting: Family Medicine

## 2020-05-01 ENCOUNTER — Other Ambulatory Visit: Payer: Self-pay | Admitting: Endocrinology

## 2020-05-02 NOTE — Telephone Encounter (Signed)
Med refilled once and Lyla Son will reach out pt to pt try and get appt scheduled

## 2020-05-02 NOTE — Telephone Encounter (Signed)
Please schedule PE or f/u and refill until then 

## 2020-05-02 NOTE — Telephone Encounter (Signed)
Pt hasn't been seen in over a year and no future appts., please advise  

## 2020-05-02 NOTE — Telephone Encounter (Signed)
I left a detailed message on patient's voice mail to call back and schedule cpx or follow up appointment with Dr.Tower.

## 2020-05-16 ENCOUNTER — Telehealth: Payer: Self-pay | Admitting: Family Medicine

## 2020-05-16 DIAGNOSIS — E1169 Type 2 diabetes mellitus with other specified complication: Secondary | ICD-10-CM

## 2020-05-16 DIAGNOSIS — E785 Hyperlipidemia, unspecified: Secondary | ICD-10-CM

## 2020-05-16 DIAGNOSIS — I10 Essential (primary) hypertension: Secondary | ICD-10-CM

## 2020-05-16 DIAGNOSIS — E11649 Type 2 diabetes mellitus with hypoglycemia without coma: Secondary | ICD-10-CM

## 2020-05-16 DIAGNOSIS — E039 Hypothyroidism, unspecified: Secondary | ICD-10-CM

## 2020-05-16 NOTE — Telephone Encounter (Signed)
-----   Message from Aquilla Solian, RT sent at 05/11/2020 11:16 AM EST ----- Regarding: Lab Orders for Thursday 12.9.2021 Please place lab orders for Thursday 12.9.2021, office visit for physical on Thursday 12.16.2021 Thank you, Jones Bales RT(R)

## 2020-05-17 ENCOUNTER — Other Ambulatory Visit (INDEPENDENT_AMBULATORY_CARE_PROVIDER_SITE_OTHER): Payer: Medicare Other

## 2020-05-17 ENCOUNTER — Other Ambulatory Visit: Payer: Self-pay

## 2020-05-17 DIAGNOSIS — I10 Essential (primary) hypertension: Secondary | ICD-10-CM

## 2020-05-17 DIAGNOSIS — E1169 Type 2 diabetes mellitus with other specified complication: Secondary | ICD-10-CM | POA: Diagnosis not present

## 2020-05-17 DIAGNOSIS — E039 Hypothyroidism, unspecified: Secondary | ICD-10-CM | POA: Diagnosis not present

## 2020-05-17 DIAGNOSIS — E785 Hyperlipidemia, unspecified: Secondary | ICD-10-CM

## 2020-05-17 LAB — CBC WITH DIFFERENTIAL/PLATELET
Basophils Absolute: 0 10*3/uL (ref 0.0–0.1)
Basophils Relative: 0.4 % (ref 0.0–3.0)
Eosinophils Absolute: 0.2 10*3/uL (ref 0.0–0.7)
Eosinophils Relative: 2.2 % (ref 0.0–5.0)
HCT: 40.1 % (ref 36.0–46.0)
Hemoglobin: 13.3 g/dL (ref 12.0–15.0)
Lymphocytes Relative: 29 % (ref 12.0–46.0)
Lymphs Abs: 2.5 10*3/uL (ref 0.7–4.0)
MCHC: 33.1 g/dL (ref 30.0–36.0)
MCV: 89.6 fl (ref 78.0–100.0)
Monocytes Absolute: 0.6 10*3/uL (ref 0.1–1.0)
Monocytes Relative: 6.8 % (ref 3.0–12.0)
Neutro Abs: 5.2 10*3/uL (ref 1.4–7.7)
Neutrophils Relative %: 61.6 % (ref 43.0–77.0)
Platelets: 272 10*3/uL (ref 150.0–400.0)
RBC: 4.47 Mil/uL (ref 3.87–5.11)
RDW: 13.5 % (ref 11.5–15.5)
WBC: 8.5 10*3/uL (ref 4.0–10.5)

## 2020-05-17 LAB — COMPREHENSIVE METABOLIC PANEL
ALT: 25 U/L (ref 0–35)
AST: 20 U/L (ref 0–37)
Albumin: 4.6 g/dL (ref 3.5–5.2)
Alkaline Phosphatase: 89 U/L (ref 39–117)
BUN: 23 mg/dL (ref 6–23)
CO2: 31 mEq/L (ref 19–32)
Calcium: 8.9 mg/dL (ref 8.4–10.5)
Chloride: 99 mEq/L (ref 96–112)
Creatinine, Ser: 0.97 mg/dL (ref 0.40–1.20)
GFR: 55.18 mL/min — ABNORMAL LOW (ref >60.00)
Glucose, Bld: 95 mg/dL (ref 70–99)
Potassium: 4 mEq/L (ref 3.5–5.1)
Sodium: 138 mEq/L (ref 135–145)
Total Bilirubin: 0.4 mg/dL (ref 0.2–1.2)
Total Protein: 7.2 g/dL (ref 6.0–8.3)

## 2020-05-17 LAB — LDL CHOLESTEROL, DIRECT: Direct LDL: 87 mg/dL

## 2020-05-17 LAB — LIPID PANEL
Cholesterol: 174 mg/dL (ref 0–200)
HDL: 45.3 mg/dL (ref >39.00)
NonHDL: 129.16
Total CHOL/HDL Ratio: 4
Triglycerides: 290 mg/dL — ABNORMAL HIGH (ref 0.0–149.0)
VLDL: 58 mg/dL — ABNORMAL HIGH (ref 0.0–40.0)

## 2020-05-17 LAB — TSH: TSH: 4.82 u[IU]/mL — ABNORMAL HIGH (ref 0.35–4.50)

## 2020-05-18 ENCOUNTER — Other Ambulatory Visit: Payer: Self-pay | Admitting: Family Medicine

## 2020-05-18 ENCOUNTER — Other Ambulatory Visit: Payer: Self-pay | Admitting: Endocrinology

## 2020-05-24 ENCOUNTER — Other Ambulatory Visit: Payer: Self-pay

## 2020-05-24 ENCOUNTER — Encounter: Payer: Self-pay | Admitting: Family Medicine

## 2020-05-24 ENCOUNTER — Ambulatory Visit (INDEPENDENT_AMBULATORY_CARE_PROVIDER_SITE_OTHER): Payer: Medicare Other | Admitting: Family Medicine

## 2020-05-24 VITALS — BP 140/80 | HR 86 | Temp 96.9°F | Ht 61.25 in | Wt 168.2 lb

## 2020-05-24 DIAGNOSIS — E11649 Type 2 diabetes mellitus with hypoglycemia without coma: Secondary | ICD-10-CM | POA: Diagnosis not present

## 2020-05-24 DIAGNOSIS — I10 Essential (primary) hypertension: Secondary | ICD-10-CM

## 2020-05-24 DIAGNOSIS — Z0000 Encounter for general adult medical examination without abnormal findings: Secondary | ICD-10-CM | POA: Diagnosis not present

## 2020-05-24 DIAGNOSIS — E039 Hypothyroidism, unspecified: Secondary | ICD-10-CM | POA: Diagnosis not present

## 2020-05-24 DIAGNOSIS — E669 Obesity, unspecified: Secondary | ICD-10-CM

## 2020-05-24 DIAGNOSIS — E785 Hyperlipidemia, unspecified: Secondary | ICD-10-CM

## 2020-05-24 DIAGNOSIS — E1169 Type 2 diabetes mellitus with other specified complication: Secondary | ICD-10-CM

## 2020-05-24 MED ORDER — HYDROCHLOROTHIAZIDE 25 MG PO TABS
ORAL_TABLET | ORAL | 3 refills | Status: DC
Start: 2020-05-24 — End: 2021-05-22

## 2020-05-24 MED ORDER — SIMVASTATIN 80 MG PO TABS
80.0000 mg | ORAL_TABLET | Freq: Every day | ORAL | 3 refills | Status: DC
Start: 2020-05-24 — End: 2021-08-29

## 2020-05-24 MED ORDER — LISINOPRIL 20 MG PO TABS
20.0000 mg | ORAL_TABLET | Freq: Every day | ORAL | 3 refills | Status: DC
Start: 2020-05-24 — End: 2021-09-17

## 2020-05-24 NOTE — Assessment & Plan Note (Signed)
Reviewed health habits including diet and exercise and skin cancer prevention Reviewed appropriate screening tests for age  Also reviewed health mt list, fam hx and immunization status , as well as social and family history   See HPI Labs reviewed  Pt decided to get a mammogram- supplied with info to call and schedule  Declines shigrix and dexa  No falls or fx  covid immunized, pending booster  Commended on exercise with bike  Given materials to work on adv directive No cognitive concerns (very sharp) Very hard of hearing-forgot hearing aides today, enc her to use full time for safety  utd vision and eye exam and sent for last report   

## 2020-05-24 NOTE — Assessment & Plan Note (Signed)
Recent med switch with endocrinology Lab Results  Component Value Date   TSH 4.82 (H) 05/17/2020   She plans to f/u with Dr Lucianne Muss No changes clinically

## 2020-05-24 NOTE — Assessment & Plan Note (Signed)
Reviewed health habits including diet and exercise and skin cancer prevention Reviewed appropriate screening tests for age  Also reviewed health mt list, fam hx and immunization status , as well as social and family history   See HPI Labs reviewed  Pt decided to get a mammogram- supplied with info to call and schedule  Declines shigrix and dexa  No falls or fx  covid immunized, pending booster  Commended on exercise with bike  Given materials to work on [employer] directive No cognitive concerns (very sharp) Very hard of hearing-forgot hearing aides today, enc her to use full time for safety  utd vision and eye exam and sent for last report

## 2020-05-24 NOTE — Assessment & Plan Note (Signed)
Discussed how this problem influences overall health and the risks it imposes  Reviewed plan for weight loss with lower calorie diet (via better food choices and also portion control or program like weight watchers) and exercise building up to or more than 30 minutes 5 days per week including some aerobic activity    

## 2020-05-24 NOTE — Assessment & Plan Note (Signed)
Disc goals for lipids and reasons to control them Rev last labs with pt Rev low sat fat diet in detail  LDL controlled at 61 with simvastatin

## 2020-05-24 NOTE — Assessment & Plan Note (Signed)
bp in fair control at this time  BP Readings from Last 1 Encounters:  05/24/20 140/80   No changes needed Most recent labs reviewed  Disc lifstyle change with low sodium diet and exercise  Plans to continue lisinopril 20 mg daily and hctz 25 mg daily

## 2020-05-24 NOTE — Assessment & Plan Note (Signed)
Sees endocrinology and last a1c was improved Working on diet  Taking ace and statin  Nl foot exam  Sent for eye exam report  Taking glipizide and lantus and humalog and metformin

## 2020-05-24 NOTE — Patient Instructions (Addendum)
Keep using your bike and keep working on diet   Take care of yourself  Wear your hearing aides   Try to get 1200-1500 mg of calcium per day with at least 1000 iu of vitamin D - for bone health  Don't forget to schedule your mammogram at [facility]  Take a look at the blue booklet to make an advance directive  Fill it out and get it notarized and then you are done   Don't forget your annual eye exam for diabetes  Stop at check out and we will send for the last one   Check in with Dr Lucianne Muss about thyroid   Keep taking care of yourself

## 2020-05-24 NOTE — Progress Notes (Signed)
Subjective:    Patient ID: Amanda SparrowBrenda C Zwart, female    DOB: 11-16-1940, 79 y.o.   MRN: [id number]  This visit occurred during the SARS-CoV-2 public health emergency.  Safety protocols were in place, including screening questions prior to the visit, additional usage of staff PPE, and extensive cleaning of exam room while observing appropriate contact time as indicated for disinfecting solutions.    HPI Pt presents for health mt exam and amw  I have personally reviewed the Medicare Annual Wellness questionnaire and have noted 1. The patient's medical and social history 2. Their use of alcohol, tobacco or illicit drugs 3. Their current medications and supplements 4. The patient's functional ability including ADL's, fall risks, home safety risks and hearing or visual             impairment. 5. Diet and physical activities 6. Evidence for depression or mood disorders  The patients weight, height, BMI have been recorded in the chart and visual acuity is per eye clinic.  I have made referrals, counseling and provided education to the patient based review of the above and I have provided the pt with a written personalized care plan for preventive services. Reviewed and updated provider list, see scanned forms.  See scanned forms.  Routine anticipatory guidance given to patient.  See health maintenance. Colon cancer screening  Colonoscopy 2019 Breast cancer screening-declines mammogram in past - wants to get one now  Self breast exam-no lumps  Flu vaccine 10/21 Tetanus vaccine 1/13 Tdap Pneumovax completed Zoster vaccine-declines  covid status -immunized pfizer- getting booster this week  Dexa 4/15 -declines another  Falls-none  (balance is pretty good for her age)  Cindee LameFractures-none Supplements-none Exercise - bike   Advance directive-does not have a living will , given materials Cognitive function addressed- see scanned forms- and if abnormal then additional documentation follows.   Memory is pretty good Just forgets names   PMH and SH reviewed  Meds, vitals, and allergies reviewed.   ROS: See HPI.  Otherwise negative.    Weight : Wt Readings from Last 3 Encounters:  05/24/20 168 lb 4 oz (76.3 kg)  04/06/20 169 lb (76.7 kg)  12/27/19 164 lb 12.8 oz (74.8 kg)   31.53 kg/m Gained weight   Hearing/vision:  Hearing Screening   125Hz  250Hz  500Hz  1000Hz  2000Hz  3000Hz  4000Hz  6000Hz  8000Hz   Right ear:           Left ear:           Comments: Pt wears hearing aids  Vision Screening Comments: [facility]    Care team Sai Zinn- pcp Dohmeier-neuro  Doing well  Learned a lot about DM   Takes ritalin for narcolepsy   HTN bp is stable today  No cp or palpitations or headaches or edema  No side effects to medicines  BP Readings from Last 3 Encounters:  05/24/20 140/80  04/06/20 128/78  12/27/19 140/80    Pulse Readings from Last 3 Encounters:  05/24/20 86  04/06/20 65  12/27/19 96   Takes lisinopril 20 mg daily  hctz 25 mg daily   Hypothyroidism  Pt has no clinical changes No change in energy level/ hair or skin/ edema and no tremor Lab Results  Component Value Date   TSH 4.82 (H) 05/17/2020    Sees endocrinology and takes euthyrox 75 mcg daily   DM2 Sees Dr Lucianne MussKumar On ace Eye exam  a1c last 6.7  Gained wt - has  to eat less at night /started to cut back  Rides exercise bike -needs to use it more  Hyperlipidemia Lab Results  Component Value Date   CHOL 174 05/17/2020   CHOL 144 12/27/2019   CHOL 186 03/30/2019   Lab Results  Component Value Date   HDL 45.30 05/17/2020   HDL 44.60 12/27/2019   HDL 44.30 03/30/2019   Lab Results  Component Value Date   LDLCALC 61 12/27/2019   LDLCALC 73 06/21/2014   LDLCALC 49 09/14/2013   Lab Results  Component Value Date   TRIG 290.0 (H) 05/17/2020   TRIG 194.0 (H) 12/27/2019   TRIG 229.0 (H) 03/30/2019   Lab Results  Component Value Date    CHOLHDL 4 05/17/2020   CHOLHDL 3 12/27/2019   CHOLHDL 4 03/30/2019   Lab Results  Component Value Date   LDLDIRECT 87.0 05/17/2020   LDLDIRECT 94.0 03/30/2019   LDLDIRECT 86.0 06/24/2018   On max dose of simvastatin   Lab Results  Component Value Date   WBC 8.5 05/17/2020   HGB 13.3 05/17/2020   HCT 40.1 05/17/2020   MCV 89.6 05/17/2020   PLT 272.0 05/17/2020   Lab Results  Component Value Date   CREATININE 0.97 05/17/2020   BUN 23 05/17/2020   NA 138 05/17/2020   K 4.0 05/17/2020   CL 99 05/17/2020   CO2 31 05/17/2020    Patient Active Problem List   Diagnosis Date Noted  . History of ischemic colitis   . Constipation   . Routine general medical examination at a health care facility 01/31/2016  . Narcolepsy with cataplexy 10/31/2014  . Stress reaction 03/28/2014  . Encounter for Medicare annual wellness exam 09/22/2013  . Hypersomnia, persistent 07/15/2013  . Colon cancer screening 12/31/2011  . Obesity (BMI 30-39.9) 07/01/2011  . ANXIETY 05/06/2010  . DERMATOPHYTOSIS OF NAIL 12/21/2008  . Hypothyroidism 09/02/2006  . Diabetes type 2, uncontrolled (HCC) 09/02/2006  . Hyperlipidemia associated with type 2 diabetes mellitus (HCC) 09/02/2006  . NARCOLEPSY W/CATAPLEXY 09/02/2006  . Essential hypertension 09/02/2006  . PNEUMONIA, HX OF 09/02/2006   Past Medical History:  Diagnosis Date  . Diabetes mellitus    Type II  . Hyperlipidemia   . Hypertension   . Hypothyroidism   . Migraine   . Narcolepsy with cataplexy   . Pneumonia 08/2001   Past Surgical History:  Procedure Laterality Date  . APPENDECTOMY  age 1  . BIOPSY  05/24/2018   Procedure: BIOPSY;  Surgeon: Meridee Score Netty Starring., MD;  Location: [facility] ENDOSCOPY;  Service: Gastroenterology;;  . COLONOSCOPY N/A 05/24/2018   Procedure: COLONOSCOPY;  Surgeon: Lemar Lofty., MD;  Location: [facility] ENDOSCOPY;  Service: Gastroenterology;  Laterality: N/A;  . hemilaminectomy     C5-6; C6-7  . PARTIAL  HYSTERECTOMY  age 58  . POSTERIOR CERVICAL FUSION/FORAMINOTOMY  02/05/1975   C5-C6-C7   Social History   Tobacco Use  . Smoking status: Former Smoker    Quit date: 06/09/1980    Years since quitting: 39.9  . Smokeless tobacco: Never Used  Vaping Use  . Vaping Use: Never used  Substance Use Topics  . Alcohol use: No    Alcohol/week: 0.0 standard drinks  . Drug use: No   Family History  Problem Relation Age of Onset  . Depression Brother        suicide  . Diabetes Brother   . Heart disease Mother        MI  . Peripheral vascular disease Mother   .  Hyperlipidemia Mother   . Diabetes Brother   . Sleep apnea Son   . Diabetes Son   . Sleep apnea Brother    Allergies  Allergen Reactions  . Penicillins Other (See Comments)    Has patient had a PCN reaction causing immediate rash, facial/tongue/throat swelling, SOB or lightheadedness with hypotension: Unknown Has patient had a PCN reaction causing severe rash involving mucus membranes or skin necrosis: Unknown Has patient had a PCN reaction that required hospitalization: Unknown Has patient had a PCN reaction occurring within the last 10 years: No If all of the above answers are "NO", then may proceed with Cephalosporin use.    Current Outpatient Medications on File Prior to Visit  Medication Sig Dispense Refill  . B-D UF III MINI PEN NEEDLES 31G X 5 MM MISC USE TO INJECT INSULIN TWICE DAILY 100 each 0  . cetirizine (ZYRTEC) 10 MG tablet Take 10 mg by mouth daily as needed for allergies or rhinitis.     Janann August 75 MCG tablet TAKE 1 TABLET BY MOUTH ONCE DAILY BEFORE BREAKFAST 30 tablet 5  . glipiZIDE (GLUCOTROL XL) 10 MG 24 hr tablet Take 1 tablet by mouth once daily with breakfast 90 tablet 0  . glucose blood test strip Use onetouch verio test strip as instructed to check blood sugar once daily. 100 each 1  . imipramine (TOFRANIL) 50 MG tablet TAKE 1 TABLET BY MOUTH AT BEDTIME. 30 tablet 5  . insulin glargine (LANTUS  SOLOSTAR) 100 UNIT/ML Solostar Pen START WITH 12 UNITS ONCE A DAY AND INCREASE AS DIRECTED 15 mL 0  . insulin lispro (HUMALOG KWIKPEN) 100 UNIT/ML KwikPen 4 units twice daily before meals (Patient taking differently: Inject 3 Units into the skin daily. 3 units once daily before supper.) 15 mL 1  . metFORMIN (GLUCOPHAGE-XR) 750 MG 24 hr tablet Take 1 tablet (750 mg total) by mouth 2 (two) times daily. 180 tablet 3  . methylphenidate (RITALIN) 20 MG tablet Take 0.5 tablets (10 mg total) by mouth 3 (three) times daily with meals. 135 tablet 0  . OneTouch Delica Lancets 30G MISC 1 each by Does not apply route See admin instructions. Use onetouch delica lancet to check blood sugar once daily. 50 each 1  . polyethylene glycol (MIRALAX / GLYCOLAX) packet Take 17 g by mouth daily as needed. 14 each 0  . Probiotic Product (ALIGN) 4 MG CAPS Take by mouth daily.     No current facility-administered medications on file prior to visit.    Review of Systems  Constitutional: Negative for activity change, appetite change, fatigue, fever and unexpected weight change.  HENT: Negative for congestion, ear pain, rhinorrhea, sinus pressure and sore throat.   Eyes: Negative for pain, redness and visual disturbance.  Respiratory: Negative for cough, shortness of breath and wheezing.   Cardiovascular: Negative for chest pain and palpitations.  Gastrointestinal: Negative for abdominal pain, blood in stool, constipation and diarrhea.  Endocrine: Negative for polydipsia and polyuria.  Genitourinary: Negative for dysuria, frequency and urgency.  Musculoskeletal: Positive for arthralgias. Negative for back pain and myalgias.  Skin: Negative for pallor and rash.  Allergic/Immunologic: Negative for environmental allergies.  Neurological: Negative for dizziness, syncope and headaches.  Hematological: Negative for adenopathy. Does not bruise/bleed easily.  Psychiatric/Behavioral: Negative for decreased concentration and  dysphoric mood. The patient is not nervous/anxious.        Objective:   Physical Exam Constitutional:      General: She is not in acute distress.  Appearance: Normal appearance. She is well-developed. She is obese. She is not ill-appearing or diaphoretic.  HENT:     Head: Normocephalic and atraumatic.     Right Ear: Tympanic membrane, ear canal and external ear normal.     Left Ear: Tympanic membrane, ear canal and external ear normal.     Ears:     Comments: Very HOH without hearing aides today    Nose: Nose normal. No congestion.     Mouth/Throat:     Mouth: Mucous membranes are moist.     Pharynx: Oropharynx is clear. No posterior oropharyngeal erythema.  Eyes:     General: No scleral icterus.    Extraocular Movements: Extraocular movements intact.     Conjunctiva/sclera: Conjunctivae normal.     Pupils: Pupils are equal, round, and reactive to light.  Neck:     Thyroid: No thyromegaly.     Vascular: No carotid bruit or JVD.  Cardiovascular:     Rate and Rhythm: Normal rate and regular rhythm.     Pulses: Normal pulses.     Heart sounds: Normal heart sounds. No gallop.   Pulmonary:     Effort: Pulmonary effort is normal. No respiratory distress.     Breath sounds: Normal breath sounds. No wheezing.     Comments: Good air exch Chest:     Chest wall: No tenderness.  Abdominal:     General: Bowel sounds are normal. There is no distension or abdominal bruit.     Palpations: Abdomen is soft. There is no mass.     Tenderness: There is no abdominal tenderness.     Hernia: No hernia is present.  Genitourinary:    Comments: Breast exam: No mass, nodules, thickening, tenderness, bulging, retraction, inflamation, nipple discharge or skin changes noted.  No axillary or clavicular LA.     Musculoskeletal:        General: No tenderness. Normal range of motion.     Cervical back: Normal range of motion and neck supple. No rigidity. No muscular tenderness.     Right lower leg: No  edema.     Left lower leg: No edema.     Comments: No kyphosis   Lymphadenopathy:     Cervical: No cervical adenopathy.  Skin:    General: Skin is warm and dry.     Coloration: Skin is not pale.     Findings: No erythema or rash.     Comments: Solar lentigines diffusely Some sk on back  Neurological:     Mental Status: She is alert. Mental status is at baseline.     Cranial Nerves: No cranial nerve deficit.     Motor: No abnormal muscle tone.     Coordination: Coordination normal.     Gait: Gait normal.     Deep Tendon Reflexes: Reflexes are normal and symmetric. Reflexes normal.  Psychiatric:        Mood and Affect: Mood normal.        Cognition and Memory: Cognition and memory normal.           Assessment & Plan:   Problem List Items Addressed This Visit      Cardiovascular and Mediastinum   Essential hypertension    bp in fair control at this time  BP Readings from Last 1 Encounters:  05/24/20 140/80   No changes needed Most recent labs reviewed  Disc lifstyle change with low sodium diet and exercise  Plans to continue lisinopril 20 mg daily and hctz  25 mg daily       Relevant Medications   hydrochlorothiazide (HYDRODIURIL) 25 MG tablet   lisinopril (ZESTRIL) 20 MG tablet   simvastatin (ZOCOR) 80 MG tablet     Endocrine   Hypothyroidism    Recent med switch with endocrinology Lab Results  Component Value Date   TSH 4.82 (H) 05/17/2020   She plans to f/u with Dr Lucianne Muss No changes clinically      Diabetes type 2, uncontrolled (HCC)    Sees endocrinology and last a1c was improved Working on diet  Taking ace and statin  Nl foot exam  Sent for eye exam report  Taking glipizide and lantus and humalog and metformin        Relevant Medications   lisinopril (ZESTRIL) 20 MG tablet   simvastatin (ZOCOR) 80 MG tablet   Hyperlipidemia associated with type 2 diabetes mellitus (HCC)    Disc goals for lipids and reasons to control them Rev last labs with  pt Rev low sat fat diet in detail  LDL controlled at 61 with simvastatin       Relevant Medications   lisinopril (ZESTRIL) 20 MG tablet   simvastatin (ZOCOR) 80 MG tablet     Other   Obesity (BMI 30-39.9)    Discussed how this problem influences overall health and the risks it imposes  Reviewed plan for weight loss with lower calorie diet (via better food choices and also portion control or program like weight watchers) and exercise building up to or more than 30 minutes 5 days per week including some aerobic activity         Encounter for Medicare annual wellness exam - Primary    Reviewed health habits including diet and exercise and skin cancer prevention Reviewed appropriate screening tests for age  Also reviewed health mt list, fam hx and immunization status , as well as social and family history   See HPI Labs reviewed  Pt decided to get a mammogram- supplied with info to call and schedule  Declines shigrix and dexa  No falls or fx  covid immunized, pending booster  Commended on exercise with bike  Given materials to work on [employer] directive No cognitive concerns (very sharp) Very hard of hearing-forgot hearing aides today, enc her to use full time for safety  utd vision and eye exam and sent for last report      Routine general medical examination at a health care facility    Reviewed health habits including diet and exercise and skin cancer prevention Reviewed appropriate screening tests for age  Also reviewed health mt list, fam hx and immunization status , as well as social and family history   See HPI Labs reviewed  Pt decided to get a mammogram- supplied with info to call and schedule  Declines shigrix and dexa  No falls or fx  covid immunized, pending booster  Commended on exercise with bike  Given materials to work on [employer] directive No cognitive concerns (very sharp) Very hard of hearing-forgot hearing aides today, enc her to use full time for safety  utd  vision and eye exam and sent for last report

## 2020-05-30 ENCOUNTER — Other Ambulatory Visit: Payer: Self-pay | Admitting: Endocrinology

## 2020-06-26 ENCOUNTER — Ambulatory Visit: Payer: Medicare Other | Admitting: Adult Health

## 2020-07-09 ENCOUNTER — Ambulatory Visit: Payer: Medicare Other | Admitting: Endocrinology

## 2020-07-24 ENCOUNTER — Other Ambulatory Visit: Payer: Self-pay | Admitting: Adult Health

## 2020-07-24 DIAGNOSIS — G47411 Narcolepsy with cataplexy: Secondary | ICD-10-CM

## 2020-08-01 ENCOUNTER — Other Ambulatory Visit: Payer: Self-pay | Admitting: Endocrinology

## 2020-08-03 ENCOUNTER — Other Ambulatory Visit: Payer: Self-pay

## 2020-08-07 ENCOUNTER — Ambulatory Visit: Payer: Medicare Other | Admitting: Endocrinology

## 2020-09-03 ENCOUNTER — Other Ambulatory Visit: Payer: Self-pay | Admitting: Endocrinology

## 2020-09-14 ENCOUNTER — Other Ambulatory Visit: Payer: Self-pay | Admitting: Endocrinology

## 2020-09-21 IMAGING — CT CT ABD-PELV W/O CM
2 of 4 series · 15 of 46 positions shown, 17 images · non-contrast
Comparison: Abdominal radiograph dated 05/17/2018

CLINICAL DATA: 77-year-old female with abdominal distention.
Concern for bowel obstruction.

EXAM:
CT ABDOMEN AND PELVIS WITHOUT CONTRAST
TECHNIQUE: Multidetector CT imaging of the abdomen and pelvis was performed
following the standard protocol without IV contrast.

[Series 3: abd/ pelvis 5.0 i30f 2 · axial · 0.84mm/px · z∈[+719,+1169]mm · 12 of 100 slices shown, 14 images]
[im 5/100  soft-tissue]
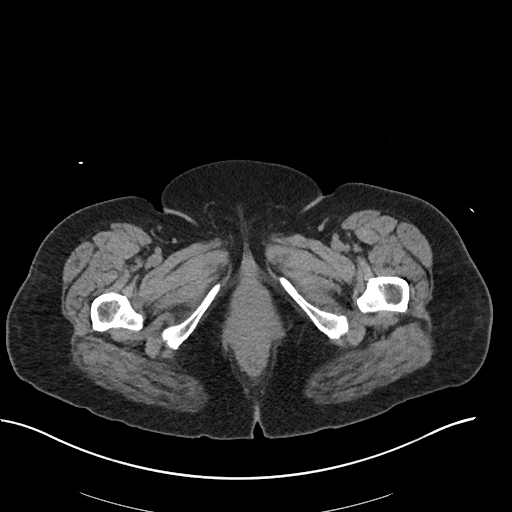
[im 5/100  bone]
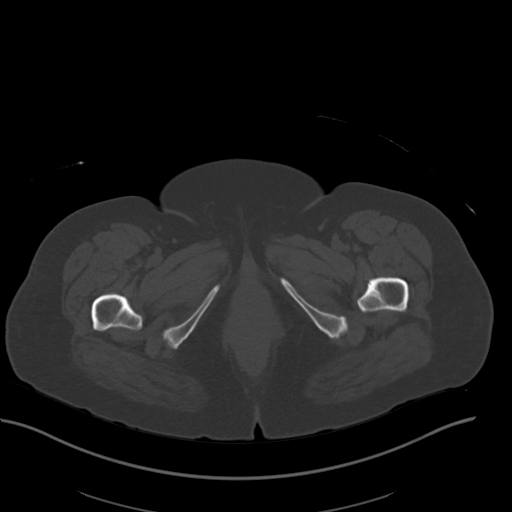
[im 15/100  soft-tissue]
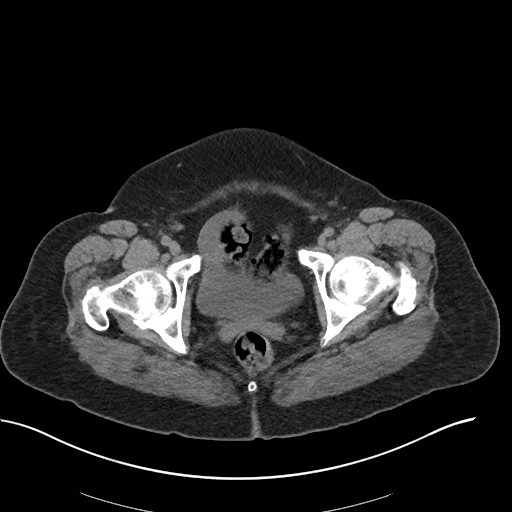
[im 24/100  soft-tissue]
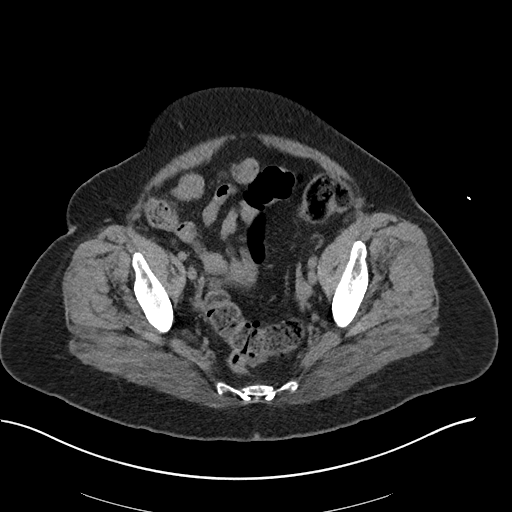
[im 29/100  soft-tissue]
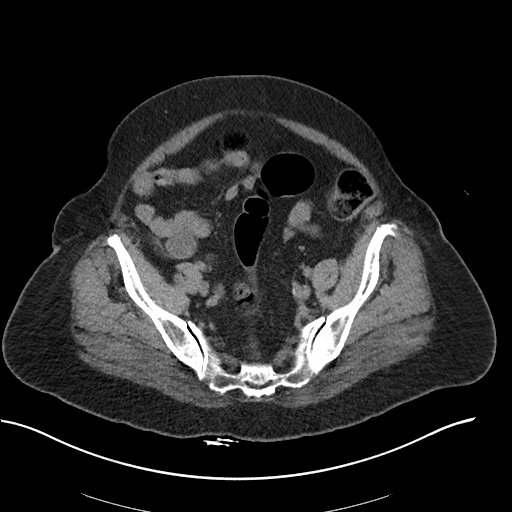
[im 38/100  soft-tissue]
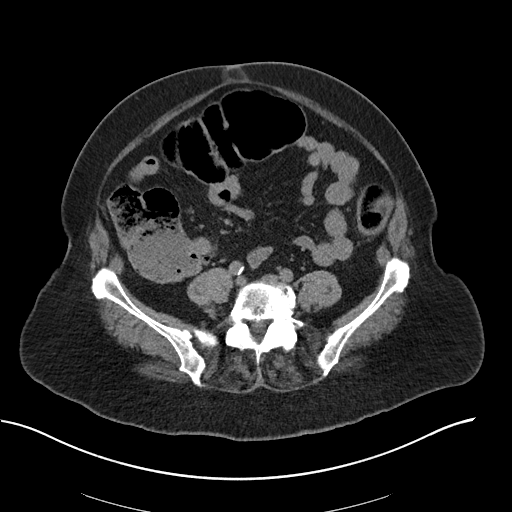
[im 48/100  soft-tissue]
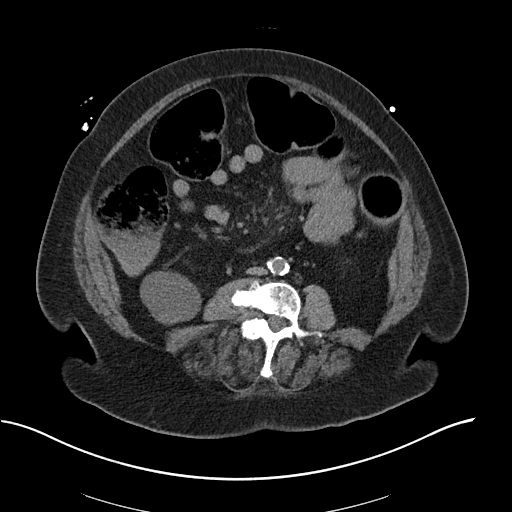
[im 52/100  soft-tissue]
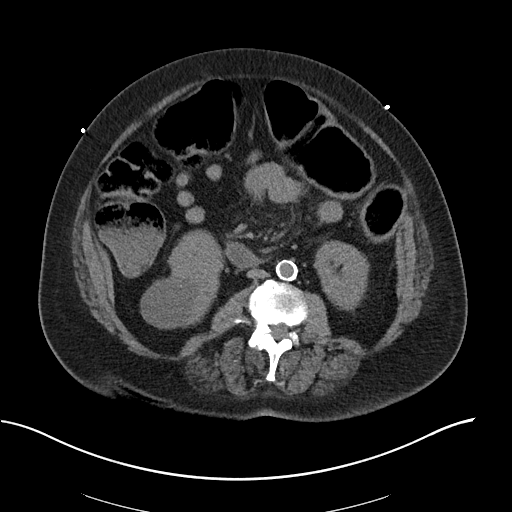
[im 62/100  soft-tissue]
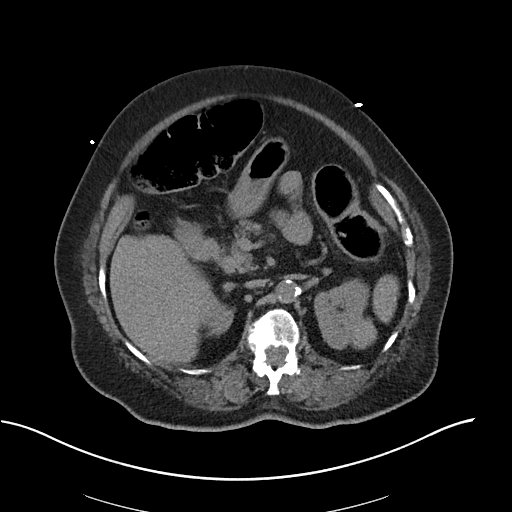
[im 71/100  soft-tissue]
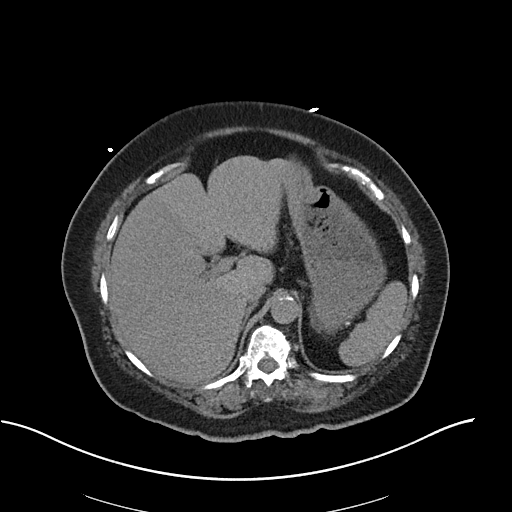
[im 71/100  bone]
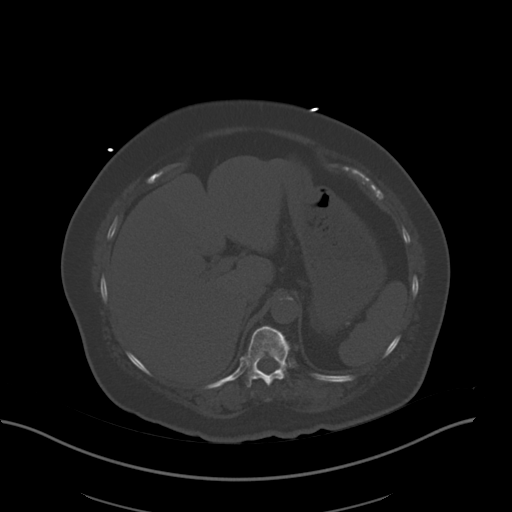
[im 76/100  soft-tissue]
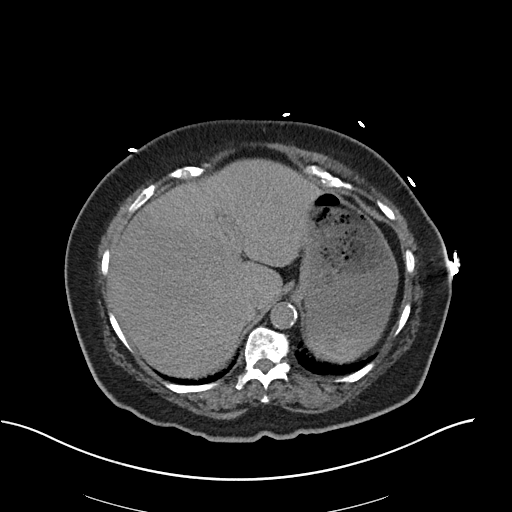
[im 85/100  soft-tissue]
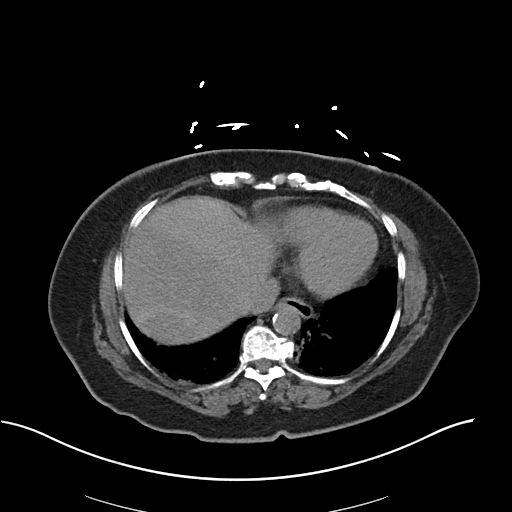
[im 95/100  soft-tissue]
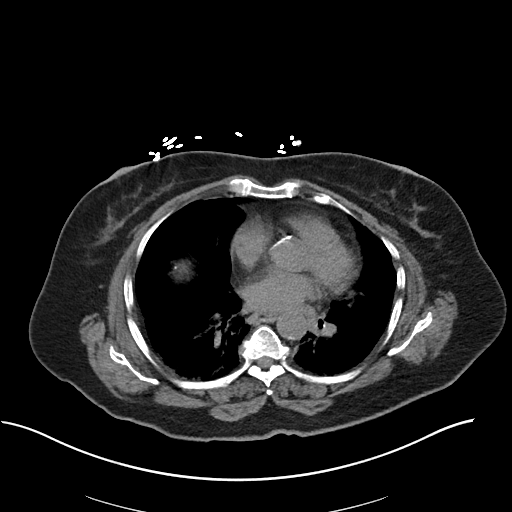

[Series 6: cor st · coronal · 0.69mm/px · 3 of 104 slices shown]
[im 35/104  soft-tissue]
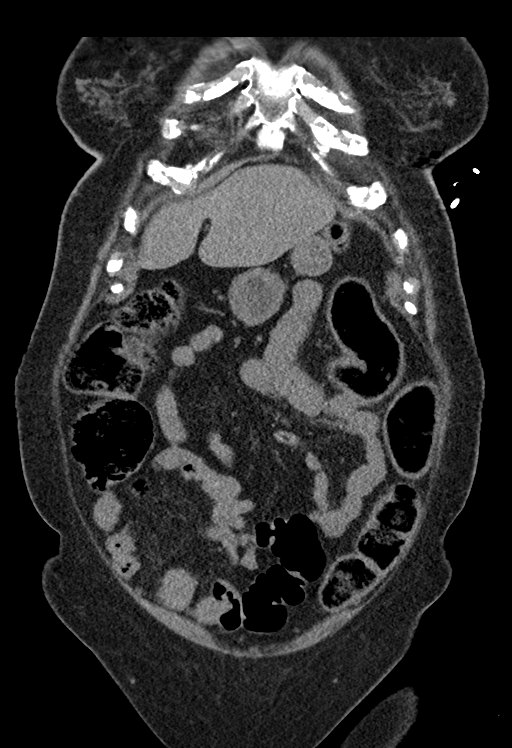
[im 46/104  soft-tissue]
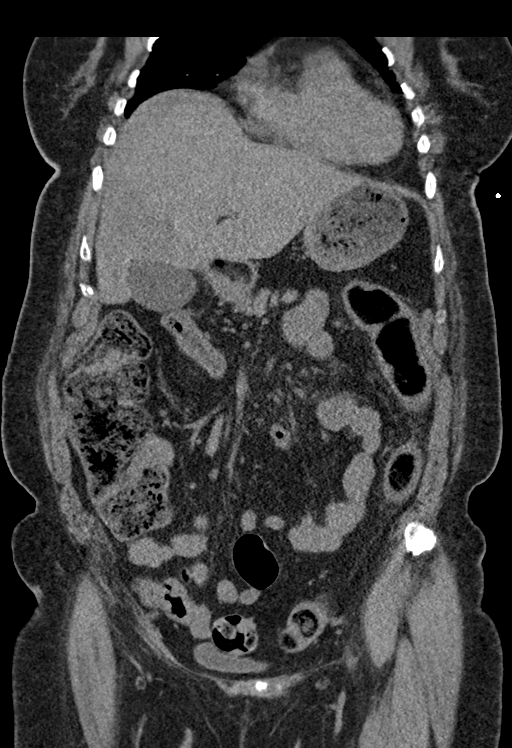
[im 58/104  soft-tissue]
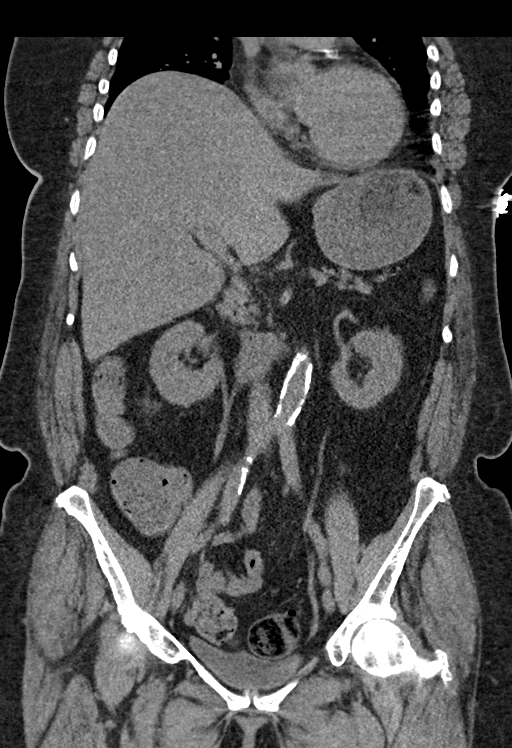

[15 of 46 positions shown; findings below may reference images not displayed]

FINDINGS: Evaluation of this exam is limited in the absence of intravenous
contrast.

Lower chest: There are bibasilar atelectatic changes.

No intra-abdominal free air or free fluid.

Hepatobiliary: Diffuse fatty infiltration of the liver. There is a
stone in the gallbladder. No pericholecystic fluid or evidence of
acute cholecystitis by CT.

Pancreas: Unremarkable. No pancreatic ductal dilatation or
surrounding inflammatory changes.

Spleen: Normal in size without focal abnormality.

Adrenals/Urinary Tract: The adrenal glands are unremarkable. Right
renal hypodense lesions measure up to 5 cm in the lower pole. The
larger lesion demonstrates a lobulated margin and is not well
evaluated on this noncontrast CT. Further evaluation with MRI or
ultrasound on a nonemergent basis recommended. Subcentimeter low
attenuating focus in the lateral interpolar left kidney appears to
demonstrate fat attenuation and may represent a small
angiomyolipoma. There is no hydronephrosis or nephrolithiasis on
either side. The visualized ureters and urinary bladder appear
unremarkable.

Stomach/Bowel: There is moderate amount of air and stool throughout
the colon. There is no bowel obstruction or active inflammation. The
appendix is normal.

Vascular/Lymphatic: Advanced aortoiliac atherosclerotic disease. No
portal venous gas. There is no adenopathy.

Reproductive: Hysterectomy. No pelvic mass.

Other: Small fat.

Musculoskeletal: Degenerative changes of the spine. No acute osseous
pathology.
IMPRESSION: 1. Moderate stool and air in the colon. No bowel obstruction or
active inflammation. Normal appendix.
2. Fatty liver.
3. Cholelithiasis.
4. Right renal hypodense lesions as described. Further evaluation
with MRI or ultrasound on a nonemergent basis recommended.

## 2020-09-22 IMAGING — DX DG ABDOMEN 1V
1 series · 1 of 1 positions shown · non-contrast
Comparison: 05/17/2018

CLINICAL DATA: Abdominal pain, distention

EXAM:
ABDOMEN - 1 VIEW

[abdomen kub]
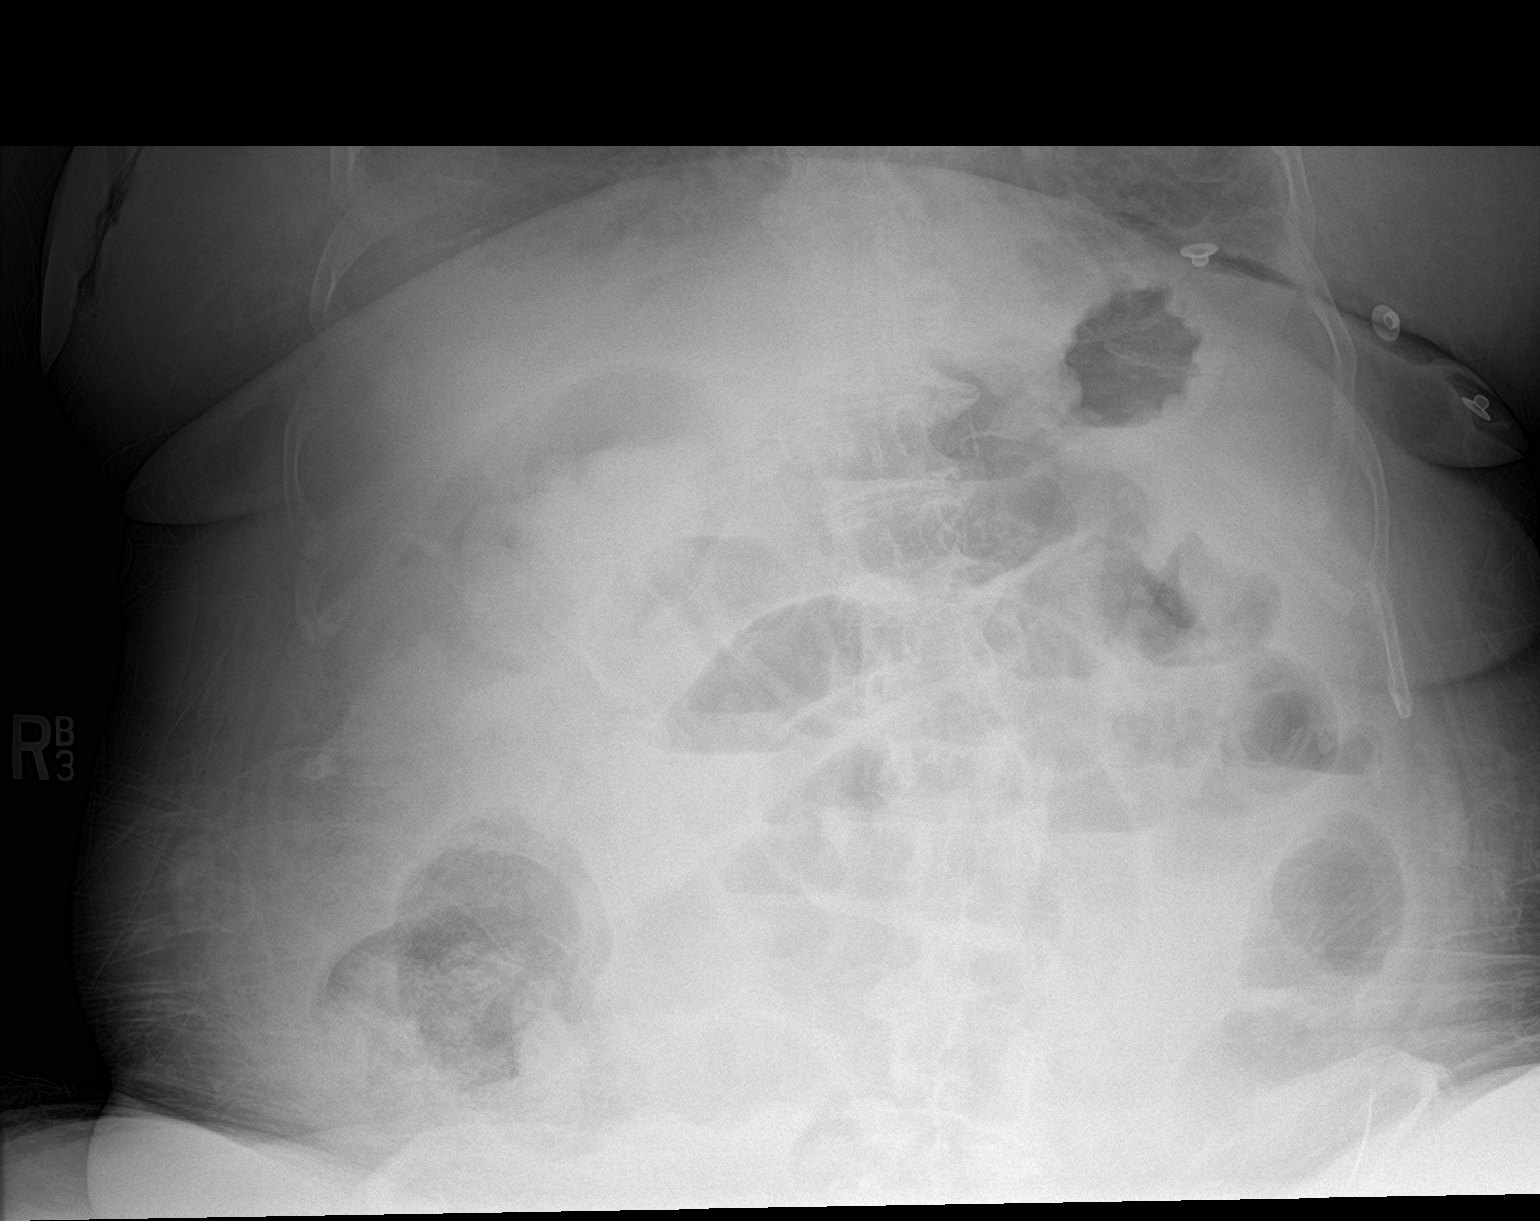

[1 of 1 positions shown; findings below may reference images not displayed]

FINDINGS: Mild gaseous distention of both large and small bowel loops.
Moderate to large stool burden in the colon. No organomegaly or free
air.
IMPRESSION: Mild diffuse gaseous distention of both large and small bowel may
reflect ileus. Moderate to large stool burden.

## 2020-09-23 IMAGING — DX DG ABDOMEN 1V
1 series · 1 of 1 positions shown · non-contrast
Comparison: Abdominal radiograph May 19, 2018 at 0055 hours

CLINICAL DATA: Colitis.

EXAM:
ABDOMEN - 1 VIEW

[abdomen erect]
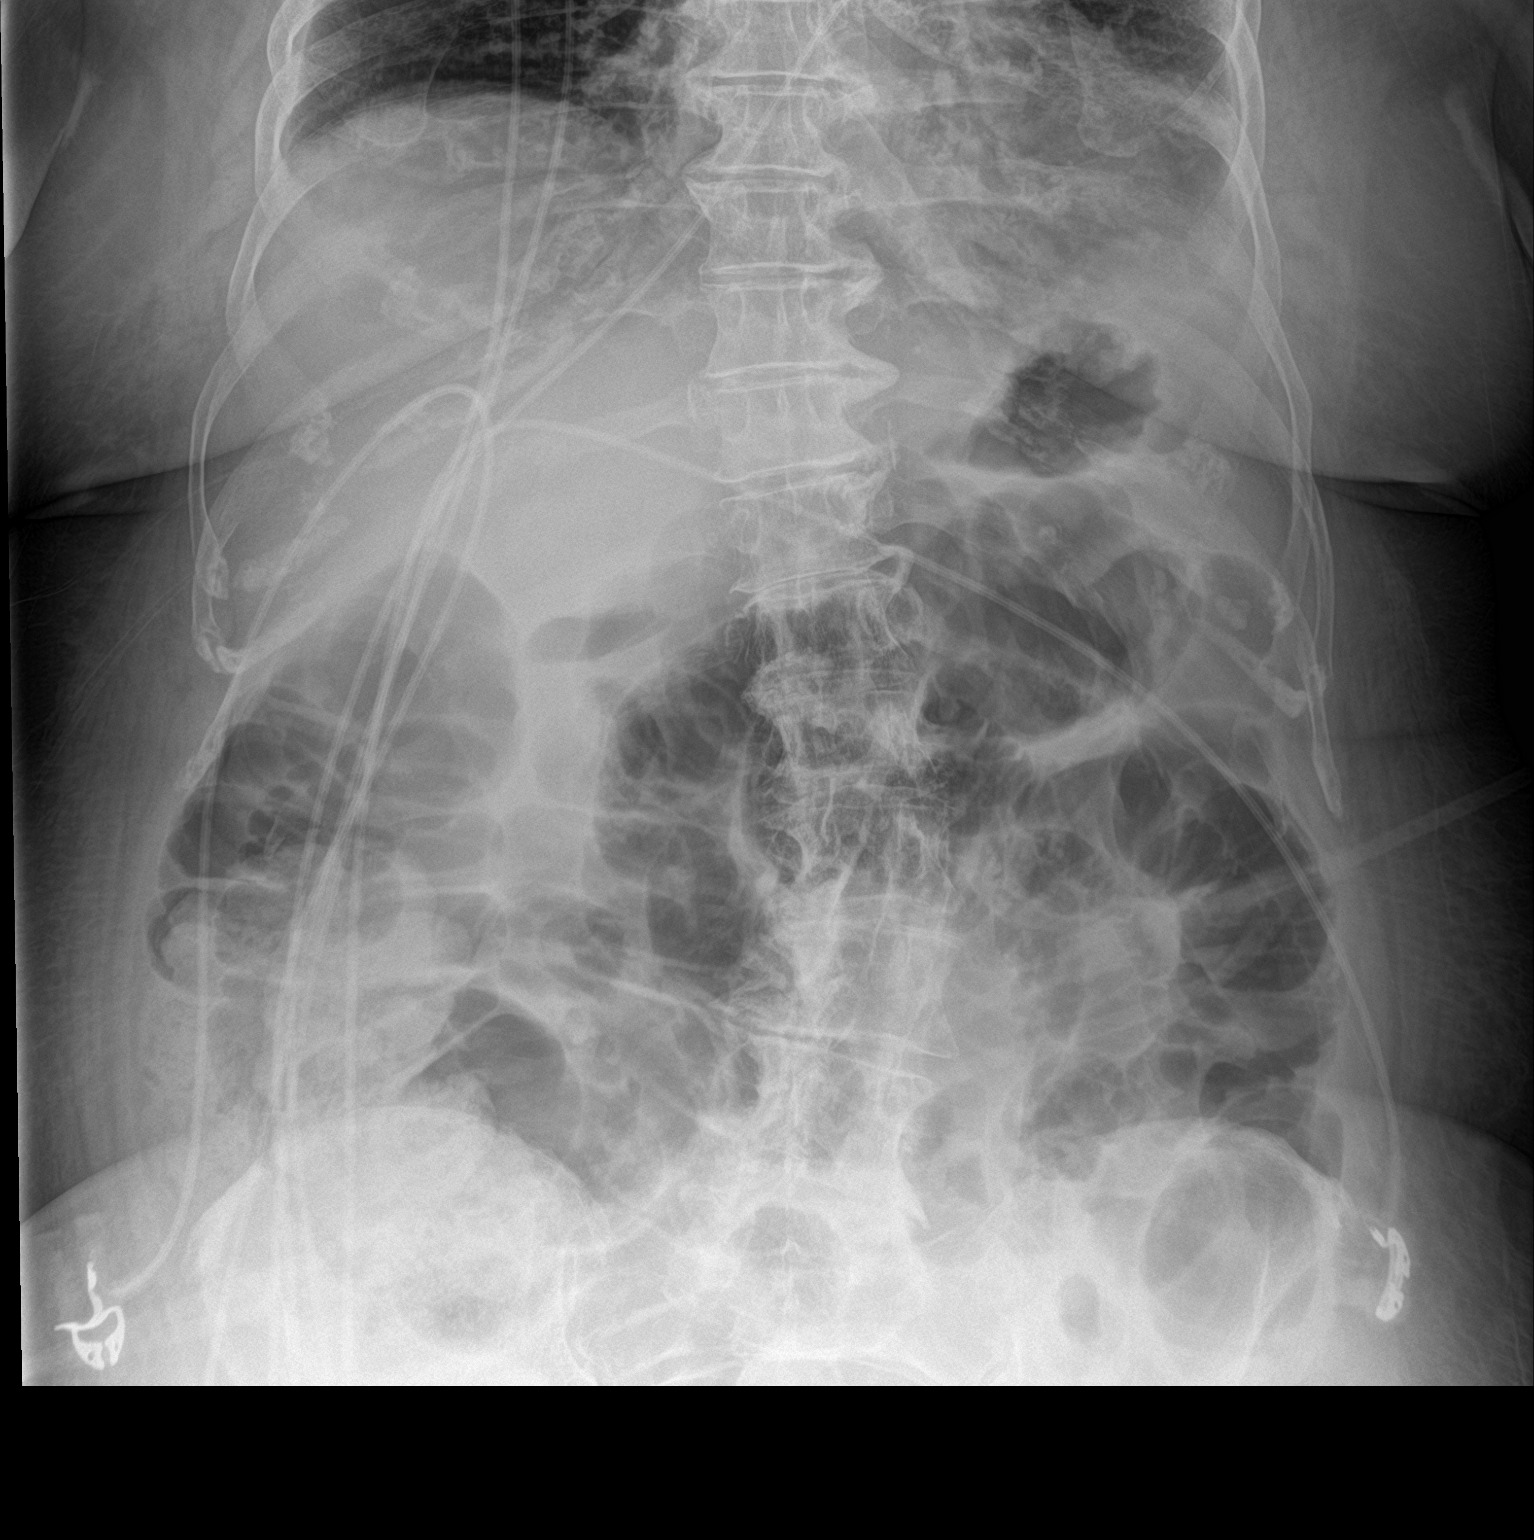

[1 of 1 positions shown; findings below may reference images not displayed]

FINDINGS: Gas distended colon and to lesser extent small bowel is similar. No
intraperitoneal free air. No intra-abdominal mass effect. Rectum is
not included in field of view. Small suspected LEFT pleural
effusion. Lumbar levoscoliosis.
IMPRESSION: 1. Persistent gas distended colon and to lesser extent small bowel
seen with distal bowel obstruction or ileus. No free air.

## 2020-10-05 ENCOUNTER — Other Ambulatory Visit: Payer: Self-pay | Admitting: Adult Health

## 2020-10-05 DIAGNOSIS — G47411 Narcolepsy with cataplexy: Secondary | ICD-10-CM

## 2020-10-05 NOTE — Telephone Encounter (Signed)
Pt is needing a refill on her methylphenidate (RITALIN) 20 MG tablet sent in to the [facility] on [address].

## 2020-10-08 ENCOUNTER — Other Ambulatory Visit: Payer: Self-pay | Admitting: *Deleted

## 2020-10-08 ENCOUNTER — Telehealth: Payer: Self-pay | Admitting: Endocrinology

## 2020-10-08 MED ORDER — BD PEN NEEDLE MINI U/F 31G X 5 MM MISC: 3 refills | Status: DC

## 2020-10-08 NOTE — Telephone Encounter (Signed)
Pt states that the prescription that was written different States thatt she takes her insulin  once a day and she takes it three times a day and states that a new prescription needs to be sent In for   Insulin Pen Needle (B-D UF III MINI PEN NEEDLES) 31G X 5 MM MISC   [id number] - [place] ([place] - [address]

## 2020-10-08 NOTE — Telephone Encounter (Signed)
Rx updated and re-sent to patients pharmacy.

## 2020-10-08 NOTE — Telephone Encounter (Addendum)
[place] [facility] registry Verified LR:03-19-20 Qty: 135 for days  Last OV:12/26/19 Pending appointment: 11-27-20

## 2020-10-09 MED ORDER — METHYLPHENIDATE HCL 20 MG PO TABS: 10.0000 mg | ORAL_TABLET | Freq: Three times a day (TID) | ORAL | 0 refills | Status: DC

## 2020-10-15 ENCOUNTER — Other Ambulatory Visit: Payer: Self-pay | Admitting: Endocrinology

## 2020-10-18 ENCOUNTER — Other Ambulatory Visit: Payer: Self-pay

## 2020-10-18 ENCOUNTER — Ambulatory Visit: Payer: Medicare Other | Admitting: Endocrinology

## 2020-10-18 VITALS — BP 170/80 | HR 100 | Ht 62.0 in | Wt 170.2 lb

## 2020-10-18 DIAGNOSIS — E782 Mixed hyperlipidemia: Secondary | ICD-10-CM

## 2020-10-18 DIAGNOSIS — E063 Autoimmune thyroiditis: Secondary | ICD-10-CM | POA: Diagnosis not present

## 2020-10-18 DIAGNOSIS — E11649 Type 2 diabetes mellitus with hypoglycemia without coma: Secondary | ICD-10-CM

## 2020-10-18 LAB — T4, FREE: Free T4: 0.85 ng/dL (ref 0.60–1.60)

## 2020-10-18 LAB — LIPID PANEL
Cholesterol: 182 mg/dL (ref 0–200)
HDL: 47.8 mg/dL (ref >39.00)
NonHDL: 134.37
Total CHOL/HDL Ratio: 4
Triglycerides: 236 mg/dL — ABNORMAL HIGH (ref 0.0–149.0)
VLDL: 47.2 mg/dL — ABNORMAL HIGH (ref 0.0–40.0)

## 2020-10-18 LAB — COMPREHENSIVE METABOLIC PANEL
ALT: 27 U/L (ref 0–35)
AST: 20 U/L (ref 0–37)
Albumin: 4.6 g/dL (ref 3.5–5.2)
Alkaline Phosphatase: 84 U/L (ref 39–117)
BUN: 19 mg/dL (ref 6–23)
CO2: 31 mEq/L (ref 19–32)
Calcium: 9.2 mg/dL (ref 8.4–10.5)
Chloride: 97 mEq/L (ref 96–112)
Creatinine, Ser: 0.93 mg/dL (ref 0.40–1.20)
GFR: 57.86 mL/min — ABNORMAL LOW (ref >60.00)
Glucose, Bld: 111 mg/dL — ABNORMAL HIGH (ref 70–99)
Potassium: 3.9 mEq/L (ref 3.5–5.1)
Sodium: 136 mEq/L (ref 135–145)
Total Bilirubin: 0.4 mg/dL (ref 0.2–1.2)
Total Protein: 7.3 g/dL (ref 6.0–8.3)

## 2020-10-18 LAB — POCT GLYCOSYLATED HEMOGLOBIN (HGB A1C): Hemoglobin A1C: 7.3 % — AB (ref 4.0–5.6)

## 2020-10-18 LAB — TSH: TSH: 2.42 u[IU]/mL (ref 0.35–4.50)

## 2020-10-18 LAB — MICROALBUMIN / CREATININE URINE RATIO
Creatinine,U: 68.2 mg/dL
Microalb Creat Ratio: 1 mg/g (ref 0.0–30.0)
Microalb, Ur: <0.7 mg/dL (ref 0.0–1.9)

## 2020-10-18 NOTE — Progress Notes (Signed)
Patient ID: Amanda King, female   DOB: 28-Jul-1940, 80 y.o.   MRN: [id number]           Reason for Appointment: Follow-up for Type 2 Diabetes  Referring PCP: Dr. Milinda Antis   History of Present Illness:          Date of diagnosis of type 2 diabetes mellitus:  2008      Background history:  She was initially treated with metformin and subsequently glipizide added Januvia was added in 12/2016 She has had variable control with A1c as high as 10.3 done in 2015 and as low as 7.3 done in April 2016 Has generally been followed by her PCP about twice a year  She was tried on [place] in 2020  Recent history:     Non-insulin hypoglycemic drugs:  glipizide ER 10 mg daily, metformin ER 750 mg twice daily  INSULIN doses: Lantus 14 units daily acs, Humalog 4 units at breakfast and supper  Her A1c was previously 6.7 % and now 7.3 Last visit was 03/2020  Current management, blood sugar patterns and problems identified:  She did bring her monitor for download  She says she has been stressed because of loss of family member in January  Because of difficulty sleeping she will sometimes have snacks like chips at night before bedtime and her fasting readings appear to be mostly higher now  As before she does not check readings very often and mostly in the morning only  Does not exercise except occasionally on her stationary bike  Weight is about the same   Dinner 6 p.m. usually       Side effects from medications have been: Diarrhea from high dose metformin            Glucose monitoring:  done less than 1 times a day         Glucometer:  One Touch       Blood Glucose readings by time of day    PRE-MEAL Fasting Lunch Dinner Bedtime Overall  Glucose range:  150-170  105-130  85  199   Mean/median:  159    139   POST-MEAL PC Breakfast PC Lunch PC Dinner  Glucose range:     Mean/median: ?     Previously:  PRE-MEAL Fasting Lunch Dinner Bedtime Overall  Glucose range:  113-159       Mean/median:  140     136   POST-MEAL PC Breakfast PC Lunch PC Dinner  Glucose range:    104-167  Mean/median:    142     Dietician visit, most recent: 08/12/2018  Weight history:  Wt Readings from Last 3 Encounters:  10/18/20 170 lb 3.2 oz (77.2 kg)  05/24/20 168 lb 4 oz (76.3 kg)  04/06/20 169 lb (76.7 kg)    Glycemic control:   Lab Results  Component Value Date   HGBA1C 7.3 (A) 10/18/2020   HGBA1C 6.7 (A) 04/06/2020   HGBA1C 7.0 (A) 12/27/2019   Lab Results  Component Value Date   MICROALBUR <0.7 12/27/2019   LDLCALC 61 12/27/2019   CREATININE 0.97 05/17/2020   Lab Results  Component Value Date   MICRALBCREAT 2.2 12/27/2019    Lab Results  Component Value Date   FRUCTOSAMINE 267 11/22/2018   FRUCTOSAMINE 349 (H) 08/18/2018    Office Visit on 10/18/2020  Component Date Value Ref Range Status  . Hemoglobin A1C 10/18/2020 7.3* 4.0 - 5.6 % Final    Allergies as of 10/18/2020  Reactions   Penicillins Other (See Comments)   Has patient had a PCN reaction causing immediate rash, facial/tongue/throat swelling, SOB or lightheadedness with hypotension: Unknown Has patient had a PCN reaction causing severe rash involving mucus membranes or skin necrosis: Unknown Has patient had a PCN reaction that required hospitalization: Unknown Has patient had a PCN reaction occurring within the last 10 years: No If all of the above answers are "NO", then may proceed with Cephalosporin use.      Medication List       Accurate as of Oct 18, 2020  3:08 PM. If you have any questions, ask your nurse or doctor.        Align 4 MG Caps Take by mouth daily.   B-D UF III MINI PEN NEEDLES 31G X 5 MM Misc Generic drug: Insulin Pen Needle USE THREE PEN NEEDLES PER DAY TO INJECT INSULIN   cetirizine 10 MG tablet Commonly known as: ZYRTEC Take 10 mg by mouth daily as needed for allergies or rhinitis.   Euthyrox 75 MCG tablet Generic drug: levothyroxine TAKE 1 TABLET BY  MOUTH ONCE DAILY BEFORE BREAKFAST   glipiZIDE 10 MG 24 hr tablet Commonly known as: GLUCOTROL XL Take 1 tablet by mouth once daily with breakfast   glucose blood test strip Use onetouch verio test strip as instructed to check blood sugar once daily.   HumaLOG KwikPen 100 UNIT/ML KwikPen Generic drug: insulin lispro INJECT 4 UNITS SUBCUTANEOUSLY TWICE DAILY BEFORE MEAL(S)   hydrochlorothiazide 25 MG tablet Commonly known as: HYDRODIURIL Take 1/2 (one-half) tablet by mouth once daily   imipramine 50 MG tablet Commonly known as: TOFRANIL TAKE 1 TABLET BY MOUTH AT BEDTIME   Lantus SoloStar 100 UNIT/ML Solostar Pen Generic drug: insulin glargine START INJECT 12 UNITS INTO THE SKIN ONCE DAILY AND INCREASE AS DIRECTED   lisinopril 20 MG tablet Commonly known as: ZESTRIL Take 1 tablet (20 mg total) by mouth daily.   metFORMIN 750 MG 24 hr tablet Commonly known as: GLUCOPHAGE-XR Take 1 tablet (750 mg total) by mouth 2 (two) times daily.   methylphenidate 20 MG tablet Commonly known as: RITALIN Take 0.5 tablets (10 mg total) by mouth 3 (three) times daily with meals.   OneTouch Delica Lancets 30G Misc USE 1  TO CHECK GLUCOSE ONCE DAILY   polyethylene glycol 17 g packet Commonly known as: MIRALAX / GLYCOLAX Take 17 g by mouth daily as needed.   simvastatin 80 MG tablet Commonly known as: ZOCOR Take 1 tablet (80 mg total) by mouth at bedtime.       Allergies:  Allergies  Allergen Reactions  . Penicillins Other (See Comments)    Has patient had a PCN reaction causing immediate rash, facial/tongue/throat swelling, SOB or lightheadedness with hypotension: Unknown Has patient had a PCN reaction causing severe rash involving mucus membranes or skin necrosis: Unknown Has patient had a PCN reaction that required hospitalization: Unknown Has patient had a PCN reaction occurring within the last 10 years: No If all of the above answers are "NO", then may proceed with  Cephalosporin use.     Past Medical History:  Diagnosis Date  . Diabetes mellitus    Type II  . Hyperlipidemia   . Hypertension   . Hypothyroidism   . Migraine   . Narcolepsy with cataplexy   . Pneumonia 08/2001    Past Surgical History:  Procedure Laterality Date  . APPENDECTOMY  age 46  . BIOPSY  05/24/2018   Procedure: BIOPSY;  Surgeon: Lemar Lofty., MD;  Location: [facility] ENDOSCOPY;  Service: Gastroenterology;;  . COLONOSCOPY N/A 05/24/2018   Procedure: COLONOSCOPY;  Surgeon: Lemar Lofty., MD;  Location: [facility] ENDOSCOPY;  Service: Gastroenterology;  Laterality: N/A;  . hemilaminectomy     C5-6; C6-7  . PARTIAL HYSTERECTOMY  age 9  . POSTERIOR CERVICAL FUSION/FORAMINOTOMY  02/05/1975   C5-C6-C7    Family History  Problem Relation Age of Onset  . Depression Brother        suicide  . Diabetes Brother   . Heart disease Mother        MI  . Peripheral vascular disease Mother   . Hyperlipidemia Mother   . Diabetes Brother   . Sleep apnea Son   . Diabetes Son   . Sleep apnea Brother     Social History:  reports that she quit smoking about 40 years ago. She has never used smokeless tobacco. She reports that she does not drink alcohol and does not use drugs.   Review of Systems   Lipid history: Has been treated by her PCP with simvastatin 80 mg Has had high triglycerides   Lab Results  Component Value Date   CHOL 174 05/17/2020   HDL 45.30 05/17/2020   LDLCALC 61 12/27/2019   LDLDIRECT 87.0 05/17/2020   TRIG 290.0 (H) 05/17/2020   CHOLHDL 4 05/17/2020           Hypertension: Has been treated with HCTZ, 12.5 mg lisinopril 20 mg by her PCP   BP Readings from Last 3 Encounters:  10/18/20 (!) 170/80  05/24/20 140/80  04/06/20 128/78    Most recent eye exam was in 8/20  Most recent foot exam: 7/21   Hypothyroidism: Taking 75 mcg of Euthyrox and last TSH was increased but her dose was not changed by her PCP   Lab Results  Component  Value Date   TSH 4.82 (H) 05/17/2020   TSH 1.04 12/27/2019   TSH 2.57 08/23/2019   FREET4 0.95 12/27/2019   FREET4 0.91 08/23/2019   FREET4 1.00 02/22/2019      LABS:  Office Visit on 10/18/2020  Component Date Value Ref Range Status  . Hemoglobin A1C 10/18/2020 7.3* 4.0 - 5.6 % Final    Physical Examination:  BP (!) 170/80   Pulse 100   Ht 5\' 2"  (1.575 m)   Wt 170 lb 3.2 oz (77.2 kg)   SpO2 98%   BMI 31.13 kg/m      ASSESSMENT:  Diabetes type 2   See history of present illness for detailed discussion of current diabetes management, blood sugar patterns and problems identified  Her A1c is 7.3, previously 6.7  Blood sugars are improved compared to last visit Likely she has done little better with her diet although still can do better because of her weight gain As before she cannot afford brand-name medications and is on basal bolus insulin, Metformin and glipizide  With increasing her insulin doses slightly blood sugars are also relatively better although recently not checking enough Fasting glucose appears to be higher now   HYPERTENSION: Blood pressure is higher, followed by PCP and needs to make follow-up appointment Also needs follow-up urine microalbumin  PLAN:    She will try to cut back on snacks especially at night Go up to 16 units on Lantus More regular exercise To check more readings after dinner at night Check thyroid levels today and adjust her levothyroxine as needed   Patient Instructions  Lantus 16 units  daily and go to 18 if am sugar stays OVER 140 in am next week  Check blood sugars on waking up 3 days a week  Also check blood sugars about 2 hours after meals and do this after different meals by rotation  Recommended blood sugar levels on waking up are 90-130 and about 2 hours after meal is 130-160  Please bring your blood sugar monitor to each visit, thank you        Reather Littler 10/18/2020, 3:08 PM   Note: This office note  was prepared with Dragon voice recognition system technology. Any transcriptional errors that result from this process are unintentional.

## 2020-10-18 NOTE — Patient Instructions (Addendum)
Lantus 16 units daily and go to 18 if am sugar stays OVER 140 in am next week  Check blood sugars on waking up 3 days a week  Also check blood sugars about 2 hours after meals and do this after different meals by rotation  Recommended blood sugar levels on waking up are 90-130 and about 2 hours after meal is 130-160  Please bring your blood sugar monitor to each visit, thank you

## 2020-10-19 LAB — LDL CHOLESTEROL, DIRECT: Direct LDL: 96 mg/dL

## 2020-10-29 ENCOUNTER — Telehealth: Payer: Self-pay

## 2020-10-29 DIAGNOSIS — E11649 Type 2 diabetes mellitus with hypoglycemia without coma: Secondary | ICD-10-CM

## 2020-10-29 MED ORDER — FENOFIBRATE 145 MG PO TABS: 145.0000 mg | ORAL_TABLET | Freq: Every day | ORAL | 1 refills | Status: DC

## 2020-10-29 NOTE — Telephone Encounter (Addendum)
Called and left a detailed message for pt advising Rx sent to preferred pharmacy and lab results. ----- Message from Reather Littler, MD sent at 10/29/2020  1:54 PM EDT ----- Please call: Thyroid test is back to normal, no change needed.  However triglycerides or blood fats are high and will need to start another medication fenofibrate 145 mg daily while reducing the simvastatin to half a tablet

## 2020-10-29 NOTE — Progress Notes (Signed)
Please call: Thyroid test is back to normal, no change needed.  However triglycerides or blood fats are high and will need to start another medication fenofibrate 145 mg daily while reducing the simvastatin to half a tablet

## 2020-11-06 ENCOUNTER — Other Ambulatory Visit: Payer: Self-pay | Admitting: Endocrinology

## 2020-11-27 ENCOUNTER — Encounter: Payer: Self-pay | Admitting: Adult Health

## 2020-11-27 ENCOUNTER — Ambulatory Visit: Payer: Medicare Other | Admitting: Adult Health

## 2020-11-27 VITALS — BP 168/70 | HR 99 | Ht 62.0 in | Wt 170.4 lb

## 2020-11-27 DIAGNOSIS — G47411 Narcolepsy with cataplexy: Secondary | ICD-10-CM | POA: Diagnosis not present

## 2020-11-27 NOTE — Patient Instructions (Signed)
Your Plan:  Continue Ritalin and tofranil  If your symptoms worsen or you develop new symptoms please let us know.   Thank you for coming to see [place] at [facility]. I hope we have been able to provide you high quality care today.  You may receive a patient satisfaction survey over the next few weeks. We would appreciate your feedback and comments so that we may continue to improve ourselves and the health of our patients.

## 2020-11-27 NOTE — Progress Notes (Signed)
PATIENT: Amanda King DOB: Sep 22, 1940  REASON FOR VISIT: follow up HISTORY FROM: patient Primary neurologist : Dr. Vickey Huger  HISTORY OF PRESENT ILLNESS: Today 11/27/20:  Amanda King is an 80 year old female with a history of narcolepsy and cataplexy.  She returns today for follow-up.  She states that she was unable to reduce her dose of Ritalin.  Reports that her son died of a massive heart attack and he was her caregiver.  She states that she has been dealing with grief therefore has not attempted to reduce her dose.  She remains on Tofranil.  She states that yesterday she had a paralyzing spell that lasted for few minutes.  She reports that this is the first time this has occurred in quite some time.  She denies any cataplectic events.  She returns today for evaluation.  12/26/19: [name] is an 80 year old female with a history of narcolepsy with cataplexy.  She returns today for follow-up.  She remains on Tofranil and Ritalin.  She reports that the combination is working well for her.  Denies any cataplectic events.  Denies falling asleep spontaneously.  Overall she feels that she is doing well.  She returns today for an evaluation.  HISTORY  06/28/19 :   [name] is a 80 year old female with a history of narcolepsy with cataplexy.  She returns today for follow-up.  She is currently on Tofranil and Ritalin for narcolepsy.  She reports that this is working well for her.  She denies falling asleep during the day.  She does state occasionally she will lay down for a nap.  She denies falling asleep while driving.  Denies any cataplectic events.  Her blood pressure and heart rate is slightly elevated today.  REVIEW OF SYSTEMS: Out of a complete 14 system review of symptoms, the patient complains only of the following symptoms, and all other reviewed systems are negative.  See HPI  ALLERGIES: Allergies  Allergen Reactions   Penicillins Other (See Comments)    Has patient had a PCN  reaction causing immediate rash, facial/tongue/throat swelling, SOB or lightheadedness with hypotension: Unknown Has patient had a PCN reaction causing severe rash involving mucus membranes or skin necrosis: Unknown Has patient had a PCN reaction that required hospitalization: Unknown Has patient had a PCN reaction occurring within the last 10 years: No If all of the above answers are "NO", then may proceed with Cephalosporin use.     HOME MEDICATIONS: Outpatient Medications Prior to Visit  Medication Sig Dispense Refill   cetirizine (ZYRTEC) 10 MG tablet Take 10 mg by mouth daily as needed for allergies or rhinitis.      EUTHYROX 75 MCG tablet TAKE 1 TABLET BY MOUTH ONCE DAILY BEFORE BREAKFAST 30 tablet 5   fenofibrate (TRICOR) 145 MG tablet Take 1 tablet (145 mg total) by mouth daily. 90 tablet 1   glipiZIDE (GLUCOTROL XL) 10 MG 24 hr tablet Take 1 tablet by mouth once daily with breakfast 90 tablet 1   glucose blood test strip Use onetouch verio test strip as instructed to check blood sugar once daily. 100 each 1   HUMALOG KWIKPEN 100 UNIT/ML KwikPen INJECT 4 UNITS SUBCUTANEOUSLY TWICE DAILY BEFORE MEAL(S) 15 mL 0   hydrochlorothiazide (HYDRODIURIL) 25 MG tablet Take 1/2 (one-half) tablet by mouth once daily 45 tablet 3   imipramine (TOFRANIL) 50 MG tablet TAKE 1 TABLET BY MOUTH AT BEDTIME 30 tablet 5   insulin glargine (LANTUS SOLOSTAR) 100 UNIT/ML Solostar Pen START INJECT 12  UNITS INTO THE SKIN ONCE DAILY AND INCREASE AS DIRECTED 15 mL 0   Insulin Pen Needle (B-D UF III MINI PEN NEEDLES) 31G X 5 MM MISC USE THREE PEN NEEDLES PER DAY TO INJECT INSULIN 100 each 3   lisinopril (ZESTRIL) 20 MG tablet Take 1 tablet (20 mg total) by mouth daily. 90 tablet 3   metFORMIN (GLUCOPHAGE-XR) 750 MG 24 hr tablet Take 1 tablet (750 mg total) by mouth 2 (two) times daily. 180 tablet 3   methylphenidate (RITALIN) 20 MG tablet Take 0.5 tablets (10 mg total) by mouth 3 (three) times daily with meals. 135  tablet 0   OneTouch Delica Lancets 30G MISC USE 1  TO CHECK GLUCOSE ONCE DAILY 100 each 0   polyethylene glycol (MIRALAX / GLYCOLAX) packet Take 17 g by mouth daily as needed. 14 each 0   Probiotic Product (ALIGN) 4 MG CAPS Take by mouth daily.     simvastatin (ZOCOR) 80 MG tablet Take 1 tablet (80 mg total) by mouth at bedtime. 90 tablet 3   No facility-administered medications prior to visit.    PAST MEDICAL HISTORY: Past Medical History:  Diagnosis Date   Diabetes mellitus    Type II   Hyperlipidemia    Hypertension    Hypothyroidism    Migraine    Narcolepsy with cataplexy    Pneumonia 08/2001    PAST SURGICAL HISTORY: Past Surgical History:  Procedure Laterality Date   APPENDECTOMY  age 59   BIOPSY  05/24/2018   Procedure: BIOPSY;  Surgeon: Lemar Lofty., MD;  Location: [facility] ENDOSCOPY;  Service: Gastroenterology;;   COLONOSCOPY N/A 05/24/2018   Procedure: COLONOSCOPY;  Surgeon: Lemar Lofty., MD;  Location: [facility] ENDOSCOPY;  Service: Gastroenterology;  Laterality: N/A;   hemilaminectomy     C5-6; C6-7   PARTIAL HYSTERECTOMY  age 25   POSTERIOR CERVICAL FUSION/FORAMINOTOMY  02/05/1975   C5-C6-C7    FAMILY HISTORY: Family History  Problem Relation Age of Onset   Depression Brother        suicide   Diabetes Brother    Heart disease Mother        MI   Peripheral vascular disease Mother    Hyperlipidemia Mother    Diabetes Brother    Sleep apnea Son    Diabetes Son    Sleep apnea Brother     SOCIAL HISTORY: Social History   Socioeconomic History   Marital status: Widowed    Spouse name: Not on file   Number of children: 3   Years of education: GED   Highest education level: Not on file  Occupational History   Occupation: works 1 day a week  Tobacco Use   Smoking status: Former    Pack years: 0.00    Types: Cigarettes    Quit date: 06/09/1980    Years since quitting: 40.4   Smokeless tobacco: Never  Vaping Use   Vaping Use: Never used   Substance and Sexual Activity   Alcohol use: No    Alcohol/week: 0.0 standard drinks   Drug use: No   Sexual activity: Never  Other Topics Concern   Not on file  Social History Narrative   Patient is widowed. and her son lives with her.   Walks for exercise.     Patient has three children.   Patient is right-handed.   Patient drinks three cups of caffeine daily.     Patient is retired.   Patient has a GED.   Social  Determinants of Health   Financial Resource Strain: Not on file  Food Insecurity: Not on file  Transportation Needs: Not on file  Physical Activity: Not on file  Stress: Not on file  Social Connections: Not on file  Intimate Partner Violence: Not on file      PHYSICAL EXAM  Vitals:   11/27/20 0849  BP: (!) 168/70  Pulse: 99  Weight: 170 lb 6.4 oz (77.3 kg)  Height: 5\' 2"  (1.575 m)   Body mass index is 31.17 kg/m.  Generalized: Well developed, in no acute distress   Neurological examination  Mentation: Alert oriented to time, place, history taking. Follows all commands speech and language fluent Cranial nerve II-XII: Pupils were equal round reactive to light. Extraocular movements were full, visual field were full on confrontational test.  Head turning and shoulder shrug  were normal and symmetric. Motor: The motor testing reveals 5 over 5 strength of all 4 extremities. Good symmetric motor tone is noted throughout.  Sensory: Sensory testing is intact to soft touch on all 4 extremities. No evidence of extinction is noted.  Coordination: Cerebellar testing reveals good finger-nose-finger and heel-to-shin bilaterally.  Gait and station: Gait is normal.  Reflexes: Deep tendon reflexes are symmetric and normal bilaterally.   DIAGNOSTIC DATA (LABS, IMAGING, TESTING) - I reviewed patient records, labs, notes, testing and imaging myself where available.  Lab Results  Component Value Date   WBC 8.5 05/17/2020   HGB 13.3 05/17/2020   HCT 40.1 05/17/2020    MCV 89.6 05/17/2020   PLT 272.0 05/17/2020      Component Value Date/Time   NA 136 10/18/2020 1437   K 3.9 10/18/2020 1437   CL 97 10/18/2020 1437   CO2 31 10/18/2020 1437   GLUCOSE 111 (H) 10/18/2020 1437   BUN 19 10/18/2020 1437   CREATININE 0.93 10/18/2020 1437   CALCIUM 9.2 10/18/2020 1437   PROT 7.3 10/18/2020 1437   ALBUMIN 4.6 10/18/2020 1437   AST 20 10/18/2020 1437   ALT 27 10/18/2020 1437   ALKPHOS 84 10/18/2020 1437   BILITOT 0.4 10/18/2020 1437   GFRNONAA >60 05/24/2018 0454   GFRAA >60 05/24/2018 0454   Lab Results  Component Value Date   CHOL 182 10/18/2020   HDL 47.80 10/18/2020   LDLCALC 61 12/27/2019   LDLDIRECT 96.0 10/18/2020   TRIG 236.0 (H) 10/18/2020   CHOLHDL 4 10/18/2020   Lab Results  Component Value Date   HGBA1C 7.3 (A) 10/18/2020   No results found for: VITAMINB12 Lab Results  Component Value Date   TSH 2.42 10/18/2020      ASSESSMENT AND PLAN 80 y.o. year old female  has a past medical history of Diabetes mellitus, Hyperlipidemia, Hypertension, Hypothyroidism, Migraine, Narcolepsy with cataplexy, and Pneumonia (08/2001). here with :  Narcolepsy with cataplexy  Continue Tofranil 50 mg at bedtime Continue Ritalin  at bedtime 10 mg TID Continue to monitor blood pressure and heart rate Advised if symptoms worsen or she develops new symptoms she should let 06-05-2005 know Follow-up in 6 months  with Dr. [place] or sooner if needed   Vickey Huger, MSN, NP-C 11/27/2020, 9:12 AM [facility] [address] [place], [place] [phone]

## 2020-12-08 ENCOUNTER — Other Ambulatory Visit: Payer: Self-pay | Admitting: Endocrinology

## 2021-01-28 ENCOUNTER — Other Ambulatory Visit: Payer: Self-pay

## 2021-01-28 ENCOUNTER — Ambulatory Visit (INDEPENDENT_AMBULATORY_CARE_PROVIDER_SITE_OTHER): Payer: Medicare Other | Admitting: Endocrinology

## 2021-01-28 ENCOUNTER — Other Ambulatory Visit: Payer: Self-pay | Admitting: Adult Health

## 2021-01-28 ENCOUNTER — Encounter: Payer: Self-pay | Admitting: Endocrinology

## 2021-01-28 VITALS — BP 160/82 | HR 90 | Ht 62.5 in | Wt 170.8 lb

## 2021-01-28 DIAGNOSIS — Z794 Long term (current) use of insulin: Secondary | ICD-10-CM | POA: Diagnosis not present

## 2021-01-28 DIAGNOSIS — I10 Essential (primary) hypertension: Secondary | ICD-10-CM

## 2021-01-28 DIAGNOSIS — G47411 Narcolepsy with cataplexy: Secondary | ICD-10-CM

## 2021-01-28 DIAGNOSIS — E063 Autoimmune thyroiditis: Secondary | ICD-10-CM | POA: Diagnosis not present

## 2021-01-28 DIAGNOSIS — E1165 Type 2 diabetes mellitus with hyperglycemia: Secondary | ICD-10-CM | POA: Diagnosis not present

## 2021-01-28 NOTE — Progress Notes (Signed)
Patient ID: Amanda HOLLORANfemale   DOB: [birth year], 80 y.o.   MRN: [id number]           Reason for Appointment: Follow-up for Type 2 Diabetes  Referring PCP: Dr. Milinda Antis   History of Present Illness:          Date of diagnosis of type 2 diabetes mellitus:  2008      Background history:  She was initially treated with metformin and subsequently glipizide added Januvia was added in 12/2016 She has had variable control with A1c as high as 10.3 done in 2015 and as low as 7.3 done in April 2016 Has generally been followed by her PCP about twice a year  She was tried on [place] in 2020  Recent history:     Non-insulin hypoglycemic drugs:  glipizide ER 10 mg daily, metformin ER 750 mg twice daily  INSULIN doses: Lantus 16  units daily acs, Humalog 4 units at breakfast and supper  Her A1c was previously 6.7 % and again 7.3   Current management, blood sugar patterns and problems identified: She did bring her monitor for download However she is checking her blood sugars mostly in the mornings at variable times and none in the afternoon or evening after meals Most of her sugars are higher in the morning except occasionally better after breakfast She says she has had snacks at bedtime like pretzels, chips and fruits including grapes  Despite increasing her Lantus by 2 units her morning sugars are about the same or higher  She is not motivated to exercise  She is trying to take her insulin doses consistently No hypoglycemic episodes Weight is about the same   Dinner 6 p.m. usually       Side effects from medications have been: Diarrhea from high dose metformin            Glucose monitoring:  done less than 1 times a day         Glucometer:  One Touch       Blood Glucose readings from meter by by time of day    PRE-MEAL Fasting Lunch Dinner Bedtime Overall  Glucose range: 155-183    121-209  Mean/median: 165    161   POST-MEAL PC Breakfast PC Lunch PC Dinner  Glucose  range: 121-209    Mean/median:      Previously:  PRE-MEAL Fasting Lunch Dinner Bedtime Overall  Glucose range:  150-170  105-130  85  199   Mean/median:  159    139   POST-MEAL PC Breakfast PC Lunch PC Dinner  Glucose range:     Mean/median: ?        Dietician visit, most recent: 08/12/2018  Weight history:  Wt Readings from Last 3 Encounters:  01/28/21 170 lb 12.8 oz (77.5 kg)  11/27/20 170 lb 6.4 oz (77.3 kg)  10/18/20 170 lb 3.2 oz (77.2 kg)    Glycemic control:   Lab Results  Component Value Date   HGBA1C 7.3 (A) 10/18/2020   HGBA1C 6.7 (A) 04/06/2020   HGBA1C 7.0 (A) 12/27/2019   Lab Results  Component Value Date   MICROALBUR <0.7 10/18/2020   LDLCALC 61 12/27/2019   CREATININE 0.93 10/18/2020   Lab Results  Component Value Date   MICRALBCREAT 1.0 10/18/2020    Lab Results  Component Value Date   FRUCTOSAMINE 267 11/22/2018   FRUCTOSAMINE 349 (H) 08/18/2018    No visits with results within 1 Week(s) from this visit.  Latest known visit with results is:  Office Visit on 10/18/2020  Component Date Value Ref Range Status   Hemoglobin A1C 10/18/2020 7.3 (A) 4.0 - 5.6 % Final   Free T4 10/18/2020 0.85  0.60 - 1.60 ng/dL Final   Comment: Specimens from patients who are undergoing biotin therapy and /or ingesting biotin supplements may contain high levels of biotin.  The higher biotin concentration in these specimens interferes with this Free T4 assay.  Specimens that contain high levels  of biotin may cause false high results for this Free T4 assay.  Please interpret results in light of the total clinical presentation of the patient.     TSH 10/18/2020 2.42  0.35 - 4.50 uIU/mL Final   Microalb, Ur 10/18/2020 <0.7  0.0 - 1.9 mg/dL Final   Creatinine,U 47/42/5956 68.2  mg/dL Final   Microalb Creat Ratio 10/18/2020 1.0  0.0 - 30.0 mg/g Final   Cholesterol 10/18/2020 182  0 - 200 mg/dL Final   ATP III Classification       Desirable:  < 200 mg/dL                Borderline High:  200 - 239 mg/dL          High:  > = 387 mg/dL   Triglycerides 56/43/3295 236.0 (A) 0.0 - 149.0 mg/dL Final   Normal:  <188 mg/dLBorderline High:  150 - 199 mg/dL   HDL 41/66/0630 16.01  >39.00 mg/dL Final   VLDL 09/32/3557 47.2 (A) 0.0 - 40.0 mg/dL Final   Total CHOL/HDL Ratio 10/18/2020 4   Final                  Men          Women1/2 Average Risk     3.4          3.3Average Risk          5.0          4.42X Average Risk          9.6          7.13X Average Risk          15.0          11.0                       NonHDL 10/18/2020 134.37   Final   NOTE:  Non-HDL goal should be 30 mg/dL higher than patient's LDL goal (i.e. LDL goal of < 70 mg/dL, would have non-HDL goal of < 100 mg/dL)   Sodium 32/20/2542 706  135 - 145 mEq/L Final   Potassium 10/18/2020 3.9  3.5 - 5.1 mEq/L Final   Chloride 10/18/2020 97  96 - 112 mEq/L Final   CO2 10/18/2020 31  19 - 32 mEq/L Final   Glucose, Bld 10/18/2020 111 (A) 70 - 99 mg/dL Final   BUN 23/76/2831 19  6 - 23 mg/dL Final   Creatinine, Ser 10/18/2020 0.93  0.40 - 1.20 mg/dL Final   Total Bilirubin 10/18/2020 0.4  0.2 - 1.2 mg/dL Final   Alkaline Phosphatase 10/18/2020 84  39 - 117 U/L Final   AST 10/18/2020 20  0 - 37 U/L Final   ALT 10/18/2020 27  0 - 35 U/L Final   Total Protein 10/18/2020 7.3  6.0 - 8.3 g/dL Final   Albumin 51/76/1607 4.6  3.5 - 5.2 g/dL Final   GFR 37/03/6268 57.86 (A) >60.00 mL/min Final  Calculated using the CKD-EPI Creatinine Equation (2021)   Calcium 10/18/2020 9.2  8.4 - 10.5 mg/dL Final   Direct LDL 16/03/9603 96.0  mg/dL Final   Optimal:  <540 mg/dLNear or Above Optimal:  100-129 mg/dLBorderline High:  130-159 mg/dLHigh:  160-189 mg/dLVery High:  >190 mg/dL    Allergies as of 9/81/1914       Reactions   Penicillins Other (See Comments)   Has patient had a PCN reaction causing immediate rash, facial/tongue/throat swelling, SOB or lightheadedness with hypotension: Unknown Has patient had a PCN reaction  causing severe rash involving mucus membranes or skin necrosis: Unknown Has patient had a PCN reaction that required hospitalization: Unknown Has patient had a PCN reaction occurring within the last 10 years: No If all of the above answers are "NO", then may proceed with Cephalosporin use.        Medication List        Accurate as of January 28, 2021  1:17 PM. If you have any questions, ask your nurse or doctor.          Align 4 MG Caps Take by mouth daily.   B-D UF III MINI PEN NEEDLES 31G X 5 MM Misc Generic drug: Insulin Pen Needle USE THREE PEN NEEDLES PER DAY TO INJECT INSULIN   cetirizine 10 MG tablet Commonly known as: ZYRTEC Take 10 mg by mouth daily as needed for allergies or rhinitis.   Euthyrox 75 MCG tablet Generic drug: levothyroxine TAKE 1 TABLET BY MOUTH ONCE DAILY BEFORE BREAKFAST   fenofibrate 145 MG tablet Commonly known as: TRICOR Take 1 tablet (145 mg total) by mouth daily.   glipiZIDE 10 MG 24 hr tablet Commonly known as: GLUCOTROL XL Take 1 tablet by mouth once daily with breakfast   glucose blood test strip Use onetouch verio test strip as instructed to check blood sugar once daily.   HumaLOG KwikPen 100 UNIT/ML KwikPen Generic drug: insulin lispro INJECT 4 UNITS SUBCUTANEOUSLY TWICE DAILY BEFORE MEAL(S)   hydrochlorothiazide 25 MG tablet Commonly known as: HYDRODIURIL Take 1/2 (one-half) tablet by mouth once daily   imipramine 50 MG tablet Commonly known as: TOFRANIL TAKE 1 TABLET BY MOUTH AT BEDTIME   Lantus SoloStar 100 UNIT/ML Solostar Pen Generic drug: insulin glargine INJECT 16 UNITS SUBCUTANEOUSLY ONCE DAILY AND  INCREASE  AS  DIRECTED   lisinopril 20 MG tablet Commonly known as: ZESTRIL Take 1 tablet (20 mg total) by mouth daily.   metFORMIN 750 MG 24 hr tablet Commonly known as: GLUCOPHAGE-XR Take 1 tablet (750 mg total) by mouth 2 (two) times daily.   methylphenidate 20 MG tablet Commonly known as: RITALIN Take 0.5  tablets (10 mg total) by mouth 3 (three) times daily with meals.   OneTouch Delica Lancets 30G Misc USE 1  TO CHECK GLUCOSE ONCE DAILY   polyethylene glycol 17 g packet Commonly known as: MIRALAX / GLYCOLAX Take 17 g by mouth daily as needed.   simvastatin 80 MG tablet Commonly known as: ZOCOR Take 1 tablet (80 mg total) by mouth at bedtime.        Allergies:  Allergies  Allergen Reactions   Penicillins Other (See Comments)    Has patient had a PCN reaction causing immediate rash, facial/tongue/throat swelling, SOB or lightheadedness with hypotension: Unknown Has patient had a PCN reaction causing severe rash involving mucus membranes or skin necrosis: Unknown Has patient had a PCN reaction that required hospitalization: Unknown Has patient had a PCN reaction occurring within the last  10 years: No If all of the above answers are "NO", then may proceed with Cephalosporin use.     Past Medical History:  Diagnosis Date   Diabetes mellitus    Type II   Hyperlipidemia    Hypertension    Hypothyroidism    Migraine    Narcolepsy with cataplexy    Pneumonia 08/2001    Past Surgical History:  Procedure Laterality Date   APPENDECTOMY  age 2   BIOPSY  05/24/2018   Procedure: BIOPSY;  Surgeon: Lemar Lofty., MD;  Location: [facility] ENDOSCOPY;  Service: Gastroenterology;;   COLONOSCOPY N/A 05/24/2018   Procedure: COLONOSCOPY;  Surgeon: Lemar Lofty., MD;  Location: [facility] ENDOSCOPY;  Service: Gastroenterology;  Laterality: N/A;   hemilaminectomy     C5-6; C6-7   PARTIAL HYSTERECTOMY  age 34   POSTERIOR CERVICAL FUSION/FORAMINOTOMY  02/05/1975   C5-C6-C7    Family History  Problem Relation Age of Onset   Depression Brother        suicide   Diabetes Brother    Heart disease Mother        MI   Peripheral vascular disease Mother    Hyperlipidemia Mother    Diabetes Brother    Sleep apnea Son    Diabetes Son    Sleep apnea Brother     Social History:   reports that she quit smoking about 40 years ago. Her smoking use included cigarettes. She has never used smokeless tobacco. She reports that she does not drink alcohol and does not use drugs.   Review of Systems   Lipid history: Has been treated by her PCP with simvastatin 80 mg Has had high triglycerides   Lab Results  Component Value Date   CHOL 182 10/18/2020   HDL 47.80 10/18/2020   LDLCALC 61 12/27/2019   LDLDIRECT 96.0 10/18/2020   TRIG 236.0 (H) 10/18/2020   CHOLHDL 4 10/18/2020           Hypertension: Has been treated with HCTZ, 12.5 mg lisinopril 20 mg by her PCP   BP Readings from Last 3 Encounters:  01/28/21 (!) 160/82  11/27/20 (!) 168/70  10/18/20 (!) 170/80    Most recent eye exam was in 8/20  Most recent foot exam: 7/21   Hypothyroidism: Taking 75 mcg of Euthyrox and last TSH was increased but her dose was not changed by her PCP   Lab Results  Component Value Date   TSH 2.42 10/18/2020   TSH 4.82 (H) 05/17/2020   TSH 1.04 12/27/2019   FREET4 0.85 10/18/2020   FREET4 0.95 12/27/2019   FREET4 0.91 08/23/2019      LABS:  No visits with results within 1 Week(s) from this visit.  Latest known visit with results is:  Office Visit on 10/18/2020  Component Date Value Ref Range Status   Hemoglobin A1C 10/18/2020 7.3 (A) 4.0 - 5.6 % Final   Free T4 10/18/2020 0.85  0.60 - 1.60 ng/dL Final   Comment: Specimens from patients who are undergoing biotin therapy and /or ingesting biotin supplements may contain high levels of biotin.  The higher biotin concentration in these specimens interferes with this Free T4 assay.  Specimens that contain high levels  of biotin may cause false high results for this Free T4 assay.  Please interpret results in light of the total clinical presentation of the patient.     TSH 10/18/2020 2.42  0.35 - 4.50 uIU/mL Final   Microalb, Ur 10/18/2020 <0.7  0.0 -  1.9 mg/dL Final   Creatinine,U 16/10/960405/05/2021 68.2  mg/dL Final    Microalb Creat Ratio 10/18/2020 1.0  0.0 - 30.0 mg/g Final   Cholesterol 10/18/2020 182  0 - 200 mg/dL Final   ATP III Classification       Desirable:  < 200 mg/dL               Borderline High:  200 - 239 mg/dL          High:  > = 540240 mg/dL   Triglycerides 98/11/914705/05/2021 236.0 (A) 0.0 - 149.0 mg/dL Final   Normal:  <829<150 mg/dLBorderline High:  150 - 199 mg/dL   HDL 56/21/308605/05/2021 57.8447.80  >39.00 mg/dL Final   VLDL 69/62/952805/05/2021 47.2 (A) 0.0 - 40.0 mg/dL Final   Total CHOL/HDL Ratio 10/18/2020 4   Final                  Men          Women1/2 Average Risk     3.4          3.3Average Risk          5.0          4.42X Average Risk          9.6          7.13X Average Risk          15.0          11.0                       NonHDL 10/18/2020 134.37   Final   NOTE:  Non-HDL goal should be 30 mg/dL higher than patient's LDL goal (i.e. LDL goal of < 70 mg/dL, would have non-HDL goal of < 100 mg/dL)   Sodium 41/32/440105/05/2021 027136  135 - 145 mEq/L Final   Potassium 10/18/2020 3.9  3.5 - 5.1 mEq/L Final   Chloride 10/18/2020 97  96 - 112 mEq/L Final   CO2 10/18/2020 31  19 - 32 mEq/L Final   Glucose, Bld 10/18/2020 111 (A) 70 - 99 mg/dL Final   BUN 25/36/644005/05/2021 19  6 - 23 mg/dL Final   Creatinine, Ser 10/18/2020 0.93  0.40 - 1.20 mg/dL Final   Total Bilirubin 10/18/2020 0.4  0.2 - 1.2 mg/dL Final   Alkaline Phosphatase 10/18/2020 84  39 - 117 U/L Final   AST 10/18/2020 20  0 - 37 U/L Final   ALT 10/18/2020 27  0 - 35 U/L Final   Total Protein 10/18/2020 7.3  6.0 - 8.3 g/dL Final   Albumin 34/74/259505/05/2021 4.6  3.5 - 5.2 g/dL Final   GFR 63/87/564305/05/2021 57.86 (A) >60.00 mL/min Final   Calculated using the CKD-EPI Creatinine Equation (2021)   Calcium 10/18/2020 9.2  8.4 - 10.5 mg/dL Final   Direct LDL 32/95/188405/05/2021 96.0  mg/dL Final   Optimal:  <166<100 mg/dLNear or Above Optimal:  100-129 mg/dLBorderline High:  130-159 mg/dLHigh:  160-189 mg/dLVery High:  >190 mg/dL    Physical Examination:  BP (!) 160/82   Pulse 90   Ht 5' 2.5" (1.588  m)   Wt 170 lb 12.8 oz (77.5 kg)   SpO2 98%   BMI 30.74 kg/m      ASSESSMENT:  Diabetes type 2   See history of present illness for detailed discussion of current diabetes management, blood sugar patterns and problems identified  Her A1c is 7.3, again  Blood sugars are still about the  same overall  Although her A1c is reasonably good her blood sugars are consistently high in the mornings  She has not checked readings after lunch or dinner and not clear if she needs to adjust her mealtime doses at those times  He has to last visit Likely she has done little better with her diet although still can do better because of her weight gain As before she cannot afford brand-name medications and is on basal bolus insulin, Metformin and glipizide  With increasing her insulin doses slightly blood sugars are also relatively better although recently not checking enough Fasting glucose appears to be higher now   HYPERTENSION: Blood pressure is persistently high, managed by PCP Likely needs her medication regimen increased however today she says that she is feeling stressed and does not want to change her dose She still has not made a follow-up appointment  Hypothyroidism: Her TSH was normal in May and will check on the next visit again  PLAN:    She will try to cut back on any high fat or high sugar snacks at bedtime Discussed needing to check blood sugars more often after lunch and dinner If her sugars are higher after dinner she will increase her Humalog to 6 units Go up to 18 units on Lantus and may need to go to 20 units if morning sugars are still over 150  Discussed in detail the use of freestyle libre sensor and how this would benefit her especially with her inadequate glucose monitoring at home.  She was given patient information brochure and list of DME suppliers to call to start the prescription process  To start regular exercise either walking or exercise bike She was given  specific instructions on how to adjust her insulin  She should also discuss grief management with her PCP  She can have her labs done at the PCP office before her next visit, we will order these   There are no Patient Instructions on file for this visit.      Reather Littler 01/28/2021, 1:17 PM   Note: This office note was prepared with Dragon voice recognition system technology. Any transcriptional errors that result from this process are unintentional.

## 2021-01-28 NOTE — Patient Instructions (Signed)
Start checking blood sugars around 8 PM at least 3 times a week  If blood sugar is OVER 180 after supper increase the Humalog to 6 units instead of 4 units at suppertime Make sure you take the Humalog before starting to eat  Increase the LANTUS insulin to 18 units and if after 1 week your MORNING sugar is still over 150 go up to 20 units  Check with the DME supplier about the freestyle libre continuous glucose monitoring system  Start walking daily for exercise  Schedule your labs at Dr. Lucretia Roers office a couple of days before you come for your next visit  Schedule appointment with Dr. Milinda Antis for blood pressure management

## 2021-02-04 ENCOUNTER — Encounter: Payer: Self-pay | Admitting: Family Medicine

## 2021-02-04 ENCOUNTER — Ambulatory Visit (INDEPENDENT_AMBULATORY_CARE_PROVIDER_SITE_OTHER): Payer: Medicare Other | Admitting: Family Medicine

## 2021-02-04 ENCOUNTER — Other Ambulatory Visit: Payer: Self-pay

## 2021-02-04 VITALS — BP 136/76 | HR 101 | Temp 96.9°F | Ht 62.5 in | Wt 168.2 lb

## 2021-02-04 DIAGNOSIS — F4321 Adjustment disorder with depressed mood: Secondary | ICD-10-CM | POA: Insufficient documentation

## 2021-02-04 DIAGNOSIS — E11649 Type 2 diabetes mellitus with hypoglycemia without coma: Secondary | ICD-10-CM | POA: Diagnosis not present

## 2021-02-04 DIAGNOSIS — Z8241 Family history of sudden cardiac death: Secondary | ICD-10-CM | POA: Insufficient documentation

## 2021-02-04 DIAGNOSIS — R221 Localized swelling, mass and lump, neck: Secondary | ICD-10-CM

## 2021-02-04 DIAGNOSIS — J0100 Acute maxillary sinusitis, unspecified: Secondary | ICD-10-CM

## 2021-02-04 MED ORDER — DOXYCYCLINE HYCLATE 100 MG PO TABS: 100.0000 mg | ORAL_TABLET | Freq: Two times a day (BID) | ORAL | 0 refills | Status: DC

## 2021-02-04 NOTE — Assessment & Plan Note (Signed)
Son died of MI at 74 - sudden

## 2021-02-04 NOTE — Assessment & Plan Note (Signed)
Sees Dr Lucianne Muss  Plans to get labs here when he orders them

## 2021-02-04 NOTE — Assessment & Plan Note (Signed)
Lost son unexpectedly (she lived with) Disc mood and grief Good support and good coping mechanisms Declines counseling for now -but will call if needed Enc good self care

## 2021-02-04 NOTE — Progress Notes (Signed)
Subjective:    Patient ID: Amanda King, female    DOB: Nov 27, 1940, 80 y.o.   MRN: [id number]  This visit occurred during the SARS-CoV-2 public health emergency.  Safety protocols were in place, including screening questions prior to the visit, additional usage of staff PPE, and extensive cleaning of exam room while observing appropriate contact time as indicated for disinfecting solutions.   HPI Pt presents for knot on neck  Wt Readings from Last 3 Encounters:  02/13/21 168 lb 4 oz (76.3 kg)  01/28/21 170 lb 12.8 oz (77.5 kg)  11/27/20 170 lb 6.4 oz (77.3 kg)   30.28 kg/m  Knot in her neck = left side  Has been there for a while /bigger perhaps  Shoulder and neck bother her on that side /thinks it is arthritis  Has been lifting some small weights  She takes a tylenol at bedtime    Son died of MI in 06/27/22 , she found him in the bathroom  81 years old  He lived with her  Grief has been very hard/depressed at time  His birthday was recent  She did not want counseling since she has a lot of support  Lots of family around  [place] great grand child often-keeps her busy   Daughter /son Caryn Section are close (they are buying 9 of her 10 acres to help her out)  This will help financially and with up keep  That helps a lot  Lives alone with her dog now, doing ok    Has had a posterior cervical fusion in the past  Sinus trouble  R side-frontal Bloody mucous  Very congested    Patient Active Problem List   Diagnosis Date Noted   Grief reaction 02-13-2021   Family history of sudden cardiac death February 13, 2021   Localized swelling, mass and lump, neck 2021/02/13   History of ischemic colitis    Constipation    Acute sinusitis 08/25/2016   Routine general medical examination at a health care facility 01/31/2016   Narcolepsy with cataplexy 10/31/2014   Stress reaction 03/28/2014   Encounter for Medicare annual wellness exam 09/22/2013   Hypersomnia, persistent 07/15/2013    Colon cancer screening 12/31/2011   Obesity (BMI 30-39.9) 07/01/2011   ANXIETY 05/06/2010   DERMATOPHYTOSIS OF NAIL 12/21/2008   Hypothyroidism 09/02/2006   Diabetes type 2, uncontrolled (HCC) 09/02/2006   Hyperlipidemia associated with type 2 diabetes mellitus (HCC) 09/02/2006   NARCOLEPSY W/CATAPLEXY 09/02/2006   Essential hypertension 09/02/2006   PNEUMONIA, HX OF 09/02/2006   Past Medical History:  Diagnosis Date   Diabetes mellitus    Type II   Hyperlipidemia    Hypertension    Hypothyroidism    Migraine    Narcolepsy with cataplexy    Pneumonia 08/2001   Past Surgical History:  Procedure Laterality Date   APPENDECTOMY  age 8   BIOPSY  05/24/2018   Procedure: BIOPSY;  Surgeon: Lemar Lofty., MD;  Location: [facility] ENDOSCOPY;  Service: Gastroenterology;;   COLONOSCOPY N/A 05/24/2018   Procedure: COLONOSCOPY;  Surgeon: Lemar Lofty., MD;  Location: [facility] ENDOSCOPY;  Service: Gastroenterology;  Laterality: N/A;   hemilaminectomy     C5-6; C6-7   PARTIAL HYSTERECTOMY  age 61   POSTERIOR CERVICAL FUSION/FORAMINOTOMY  14-Feb-1975   C5-C6-C7   Social History   Tobacco Use   Smoking status: Former    Types: Cigarettes    Quit date: 06/09/1980    Years since quitting: 40.6   Smokeless tobacco: Never  Vaping Use   Vaping Use: Never used  Substance Use Topics   Alcohol use: No    Alcohol/week: 0.0 standard drinks   Drug use: No   Family History  Problem Relation Age of Onset   Heart disease Mother        MI   Peripheral vascular disease Mother    Hyperlipidemia Mother    Depression Brother        suicide   Diabetes Brother    Diabetes Brother    Sleep apnea Brother    Sleep apnea Son    Diabetes Son    Heart attack Son    Sudden Cardiac Death Son    Allergies  Allergen Reactions   Penicillins Other (See Comments)    Has patient had a PCN reaction causing immediate rash, facial/tongue/throat swelling, SOB or lightheadedness with hypotension:  Unknown Has patient had a PCN reaction causing severe rash involving mucus membranes or skin necrosis: Unknown Has patient had a PCN reaction that required hospitalization: Unknown Has patient had a PCN reaction occurring within the last 10 years: No If all of the above answers are "NO", then may proceed with Cephalosporin use.    Current Outpatient Medications on File Prior to Visit  Medication Sig Dispense Refill   cetirizine (ZYRTEC) 10 MG tablet Take 10 mg by mouth daily as needed for allergies or rhinitis.      EUTHYROX 75 MCG tablet TAKE 1 TABLET BY MOUTH ONCE DAILY BEFORE BREAKFAST 30 tablet 5   fenofibrate (TRICOR) 145 MG tablet Take 1 tablet (145 mg total) by mouth daily. 90 tablet 1   glipiZIDE (GLUCOTROL XL) 10 MG 24 hr tablet Take 1 tablet by mouth once daily with breakfast 90 tablet 1   glucose blood test strip Use onetouch verio test strip as instructed to check blood sugar once daily. 100 each 1   HUMALOG KWIKPEN 100 UNIT/ML KwikPen INJECT 4 UNITS SUBCUTANEOUSLY TWICE DAILY BEFORE MEAL(S) 15 mL 0   hydrochlorothiazide (HYDRODIURIL) 25 MG tablet Take 1/2 (one-half) tablet by mouth once daily 45 tablet 3   imipramine (TOFRANIL) 50 MG tablet TAKE 1 TABLET BY MOUTH AT BEDTIME 30 tablet 3   insulin glargine (LANTUS SOLOSTAR) 100 UNIT/ML Solostar Pen INJECT 16 UNITS SUBCUTANEOUSLY ONCE DAILY AND  INCREASE  AS  DIRECTED 15 mL 2   Insulin Pen Needle (B-D UF III MINI PEN NEEDLES) 31G X 5 MM MISC USE THREE PEN NEEDLES PER DAY TO INJECT INSULIN 100 each 3   lisinopril (ZESTRIL) 20 MG tablet Take 1 tablet (20 mg total) by mouth daily. 90 tablet 3   metFORMIN (GLUCOPHAGE-XR) 750 MG 24 hr tablet Take 1 tablet (750 mg total) by mouth 2 (two) times daily. 180 tablet 3   methylphenidate (RITALIN) 20 MG tablet Take 0.5 tablets (10 mg total) by mouth 3 (three) times daily with meals. 135 tablet 0   OneTouch Delica Lancets 30G MISC USE 1  TO CHECK GLUCOSE ONCE DAILY 100 each 0   polyethylene glycol  (MIRALAX / GLYCOLAX) packet Take 17 g by mouth daily as needed. 14 each 0   Probiotic Product (ALIGN) 4 MG CAPS Take by mouth daily.     simvastatin (ZOCOR) 80 MG tablet Take 1 tablet (80 mg total) by mouth at bedtime. 90 tablet 3   No current facility-administered medications on file prior to visit.     Review of Systems  Constitutional:  Negative for activity change, appetite change, fatigue, fever and unexpected weight change.  HENT:  Positive for congestion, postnasal drip and sinus pain. Negative for ear pain, facial swelling, rhinorrhea, sinus pressure, sore throat and voice change.   Eyes:  Negative for pain, redness and visual disturbance.  Respiratory:  Negative for cough, shortness of breath and wheezing.   Cardiovascular:  Negative for chest pain and palpitations.  Gastrointestinal:  Negative for abdominal pain, blood in stool, constipation and diarrhea.  Endocrine: Negative for polydipsia and polyuria.  Genitourinary:  Negative for dysuria, frequency and urgency.  Musculoskeletal:  Negative for arthralgias, back pain and myalgias.  Skin:  Negative for pallor and rash.  Allergic/Immunologic: Negative for environmental allergies.  Neurological:  Positive for headaches. Negative for dizziness and syncope.  Hematological:  Negative for adenopathy. Does not bruise/bleed easily.  Psychiatric/Behavioral:  Negative for decreased concentration and dysphoric mood. The patient is not nervous/anxious.        Grief reaction with sadness      Objective:   Physical Exam Constitutional:      General: She is not in acute distress.    Appearance: Normal appearance. She is well-developed. She is obese. She is not ill-appearing.  HENT:     Head: Normocephalic and atraumatic.     Comments: frontal sinus tenderness-worse on the L     Right Ear: Tympanic membrane and external ear normal.     Left Ear: Tympanic membrane and external ear normal.     Nose: Congestion and rhinorrhea present.      Mouth/Throat:     Pharynx: Oropharynx is clear. No oropharyngeal exudate or posterior oropharyngeal erythema.     Comments: Clear pnd Eyes:     General:        Right eye: No discharge.        Left eye: No discharge.     Conjunctiva/sclera: Conjunctivae normal.     Pupils: Pupils are equal, round, and reactive to light.  Neck:     Comments: 1-2 cm oval lump L lateral neck/above clavicle  Soft and superficial  No erythema or skin change Mobile Non tender  No fluctuant  Cardiovascular:     Rate and Rhythm: Normal rate and regular rhythm.  Pulmonary:     Effort: Pulmonary effort is normal. No respiratory distress.     Breath sounds: Normal breath sounds. No wheezing or rales.     Comments: Good air exch No rales or rhonchi Musculoskeletal:     Cervical back: Normal range of motion and neck supple.     Right lower leg: No edema.     Left lower leg: No edema.  Lymphadenopathy:     Cervical: No cervical adenopathy.  Skin:    General: Skin is warm and dry.     Findings: No rash.  Neurological:     Mental Status: She is alert.     Cranial Nerves: No cranial nerve deficit.     Coordination: Coordination normal.  Psychiatric:        Mood and Affect: Mood normal.          Assessment & Plan:   Problem List Items Addressed This Visit       Cardiovascular and Mediastinum   Family history of sudden cardiac death    Son died of MI at 15 - sudden          Respiratory   Acute sinusitis    Congestion and purulent nasal d/c with blood  L frontal sinus pain and tenderness pcn allergic  Px doxycycline bid for 7d Update if  not starting to improve in a week or if worsening  Recommend nasal saline      Relevant Medications   doxycycline (VIBRA-TABS) 100 MG tablet     Endocrine   Diabetes type 2, uncontrolled (HCC)    Sees Dr Lucianne Muss  Plans to get labs here when he orders them        Other   Grief reaction - Primary    Lost son unexpectedly (she lived with) Disc mood  and grief Good support and good coping mechanisms Declines counseling for now -but will call if needed Enc good self care       Localized swelling, mass and lump, neck    Superficial soft mas 1-2 cm oval over R clavicle/proximal neck  Suspect seb cyst vs lipoma No tenderness or signs of infection  Ref made to derm for eval/tx       Relevant Orders   Ambulatory referral to Dermatology

## 2021-02-04 NOTE — Assessment & Plan Note (Signed)
Superficial soft mas 1-2 cm oval over R clavicle/proximal neck  Suspect seb cyst vs lipoma No tenderness or signs of infection  Ref made to derm for eval/tx

## 2021-02-04 NOTE — Assessment & Plan Note (Signed)
Congestion and purulent nasal d/c with blood  L frontal sinus pain and tenderness pcn allergic  Px doxycycline bid for 7d Update if not starting to improve in a week or if worsening  Recommend nasal saline

## 2021-02-04 NOTE — Patient Instructions (Addendum)
Have Dr Lucianne Muss go in and pt the lab orders in  I will schedule the lab appointment here   I will refer you to dermatology for the lump on your neck It feels like a cyst  If it starts to hurt or enlarge or turn red, let us know  You will get a call about dermatology    Take doxycycline for a sinus infection  Nasal saline spray may help also  If no improvement let me know

## 2021-02-12 ENCOUNTER — Other Ambulatory Visit: Payer: Self-pay | Admitting: Endocrinology

## 2021-02-20 ENCOUNTER — Other Ambulatory Visit: Payer: Self-pay | Admitting: Endocrinology

## 2021-02-26 ENCOUNTER — Telehealth: Payer: Self-pay | Admitting: Endocrinology

## 2021-02-26 NOTE — Telephone Encounter (Signed)
Pt calling in voiced that she needs the orders put in or released  for her to have her blood work done at her pcp office so Dr.Towers office can see them before her app on 04/08/2021. If any question patient states that someone can call her back.

## 2021-03-04 ENCOUNTER — Other Ambulatory Visit: Payer: Self-pay | Admitting: Endocrinology

## 2021-03-11 NOTE — Telephone Encounter (Signed)
See Endo's note. Please call pt and schedule a lab appt either at [facility] or [facility] (pt's choice) to do endo's labs (orders are in Epic)

## 2021-03-11 NOTE — Telephone Encounter (Signed)
Called ms. Bartley and she stated that she is going to get her labs done at Dr. Lucianne Muss office.

## 2021-03-11 NOTE — Telephone Encounter (Signed)
Unsure if she is on the lab schedule -let me know if I need to do anything

## 2021-03-13 ENCOUNTER — Other Ambulatory Visit: Payer: Self-pay | Admitting: Endocrinology

## 2021-03-13 ENCOUNTER — Telehealth: Payer: Self-pay | Admitting: Family Medicine

## 2021-03-13 NOTE — Telephone Encounter (Signed)
It looks like a referral was done in august- ? Same problem or different? Did she end up going? Thanks

## 2021-03-13 NOTE — Telephone Encounter (Signed)
error 

## 2021-03-13 NOTE — Telephone Encounter (Signed)
Pt called stating that she needs a recommendation for a dermatologist. Pt states in [place] or [place] would be fine.

## 2021-03-14 NOTE — Telephone Encounter (Signed)
Tried calling the patient, she did not answer. LVM for her to call back.  

## 2021-03-15 NOTE — Telephone Encounter (Signed)
Pt returning call

## 2021-03-19 NOTE — Telephone Encounter (Signed)
Patient called, she has the same problem her appointment isn't till March of next year. Patient stated she didn't want to wait that long so she found another doctor within the scope of her insurance. Patient is planning on setting up appointment with them. No Further action required.

## 2021-03-27 ENCOUNTER — Other Ambulatory Visit: Payer: Self-pay | Admitting: Endocrinology

## 2021-03-28 ENCOUNTER — Other Ambulatory Visit: Payer: Self-pay | Admitting: Adult Health

## 2021-03-28 DIAGNOSIS — G47411 Narcolepsy with cataplexy: Secondary | ICD-10-CM

## 2021-03-28 NOTE — Telephone Encounter (Addendum)
Per [place] registry, last filled Methylphenidate 20 Mg Tablet #135/90 on 10/11/20. Pt up to date on appts. Refill request sent to [facility] NP.

## 2021-03-28 NOTE — Telephone Encounter (Signed)
Pt called requesting refill for methylphenidate (RITALIN) 20 MG tablet. Pharmacy [id number] - [place] ([place]), [place] - [address]

## 2021-03-28 NOTE — Addendum Note (Signed)
Addended by: Bertram Savin on: 03/28/2021 05:03 PM   Modules accepted: Orders

## 2021-04-02 MED ORDER — METHYLPHENIDATE HCL 10 MG PO TABS: 10.0000 mg | ORAL_TABLET | Freq: Three times a day (TID) | ORAL | 0 refills | Status: DC

## 2021-04-02 NOTE — Addendum Note (Signed)
Addended by: Bertram Savin on: 04/02/2021 11:56 AM   Modules accepted: Orders

## 2021-04-02 NOTE — Telephone Encounter (Signed)
Called pt and LVM (ok per DPR) asking for call back to discuss.

## 2021-04-02 NOTE — Telephone Encounter (Signed)
We discussed switching to a 10 mg tablet of Methylphenidate.  Patient also agrees to a 30-day supply. Methylphenidate 20 mg tablet d/c and new prescription written for 10 mg #90 ready for e-scribe.

## 2021-04-08 ENCOUNTER — Ambulatory Visit: Payer: Medicare Other | Admitting: Endocrinology

## 2021-04-08 ENCOUNTER — Telehealth: Payer: Self-pay | Admitting: Adult Health

## 2021-04-08 MED ORDER — METHYLPHENIDATE HCL 20 MG PO TABS: 10.0000 mg | ORAL_TABLET | Freq: Three times a day (TID) | ORAL | 0 refills | Status: DC

## 2021-04-08 NOTE — Telephone Encounter (Signed)
[place] registry showed last filled on 10/11/2020 Methylphenidate 20 Mg Tablet #135/90.

## 2021-04-08 NOTE — Telephone Encounter (Signed)
Pt called, insurance will not pay for methylphenidate (RITALIN) 10 MG tablet Can you cancel prescription for 10 mg then send in for  20 mg. Would like a call from the nurse.

## 2021-04-11 ENCOUNTER — Telehealth: Payer: Self-pay | Admitting: Adult Health

## 2021-04-11 MED ORDER — METHYLPHENIDATE HCL 20 MG PO TABS: 10.0000 mg | ORAL_TABLET | Freq: Three times a day (TID) | ORAL | 0 refills | Status: DC

## 2021-04-11 NOTE — Telephone Encounter (Signed)
Will send rx to [facility] NP. Checked registry once, still says last fill was on 10/11/20.

## 2021-04-11 NOTE — Telephone Encounter (Signed)
Pt called stating that she was informed that all of the [facility] do not have her Rx for methylphenidate (RITALIN) 20 MG tablet and that they are on backorder and do not know when they will be getting it in. Pt has been out of medication for four days and is wanting to know if it can be called in for her to the [facility] on [address]. Please advise.

## 2021-04-12 ENCOUNTER — Other Ambulatory Visit: Payer: Self-pay

## 2021-04-12 ENCOUNTER — Other Ambulatory Visit (INDEPENDENT_AMBULATORY_CARE_PROVIDER_SITE_OTHER): Payer: Medicare Other

## 2021-04-12 DIAGNOSIS — Z794 Long term (current) use of insulin: Secondary | ICD-10-CM

## 2021-04-12 DIAGNOSIS — E063 Autoimmune thyroiditis: Secondary | ICD-10-CM | POA: Diagnosis not present

## 2021-04-12 DIAGNOSIS — E1165 Type 2 diabetes mellitus with hyperglycemia: Secondary | ICD-10-CM

## 2021-04-12 LAB — COMPREHENSIVE METABOLIC PANEL
ALT: 36 U/L — ABNORMAL HIGH (ref 0–35)
AST: 26 U/L (ref 0–37)
Albumin: 4.8 g/dL (ref 3.5–5.2)
Alkaline Phosphatase: 51 U/L (ref 39–117)
BUN: 25 mg/dL — ABNORMAL HIGH (ref 6–23)
CO2: 30 mEq/L (ref 19–32)
Calcium: 9.7 mg/dL (ref 8.4–10.5)
Chloride: 99 mEq/L (ref 96–112)
Creatinine, Ser: 1.09 mg/dL (ref 0.40–1.20)
GFR: 47.67 mL/min — ABNORMAL LOW (ref >60.00)
Glucose, Bld: 81 mg/dL (ref 70–99)
Potassium: 4.3 mEq/L (ref 3.5–5.1)
Sodium: 138 mEq/L (ref 135–145)
Total Bilirubin: 0.4 mg/dL (ref 0.2–1.2)
Total Protein: 7.3 g/dL (ref 6.0–8.3)

## 2021-04-12 LAB — TSH: TSH: 5.55 u[IU]/mL — ABNORMAL HIGH (ref 0.35–5.50)

## 2021-04-12 LAB — HEMOGLOBIN A1C: Hgb A1c MFr Bld: 7.3 % — ABNORMAL HIGH (ref 4.6–6.5)

## 2021-04-12 LAB — T4, FREE: Free T4: 0.78 ng/dL (ref 0.60–1.60)

## 2021-04-16 ENCOUNTER — Encounter: Payer: Self-pay | Admitting: Endocrinology

## 2021-04-16 ENCOUNTER — Ambulatory Visit: Payer: Medicare Other | Admitting: Endocrinology

## 2021-04-16 ENCOUNTER — Other Ambulatory Visit: Payer: Self-pay

## 2021-04-16 VITALS — BP 144/82 | HR 100 | Ht 62.5 in | Wt 172.2 lb

## 2021-04-16 DIAGNOSIS — Z23 Encounter for immunization: Secondary | ICD-10-CM | POA: Diagnosis not present

## 2021-04-16 DIAGNOSIS — I10 Essential (primary) hypertension: Secondary | ICD-10-CM | POA: Diagnosis not present

## 2021-04-16 DIAGNOSIS — E1165 Type 2 diabetes mellitus with hyperglycemia: Secondary | ICD-10-CM

## 2021-04-16 DIAGNOSIS — Z794 Long term (current) use of insulin: Secondary | ICD-10-CM | POA: Diagnosis not present

## 2021-04-16 MED ORDER — LEVOTHYROXINE SODIUM 88 MCG PO TABS: 88.0000 ug | ORAL_TABLET | Freq: Every day | ORAL | 1 refills | Status: DC

## 2021-04-16 MED ORDER — EMPAGLIFLOZIN 10 MG PO TABS: 10.0000 mg | ORAL_TABLET | Freq: Every day | ORAL | 0 refills | Status: DC

## 2021-04-16 MED ORDER — EMPAGLIFLOZIN 10 MG PO TABS: 10.0000 mg | ORAL_TABLET | Freq: Every day | ORAL | 3 refills | Status: DC

## 2021-04-16 NOTE — Progress Notes (Signed)
Patient ID: Amanda King, female   DOB: 25-Apr-1941, 80 y.o.   MRN: [id number]           Reason for Appointment: Follow-up for Type 2 Diabetes  Referring PCP: Dr. Milinda Antis   History of Present Illness:          Date of diagnosis of type 2 diabetes mellitus:  2008      Background history:  She was initially treated with metformin and subsequently glipizide added Januvia was added in 12/2016 She has had variable control with A1c as high as 10.3 done in 2015 and as low as 7.3 done in April 2016 Has generally been followed by her PCP about twice a year  She was tried on [place] in 2020  Recent history:     Non-insulin hypoglycemic drugs:  glipizide ER 10 mg daily, metformin ER 750 mg twice daily  INSULIN doses: Lantus 18  units daily acs, Humalog 6 units at breakfast and supper  Her A1c is again 7.3   Current management, blood sugar patterns and problems identified: She did bring her monitor for download As before she is checking her blood sugars mostly in the mornings or at lunchtime  She did increase her Lantus by 2 units before bedtime fasting readings are only occasionally within target  She will sometimes have snacks late at night  Also likely is having difficulties with stress and not managing her meals well  She does not do any walking for exercise Weight is recently slightly higher Previously had been tried on Jardiance but she think this was too expensive She does think that she is taking her Humalog consistently at mealtimes, generally not eating lunch   Dinner 6 p.m. usually       Side effects from medications have been: Diarrhea from high dose metformin            Glucose monitoring:  done less than 1 times a day         Glucometer:  One Touch       Blood Glucose readings from meter by by time of day    PRE-MEAL Fasting Lunch Dinner Bedtime Overall  Glucose range: 116-188 96-184   96-188  Mean/median: 159 145   155   POST-MEAL PC Breakfast PC Lunch PC  Dinner  Glucose range:     Mean/median:     ' Previously:  PRE-MEAL Fasting Lunch Dinner Bedtime Overall  Glucose range: 155-183    121-209  Mean/median: 165    161   POST-MEAL PC Breakfast PC Lunch PC Dinner  Glucose range: 121-209    Mean/median:           Dietician visit, most recent: 08/12/2018  Weight history:  Wt Readings from Last 3 Encounters:  04/16/21 172 lb 3.2 oz (78.1 kg)  02/04/21 168 lb 4 oz (76.3 kg)  01/28/21 170 lb 12.8 oz (77.5 kg)    Glycemic control:   Lab Results  Component Value Date   HGBA1C 7.3 (H) 04/12/2021   HGBA1C 7.3 (A) 10/18/2020   HGBA1C 6.7 (A) 04/06/2020   Lab Results  Component Value Date   MICROALBUR <0.7 10/18/2020   LDLCALC 61 12/27/2019   CREATININE 1.09 04/12/2021   Lab Results  Component Value Date   MICRALBCREAT 1.0 10/18/2020    Lab Results  Component Value Date   FRUCTOSAMINE 267 11/22/2018   FRUCTOSAMINE 349 (H) 08/18/2018    Lab on 04/12/2021  Component Date Value Ref Range Status   Free  T4 04/12/2021 0.78  0.60 - 1.60 ng/dL Final   Comment: Specimens from patients who are undergoing biotin therapy and /or ingesting biotin supplements may contain high levels of biotin.  The higher biotin concentration in these specimens interferes with this Free T4 assay.  Specimens that contain high levels  of biotin may cause false high results for this Free T4 assay.  Please interpret results in light of the total clinical presentation of the patient.     TSH 04/12/2021 5.55 (A)  0.35 - 5.50 uIU/mL Final   Sodium 04/12/2021 138  135 - 145 mEq/L Final   Potassium 04/12/2021 4.3  3.5 - 5.1 mEq/L Final   Chloride 04/12/2021 99  96 - 112 mEq/L Final   CO2 04/12/2021 30  19 - 32 mEq/L Final   Glucose, Bld 04/12/2021 81  70 - 99 mg/dL Final   BUN 96/09/5407 25 (A)  6 - 23 mg/dL Final   Creatinine, Ser 04/12/2021 1.09  0.40 - 1.20 mg/dL Final   Total Bilirubin 04/12/2021 0.4  0.2 - 1.2 mg/dL Final   Alkaline Phosphatase  04/12/2021 51  39 - 117 U/L Final   AST 04/12/2021 26  0 - 37 U/L Final   ALT 04/12/2021 36 (A)  0 - 35 U/L Final   Total Protein 04/12/2021 7.3  6.0 - 8.3 g/dL Final   Albumin 81/19/1478 4.8  3.5 - 5.2 g/dL Final   GFR 29/56/2130 47.67 (A)  >60.00 mL/min Final   Calculated using the CKD-EPI Creatinine Equation (2021)   Calcium 04/12/2021 9.7  8.4 - 10.5 mg/dL Final   Hgb Q6V MFr Bld 04/12/2021 7.3 (A)  4.6 - 6.5 % Final   Glycemic Control Guidelines for People with Diabetes:Non Diabetic:  <6%Goal of Therapy: <7%Additional Action Suggested:  >8%     Allergies as of 04/16/2021       Reactions   Penicillins Other (See Comments)   Has patient had a PCN reaction causing immediate rash, facial/tongue/throat swelling, SOB or lightheadedness with hypotension: Unknown Has patient had a PCN reaction causing severe rash involving mucus membranes or skin necrosis: Unknown Has patient had a PCN reaction that required hospitalization: Unknown Has patient had a PCN reaction occurring within the last 10 years: No If all of the above answers are "NO", then may proceed with Cephalosporin use.        Medication List        Accurate as of April 16, 2021  9:59 AM. If you have any questions, ask your nurse or doctor.          Align 4 MG Caps Take by mouth daily.   B-D UF III MINI PEN NEEDLES 31G X 5 MM Misc Generic drug: Insulin Pen Needle USE THREE PEN NEEDLES DAILY TO INJECT INSULIN   cetirizine 10 MG tablet Commonly known as: ZYRTEC Take 10 mg by mouth daily as needed for allergies or rhinitis.   doxycycline 100 MG tablet Commonly known as: VIBRA-TABS Take 1 tablet (100 mg total) by mouth 2 (two) times daily.   empagliflozin 10 MG Tabs tablet Commonly known as: Jardiance Take 1 tablet (10 mg total) by mouth daily with breakfast. Started by: Reather Littler, MD   Euthyrox 75 MCG tablet Generic drug: levothyroxine TAKE 1 TABLET BY MOUTH ONCE DAILY BEFORE BREAKFAST   fenofibrate 145  MG tablet Commonly known as: TRICOR Take 1 tablet (145 mg total) by mouth daily.   glipiZIDE 10 MG 24 hr tablet Commonly known as: GLUCOTROL XL  Take 1 tablet by mouth once daily with breakfast   HumaLOG KwikPen 100 UNIT/ML KwikPen Generic drug: insulin lispro INJECT 4 UNITS SUBCUTANEOUSLY TWICE DAILY BEFORE MEAL(S)   hydrochlorothiazide 25 MG tablet Commonly known as: HYDRODIURIL Take 1/2 (one-half) tablet by mouth once daily   imipramine 50 MG tablet Commonly known as: TOFRANIL TAKE 1 TABLET BY MOUTH AT BEDTIME   Lantus SoloStar 100 UNIT/ML Solostar Pen Generic drug: insulin glargine INJECT 16 UNITS SUBCUTANEOUSLY ONCE DAILY AND  INCREASE  AS  DIRECTED   lisinopril 20 MG tablet Commonly known as: ZESTRIL Take 1 tablet (20 mg total) by mouth daily.   metFORMIN 750 MG 24 hr tablet Commonly known as: GLUCOPHAGE-XR Take 1 tablet (750 mg total) by mouth 2 (two) times daily.   methylphenidate 20 MG tablet Commonly known as: RITALIN Take 0.5 tablets (10 mg total) by mouth 3 (three) times daily with meals.   OneTouch Delica Lancets 30G Misc USE 1  TO CHECK GLUCOSE ONCE DAILY   OneTouch Verio test strip Generic drug: glucose blood USE ONE TOUCH VERIO TEST STRIP AS INSTRUCTED TO CHECK BLOOD SUGAR ONCE DAILY   polyethylene glycol 17 g packet Commonly known as: MIRALAX / GLYCOLAX Take 17 g by mouth daily as needed.   simvastatin 80 MG tablet Commonly known as: ZOCOR Take 1 tablet (80 mg total) by mouth at bedtime.        Allergies:  Allergies  Allergen Reactions   Penicillins Other (See Comments)    Has patient had a PCN reaction causing immediate rash, facial/tongue/throat swelling, SOB or lightheadedness with hypotension: Unknown Has patient had a PCN reaction causing severe rash involving mucus membranes or skin necrosis: Unknown Has patient had a PCN reaction that required hospitalization: Unknown Has patient had a PCN reaction occurring within the last 10  years: No If all of the above answers are "NO", then may proceed with Cephalosporin use.     Past Medical History:  Diagnosis Date   Diabetes mellitus    Type II   Hyperlipidemia    Hypertension    Hypothyroidism    Migraine    Narcolepsy with cataplexy    Pneumonia 08/2001    Past Surgical History:  Procedure Laterality Date   APPENDECTOMY  age 67   BIOPSY  05/24/2018   Procedure: BIOPSY;  Surgeon: Lemar Lofty., MD;  Location: [facility] ENDOSCOPY;  Service: Gastroenterology;;   COLONOSCOPY N/A 05/24/2018   Procedure: COLONOSCOPY;  Surgeon: Lemar Lofty., MD;  Location: [facility] ENDOSCOPY;  Service: Gastroenterology;  Laterality: N/A;   hemilaminectomy     C5-6; C6-7   PARTIAL HYSTERECTOMY  age 85   POSTERIOR CERVICAL FUSION/FORAMINOTOMY  02/05/1975   C5-C6-C7    Family History  Problem Relation Age of Onset   Heart disease Mother        MI   Peripheral vascular disease Mother    Hyperlipidemia Mother    Depression Brother        suicide   Diabetes Brother    Diabetes Brother    Sleep apnea Brother    Sleep apnea Son    Diabetes Son    Heart attack Son    Sudden Cardiac Death Son     Social History:  reports that she quit smoking about 40 years ago. Her smoking use included cigarettes. She has never used smokeless tobacco. She reports that she does not drink alcohol and does not use drugs.   Review of Systems   Lipid history: Has been  treated by her PCP with simvastatin 80 mg Has had persistently high triglycerides   Lab Results  Component Value Date   CHOL 182 10/18/2020   HDL 47.80 10/18/2020   LDLCALC 61 12/27/2019   LDLDIRECT 96.0 10/18/2020   TRIG 236.0 (H) 10/18/2020   CHOLHDL 4 10/18/2020           Hypertension: Has been treated with HCTZ, 12.5 mg lisinopril 20 mg by her PCP Blood pressure is still high, was higher on the first measurement today  BP Readings from Last 3 Encounters:  04/16/21 (!) 144/82  02/04/21 136/76  01/28/21 (!)  160/82    Most recent eye exam was in 8/20  Most recent foot exam: 7/21   Hypothyroidism: Taking 75 mcg of Euthyrox and last TSH:     Lab Results  Component Value Date   TSH 5.55 (H) 04/12/2021   TSH 2.42 10/18/2020   TSH 4.82 (H) 05/17/2020   FREET4 0.78 04/12/2021   FREET4 0.85 10/18/2020   FREET4 0.95 12/27/2019      LABS:  Lab on 04/12/2021  Component Date Value Ref Range Status   Free T4 04/12/2021 0.78  0.60 - 1.60 ng/dL Final   Comment: Specimens from patients who are undergoing biotin therapy and /or ingesting biotin supplements may contain high levels of biotin.  The higher biotin concentration in these specimens interferes with this Free T4 assay.  Specimens that contain high levels  of biotin may cause false high results for this Free T4 assay.  Please interpret results in light of the total clinical presentation of the patient.     TSH 04/12/2021 5.55 (A)  0.35 - 5.50 uIU/mL Final   Sodium 04/12/2021 138  135 - 145 mEq/L Final   Potassium 04/12/2021 4.3  3.5 - 5.1 mEq/L Final   Chloride 04/12/2021 99  96 - 112 mEq/L Final   CO2 04/12/2021 30  19 - 32 mEq/L Final   Glucose, Bld 04/12/2021 81  70 - 99 mg/dL Final   BUN 26/71/2458 25 (A)  6 - 23 mg/dL Final   Creatinine, Ser 04/12/2021 1.09  0.40 - 1.20 mg/dL Final   Total Bilirubin 04/12/2021 0.4  0.2 - 1.2 mg/dL Final   Alkaline Phosphatase 04/12/2021 51  39 - 117 U/L Final   AST 04/12/2021 26  0 - 37 U/L Final   ALT 04/12/2021 36 (A)  0 - 35 U/L Final   Total Protein 04/12/2021 7.3  6.0 - 8.3 g/dL Final   Albumin 09/98/3382 4.8  3.5 - 5.2 g/dL Final   GFR 50/53/9767 47.67 (A)  >60.00 mL/min Final   Calculated using the CKD-EPI Creatinine Equation (2021)   Calcium 04/12/2021 9.7  8.4 - 10.5 mg/dL Final   Hgb H4L MFr Bld 04/12/2021 7.3 (A)  4.6 - 6.5 % Final   Glycemic Control Guidelines for People with Diabetes:Non Diabetic:  <6%Goal of Therapy: <7%Additional Action Suggested:  >8%     Physical  Examination:  BP (!) 144/82 (Patient Position: Standing)   Pulse 100   Ht 5' 2.5" (1.588 m)   Wt 172 lb 3.2 oz (78.1 kg)   SpO2 98%   BMI 30.99 kg/m      ASSESSMENT:  Diabetes type 2   See history of present illness for detailed discussion of current diabetes management, blood sugar patterns and problems identified  Her A1c is 7.3, again  Blood sugars are still about the same overall  Although her A1c is reasonably good her blood sugars  are consistently high in the mornings  She has not checked readings after lunch or dinner and not clear if she needs to adjust her mealtime doses at those times  He has to last visit Likely she has done little better with her diet although still can do better because of her weight gain As before she cannot afford brand-name medications and is on basal bolus insulin, Metformin and glipizide  With increasing her insulin doses slightly blood sugars are also relatively better although recently not checking enough Fasting glucose appears to be higher now   HYPERTENSION: Blood pressure is again high, managed by PCP She thinks she is stressed but has not checked her blood pressure at home May benefit from [place] as above  Hypothyroidism: Her TSH is a high again although she may be asymptomatic Since she is needing another refill now we will go up to 88 mcg on her levothyroxine instead of 75   PLAN:    She will try Jardiance again and see if she can afford it at this time  Discussed action of SGLT 2 drugs on lowering glucose by decreasing kidney absorption of glucose, benefits of weight loss and lower blood pressure, possible side effects including candidiasis and dosage regimen   Likely needs less insulin with the Jardiance and can reduce Humalog to 4 units instead of 6 6 units May also need to reduce Lantus but since fasting readings are running high we will continue the same for now  She will watch her blood pressure and if below 120 will  consider stopping HCTZ Instructions given for increasing fluid intake She will start walking regularly outside, currently does need to lose weight with her BMI about 31  Discussed blood pressure targets and she will check regularly at home  Encouraged her to check blood sugars by rotation after lunch or dinner also instead of before meals  New prescription for levothyroxine sent, may not be able to get Euthyrox now since this is not stocked at [facility]   Patient Instructions  Change GLIPIZIDE to Jardiance 10 mg daily in the morning This will help your blood sugars, lower the blood pressure and help with weight loss Reduce HUMALOG to 4 units instead of 6 units for now  Make sure you increase your fluid intake with this May cause vaginal yeast infections, make sure you have good hygiene.  If your blood pressure comes down below 120 may need to stop hydrochlorothiazide  Periodically check blood sugars AFTER dinner also  Walk daily   Flu vaccine given     Reather Littler 04/16/2021, 9:59 AM   Note: This office note was prepared with Dragon voice recognition system technology. Any transcriptional errors that result from this process are unintentional.

## 2021-04-16 NOTE — Patient Instructions (Signed)
Change GLIPIZIDE to Jardiance 10 mg daily in the morning This will help your blood sugars, lower the blood pressure and help with weight loss Reduce HUMALOG to 4 units instead of 6 units for now  Make sure you increase your fluid intake with this May cause vaginal yeast infections, make sure you have good hygiene.  If your blood pressure comes down below 120 may need to stop hydrochlorothiazide  Periodically check blood sugars AFTER dinner also  Walk daily

## 2021-04-23 ENCOUNTER — Other Ambulatory Visit: Payer: Self-pay | Admitting: Endocrinology

## 2021-04-23 DIAGNOSIS — E11649 Type 2 diabetes mellitus with hypoglycemia without coma: Secondary | ICD-10-CM

## 2021-04-26 ENCOUNTER — Other Ambulatory Visit: Payer: Self-pay | Admitting: Endocrinology

## 2021-05-16 ENCOUNTER — Other Ambulatory Visit: Payer: Self-pay | Admitting: Family Medicine

## 2021-05-16 ENCOUNTER — Other Ambulatory Visit: Payer: Self-pay | Admitting: Adult Health

## 2021-05-16 DIAGNOSIS — G4710 Hypersomnia, unspecified: Secondary | ICD-10-CM

## 2021-05-16 MED ORDER — METHYLPHENIDATE HCL 20 MG PO TABS: 10.0000 mg | ORAL_TABLET | Freq: Three times a day (TID) | ORAL | 0 refills | Status: DC

## 2021-05-16 NOTE — Telephone Encounter (Signed)
[place] drug registry has been verified. Last refill was 04/11/2021 # 45 for a 30 day supply.

## 2021-05-16 NOTE — Telephone Encounter (Signed)
Pt requesting refill for methylphenidate (RITALIN) 20 MG tablet. Pharmacy [id number] - [place], [place] - [address] AT [facility] OF [address]

## 2021-05-20 MED ORDER — METHYLPHENIDATE HCL 20 MG PO TABS: 10.0000 mg | ORAL_TABLET | Freq: Three times a day (TID) | ORAL | 0 refills | Status: DC

## 2021-05-20 NOTE — Telephone Encounter (Signed)
Pt has called to report that the methylphenidate (RITALIN) 20 MG tablet was supposed to have been called into the [facility] #[id number]  because [facility] is out of the medication.  Pt is currently at the [facility] and would like to be able to pick this up the evening  since she is already there.

## 2021-05-20 NOTE — Addendum Note (Signed)
Addended by: Guy Begin on: 05/20/2021 04:57 PM   Modules accepted: Orders

## 2021-05-22 ENCOUNTER — Other Ambulatory Visit: Payer: Self-pay | Admitting: *Deleted

## 2021-05-22 MED ORDER — HYDROCHLOROTHIAZIDE 25 MG PO TABS: ORAL_TABLET | ORAL | 1 refills | Status: DC

## 2021-05-29 ENCOUNTER — Encounter: Payer: Self-pay | Admitting: Neurology

## 2021-05-29 ENCOUNTER — Ambulatory Visit: Payer: Medicare Other | Admitting: Neurology

## 2021-05-29 DIAGNOSIS — G47411 Narcolepsy with cataplexy: Secondary | ICD-10-CM

## 2021-05-29 DIAGNOSIS — G4710 Hypersomnia, unspecified: Secondary | ICD-10-CM | POA: Diagnosis not present

## 2021-05-29 MED ORDER — IMIPRAMINE HCL 50 MG PO TABS: 50.0000 mg | ORAL_TABLET | Freq: Every day | ORAL | 3 refills | Status: DC

## 2021-05-29 MED ORDER — METHYLPHENIDATE HCL 20 MG PO TABS: 10.0000 mg | ORAL_TABLET | Freq: Three times a day (TID) | ORAL | 0 refills | Status: DC

## 2021-05-29 NOTE — Progress Notes (Signed)
[facility]  Provider:  Larey Seat, M D  Referring Provider: Abner Greenspan, MD Primary Care Physician:  Tower, Wynelle Fanny, MD  Chief Complaint  Patient presents with   Follow-up    Rm 11, alone. Here for 6 month narcolepsy / cataplexy f/u. Pt reports doing well. No issues or concerns.     HPI:  JEANIA NATER is a 80 y.o. female seen here as a revisit  from Dr. Glori Bickers for follow up of symptoms of Hypersomnia, RV on 05-29-2021.  The patient has been followed since early been 2013.  She carries a diagnosis of narcolepsy with cataplexy now after a evaluation by PSG followed by an MSLT.  For cataplexy she has remained on Tofranil or imipramine and for her sleepiness and daytime she remains on Ritalin and a generic form.  She does have a high blood pressure today 164/79 but she appears relaxed.  No  headaches and no vertigo, she does have some hearing loss.  Her BMI today is 30.5 in winter clothing.    So her weight overall has been stable.  The Epworth sleepiness score is endorsed at only 8 points which is a good resolution on Ritalin.  The fatigue severity score was not filled out, on the geriatric depression scale was endorsed for 0 out of 15 points.    The patient's comorbidities are well controlled she is on Jardiance, Humalog insulin and Lantus Solostar, Synthroid and Zestril Glucotrol, Tricor, Vibra-Tabs and Zyrtec.  Ritalin at 20 mg tablets he takes half tablet up to 3 times a day with meals, she has been on probiotics on and off and MiraLAX. Here for routine RV and refills.   Denies snoring or witnessed apneas,  she lives alone now.   Her youngest child, son Coralyn Mark, who lived with her died on August 02, 2022 after a massive heart-a attack at age 5, she has one surviving son and a daughter. Her deceased son had a febrile illness and had a DOT exam only 2 days to his death  She has a dog now  that is her baby, sleeps in her bed, is always around.  A mix of Chihuhua and  dachshund.   Larey Seat, M.D.    I had last seen Mrs. Cullen in 2013 after she was referred to me with a diagnosis of narcolepsy with cataplexy.  She had been treated on generic imipramine and Ritalin;   50  mg of imipramine at night , and 20 mg of methylphenidate 4 times daily. In  2013 she described that in spite of the medication she had a cataplectic attack related to the emotionally upsetting news  of her granddaughter's father-in-law's death.  She reported rare sleep hallucinations and had been followed by Dr. Morrell Riddle , Dr. Earley Favor  and was Dr. Tressia Danas patient until his retirement.  The patient states that the narcolepsy was diagnosed  after 1989 with cataplectic attacks.;  she was unsure of the was any viral onset and that she has no family history of narcolepsy but at the time underwent a neck surgery with myelogram and somehow  Remained always sleepier since that time.  She reported ongoing cataplectic spells whenever she tried to change dosing of her medication - she had been using Tofranil as the brand name to sleep  but was due to costs and insurance coverage forced to change to generic imipramine, which triggered additional cataplectic attacks.  We have discussed Xyrem in our last visit, but postponed the initiation  as she felt that her current regimen was sufficient. The patient initiates bedtime normally at 11 PM, she will fall asleep promptly, times a week Mondays, Wednesdays and Fridays she will rise at 7 AM to go to the gym. Spontaneously she will wake up between 7 and 8 on other days as well. During the day she often takes a nap on her couch, about 45 minutes duration that she feels is refreshing. The nocturnal sleep time is estimated to be around 8 hours as well. He can be fragmented but visit dreams but he denied any nightmares or any "acting out "of dreams. She is very emotional at funerals and had multiple cataplectic spells in that setting.  The patient just retired from  [employer]. after 24 years and is now rearranging her day-to-day life. 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 She isn't from a smoker but quit over 32 years ago and she has not used alcohol or recreational drugs. She would drink up to 3 cups of caffeinated beverages per day.   Rv 10-31-14; Mrs. Coen is here today for which he revisit and refills. She reports doing fine, he had no paralyzing spells recently no nightmares, no dream intrusion. I asked her about cataplexy and she said it right now she seems to be free of so spells. There is that the only time she experiences cataplexy now is when she is really upsets or last time she was at a funeral and grieving. She endorsed today the Epworth sleepiness score at 12 points down from 13 and the fatigue severity score at 12 points. Revisit from 12-20-14 Mrs. Sorrels has been diagnosed with narcolepsy and cataplexy she has clinically all the necessary symptoms and an HLA test was positive for the HLA he he 1. We were finally unable in May to start her on Xyrem , and she is now on TOFRANIL which she has tolerated well but she takes 1 nocturnal dose.  She takes up to 4 times a day stimulant medication her case Ritalin. Sometimes she can get by this 2 or 3 doses only. It is remarkable that she has found new employment and the energy to work now 6 days a week. Her treatment made the difference.   Interval history from 12/24/2015, Mrs. Malan reports today that she has done very well with her current medical regimen and treatment of narcolepsy. She endorsed 7 points on the Epworth sleepiness score, 9 points on the fatigue severity score and 0 on the geriatric depression score. She has decided that she cannot retire and will work 3 times a week and this keeps her active and happy. She also just told me that  her granddaughter's are 73 and 19 years old and that she has already  2 great-grandchildren 60  years old !  Interval history 22nd of January 2018, Mrs. Dicke has done very well on Ritalin and Tofranil the treatment of narcolepsy with cataplexy. She was HLA positive for narcolepsy,  she was unable to undergo an MS LT test. She lost her beloved dog just before Christmas 2017 and currently has a new puppy and that she is trying to train. She no longer visits the gym at this time but she has walked more. She loves to read in bed. She does not watch TV in  bed.   Interval history from 06 October 2017, Mrs. Nodal was last seen by nurse practitioner Megan milligrams 6 months ago.  She is here for routine refill of Ritalin and Tofranil to medications at control narcolepsy and cataplexy.  She has tolerated the medication very well, she has kept physically active she still is working part-time, she reports less frequent dreams and last visit dreams on these medications.  Her Epworth sleepiness score was reduced to 10 points and her fatigue severity is only certain points, the geriatric depression score was endorsed at 0 out of 15 points.  I will refill today Ritalin to 20 mg tablets, taken 2-3 / day.  I gave her 180 # supply .   Review of Systems: Out of a complete 14 system review, the patient complains of only the following symptoms, and all other reviewed systems are negative. The patient endorsed the fatigue severity scale at 11 , and the Epworth Sleepiness Scale at 8 points , and the geriatric depression score at 2/15 point. She has significant loss of hearing and wears hearing aids bilaterally.  Her son installed some exercise equipment in her upstairs bedroom. How likely are you to doze in the following situations: 0 = not likely, 1 = slight chance, 2 = moderate chance, 3 = high chance  Sitting and Reading? Watching Television? Sitting inactive in a public place (theater or meeting)? Lying down in the  afternoon when circumstances permit? Sitting and talking to someone? Sitting quietly after lunch without alcohol? In a car, while stopped for a few minutes in traffic? As a passenger in a car for an hour without a break?  Total = 8 points.  Caffeine- after work at night !  Tobacco- none, quit 37 years ago. 1979 ETOH , 1 beer or wine on a week end.    Social History   Socioeconomic History   Marital status: Widowed    Spouse name: Not on file   Number of children: 3   Years of education: GED   Highest education level: Not on file  Occupational History   Occupation: works 1 day a week  Tobacco Use   Smoking status: Former    Types: Cigarettes    Quit date: 06/09/1980    Years since quitting: 40.9   Smokeless tobacco: Never  Vaping Use   Vaping Use: Never used  Substance and Sexual Activity   Alcohol use: No    Alcohol/week: 0.0 standard drinks   Drug use: No   Sexual activity: Never  Other Topics Concern   Not on file  Social History Narrative   Patient is widowed. and her son lives with her.   Walks for exercise.     Patient has three children.   Patient is right-handed.   Patient drinks three cups of caffeine daily.     Patient is retired.   Patient has a GED.   Social Determinants of Health   Financial Resource Strain: Not on file  Food Insecurity: Not on file  Transportation Needs: Not on file  Physical Activity: Not on file  Stress: Not on file  Social Connections: Not on file  Intimate Partner Violence: Not on file    Family History  Problem Relation Age of Onset   Heart disease Mother        MI   Peripheral vascular disease Mother    Hyperlipidemia Mother    Depression Brother        suicide   Diabetes Brother  Diabetes Brother    Sleep apnea Brother    Sleep apnea Son    Diabetes Son    Heart attack Son    Sudden Cardiac Death Son     Past Medical History:  Diagnosis Date   Diabetes mellitus    Type II   Hyperlipidemia     Hypertension    Hypothyroidism    Migraine    Narcolepsy with cataplexy    Pneumonia 08/2001    Past Surgical History:  Procedure Laterality Date   APPENDECTOMY  age 45   BIOPSY  05/24/2018   Procedure: BIOPSY;  Surgeon: Irving Copas., MD;  Location: [facility];  Service: Gastroenterology;;   COLONOSCOPY N/A 05/24/2018   Procedure: COLONOSCOPY;  Surgeon: Irving Copas., MD;  Location: [facility];  Service: Gastroenterology;  Laterality: N/A;   hemilaminectomy     C5-6; C6-7   PARTIAL HYSTERECTOMY  age 29   POSTERIOR CERVICAL FUSION/FORAMINOTOMY  02/05/1975   C5-C6-C7    Current Outpatient Medications  Medication Sig Dispense Refill   cetirizine (ZYRTEC) 10 MG tablet Take 10 mg by mouth daily as needed for allergies or rhinitis.      doxycycline (VIBRA-TABS) 100 MG tablet Take 1 tablet (100 mg total) by mouth 2 (two) times daily. 14 tablet 0   empagliflozin (JARDIANCE) 10 MG TABS tablet Take 1 tablet (10 mg total) by mouth daily with breakfast. 30 tablet 3   fenofibrate (TRICOR) 145 MG tablet Take 1 tablet by mouth once daily 90 tablet 0   glipiZIDE (GLUCOTROL XL) 10 MG 24 hr tablet Take 1 tablet by mouth once daily with breakfast 90 tablet 0   HUMALOG KWIKPEN 100 UNIT/ML KwikPen INJECT 4 UNITS SUBCUTANEOUSLY TWICE DAILY BEFORE MEAL(S) 15 mL 2   hydrochlorothiazide (HYDRODIURIL) 25 MG tablet Take 1/2 (one-half) tablet by mouth once daily 45 tablet 1   imipramine (TOFRANIL) 50 MG tablet TAKE 1 TABLET BY MOUTH AT BEDTIME 30 tablet 3   insulin glargine (LANTUS SOLOSTAR) 100 UNIT/ML Solostar Pen INJECT 16 UNITS SUBCUTANEOUSLY ONCE DAILY AND  INCREASE  AS  DIRECTED 15 mL 2   Insulin Pen Needle (B-D UF III MINI PEN NEEDLES) 31G X 5 MM MISC THREE PEN NEEDLES DAILY TO INJECT INSULIN 100 each 0   levothyroxine (EUTHYROX) 88 MCG tablet Take 1 tablet (88 mcg total) by mouth daily before breakfast. 90 tablet 1   lisinopril (ZESTRIL) 20 MG tablet Take 1 tablet (20 mg total) by  mouth daily. 90 tablet 3   metFORMIN (GLUCOPHAGE-XR) 750 MG 24 hr tablet Take 1 tablet by mouth twice daily 180 tablet 0   methylphenidate (RITALIN) 20 MG tablet Take 0.5 tablets (10 mg total) by mouth 3 (three) times daily with meals. 90 tablet 0   OneTouch Delica Lancets 09N MISC USE 1  TO CHECK GLUCOSE ONCE DAILY 100 each 0   ONETOUCH VERIO test strip USE ONE TOUCH VERIO TEST STRIP AS INSTRUCTED TO CHECK BLOOD SUGAR ONCE DAILY 100 each 0   polyethylene glycol (MIRALAX / GLYCOLAX) packet Take 17 g by mouth daily as needed. 14 each 0   Probiotic Product (ALIGN) 4 MG CAPS Take by mouth daily.     simvastatin (ZOCOR) 80 MG tablet Take 1 tablet (80 mg total) by mouth at bedtime. 90 tablet 3   No current facility-administered medications for this visit.    Allergies as of 05/29/2021 - Review Complete 05/29/2021  Allergen Reaction Noted   Penicillins Other (See Comments) 04/02/2007  Vitals: BP (!) 164/79    Pulse (!) 102    Ht 5' 2.5" (1.588 m)    Wt 169 lb 8 oz (76.9 kg)    BMI 30.51 kg/m  Last Weight:  Wt Readings from Last 1 Encounters:  05/29/21 169 lb 8 oz (76.9 kg)   Last Height:   Ht Readings from Last 1 Encounters:  05/29/21 5' 2.5" (1.588 m)   Physical exam: stable weight for years,   General: The patient is awake, alert and appears not in acute distress. The patient is well groomed. Head: Normocephalic, atraumatic. Neck is supple. Mallampati 3 , neck circumference:15.25 . Cardiovascular:  Regular rate and rhythm, without  murmurs or carotid bruit, and without distended neck veins. Respiratory: Lungs are clear to auscultation. Skin:  Without evidence of edema,facial rash-  Trunk: BMI is elevated , normal posture.  Neurologic exam : The patient is awake and alert, oriented to place and time.  Memory subjective  described as intact.  There is a normal attention span & concentration ability.   Cranial nerves: No lossof taste or smell. Pupils are equal and briskly  reactive to light. Funduscopic exam without  evidence of pallor or edema.Extraocular movements  in vertical and horizontal planes intact and without nystagmus. Visual fields by finger perimetry are intact. Hearing to finger rub intact with hearing aids. Facial sensation intact to fine touch.  Facial motor strength is symmetric and tongue and uvula move midline. Motor exam:   Normal tone and normal muscle bulk and symmetric normal strength in all extremities.   Assessment:  After physical and neurologic examination, review of laboratory studies, imaging, neurophysiology testing and this patient carries the diagnosis of cataplexy and narcolepsy in 1984, after suffering severe cataplectic attacks.  Sleep hallucinations are rare now.  There is no longer access to the original sleep studies.  Plan:  Treatment plan and additional workup :  Patient with good resolution on Tofranil. To  have Mrs. Janeway and the diagnosis of narcolepsy with cataplexy. Ritalin in daytime , works good at current dose.    HLA testing for narcolepsy with cataplexy was positive for HLA - she has clincally all symptoms and she is doing better on Tofranil and Ritalin, but was denied XYREM,   Medicare coverage had failed for this patient.  She had once not taking the medication for 4 days and suffered from severe sleep a hallucinations, vivid and scary dreams came back. I will Refill Tofranil, for cataplexy treatment.  Refilled Ritalin, prn , but usually twice daily for hypersomnia control..   She reports less vivid dreams on these medication.    I will refill today Ritalin to 20 mg tablets, taken 2-3 / day. Tofranil ;  I gave her 90 day # 3 refill supply .  Rv I 6 months.  Larey Seat, MD     Cc; Dr. Loura Pardon

## 2021-06-03 ENCOUNTER — Other Ambulatory Visit: Payer: Self-pay | Admitting: Endocrinology

## 2021-06-18 ENCOUNTER — Other Ambulatory Visit: Payer: Self-pay

## 2021-06-18 ENCOUNTER — Encounter: Payer: Self-pay | Admitting: Endocrinology

## 2021-06-18 ENCOUNTER — Ambulatory Visit: Payer: Medicare Other | Admitting: Endocrinology

## 2021-06-18 VITALS — BP 132/76 | Ht 62.0 in | Wt 167.2 lb

## 2021-06-18 DIAGNOSIS — E063 Autoimmune thyroiditis: Secondary | ICD-10-CM | POA: Diagnosis not present

## 2021-06-18 DIAGNOSIS — I10 Essential (primary) hypertension: Secondary | ICD-10-CM | POA: Diagnosis not present

## 2021-06-18 DIAGNOSIS — E1165 Type 2 diabetes mellitus with hyperglycemia: Secondary | ICD-10-CM | POA: Diagnosis not present

## 2021-06-18 DIAGNOSIS — Z794 Long term (current) use of insulin: Secondary | ICD-10-CM

## 2021-06-18 LAB — TSH: TSH: 3.32 u[IU]/mL (ref 0.35–5.50)

## 2021-06-18 LAB — BASIC METABOLIC PANEL
BUN: 28 mg/dL — ABNORMAL HIGH (ref 6–23)
CO2: 27 mEq/L (ref 19–32)
Calcium: 9.6 mg/dL (ref 8.4–10.5)
Chloride: 97 mEq/L (ref 96–112)
Creatinine, Ser: 1.17 mg/dL (ref 0.40–1.20)
GFR: 43.73 mL/min — ABNORMAL LOW (ref >60.00)
Glucose, Bld: 128 mg/dL — ABNORMAL HIGH (ref 70–99)
Potassium: 4.5 mEq/L (ref 3.5–5.1)
Sodium: 135 mEq/L (ref 135–145)

## 2021-06-18 NOTE — Patient Instructions (Signed)
Lantus insulin 20 units daily

## 2021-06-18 NOTE — Progress Notes (Signed)
Patient ID: Amanda King, female   DOB: Jun 13, 1940, 81 y.o.   MRN: [id number]           Reason for Appointment: Follow-up for Type 2 Diabetes  Referring PCP: Dr. Milinda Antis   History of Present Illness:          Date of diagnosis of type 2 diabetes mellitus:  2008      Background history:  She was initially treated with metformin and subsequently glipizide added Januvia was added in 12/2016 She has had variable control with A1c as high as 10.3 done in 2015 and as low as 7.3 done in April 2016 Has generally been followed by her PCP about twice a year  She was tried on [place] in 2020  Recent history:     Non-insulin hypoglycemic drugs:  glipizide ER 10 mg daily, metformin ER 750 mg twice daily, Jardiance 10 mg daily  INSULIN doses: Lantus 18  units daily acs, Humalog 6 units at breakfast and supper  Her A1c is last 7.3   Current management, blood sugar patterns and problems identified: She did bring her monitor for download She is here for follow-up after starting Jardiance in November She has been able to afford this now Her blood sugars however do not appear to be reasonably well controlled She think this is from a lot of stress with family illnesses She has been eating out more including fast food Also has had more difficulty not being able to give her insulin for her evening meal before eating Blood sugar monitoring has been minimal except morning and midday She has however lost some weight No side effects like candidiasis from [employer] 6 p.m. usually       Side effects from medications have been: Diarrhea from high dose metformin            Glucose monitoring:  done less than 1 times a day         Glucometer:  One Touch       Blood Glucose readings from meter by by time of day    PRE-MEAL Fasting Lunch Dinner Bedtime Overall  Glucose range: 146-199    119-231  Mean/median: 167 155   165   POST-MEAL PC Breakfast PC Lunch PC Dinner  Glucose range:    156-231  Mean/median:   184   Previously  PRE-MEAL Fasting Lunch Dinner Bedtime Overall  Glucose range: 116-188 96-184   96-188  Mean/median: 159 145   155   POST-MEAL PC Breakfast PC Lunch PC Dinner  Glucose range:     Mean/median:     '    Dietician visit, most recent: 08/12/2018  Weight history:  Wt Readings from Last 3 Encounters:  06/18/21 167 lb 3.2 oz (75.8 kg)  05/29/21 169 lb 8 oz (76.9 kg)  04/16/21 172 lb 3.2 oz (78.1 kg)    Glycemic control:   Lab Results  Component Value Date   HGBA1C 7.3 (H) 04/12/2021   HGBA1C 7.3 (A) 10/18/2020   HGBA1C 6.7 (A) 04/06/2020   Lab Results  Component Value Date   MICROALBUR <0.7 10/18/2020   LDLCALC 61 12/27/2019   CREATININE 1.09 04/12/2021   Lab Results  Component Value Date   MICRALBCREAT 1.0 10/18/2020    Lab Results  Component Value Date   FRUCTOSAMINE 267 11/22/2018   FRUCTOSAMINE 349 (H) 08/18/2018    No visits with results within 1 Week(s) from this visit.  Latest known visit with results is:  Lab on 04/12/2021  Component Date Value Ref Range Status   Free T4 04/12/2021 0.78  0.60 - 1.60 ng/dL Final   Comment: Specimens from patients who are undergoing biotin therapy and /or ingesting biotin supplements may contain high levels of biotin.  The higher biotin concentration in these specimens interferes with this Free T4 assay.  Specimens that contain high levels  of biotin may cause false high results for this Free T4 assay.  Please interpret results in light of the total clinical presentation of the patient.     TSH 04/12/2021 5.55 (H)  0.35 - 5.50 uIU/mL Final   Sodium 04/12/2021 138  135 - 145 mEq/L Final   Potassium 04/12/2021 4.3  3.5 - 5.1 mEq/L Final   Chloride 04/12/2021 99  96 - 112 mEq/L Final   CO2 04/12/2021 30  19 - 32 mEq/L Final   Glucose, Bld 04/12/2021 81  70 - 99 mg/dL Final   BUN 56/97/9480 25 (H)  6 - 23 mg/dL Final   Creatinine, Ser 04/12/2021 1.09  0.40 - 1.20 mg/dL Final   Total  Bilirubin 04/12/2021 0.4  0.2 - 1.2 mg/dL Final   Alkaline Phosphatase 04/12/2021 51  39 - 117 U/L Final   AST 04/12/2021 26  0 - 37 U/L Final   ALT 04/12/2021 36 (H)  0 - 35 U/L Final   Total Protein 04/12/2021 7.3  6.0 - 8.3 g/dL Final   Albumin 16/55/3748 4.8  3.5 - 5.2 g/dL Final   GFR 27/12/8673 47.67 (L)  >60.00 mL/min Final   Calculated using the CKD-EPI Creatinine Equation (2021)   Calcium 04/12/2021 9.7  8.4 - 10.5 mg/dL Final   Hgb Q4B MFr Bld 04/12/2021 7.3 (H)  4.6 - 6.5 % Final   Glycemic Control Guidelines for People with Diabetes:Non Diabetic:  <6%Goal of Therapy: <7%Additional Action Suggested:  >8%     Allergies as of 06/18/2021       Reactions   Penicillins Other (See Comments)   Has patient had a PCN reaction causing immediate rash, facial/tongue/throat swelling, SOB or lightheadedness with hypotension: Unknown Has patient had a PCN reaction causing severe rash involving mucus membranes or skin necrosis: Unknown Has patient had a PCN reaction that required hospitalization: Unknown Has patient had a PCN reaction occurring within the last 10 years: No If all of the above answers are "NO", then may proceed with Cephalosporin use.        Medication List        Accurate as of June 18, 2021 11:25 AM. If you have any questions, ask your nurse or doctor.          Align 4 MG Caps Take by mouth daily.   B-D UF III MINI PEN NEEDLES 31G X 5 MM Misc Generic drug: Insulin Pen Needle USE 3 PEN NEEDEDS  DAILY TO INJECT INSULIN   cetirizine 10 MG tablet Commonly known as: ZYRTEC Take 10 mg by mouth daily as needed for allergies or rhinitis.   doxycycline 100 MG tablet Commonly known as: VIBRA-TABS Take 1 tablet (100 mg total) by mouth 2 (two) times daily.   empagliflozin 10 MG Tabs tablet Commonly known as: Jardiance Take 1 tablet (10 mg total) by mouth daily with breakfast.   fenofibrate 145 MG tablet Commonly known as: TRICOR Take 1 tablet by mouth once  daily   glipiZIDE 10 MG 24 hr tablet Commonly known as: GLUCOTROL XL Take 1 tablet by mouth once daily with breakfast   HumaLOG  KwikPen 100 UNIT/ML KwikPen Generic drug: insulin lispro INJECT 4 UNITS SUBCUTANEOUSLY TWICE DAILY BEFORE MEAL(S)   hydrochlorothiazide 25 MG tablet Commonly known as: HYDRODIURIL Take 1/2 (one-half) tablet by mouth once daily   imipramine 50 MG tablet Commonly known as: TOFRANIL Take 1 tablet (50 mg total) by mouth at bedtime.   Lantus SoloStar 100 UNIT/ML Solostar Pen Generic drug: insulin glargine INJECT 16 UNITS SUBCUTANEOUSLY ONCE DAILY AND  INCREASE  AS  DIRECTED What changed: how much to take   levothyroxine 88 MCG tablet Commonly known as: Euthyrox Take 1 tablet (88 mcg total) by mouth daily before breakfast.   lisinopril 20 MG tablet Commonly known as: ZESTRIL Take 1 tablet (20 mg total) by mouth daily.   metFORMIN 750 MG 24 hr tablet Commonly known as: GLUCOPHAGE-XR Take 1 tablet by mouth twice daily   methylphenidate 20 MG tablet Commonly known as: RITALIN Take 0.5 tablets (10 mg total) by mouth 3 (three) times daily with meals.   OneTouch Delica Lancets 30G Misc USE 1  TO CHECK GLUCOSE ONCE DAILY   OneTouch Verio test strip Generic drug: glucose blood USE ONE TOUCH VERIO TEST STRIP AS INSTRUCTED TO CHECK BLOOD SUGAR ONCE DAILY   polyethylene glycol 17 g packet Commonly known as: MIRALAX / GLYCOLAX Take 17 g by mouth daily as needed.   simvastatin 80 MG tablet Commonly known as: ZOCOR Take 1 tablet (80 mg total) by mouth at bedtime.        Allergies:  Allergies  Allergen Reactions   Penicillins Other (See Comments)    Has patient had a PCN reaction causing immediate rash, facial/tongue/throat swelling, SOB or lightheadedness with hypotension: Unknown Has patient had a PCN reaction causing severe rash involving mucus membranes or skin necrosis: Unknown Has patient had a PCN reaction that required hospitalization:  Unknown Has patient had a PCN reaction occurring within the last 10 years: No If all of the above answers are "NO", then may proceed with Cephalosporin use.     Past Medical History:  Diagnosis Date   Diabetes mellitus    Type II   Hyperlipidemia    Hypertension    Hypothyroidism    Migraine    Narcolepsy with cataplexy    Pneumonia 08/2001    Past Surgical History:  Procedure Laterality Date   APPENDECTOMY  age 10   BIOPSY  05/24/2018   Procedure: BIOPSY;  Surgeon: Lemar Lofty., MD;  Location: [facility] ENDOSCOPY;  Service: Gastroenterology;;   COLONOSCOPY N/A 05/24/2018   Procedure: COLONOSCOPY;  Surgeon: Lemar Lofty., MD;  Location: [facility] ENDOSCOPY;  Service: Gastroenterology;  Laterality: N/A;   hemilaminectomy     C5-6; C6-7   PARTIAL HYSTERECTOMY  age 21   POSTERIOR CERVICAL FUSION/FORAMINOTOMY  02/05/1975   C5-C6-C7    Family History  Problem Relation Age of Onset   Heart disease Mother        MI   Peripheral vascular disease Mother    Hyperlipidemia Mother    Depression Brother        suicide   Diabetes Brother    Diabetes Brother    Sleep apnea Brother    Sleep apnea Son    Diabetes Son    Heart attack Son    Sudden Cardiac Death Son     Social History:  reports that she quit smoking about 41 years ago. Her smoking use included cigarettes. She has never used smokeless tobacco. She reports that she does not drink alcohol and does not  use drugs.   Review of Systems   Lipid history: Has been treated by her PCP with simvastatin 80 mg Has had persistently high triglycerides   Lab Results  Component Value Date   CHOL 182 10/18/2020   HDL 47.80 10/18/2020   LDLCALC 61 12/27/2019   LDLDIRECT 96.0 10/18/2020   TRIG 236.0 (H) 10/18/2020   CHOLHDL 4 10/18/2020           Hypertension: Has been treated with HCTZ, 12.5 mg lisinopril 20 mg by her PCP Blood pressure is reportedly fairly good but not checking regularly recently Also on Jardiance,  HCTZ has not been reduced  BP Readings from Last 3 Encounters:  06/18/21 (!) 142/84  05/29/21 (!) 164/79  04/16/21 (!) 144/82    Most recent eye exam was in 8/20  Most recent foot exam: 05/2020   Hypothyroidism: Taking 88 mcg of Euthyrox since last TSH Had increased up to 5.6 on the 75 mcg dose   Lab Results  Component Value Date   TSH 5.55 (H) 04/12/2021   TSH 2.42 10/18/2020   TSH 4.82 (H) 05/17/2020   FREET4 0.78 04/12/2021   FREET4 0.85 10/18/2020   FREET4 0.95 12/27/2019      LABS:  No visits with results within 1 Week(s) from this visit.  Latest known visit with results is:  Lab on 04/12/2021  Component Date Value Ref Range Status   Free T4 04/12/2021 0.78  0.60 - 1.60 ng/dL Final   Comment: Specimens from patients who are undergoing biotin therapy and /or ingesting biotin supplements may contain high levels of biotin.  The higher biotin concentration in these specimens interferes with this Free T4 assay.  Specimens that contain high levels  of biotin may cause false high results for this Free T4 assay.  Please interpret results in light of the total clinical presentation of the patient.     TSH 04/12/2021 5.55 (H)  0.35 - 5.50 uIU/mL Final   Sodium 04/12/2021 138  135 - 145 mEq/L Final   Potassium 04/12/2021 4.3  3.5 - 5.1 mEq/L Final   Chloride 04/12/2021 99  96 - 112 mEq/L Final   CO2 04/12/2021 30  19 - 32 mEq/L Final   Glucose, Bld 04/12/2021 81  70 - 99 mg/dL Final   BUN 60/45/409811/09/2020 25 (H)  6 - 23 mg/dL Final   Creatinine, Ser 04/12/2021 1.09  0.40 - 1.20 mg/dL Final   Total Bilirubin 04/12/2021 0.4  0.2 - 1.2 mg/dL Final   Alkaline Phosphatase 04/12/2021 51  39 - 117 U/L Final   AST 04/12/2021 26  0 - 37 U/L Final   ALT 04/12/2021 36 (H)  0 - 35 U/L Final   Total Protein 04/12/2021 7.3  6.0 - 8.3 g/dL Final   Albumin 11/91/478211/09/2020 4.8  3.5 - 5.2 g/dL Final   GFR 95/62/130811/09/2020 47.67 (L)  >60.00 mL/min Final   Calculated using the CKD-EPI Creatinine Equation  (2021)   Calcium 04/12/2021 9.7  8.4 - 10.5 mg/dL Final   Hgb M5HA1c MFr Bld 04/12/2021 7.3 (H)  4.6 - 6.5 % Final   Glycemic Control Guidelines for People with Diabetes:Non Diabetic:  <6%Goal of Therapy: <7%Additional Action Suggested:  >8%     Physical Examination:  BP (!) 142/84    Ht 5\' 2"  (1.575 m)    Wt 167 lb 3.2 oz (75.8 kg)    BMI 30.58 kg/m      ASSESSMENT:  Diabetes type 2   See history of present  illness for detailed discussion of current diabetes management, blood sugar patterns and problems identified  Her A1c is 7.3, in November  Blood sugars are relatively higher despite starting Jardiance in addition This is likely to be from her inconsistent diet, not taking mealtime insulin on time and stress However her weight has come down She also has high fasting readings Because of her issues with family illnesses she has not checked her sugars also as much especially after meals No side effects from Jardiance Fasting glucose is higher than before   HYPERTENSION: Blood pressure is improved now especially on second measurement   Hypothyroidism: Her TSH needs to be rechecked with the increase levothyroxine She does have fatigue but this is likely to be from other issues  PLAN:    She will continue 10 mg Jardiance for now Increase Lantus to 20 units Hopefully with her family situation improving on the next visit may not need to adjust her doses further Encouraged her to start walking when able to She will also try to take her mealtime insulin with her to dose before the meal Check TSH   There are no Patient Instructions on file for this visit.      Reather LittlerAjay Mkenzie Dotts 06/18/2021, 11:25 AM   Note: This office note was prepared with Dragon voice recognition system technology. Any transcriptional errors that result from this process are unintentional.

## 2021-06-19 LAB — FRUCTOSAMINE: Fructosamine: 309 umol/L — ABNORMAL HIGH (ref 0–285)

## 2021-06-25 DIAGNOSIS — D225 Melanocytic nevi of trunk: Secondary | ICD-10-CM | POA: Diagnosis not present

## 2021-06-25 DIAGNOSIS — L218 Other seborrheic dermatitis: Secondary | ICD-10-CM | POA: Diagnosis not present

## 2021-06-25 DIAGNOSIS — L720 Epidermal cyst: Secondary | ICD-10-CM | POA: Diagnosis not present

## 2021-07-05 ENCOUNTER — Other Ambulatory Visit: Payer: Self-pay | Admitting: Endocrinology

## 2021-07-23 DIAGNOSIS — L720 Epidermal cyst: Secondary | ICD-10-CM | POA: Diagnosis not present

## 2021-07-24 ENCOUNTER — Other Ambulatory Visit: Payer: Self-pay | Admitting: Endocrinology

## 2021-07-24 DIAGNOSIS — E11649 Type 2 diabetes mellitus with hypoglycemia without coma: Secondary | ICD-10-CM

## 2021-07-29 ENCOUNTER — Other Ambulatory Visit: Payer: Self-pay | Admitting: Neurology

## 2021-07-29 DIAGNOSIS — G4710 Hypersomnia, unspecified: Secondary | ICD-10-CM

## 2021-07-29 MED ORDER — METHYLPHENIDATE HCL 20 MG PO TABS: 10.0000 mg | ORAL_TABLET | Freq: Three times a day (TID) | ORAL | 0 refills | Status: DC

## 2021-07-29 NOTE — Addendum Note (Signed)
Addended by: Judi Cong on: 07/29/2021 11:12 AM   Modules accepted: Orders

## 2021-07-29 NOTE — Telephone Encounter (Signed)
Pt called in stating she needs her methylphenidate (RITALIN) 20 MG tablet refilled and sent to [id number] - [place], [place] - [address] AT [facility] OF [address]

## 2021-08-02 ENCOUNTER — Other Ambulatory Visit: Payer: Self-pay | Admitting: Endocrinology

## 2021-08-06 DIAGNOSIS — L720 Epidermal cyst: Secondary | ICD-10-CM | POA: Diagnosis not present

## 2021-08-07 ENCOUNTER — Other Ambulatory Visit: Payer: Self-pay | Admitting: Endocrinology

## 2021-08-08 ENCOUNTER — Ambulatory Visit: Payer: Medicare Other | Admitting: Dermatology

## 2021-08-14 ENCOUNTER — Other Ambulatory Visit: Payer: Self-pay | Admitting: Family Medicine

## 2021-08-14 ENCOUNTER — Other Ambulatory Visit: Payer: Self-pay | Admitting: Endocrinology

## 2021-08-14 NOTE — Telephone Encounter (Signed)
Pt is overdue for her AWV/ CPE part 2, please schedule appts then route back to me to refill med  ?

## 2021-08-20 DIAGNOSIS — L720 Epidermal cyst: Secondary | ICD-10-CM | POA: Diagnosis not present

## 2021-08-29 ENCOUNTER — Telehealth: Payer: Self-pay

## 2021-08-29 NOTE — Telephone Encounter (Signed)
Called spoke with patient regarding refill for simvastatin , pt requested a refill however pt hasn't had this fill since 02/09/21 and the rx written was back in 2021, however Dr.kumar has advised pt about her dosage on this medication and also has done labs LDL,Lipid panel done on 01/23. Pt said that she has been out of her medication for 3 weeks now.  Pt hasn't seen you her since 08/22 .Did you want her to have follow up / labs or is this okay to refill? Please advise ?

## 2021-08-29 NOTE — Telephone Encounter (Signed)
I refilled it for 90 days (other encounter) ? ?Has she had labs since adding the tricor?  ?If not -need to set up fasting lipids ? ?Did not see that in Dr Remus Blake note from January  ? ?Thanks  ?

## 2021-08-30 NOTE — Telephone Encounter (Signed)
Left VM requesting pt to call the office back 

## 2021-09-09 ENCOUNTER — Other Ambulatory Visit: Payer: Self-pay | Admitting: Endocrinology

## 2021-09-10 ENCOUNTER — Encounter: Payer: Self-pay | Admitting: Endocrinology

## 2021-09-10 ENCOUNTER — Ambulatory Visit: Payer: Medicare Other | Admitting: Endocrinology

## 2021-09-10 VITALS — BP 136/80 | HR 68 | Ht 62.0 in | Wt 166.0 lb

## 2021-09-10 DIAGNOSIS — I10 Essential (primary) hypertension: Secondary | ICD-10-CM | POA: Diagnosis not present

## 2021-09-10 DIAGNOSIS — R739 Hyperglycemia, unspecified: Secondary | ICD-10-CM

## 2021-09-10 DIAGNOSIS — E1165 Type 2 diabetes mellitus with hyperglycemia: Secondary | ICD-10-CM

## 2021-09-10 DIAGNOSIS — Z794 Long term (current) use of insulin: Secondary | ICD-10-CM | POA: Diagnosis not present

## 2021-09-10 LAB — POCT GLYCOSYLATED HEMOGLOBIN (HGB A1C): Hemoglobin A1C: 8 % — AB (ref 4.0–5.6)

## 2021-09-10 MED ORDER — EMPAGLIFLOZIN 25 MG PO TABS: 25.0000 mg | ORAL_TABLET | Freq: Every day | ORAL | 2 refills | Status: DC

## 2021-09-10 NOTE — Patient Instructions (Addendum)
Jardiance next Rx is 25mg  1x daily ?Now take 2 of the 10mg  doses ? ?Lantus 24 units daily ? ?Supper dose of Humalog is 6 units  ? ?Check blood sugars on waking up 4 days a week ? ?Also check blood sugars about 2 hours after meals and do this after different meals by rotation ? ?Recommended blood sugar levels on waking up are 90-130 and about 2 hours after meal is 130-180 ? ?Please bring your blood sugar monitor to each visit, thank you ? ?

## 2021-09-10 NOTE — Progress Notes (Signed)
Patient ID: Amanda King, female   DOB: 22-Jul-1940, 81 y.o.   MRN: [id number] ? ?       ? ? ?Reason for Appointment: Follow-up for Type 2 Diabetes ? ?Referring PCP: Dr. Milinda Antis ? ? ?History of Present Illness:  ?        ?Date of diagnosis of type 2 diabetes mellitus:  2008     ? ?Background history:  ?She was initially treated with metformin and subsequently glipizide added ?Januvia was added in 12/2016 ?She has had variable control with A1c as high as 10.3 done in 2015 and as low as 7.3 done in April 2016 ?Has generally been followed by her PCP about twice a year ? ?She was tried on [place] in 2020 ? ?Recent history:  ? ?  ?Non-insulin hypoglycemic drugs:  glipizide ER 10 mg daily, metformin ER 750 mg twice daily, Jardiance 10 mg daily ? ?INSULIN doses: Lantus 20  units daily acs, Humalog 4 units at breakfast and supper ? ?Her A1c is 8, previously 7.3 ? ? ?Current management, blood sugar patterns and problems identified: ?She did bring her monitor for download ?For some reason her blood sugars are much higher in the last 2 weeks  ?Not clear why blood sugars went up abruptly, previously blood sugars in the mornings are in the low 100 range  ?She has not stopped any of her medications as above and has not forgotten any insulin doses  ?However she is taking only 4 units of Humalog instead of 6 units previously prescribed ?Her Lantus was increased by 2units to 20 units on the last visit ?However not checking blood sugars much after dinner and not clear if they are just high fasting  ?Does not think she is going off her diet although sometimes may get some high fat foods like hot dogs  ?As before she is not able to do much exercise except recumbent bike, walking less ?No significant change in her weight ?No side effects like candidiasis from Jardiance ? ? ?Dinner 5-6 p.m. usually ?      ?Side effects from medications have been: Diarrhea from high dose metformin           ? ?Glucose monitoring:  done less than 1 times a  day         Glucometer:  One Touch  ?  ?   ?Blood Glucose readings/averages from meter download by by time of day for 2 weeks ? ? ?PRE-MEAL Fasting Lunch Dinner Bedtime Overall  ?Glucose range: 175-250 165, 174 169  152-250  ?Mean/median: 202    192  ? ?POST-MEAL PC Breakfast PC Lunch PC Dinner  ?Glucose range:   201  ?Mean/median:     ? ?Previously ? ?PRE-MEAL Fasting Lunch Dinner Bedtime Overall  ?Glucose range: 146-199    119-231  ?Mean/median: 167 155   165  ? ?POST-MEAL PC Breakfast PC Lunch PC Dinner  ?Glucose range:   156-231  ?Mean/median:   184  ? ?Previously ? ?PRE-MEAL Fasting Lunch Dinner Bedtime Overall  ?Glucose range: 116-188 96-184   96-188  ?Mean/median: 159 145   155  ? ?POST-MEAL PC Breakfast PC Lunch PC Dinner  ?Glucose range:     ?Mean/median:     ?' ? ?  ?Dietician visit, most recent: 08/12/2018 ? ?Weight history: ? ?Wt Readings from Last 3 Encounters:  ?09/10/21 166 lb (75.3 kg)  ?06/18/21 167 lb 3.2 oz (75.8 kg)  ?05/29/21 169 lb 8 oz (76.9 kg)  ? ? ?  Glycemic control: ?  ?Lab Results  ?Component Value Date  ? HGBA1C 8.0 (A) 09/10/2021  ? HGBA1C 7.3 (H) 04/12/2021  ? HGBA1C 7.3 (A) 10/18/2020  ? ?Lab Results  ?Component Value Date  ? MICROALBUR <0.7 10/18/2020  ? LDLCALC 61 12/27/2019  ? CREATININE 1.17 06/18/2021  ? ?Lab Results  ?Component Value Date  ? MICRALBCREAT 1.0 10/18/2020  ? ? ?Lab Results  ?Component Value Date  ? FRUCTOSAMINE 309 (H) 06/18/2021  ? FRUCTOSAMINE 267 11/22/2018  ? FRUCTOSAMINE 349 (H) 08/18/2018  ? ? ?Office Visit on 09/10/2021  ?Component Date Value Ref Range Status  ? Hemoglobin A1C 09/10/2021 8.0 (A)  4.0 - 5.6 % Final  ? ? ?Allergies as of 09/10/2021   ? ?   Reactions  ? Penicillins Other (See Comments)  ? Has patient had a PCN reaction causing immediate rash, facial/tongue/throat swelling, SOB or lightheadedness with hypotension: Unknown ?Has patient had a PCN reaction causing severe rash involving mucus membranes or skin necrosis: Unknown ?Has patient had a PCN  reaction that required hospitalization: Unknown ?Has patient had a PCN reaction occurring within the last 10 years: No ?If all of the above answers are "NO", then may proceed with Cephalosporin use.  ? ?  ? ?  ?Medication List  ?  ? ?  ? Accurate as of September 10, 2021  9:08 PM. If you have any questions, ask your nurse or doctor.  ?  ?  ? ?  ? ?Align 4 MG Caps ?Take by mouth daily. ?  ?B-D UF III MINI PEN NEEDLES 31G X 5 MM Misc ?Generic drug: Insulin Pen Needle ?USE 3 PEN NEEDLES DAILY TO INJECT INSULIN ?  ?cetirizine 10 MG tablet ?Commonly known as: ZYRTEC ?Take 10 mg by mouth daily as needed for allergies or rhinitis. ?  ?empagliflozin 25 MG Tabs tablet ?Commonly known as: Jardiance ?Take 1 tablet (25 mg total) by mouth daily before breakfast. ?What changed:  ?medication strength ?how much to take ?when to take this ?Changed by: Reather Littler, MD ?  ?fenofibrate 145 MG tablet ?Commonly known as: TRICOR ?Take 1 tablet by mouth once daily ?  ?glipiZIDE 10 MG 24 hr tablet ?Commonly known as: GLUCOTROL XL ?Take 1 tablet by mouth once daily with breakfast ?  ?HumaLOG KwikPen 100 UNIT/ML KwikPen ?Generic drug: insulin lispro ?INJECT 4 UNITS SUBCUTANEOUSLY TWICE DAILY BEFORE MEAL(S) ?  ?hydrochlorothiazide 25 MG tablet ?Commonly known as: HYDRODIURIL ?Take 1/2 (one-half) tablet by mouth once daily ?  ?imipramine 50 MG tablet ?Commonly known as: TOFRANIL ?Take 1 tablet (50 mg total) by mouth at bedtime. ?  ?Lantus SoloStar 100 UNIT/ML Solostar Pen ?Generic drug: insulin glargine ?INJECT 16 UNITS SUBCUTANEOUSLY ONCE DAILY AND INCREASE AS DIRECTED ?  ?levothyroxine 88 MCG tablet ?Commonly known as: Euthyrox ?Take 1 tablet (88 mcg total) by mouth daily before breakfast. ?  ?lisinopril 20 MG tablet ?Commonly known as: ZESTRIL ?Take 1 tablet (20 mg total) by mouth daily. ?  ?metFORMIN 750 MG 24 hr tablet ?Commonly known as: GLUCOPHAGE-XR ?Take 1 tablet by mouth twice daily ?  ?methylphenidate 20 MG tablet ?Commonly known as:  RITALIN ?Take 0.5 tablets (10 mg total) by mouth 3 (three) times daily with meals. ?  ?OneTouch Delica Lancets 30G Misc ?USE 1  TO CHECK GLUCOSE ONCE DAILY ?  ?OneTouch Verio test strip ?Generic drug: glucose blood ?USE TO CHECK BLOOD SUGAR ONCE DAILY ?  ?polyethylene glycol 17 g packet ?Commonly known as: MIRALAX / GLYCOLAX ?Take 17 g  by mouth daily as needed. ?  ?simvastatin 80 MG tablet ?Commonly known as: ZOCOR ?TAKE 1 TABLET BY MOUTH AT BEDTIME ?  ? ?  ? ? ?Allergies:  ?Allergies  ?Allergen Reactions  ? Penicillins Other (See Comments)  ?  Has patient had a PCN reaction causing immediate rash, facial/tongue/throat swelling, SOB or lightheadedness with hypotension: Unknown ?Has patient had a PCN reaction causing severe rash involving mucus membranes or skin necrosis: Unknown ?Has patient had a PCN reaction that required hospitalization: Unknown ?Has patient had a PCN reaction occurring within the last 10 years: No ?If all of the above answers are "NO", then may proceed with Cephalosporin use. ?  ? ? ?Past Medical History:  ?Diagnosis Date  ? Diabetes mellitus   ? Type II  ? Hyperlipidemia   ? Hypertension   ? Hypothyroidism   ? Migraine   ? Narcolepsy with cataplexy   ? Pneumonia 08/2001  ? ? ?Past Surgical History:  ?Procedure Laterality Date  ? APPENDECTOMY  age 89  ? BIOPSY  05/24/2018  ? Procedure: BIOPSY;  Surgeon: Lemar LoftyMansouraty, Gabriel Jr., MD;  [facility] END[facility]COPY;  Service: Gastroenterology;;  ? COLONOSCOPY N/A 05/24/2018  ? Procedure: COLONOSCOPY;  Surgeon: Meridee ScoreMansouraty, Netty StarringGabriel Jr., MD;  [facility];  Service: Gastroenterology;  Laterality: N/A;  ? hemilaminectomy    ? C5-6; C6-7  ? PARTIAL HYSTERECTOMY  age 81  ? POSTERIOR CERVICAL FUSION/FORAMINOTOMY  02/05/1975  ? C5-C6-C7  ? ? ?Family History  ?Problem Relation Age of Onset  ? Heart disease Mother   ?     MI  ? Peripheral vascular disease Mother   ? Hyperlipidemia Mother   ? Depression Brother   ?     suicide  ? Diabetes Brother   ? Diabetes  Brother   ? Sleep apnea Brother   ? Sleep apnea Son   ? Diabetes Son   ? Heart attack Son   ? Sudden Cardiac Death Son   ? ? ?Social History:  reports that she quit smoking about 41 years ago. Her smoking us

## 2021-09-17 ENCOUNTER — Ambulatory Visit (INDEPENDENT_AMBULATORY_CARE_PROVIDER_SITE_OTHER): Payer: Medicare Other | Admitting: *Deleted

## 2021-09-17 ENCOUNTER — Other Ambulatory Visit: Payer: Self-pay | Admitting: Family Medicine

## 2021-09-17 DIAGNOSIS — Z0000 Encounter for general adult medical examination without abnormal findings: Secondary | ICD-10-CM | POA: Diagnosis not present

## 2021-09-17 NOTE — Progress Notes (Signed)
? ?Subjective:  ? Amanda King is a 81 y.o. female who presents for Medicare Annual (Subsequent) preventive examination. ? ?I connected with  Amanda King on 09/17/21 by a Telephone enabled telemedicine application and verified that I am speaking with the correct person using two identifiers. ?  ?I discussed the limitations of evaluation and management by telemedicine. The patient expressed understanding and agreed to proceed. ? ?Patient location: home ? ?Provider location: Tele-Health not in office ? ? ? ?Review of Systems    ? ?Cardiac Risk Factors include: advanced age (>31men, >1 women);hypertension;diabetes mellitus;family history of premature cardiovascular disease;obesity (BMI >30kg/m2) ? ?   ?Objective:  ?  ?Today's Vitals  ? ?There is no height or weight on file to calculate BMI. ? ? ?  09/17/2021  ? 11:39 AM 03/31/2019  ?  9:07 AM 08/12/2018  ?  9:44 AM 05/17/2018  ?  4:28 PM 05/17/2018  ?  6:14 AM 03/09/2018  ?  1:33 PM 02/19/2016  ?  9:40 AM  ?Advanced Directives  ?Does Patient Have a Medical Advance Directive? No No No  No No No  ?Would patient like information on creating a medical advance directive? No - Patient declined No - Patient declined Yes (MAU/Ambulatory/Procedural Areas - Information given) No - Patient declined  No - Patient declined Yes - Educational materials given  ? ? ?Current Medications (verified) ?Outpatient Encounter Medications as of 09/17/2021  ?Medication Sig  ? B-D UF III MINI PEN NEEDLES 31G X 5 MM MISC USE 3 PEN NEEDLES DAILY TO INJECT INSULIN  ? cetirizine (ZYRTEC) 10 MG tablet Take 10 mg by mouth daily as needed for allergies or rhinitis.   ? empagliflozin (JARDIANCE) 25 MG TABS tablet Take 1 tablet (25 mg total) by mouth daily before breakfast.  ? fenofibrate (TRICOR) 145 MG tablet Take 1 tablet by mouth once daily  ? glipiZIDE (GLUCOTROL XL) 10 MG 24 hr tablet Take 1 tablet by mouth once daily with breakfast  ? HUMALOG KWIKPEN 100 UNIT/ML KwikPen INJECT 4 UNITS  SUBCUTANEOUSLY TWICE DAILY BEFORE MEAL(S)  ? hydrochlorothiazide (HYDRODIURIL) 25 MG tablet Take 1/2 (one-half) tablet by mouth once daily  ? imipramine (TOFRANIL) 50 MG tablet Take 1 tablet (50 mg total) by mouth at bedtime.  ? LANTUS SOLOSTAR 100 UNIT/ML Solostar Pen INJECT 16 UNITS SUBCUTANEOUSLY ONCE DAILY AND INCREASE AS DIRECTED  ? levothyroxine (EUTHYROX) 88 MCG tablet Take 1 tablet (88 mcg total) by mouth daily before breakfast.  ? lisinopril (ZESTRIL) 20 MG tablet Take 1 tablet (20 mg total) by mouth daily.  ? metFORMIN (GLUCOPHAGE-XR) 750 MG 24 hr tablet Take 1 tablet by mouth twice daily  ? methylphenidate (RITALIN) 20 MG tablet Take 0.5 tablets (10 mg total) by mouth 3 (three) times daily with meals.  ? OneTouch Delica Lancets 30G MISC USE 1  TO CHECK GLUCOSE ONCE DAILY  ? ONETOUCH VERIO test strip USE TO CHECK BLOOD SUGAR ONCE DAILY  ? polyethylene glycol (MIRALAX / GLYCOLAX) packet Take 17 g by mouth daily as needed.  ? Probiotic Product (ALIGN) 4 MG CAPS Take by mouth daily.  ? simvastatin (ZOCOR) 80 MG tablet TAKE 1 TABLET BY MOUTH AT BEDTIME  ? ?No facility-administered encounter medications on file as of 09/17/2021.  ? ? ?Allergies (verified) ?Penicillins  ? ?History: ?Past Medical History:  ?Diagnosis Date  ? Diabetes mellitus   ? Type II  ? Hyperlipidemia   ? Hypertension   ? Hypothyroidism   ? Migraine   ?  Narcolepsy with cataplexy   ? Pneumonia 08/2001  ? ?Past Surgical History:  ?Procedure Laterality Date  ? APPENDECTOMY  age 89  ? BIOPSY  05/24/2018  ? Procedure: BIOPSY;  Surgeon: Lemar LoftyMansouraty, Gabriel Jr., MD;  [facility];  Service: Gastroenterology;;  ? COLONOSCOPY N/A 05/24/2018  ? Procedure: COLONOSCOPY;  Surgeon: Meridee ScoreMansouraty, Netty StarringGabriel Jr., MD;  [facility];  Service: Gastroenterology;  Laterality: N/A;  ? hemilaminectomy    ? C5-6; C6-7  ? PARTIAL HYSTERECTOMY  age 81  ? POSTERIOR CERVICAL FUSION/FORAMINOTOMY  02/05/1975  ? C5-C6-C7  ? ?Family History  ?Problem Relation  Age of Onset  ? Heart disease Mother   ?     MI  ? Peripheral vascular disease Mother   ? Hyperlipidemia Mother   ? Depression Brother   ?     suicide  ? Diabetes Brother   ? Diabetes Brother   ? Sleep apnea Brother   ? Sleep apnea Son   ? Diabetes Son   ? Heart attack Son   ? Sudden Cardiac Death Son   ? ?Social History  ? ?Socioeconomic History  ? Marital status: Widowed  ?  Spouse name: Not on file  ? Number of children: 3  ? Years of education: GED  ? Highest education level: Not on file  ?Occupational History  ? Occupation: works 1 day a week  ?Tobacco Use  ? Smoking status: Former  ?  Types: Cigarettes  ?  Quit date: 06/09/1980  ?  Years since quitting: 41.3  ? Smokeless tobacco: Never  ?Vaping Use  ? Vaping Use: Never used  ?Substance and Sexual Activity  ? Alcohol use: No  ?  Alcohol/week: 0.0 standard drinks  ? Drug use: No  ? Sexual activity: Never  ?Other Topics Concern  ? Not on file  ?Social History Narrative  ? Patient is widowed.  Walks for exercise.  Patient has three children.Patient is right-handed.Patient drinks three cups of caffeine daily.  Patient is retired.Patient has a GED.  ? ?Social Determinants of Health  ? ?Financial Resource Strain: Low Risk   ? Difficulty of Paying Living Expenses: Not hard at all  ?Food Insecurity: No Food Insecurity  ? Worried About Programme researcher, broadcasting/film/videounning Out of Food in the Last Year: Never true  ? Ran Out of Food in the Last Year: Never true  ?Transportation Needs: No Transportation Needs  ? Lack of Transportation (Medical): No  ? Lack of Transportation (Non-Medical): No  ?Physical Activity: Insufficiently Active  ? Days of Exercise per Week: 3 days  ? Minutes of Exercise per Session: 20 min  ?Stress: No Stress Concern Present  ? Feeling of Stress : Not at all  ?Social Connections: Moderately Isolated  ? Frequency of Communication with Friends and Family: More than three times a week  ? Frequency of Social Gatherings with Friends and Family: More than three times a week  ? Attends  Religious Services: 1 to 4 times per year  ? Active Member of Clubs or Organizations: No  ? Attends BankerClub or Organization Meetings: Never  ? Marital Status: Widowed  ? ? ?Tobacco Counseling ?Counseling given: Not Answered ? ? ?Clinical Intake: ? ?Pre-visit preparation completed: Yes ? ?Pain : No/denies pain ? ?  ? ?Nutritional Risks: None ?Diabetes: Yes ?CBG done?: No ?Did pt. bring in CBG monitor from home?: No ? ?How often do you need to have someone help you when you read instructions, pamphlets, or other written materials from your doctor or pharmacy?: 1 -  Never ? ?Diabetic?  Yes  ? ?Nutrition Risk Assessment: ? ?Has the patient had any N/V/D within the last 2 months?  No  ?Does the patient have any non-healing wounds?  No  ?Has the patient had any unintentional weight loss or weight gain?  No  ? ?Diabetes: ? ?Is the patient diabetic?  Yes  ?If diabetic, was a CBG obtained today?  No  ?Did the patient bring in their glucometer from home?  No  ?How often do you monitor your CBG's? 2 x a day.  ? ?Financial Strains and Diabetes Management: ? ?Are you having any financial strains with the device, your supplies or your medication? No .  ?Does the patient want to be seen by Chronic Care Management for management of their diabetes?  No  ?Would the patient like to be referred to a Nutritionist or for Diabetic Management?  No  ? ?Diabetic Exams: ? ?Diabetic Eye Exam: Completed . Overdue for diabetic eye exam. Pt has been advised about the importance in completing this exam. A referral has been placed today. Message sent to referral coordinator for scheduling purposes. Advised pt to expect a call from office referred to regarding appt. ? ?Diabetic Foot Exam: . Pt has been advised about the importance in completing this exam.  ? ?Interpreter Needed?: No ? ?Information entered by :: Remi Haggard LPN ? ? ?Activities of Daily Living ? ?  09/17/2021  ? 11:40 AM  ?In your present state of health, do you have any difficulty  performing the following activities:  ?Hearing? 1  ?Vision? 0  ?Difficulty concentrating or making decisions? 0  ?Walking or climbing stairs? 1  ?Dressing or bathing? 0  ?Doing errands, shopping? 0  ?Preparing Food and eat

## 2021-09-17 NOTE — Patient Instructions (Signed)
Amanda King , ?Thank you for taking time to come for your Medicare Wellness Visit. I appreciate your ongoing commitment to your health goals. Please review the following plan we discussed and let me know if I can assist you in the future.  ? ?Screening recommendations/referrals: ?Colonoscopy: no longer required  ?Mammogram: Education provided ?Bone Density: Education provided ?Recommended yearly ophthalmology/optometry visit for glaucoma screening and checkup ?Recommended yearly dental visit for hygiene and checkup ? ?Vaccinations: ?Influenza vaccine: up to date ?Pneumococcal vaccine: up to date ?Tdap vaccine: Education provided ?Shingles vaccine: Education provided   ? ?Advanced directives: Education provided  ? ?Conditions/risks identified:  ? ? ?Preventive Care 10 Years and Older, Female ?Preventive care refers to lifestyle choices and visits with your health care provider that can promote health and wellness. ?What does preventive care include? ?A yearly physical exam. This is also called an annual well check. ?Dental exams once or twice a year. ?Routine eye exams. Ask your health care provider how often you should have your eyes checked. ?Personal lifestyle choices, including: ?Daily care of your teeth and gums. ?Regular physical activity. ?Eating a healthy diet. ?Avoiding tobacco and drug use. ?Limiting alcohol use. ?Practicing safe sex. ?Taking low-dose aspirin every day. ?Taking vitamin and mineral supplements as recommended by your health care provider. ?What happens during an annual well check? ?The services and screenings done by your health care provider during your annual well check will depend on your age, overall health, lifestyle risk factors, and family history of disease. ?Counseling  ?Your health care provider may ask you questions about your: ?Alcohol use. ?Tobacco use. ?Drug use. ?Emotional well-being. ?Home and relationship well-being. ?Sexual activity. ?Eating habits. ?History of falls. ?Memory  and ability to understand (cognition). ?Work and work Astronomer. ?Reproductive health. ?Screening  ?You may have the following tests or measurements: ?Height, weight, and BMI. ?Blood pressure. ?Lipid and cholesterol levels. These may be checked every 5 years, or more frequently if you are over 66 years old. ?Skin check. ?Lung cancer screening. You may have this screening every year starting at age 12 if you have a 30-pack-year history of smoking and currently smoke or have quit within the past 15 years. ?Fecal occult blood test (FOBT) of the stool. You may have this test every year starting at age 57. ?Flexible sigmoidoscopy or colonoscopy. You may have a sigmoidoscopy every 5 years or a colonoscopy every 10 years starting at age 81. ?Hepatitis C blood test. ?Hepatitis B blood test. ?Sexually transmitted disease (STD) testing. ?Diabetes screening. This is done by checking your blood sugar (glucose) after you have not eaten for a while (fasting). You may have this done every 1-3 years. ?Bone density scan. This is done to screen for osteoporosis. You may have this done starting at age 59. ?Mammogram. This may be done every 1-2 years. Talk to your health care provider about how often you should have regular mammograms. ?Talk with your health care provider about your test results, treatment options, and if necessary, the need for more tests. ?Vaccines  ?Your health care provider may recommend certain vaccines, such as: ?Influenza vaccine. This is recommended every year. ?Tetanus, diphtheria, and acellular pertussis (Tdap, Td) vaccine. You may need a Td booster every 10 years. ?Zoster vaccine. You may need this after age 40. ?Pneumococcal 13-valent conjugate (PCV13) vaccine. One dose is recommended after age 9. ?Pneumococcal polysaccharide (PPSV23) vaccine. One dose is recommended after age 75. ?Talk to your health care provider about which screenings and vaccines you need and  how often you need them. ?This  information is not intended to replace advice given to you by your health care provider. Make sure you discuss any questions you have with your health care provider. ?Document Released: 06/22/2015 Document Revised: 02/13/2016 Document Reviewed: 03/27/2015 ?Elsevier Interactive Patient Education ? 2017 [employer]. ? ?Fall Prevention in the Home ?Falls can cause injuries. They can happen to people of all ages. There are many things you can do to make your home safe and to help prevent falls. ?What can I do on the outside of my home? ?Regularly fix the edges of walkways and driveways and fix any cracks. ?Remove anything that might make you trip as you walk through a door, such as a raised step or threshold. ?Trim any bushes or trees on the path to your home. ?Use bright outdoor lighting. ?Clear any walking paths of anything that might make someone trip, such as rocks or tools. ?Regularly check to see if handrails are loose or broken. Make sure that both sides of any steps have handrails. ?Any raised decks and porches should have guardrails on the edges. ?Have any leaves, snow, or ice cleared regularly. ?Use sand or salt on walking paths during winter. ?Clean up any spills in your garage right away. This includes oil or grease spills. ?What can I do in the bathroom? ?Use night lights. ?Install grab bars by the toilet and in the tub and shower. Do not use towel bars as grab bars. ?Use non-skid mats or decals in the tub or shower. ?If you need to sit down in the shower, use a plastic, non-slip stool. ?Keep the floor dry. Clean up any water that spills on the floor as soon as it happens. ?Remove soap buildup in the tub or shower regularly. ?Attach bath mats securely with double-sided non-slip rug tape. ?Do not have throw rugs and other things on the floor that can make you trip. ?What can I do in the bedroom? ?Use night lights. ?Make sure that you have a light by your bed that is easy to reach. ?Do not use any sheets or  blankets that are too big for your bed. They should not hang down onto the floor. ?Have a firm chair that has side arms. You can use this for support while you get dressed. ?Do not have throw rugs and other things on the floor that can make you trip. ?What can I do in the kitchen? ?Clean up any spills right away. ?Avoid walking on wet floors. ?Keep items that you use a lot in easy-to-reach places. ?If you need to reach something above you, use a strong step stool that has a grab bar. ?Keep electrical cords out of the way. ?Do not use floor polish or wax that makes floors slippery. If you must use wax, use non-skid floor wax. ?Do not have throw rugs and other things on the floor that can make you trip. ?What can I do with my stairs? ?Do not leave any items on the stairs. ?Make sure that there are handrails on both sides of the stairs and use them. Fix handrails that are broken or loose. Make sure that handrails are as long as the stairways. ?Check any carpeting to make sure that it is firmly attached to the stairs. Fix any carpet that is loose or worn. ?Avoid having throw rugs at the top or bottom of the stairs. If you do have throw rugs, attach them to the floor with carpet tape. ?Make sure that you have  a light switch at the top of the stairs and the bottom of the stairs. If you do not have them, ask someone to add them for you. ?What else can I do to help prevent falls? ?Wear shoes that: ?Do not have high heels. ?Have rubber bottoms. ?Are comfortable and fit you well. ?Are closed at the toe. Do not wear sandals. ?If you use a stepladder: ?Make sure that it is fully opened. Do not climb a closed stepladder. ?Make sure that both sides of the stepladder are locked into place. ?Ask someone to hold it for you, if possible. ?Clearly mark and make sure that you can see: ?Any grab bars or handrails. ?First and last steps. ?Where the edge of each step is. ?Use tools that help you move around (mobility aids) if they are  needed. These include: ?Canes. ?Walkers. ?Scooters. ?Crutches. ?Turn on the lights when you go into a dark area. Replace any light bulbs as soon as they burn out. ?Set up your furniture so you have a clear path.

## 2021-09-24 ENCOUNTER — Telehealth: Payer: Self-pay | Admitting: *Deleted

## 2021-09-24 NOTE — Telephone Encounter (Signed)
Submitted PA methylphenidate on CMM. Key: [id number]. Waiting on determination from [facility]  Medicare Part D.  ?

## 2021-09-24 NOTE — Telephone Encounter (Signed)
PA approved effective from 09/24/2021 through 09/25/2022. ?

## 2021-09-26 DIAGNOSIS — R229 Localized swelling, mass and lump, unspecified: Secondary | ICD-10-CM | POA: Diagnosis not present

## 2021-10-09 ENCOUNTER — Other Ambulatory Visit: Payer: Self-pay | Admitting: Endocrinology

## 2021-10-11 ENCOUNTER — Other Ambulatory Visit: Payer: Self-pay | Admitting: Endocrinology

## 2021-10-17 ENCOUNTER — Ambulatory Visit: Payer: Medicare Other | Admitting: Nurse Practitioner

## 2021-10-17 ENCOUNTER — Encounter: Payer: Self-pay | Admitting: Nurse Practitioner

## 2021-10-17 VITALS — BP 140/76 | HR 92 | Temp 98.6°F | Resp 16 | Wt 165.5 lb

## 2021-10-17 DIAGNOSIS — J020 Streptococcal pharyngitis: Secondary | ICD-10-CM

## 2021-10-17 DIAGNOSIS — J029 Acute pharyngitis, unspecified: Secondary | ICD-10-CM | POA: Diagnosis not present

## 2021-10-17 LAB — POCT RAPID STREP A (OFFICE): Rapid Strep A Screen: POSITIVE — AB

## 2021-10-17 MED ORDER — CEFDINIR 300 MG PO CAPS: 300.0000 mg | ORAL_CAPSULE | Freq: Two times a day (BID) | ORAL | 0 refills | Status: AC

## 2021-10-17 NOTE — Assessment & Plan Note (Signed)
Positive strep test

## 2021-10-17 NOTE — Progress Notes (Signed)
? ?Acute Office Visit ? ?Subjective:  ? ?  ?Patient ID: Amanda King, female    DOB: 1941/04/27, 81 y.o.   MRN: [id number] ? ?Chief Complaint  ?Patient presents with  ? Sore Throat  ?  Started last night 10/16/21. Runny nose, post nasal drip causing cough, headache. No fever.  ? ? ?HPI ?Patient is in today for Sore thorat ? ?States that after she got back from getting groceris she felt tired. States that she went to bed around 10 and woke up around 1130 with a bad sore throat.  ?States that it hurt all night ?She has not tried anything over the counter except for zyrtec and tylenol before she goes to bed ?No sick contacts ?Pfizer x2 ?Flu vaccine in November ? ? ? ?Review of Systems  ?Constitutional:  Positive for malaise/fatigue. Negative for chills and fever.  ?HENT:  Positive for sinus pain and sore throat. Negative for ear discharge and ear pain.   ?     PND + ?  ?Respiratory:  Positive for cough. Negative for shortness of breath.   ?Cardiovascular:  Negative for chest pain.  ?Gastrointestinal:  Negative for abdominal pain, diarrhea, nausea and vomiting.  ?Musculoskeletal:  Negative for joint pain and myalgias.  ?Neurological:  Positive for headaches.  ? ? ?   ?Objective:  ?  ?BP 140/76   Pulse 92   Temp 98.6 ?F (37 ?C)   Resp 16   Wt 165 lb 8 oz (75.1 kg)   SpO2 98%   BMI 30.27 kg/m?  ? ? ?Physical Exam ?Vitals and nursing note reviewed.  ?Constitutional:   ?   Appearance: Normal appearance.  ?HENT:  ?   Right Ear: Tympanic membrane, ear canal and external ear normal.  ?   Left Ear: Tympanic membrane, ear canal and external ear normal.  ?   Mouth/Throat:  ?   Mouth: Mucous membranes are moist.  ?   Pharynx: Posterior oropharyngeal erythema present.  ?Cardiovascular:  ?   Rate and Rhythm: Normal rate and regular rhythm.  ?   Heart sounds: Normal heart sounds.  ?Pulmonary:  ?   Effort: Pulmonary effort is normal.  ?   Breath sounds: Normal breath sounds.  ?Lymphadenopathy:  ?   Cervical: No cervical  adenopathy.  ?Neurological:  ?   Mental Status: She is alert.  ? ? ?Results for orders placed or performed in visit on 10/17/21  ?Rapid Strep A  ?Result Value Ref Range  ? Rapid Strep A Screen Positive (A) Negative  ? ? ? ?   ?Assessment & Plan:  ? ?Problem List Items Addressed This Visit   ? ?  ? Respiratory  ? Strep pharyngitis  ?  Patient has allergy to penicillin but states it was just nausea vomiting diarrhea.  Asked several times patient states she did not have any trouble breathing lip or face swelling nor rash.  She did tolerate Cipro and Rocephin in the hospital in 2019.  We will like to use Omnicef 300 mg twice daily for 7 days.  Did consider using azithromycin the patient's last QTc was 554.  We will like to avoid clindamycin if possible.  His strep test was positive in office ? ?  ?  ? Relevant Medications  ? cefdinir (OMNICEF) 300 MG capsule  ?  ? Other  ? Sore throat - Primary  ?  Positive strep test ? ?  ?  ? Relevant Orders  ? Rapid Strep A (Completed)  ? ? ?  Meds ordered this encounter  ?Medications  ? cefdinir (OMNICEF) 300 MG capsule  ?  Sig: Take 1 capsule (300 mg total) by mouth 2 (two) times daily for 7 days.  ?  Dispense:  14 capsule  ?  Refill:  0  ?  Order Specific Question:   Supervising Provider  ?  Answer:   Loura Pardon A [[id number]]  ? ? ?No follow-ups on file. ? ?Romilda Garret, NP ? ? ?

## 2021-10-17 NOTE — Patient Instructions (Signed)
Nice to see you today ?Follow up if you do not improve ?

## 2021-10-17 NOTE — Assessment & Plan Note (Signed)
Patient has allergy to penicillin but states it was just nausea vomiting diarrhea.  Asked several times patient states she did not have any trouble breathing lip or face swelling nor rash.  She did tolerate Cipro and Rocephin in the hospital in 2019.  We will like to use Omnicef 300 mg twice daily for 7 days.  Did consider using azithromycin the patient's last QTc was 554.  We will like to avoid clindamycin if possible.  His strep test was positive in office ?

## 2021-10-18 ENCOUNTER — Ambulatory Visit: Payer: Self-pay | Admitting: Surgery

## 2021-10-21 ENCOUNTER — Encounter (HOSPITAL_BASED_OUTPATIENT_CLINIC_OR_DEPARTMENT_OTHER): Payer: Self-pay | Admitting: Surgery

## 2021-10-24 ENCOUNTER — Other Ambulatory Visit: Payer: Self-pay | Admitting: Endocrinology

## 2021-10-24 DIAGNOSIS — E11649 Type 2 diabetes mellitus with hypoglycemia without coma: Secondary | ICD-10-CM

## 2021-11-12 ENCOUNTER — Encounter: Payer: Self-pay | Admitting: Endocrinology

## 2021-11-12 ENCOUNTER — Ambulatory Visit: Payer: Medicare Other | Admitting: Endocrinology

## 2021-11-12 VITALS — BP 162/82 | HR 91 | Ht 62.0 in | Wt 166.8 lb

## 2021-11-12 DIAGNOSIS — Z794 Long term (current) use of insulin: Secondary | ICD-10-CM

## 2021-11-12 DIAGNOSIS — E1165 Type 2 diabetes mellitus with hyperglycemia: Secondary | ICD-10-CM

## 2021-11-12 LAB — COMPREHENSIVE METABOLIC PANEL
ALT: 36 U/L — ABNORMAL HIGH (ref 0–35)
AST: 30 U/L (ref 0–37)
Albumin: 4.7 g/dL (ref 3.5–5.2)
Alkaline Phosphatase: 54 U/L (ref 39–117)
BUN: 24 mg/dL — ABNORMAL HIGH (ref 6–23)
CO2: 28 mEq/L (ref 19–32)
Calcium: 10 mg/dL (ref 8.4–10.5)
Chloride: 98 mEq/L (ref 96–112)
Creatinine, Ser: 1.15 mg/dL (ref 0.40–1.20)
GFR: 44.51 mL/min — ABNORMAL LOW (ref >60.00)
Glucose, Bld: 116 mg/dL — ABNORMAL HIGH (ref 70–99)
Potassium: 4.1 mEq/L (ref 3.5–5.1)
Sodium: 136 mEq/L (ref 135–145)
Total Bilirubin: 0.4 mg/dL (ref 0.2–1.2)
Total Protein: 7.8 g/dL (ref 6.0–8.3)

## 2021-11-12 LAB — MICROALBUMIN / CREATININE URINE RATIO
Creatinine,U: 44.6 mg/dL
Microalb Creat Ratio: 1.6 mg/g (ref 0.0–30.0)
Microalb, Ur: <0.7 mg/dL (ref 0.0–1.9)

## 2021-11-12 NOTE — Progress Notes (Signed)
Patient ID: Amanda King, female   DOB: 10/28/1940, 81 y.o.   MRN: [id number]           Reason for Appointment: Follow-up for Type 2 Diabetes  Referring PCP: Dr. Milinda Antis   History of Present Illness:          Date of diagnosis of type 2 diabetes mellitus:  2008      Background history:  She was initially treated with metformin and subsequently glipizide added Januvia was added in 12/2016 She has had variable control with A1c as high as 10.3 done in 2015 and as low as 7.3 done in April 2016 Has generally been followed by her PCP about twice a year  She was tried on [place] in 2020  Recent history:     Non-insulin hypoglycemic drugs:  glipizide ER 10 mg daily, metformin ER 750 mg twice daily, Jardiance 25 mg daily  INSULIN doses: Lantus 24  units daily acs, Humalog 6 units at breakfast and supper  Her A1c is last 8, previously 7.3   Current management, blood sugar patterns and problems identified: She did bring her monitor for download Her blood sugars are settling down by increasing her Lantus by 4 units and Jardiance to the higher dose on the last visit in April  She thinks she is trying to do better with her diet overall also, previously was getting more high fat foods  Still has some variability in her morning sugars and they are mostly over 140 recently  However has not checked readings after dinner Last time she was told to increase her Humalog from 4 up to 6 units and she is doing so However she is not eating much protein at breakfast and by lunchtime sugars may be as low as 89 without hypoglycemia  Taking insulin before eating consistently as directed As before she is not able to do much exercise except recumbent bike No recent change in her weight No side effects like candidiasis from [employer] 5-6 p.m. usually       Side effects from medications have been: Diarrhea from high dose metformin            Glucose monitoring:  done less than 1 times a day          Glucometer:  One Touch       Blood Glucose readings/averages from meter download by by time of day for 2 weeks   PRE-MEAL Fasting Lunch Dinner Bedtime Overall  Glucose range: 102-189 89-127   89-189  Mean/median: 146    140   POST-MEAL PC Breakfast PC Lunch PC Dinner  Glucose range:   ?  Mean/median:      Previously  PRE-MEAL Fasting Lunch Dinner Bedtime Overall  Glucose range: 175-250 165, 174 169  152-250  Mean/median: 202    192   POST-MEAL PC Breakfast PC Lunch PC Dinner  Glucose range:   201  Mean/median:         Dietician visit, most recent: 08/12/2018  Weight history:  Wt Readings from Last 3 Encounters:  11/12/21 166 lb 12.8 oz (75.7 kg)  10/17/21 165 lb 8 oz (75.1 kg)  09/10/21 166 lb (75.3 kg)    Glycemic control:   Lab Results  Component Value Date   HGBA1C 8.0 (A) 09/10/2021   HGBA1C 7.3 (H) 04/12/2021   HGBA1C 7.3 (A) 10/18/2020   Lab Results  Component Value Date   MICROALBUR <0.7 11/12/2021   LDLCALC 61 12/27/2019  CREATININE 1.15 11/12/2021   Lab Results  Component Value Date   MICRALBCREAT 1.6 11/12/2021    Lab Results  Component Value Date   FRUCTOSAMINE 309 (H) 06/18/2021   FRUCTOSAMINE 267 11/22/2018   FRUCTOSAMINE 349 (H) 08/18/2018    Office Visit on 11/12/2021  Component Date Value Ref Range Status   Microalb, Ur 11/12/2021 <0.7  0.0 - 1.9 mg/dL Final   Creatinine,U 29/56/213006/11/2021 44.6  mg/dL Final   Microalb Creat Ratio 11/12/2021 1.6  0.0 - 30.0 mg/g Final   Sodium 11/12/2021 136  135 - 145 mEq/L Final   Potassium 11/12/2021 4.1  3.5 - 5.1 mEq/L Final   Chloride 11/12/2021 98  96 - 112 mEq/L Final   CO2 11/12/2021 28  19 - 32 mEq/L Final   Glucose, Bld 11/12/2021 116 (H)  70 - 99 mg/dL Final   BUN 86/57/846906/11/2021 24 (H)  6 - 23 mg/dL Final   Creatinine, Ser 11/12/2021 1.15  0.40 - 1.20 mg/dL Final   Total Bilirubin 11/12/2021 0.4  0.2 - 1.2 mg/dL Final   Alkaline Phosphatase 11/12/2021 54  39 - 117 U/L Final   AST  11/12/2021 30  0 - 37 U/L Final   ALT 11/12/2021 36 (H)  0 - 35 U/L Final   Total Protein 11/12/2021 7.8  6.0 - 8.3 g/dL Final   Albumin 62/95/284106/11/2021 4.7  3.5 - 5.2 g/dL Final   GFR 32/44/010206/11/2021 44.51 (L)  >60.00 mL/min Final   Calculated using the CKD-EPI Creatinine Equation (2021)   Calcium 11/12/2021 10.0  8.4 - 10.5 mg/dL Final    Allergies as of 11/12/2021       Reactions   Penicillins Other (See Comments)   Has patient had a PCN reaction causing immediate rash, facial/tongue/throat swelling, SOB or lightheadedness with hypotension: Unknown Has patient had a PCN reaction causing severe rash involving mucus membranes or skin necrosis: Unknown Has patient had a PCN reaction that required hospitalization: Unknown Has patient had a PCN reaction occurring within the last 10 years: No If all of the above answers are "NO", then may proceed with Cephalosporin use.        Medication List        Accurate as of November 12, 2021  9:27 PM. If you have any questions, ask your nurse or doctor.          Align 4 MG Caps Take by mouth daily.   B-D UF III MINI PEN NEEDLES 31G X 5 MM Misc Generic drug: Insulin Pen Needle USE 3 PEN NEEDLES DAILY TO INJECT INSULIN   cetirizine 10 MG tablet Commonly known as: ZYRTEC Take 10 mg by mouth daily as needed for allergies or rhinitis.   empagliflozin 25 MG Tabs tablet Commonly known as: Jardiance Take 1 tablet (25 mg total) by mouth daily before breakfast.   fenofibrate 145 MG tablet Commonly known as: TRICOR Take 1 tablet by mouth once daily   glipiZIDE 10 MG 24 hr tablet Commonly known as: GLUCOTROL XL Take 1 tablet by mouth once daily with breakfast   HumaLOG KwikPen 100 UNIT/ML KwikPen Generic drug: insulin lispro INJECT 4 UNITS SUBCUTANEOUSLY TWICE DAILY BEFORE MEAL(S)   hydrochlorothiazide 25 MG tablet Commonly known as: HYDRODIURIL Take 1/2 (one-half) tablet by mouth once daily   imipramine 50 MG tablet Commonly known as:  TOFRANIL Take 1 tablet (50 mg total) by mouth at bedtime.   Lantus SoloStar 100 UNIT/ML Solostar Pen Generic drug: insulin glargine INJECT 16 UNITS SUBCUTANEOUSLY ONCE DAILY  AND  INCREASE  AS  DIRECTED What changed: See the new instructions.   levothyroxine 88 MCG tablet Commonly known as: SYNTHROID TAKE 1 TABLET BY MOUTH ONCE DAILY BEFORE BREAKFAST   lisinopril 20 MG tablet Commonly known as: ZESTRIL Take 1 tablet by mouth once daily   metFORMIN 750 MG 24 hr tablet Commonly known as: GLUCOPHAGE-XR Take 1 tablet by mouth twice daily   methylphenidate 20 MG tablet Commonly known as: RITALIN Take 0.5 tablets (10 mg total) by mouth 3 (three) times daily with meals.   OneTouch Delica Lancets 30G Misc USE 1  TO CHECK GLUCOSE ONCE DAILY   OneTouch Verio test strip Generic drug: glucose blood USE TO CHECK BLOOD SUGAR ONCE DAILY   polyethylene glycol 17 g packet Commonly known as: MIRALAX / GLYCOLAX Take 17 g by mouth daily as needed.   simvastatin 80 MG tablet Commonly known as: ZOCOR TAKE 1 TABLET BY MOUTH AT BEDTIME        Allergies:  Allergies  Allergen Reactions   Penicillins Other (See Comments)    Has patient had a PCN reaction causing immediate rash, facial/tongue/throat swelling, SOB or lightheadedness with hypotension: Unknown Has patient had a PCN reaction causing severe rash involving mucus membranes or skin necrosis: Unknown Has patient had a PCN reaction that required hospitalization: Unknown Has patient had a PCN reaction occurring within the last 10 years: No If all of the above answers are "NO", then may proceed with Cephalosporin use.     Past Medical History:  Diagnosis Date   History of ischemic colitis 05/2018   Hyperlipidemia, mixed    Hypertension    Hypothyroidism    Migraine    Narcolepsy with cataplexy    followed by neurologist--- dr dohmeier   Subcutaneous mass    left shoulder   Type 1 diabetes mellitus on insulin therapy [facility])     endocrinologist--- dr Alyse Low    Past Surgical History:  Procedure Laterality Date   APPENDECTOMY  age 23   BIOPSY  05/24/2018   Procedure: BIOPSY;  Surgeon: Lemar Lofty., MD;  Location: [facility] ENDOSCOPY;  Service: Gastroenterology;;   COLONOSCOPY N/A 05/24/2018   Procedure: COLONOSCOPY;  Surgeon: Lemar Lofty., MD;  Location: [facility] ENDOSCOPY;  Service: Gastroenterology;  Laterality: N/A;   hemilaminectomy     C5-6; C6-7   PARTIAL HYSTERECTOMY  age 84   POSTERIOR CERVICAL FUSION/FORAMINOTOMY  02/05/1975   C5-C6-C7    Family History  Problem Relation Age of Onset   Heart disease Mother        MI   Peripheral vascular disease Mother    Hyperlipidemia Mother    Depression Brother        suicide   Diabetes Brother    Diabetes Brother    Sleep apnea Brother    Sleep apnea Son    Diabetes Son    Heart attack Son    Sudden Cardiac Death Son     Social History:  reports that she quit smoking about 41 years ago. Her smoking use included cigarettes. She has never used smokeless tobacco. She reports that she does not drink alcohol and does not use drugs.   Review of Systems   Lipid history: Has been treated by her PCP with simvastatin 80 mg Has had persistently high triglycerides   Lab Results  Component Value Date   CHOL 182 10/18/2020   HDL 47.80 10/18/2020   LDLCALC 61 12/27/2019   LDLDIRECT 96.0 10/18/2020   TRIG 236.0 (  H) 10/18/2020   CHOLHDL 4 10/18/2020           Hypertension: Has been treated with HCTZ, 12.5 mg lisinopril 20 mg by her PCP  Blood pressure is generally well controlled   BP Readings from Last 3 Encounters:  11/12/21 (!) 162/82  10/17/21 140/76  09/10/21 136/80   Renal function history:  Lab Results  Component Value Date   CREATININE 1.15 11/12/2021   CREATININE 1.17 06/18/2021   CREATININE 1.09 04/12/2021    Most recent eye exam was in 8/20  Most recent foot exam: 05/2020   Hypothyroidism: Taking 88 mcg of  Euthyrox since 11/22 TSH history:   Lab Results  Component Value Date   TSH 3.32 06/18/2021   TSH 5.55 (H) 04/12/2021   TSH 2.42 10/18/2020   FREET4 0.78 04/12/2021   FREET4 0.85 10/18/2020   FREET4 0.95 12/27/2019     Physical Examination:  BP (!) 162/82   Pulse 91   Ht 5\' 2"  (1.575 m)   Wt 166 lb 12.8 oz (75.7 kg)   SpO2 95%   BMI 30.51 kg/m      ASSESSMENT:  Diabetes type 2   See history of present illness for detailed discussion of current diabetes management, blood sugar patterns and problems identified  Her A1c is most recently 8  Blood sugars are relatively better and benefiting from higher doses of Lantus and Jardiance  HYPERTENSION: Blood pressure is high from recent pain   Hypothyroidism: Her TSH will be rechecked on the next visit    PLAN:    She will now use 26 units of Lantus Discussed fasting blood sugar targets If her blood sugars after dinner are still going over 180 she will increase her Humalog to 8 units otherwise reduce her MORNING Humalog to 4 units Start checking by rotation at different time Consider using CGM  Patient Instructions  Lantus 26 units daily  If sugar OVER 180 at nite go upto 8 Humalog  MORNING HUMALOG 4 UNITS    Total visit time including counseling = 30 minutes     11/12/2021, 9:27 PM   Note: This office note was prepared with Dragon voice recognition system technology. Any transcriptional errors that result from this process are unintentional.

## 2021-11-12 NOTE — Patient Instructions (Addendum)
Lantus 26 units daily  If sugar OVER 180 at nite go upto 8 Humalog  MORNING HUMALOG 4 UNITS

## 2021-11-19 ENCOUNTER — Other Ambulatory Visit: Payer: Self-pay | Admitting: Family Medicine

## 2021-11-28 ENCOUNTER — Ambulatory Visit: Payer: Medicare Other | Admitting: Neurology

## 2021-11-28 ENCOUNTER — Encounter: Payer: Self-pay | Admitting: Neurology

## 2021-11-28 DIAGNOSIS — G47411 Narcolepsy with cataplexy: Secondary | ICD-10-CM

## 2021-11-28 DIAGNOSIS — G4710 Hypersomnia, unspecified: Secondary | ICD-10-CM | POA: Diagnosis not present

## 2021-11-28 MED ORDER — METHYLPHENIDATE HCL 20 MG PO TABS: 10.0000 mg | ORAL_TABLET | Freq: Three times a day (TID) | ORAL | 0 refills | Status: DC

## 2021-11-28 MED ORDER — IMIPRAMINE HCL 50 MG PO TABS: 50.0000 mg | ORAL_TABLET | Freq: Every day | ORAL | 3 refills | Status: DC

## 2021-11-28 NOTE — Patient Instructions (Signed)
Narcolepsy Narcolepsy is a neurological disorder that causes people to fall asleep suddenly and without control (have sleep attacks) during the daytime. It is a lifelong disorder. Narcolepsy disrupts the sleep cycle at night, which then causes daytime sleepiness. What are the causes? The cause of narcolepsy is not fully understood, but it may be related to: Low levels of hypocretin, a chemical (neurotransmitter) in the brain that controls sleep and wake cycles. Hypocretin imbalance may be caused by: Abnormal genes that are passed from parent to child (inherited). An autoimmune disease in which the body's defense system (immune system) attacks the brain cells that make hypocretin. Infection, tumor, or injury in the area of the brain that controls sleep. Exposure to poisons (toxins), such as heavy metals, pesticides, and secondhand smoke. What are the signs or symptoms? Symptoms of this condition include: Excessive daytime sleepiness. This is the most common symptom and is usually the first symptom you will notice. This may affect your performance at work or school. Sleep attacks. You may fall asleep in the middle of an activity, especially low-energy activities like reading or watching TV. Feeling like you cannot think clearly and trouble focusing or remembering things. You may also feel depressed. Sudden muscle weakness (cataplexy). When this occurs, your speech may become slurred, or your knees may buckle. Cataplexy is usually triggered by surprise, anger, fear, or laughter. Losing the ability to speak or move (sleep paralysis). This may occur just as you start to fall asleep or wake up. You will be aware of the paralysis. It usually lasts for just a few seconds or minutes. Seeing, hearing, tasting, smelling, or feeling things that are not real (hallucinations). Hallucinations may occur with sleep paralysis. They can happen when you are falling asleep, waking up, or dozing. Trouble staying asleep  at night (insomnia) and restless sleep. How is this diagnosed? This condition may be diagnosed based on: A physical exam to rule out any other problems that may be causing your symptoms. You may be asked to write down your sleeping patterns for several weeks in a sleep diary. This will help your health care provider make a diagnosis. Sleep studies that measure how well your REM sleep is regulated. These tests also measure your heart rate, breathing, movement, and brain waves. These tests include: An overnight sleep study (polysomnogram). A daytime sleep study that is done while you take several naps during the day (multiple sleep latency test, MSLT). This test measures how quickly you fall asleep and how quickly you enter REM sleep. Removal of spinal fluid to measure hypocretin levels. How is this treated? There is no cure for this condition, but treatment can help relieve symptoms. Treatment may include: Lifestyle and sleeping strategies to help you cope with the condition, such as: Exercising regularly. Maintaining a regular sleep schedule. Avoiding caffeine and large meals before bed. Medicines. These may include: Medicines that help keep you awake and alert (stimulants) to fight daytime sleepiness. Medicines that treat depression (antidepressants). These may be used to treat cataplexy. Sodium oxybate. This is a strong medicine to help you relax (sedative) that you may take at night. It can help control daytime sleepiness and cataplexy. Other treatments may include mental health counseling or joining a support group. Follow these instructions at home: Sleeping habits  Get about 8 hours of sleep every night. Go to sleep and get up at about the same time every day. Keep your bedroom dark, quiet, and comfortable. When you feel very tired, take short naps. Schedule naps   so that you take them at about the same time every day. Before bedtime: Avoid bright lights and screens. Relax. Try  activities like reading or taking a warm bath. Activity Get at least 20 minutes of exercise every day. This will help you sleep better at night and reduce daytime sleepiness. Avoid exercising within 3 hours of bedtime. Do not drive or use heavy machinery if you are sleepy. If possible, take a nap before driving. Do not swim or go out on the water without a life jacket. Eating and drinking Do not drink alcohol or caffeinated beverages within 4-5 hours of bedtime. Do not eat a large meal before bedtime. Eat meals at about the same times every day. General instructions  Take over-the-counter and prescription medicines only as told by your health care provider. Keep a sleep diary as told by your health care provider. Tell your employer or teachers that you have narcolepsy. You may be able to adjust your schedule to include time for naps. Do not use any products that contain nicotine or tobacco, such as cigarettes, e-cigarettes, and chewing tobacco. If you need help quitting, ask your health care provider. Keep all follow-up visits as told by your health care provider. This is important. Where to find more information National Institute of Neurological Disorders: www.ninds.nih.gov Contact a health care provider if: Your symptoms are not getting better. You have increasingly high blood pressure (hypertension). You have changes in your heart rhythm. You are having a hard time determining what is real and what is not (psychosis). Get help right away if you: Hurt yourself during a sleep attack or an attack of cataplexy. Have chest pain. Have trouble breathing. These symptoms may represent a serious problem that is an emergency. Do not wait to see if the symptoms will go away. Get medical help right away. Call your local emergency services (911 in the [place] Do not drive yourself to the hospital. Summary Narcolepsy is a neurological disorder that causes people to fall asleep suddenly, and without  control, during the daytime (sleep attacks). It is a lifelong disorder. There is no cure for this condition, but treatment can help relieve symptoms. Go to sleep and get up at about the same time every day. Follow instructions about sleep and activities as told by your health care provider. Take over-the-counter and prescription medicines only as told by your health care provider. This information is not intended to replace advice given to you by your health care provider. Make sure you discuss any questions you have with your health care provider. Document Revised: 07/01/2021 Document Reviewed: 01/05/2019 Elsevier Patient Education  2023 [employer]  

## 2021-11-28 NOTE — Progress Notes (Signed)
[facility]  Provider:  Carmen  Dohmeier, M D  Referring Provider: Tower, Marne A, MD Primary Care Physician:  Tower, Marne A, MD  Chief Complaint  Patient presents with   Follow-up    Pt alone,m 11. Pt states that overall  she is doing well.     HPI:  Amanda King is a 81 y.o. female seen here as a revisit  from Dr. Tower for follow up of symptoms of Hypersomnia, RV on 11-28-2021, patient here followed for 10 years for hypersomnia/ clinical and MSLT confirmed as narcolepsy with cataplexy. She is able to use her medication according to her day's demands. She keeps her own house, mows the lawn, gardening. She is regularly checking BP and glucose at home. She has a let eye ptosis.  She is scheduled for surgery 6-30 with Paul Stechschulte ,MD and will have general anaesthesia. She presents with elevated BP 150/74 mmHg.  Epworth Sleepiness score: 6/ 24 points. FSS at 12 points, Geriatric  Depression score :12/15 BMI 30.       No data to display               05-29-2021.  The patient has been followed since early been 2013.  She carries a diagnosis of narcolepsy with cataplexy now after a evaluation by PSG followed by an MSLT.  For cataplexy she has remained on Tofranil or imipramine and for her sleepiness and daytime she remains on Ritalin and a generic form.  She does have a high blood pressure today 164/79 but she appears relaxed.  No  headaches and no vertigo, she does have some hearing loss.  Her BMI today is 30.5 in winter clothing.    So her weight overall has been stable.  The Epworth sleepiness score is endorsed at only 8 points which is a good resolution on Ritalin.  The fatigue severity score was not filled out, on the geriatric depression scale was endorsed for 0 out of 15 points.    The patient's comorbidities are well controlled she is on Jardiance, Humalog insulin and Lantus Solostar, Synthroid and Zestril Glucotrol, Tricor, Vibra-Tabs and Zyrtec.   Ritalin at 20 mg tablets he takes half tablet up to 3 times a day with meals, she has been on probiotics on and off and MiraLAX. Here for routine RV and refills.   Denies snoring or witnessed apneas,  she lives alone now.   Her youngest child, son Terry, who lived with her died on January 28th after a massive heart-a attack at age 58, she has one surviving son and a daughter. Her deceased son had a febrile illness and had a DOT exam only 2 days to his death  She has a dog now  that is her baby, sleeps in her bed, is always around.  A mix of Chihuhua and dachshund.   Carmen Dohmeier, M.D.    I had last seen Amanda King in 2013 after she was referred to me with a diagnosis of narcolepsy with cataplexy.  She had been treated on generic imipramine and Ritalin;   50  mg of imipramine at night , and 20 mg of methylphenidate 4 times daily. In  2013 she described that in spite of the medication she had a cataplectic attack related to the emotionally upsetting news  of her granddaughter's father-in-law's death.  She reported rare sleep hallucinations and had been followed by Dr. Stiefel , Dr. Adelman  and was Dr. Love's patient until his retirement.  The   patient states that the narcolepsy was diagnosed  after 1989 with cataplectic attacks.;  she was unsure of the was any viral onset and that she has no family history of narcolepsy but at the time underwent a neck surgery with myelogram and somehow  Remained always sleepier since that time.  She reported ongoing cataplectic spells whenever she tried to change dosing of her medication - she had been using Tofranil as the brand name to sleep  but was due to costs and insurance coverage forced to change to generic imipramine, which triggered additional cataplectic attacks.  We have discussed Xyrem in our last visit, but postponed the initiation as she felt that her current regimen was sufficient. The patient initiates bedtime normally at 11 PM, she will fall  asleep promptly, times a week Mondays, Wednesdays and Fridays she will rise at 7 AM to go to the gym. Spontaneously she will wake up between 7 and 8 on other days as well. During the day she often takes a nap on her couch, about 45 minutes duration that she feels is refreshing. The nocturnal sleep time is estimated to be around 8 hours as well. He can be fragmented but visit dreams but he denied any nightmares or any "acting out "of dreams. She is very emotional at funerals and had multiple cataplectic spells in that setting.  The patient just retired from [employer]. after 24 years and is now rearranging her day-to-day life. 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 She isn't from a smoker but quit over 32 years ago and she has not used alcohol or recreational drugs. She would drink up to 3 cups of caffeinated beverages per day.   Rv 10-31-14; Amanda King is here today for which he revisit and refills. She reports doing fine, he had no paralyzing spells recently no nightmares, no dream intrusion. I asked her about cataplexy and she said it right now she seems to be free of so spells. There is that the only time she experiences cataplexy now is when she is really upsets or last time she was at a funeral and grieving. She endorsed today the Epworth sleepiness score at 12 points down from 13 and the fatigue severity score at 12 points. Revisit from 12-20-14 Amanda King has been diagnosed with narcolepsy and cataplexy she has clinically all the necessary symptoms and an HLA test was positive for the HLA he he 1. We were finally unable in May to start her on Xyrem , and she is now on TOFRANIL which she has tolerated well but she takes 1 nocturnal dose.  She takes up to 4 times a day stimulant medication her case Ritalin. Sometimes she can get by this 2 or  3 doses only. It is remarkable that she has found new employment and the energy to work now 6 days a week. Her treatment made the difference.   Interval history from 12/24/2015, Mrs. Borski reports today that she has done very well with her current medical regimen and treatment of narcolepsy. She endorsed 7 points on the Epworth sleepiness score, 9 points on the fatigue severity score and 0 on the geriatric depression score. She has decided that she cannot retire and will work 3 times a week and this keeps her active and happy. She also just  told me that her granddaughter's are 35 and 99 years old and that she has already  2 great-grandchildren 64  years old !  Interval history 22nd of January 2018, Mrs. Frandsen has done very well on Ritalin and Tofranil the treatment of narcolepsy with cataplexy. She was HLA positive for narcolepsy,  she was unable to undergo an MS LT test. She lost her beloved dog just before Christmas 2017 and currently has a new puppy and that she is trying to train. She no longer visits the gym at this time but she has walked more. She loves to read in bed. She does not watch TV in bed.   Interval history from 06 October 2017, Mrs. Ess was last seen by nurse practitioner Megan milligrams 6 months ago.  She is here for routine refill of Ritalin and Tofranil to medications at control narcolepsy and cataplexy.  She has tolerated the medication very well, she has kept physically active she still is working part-time, she reports less frequent dreams and last visit dreams on these medications.  Her Epworth sleepiness score was reduced to 10 points and her fatigue severity is only certain points, the geriatric depression score was endorsed at 0 out of 15 points.  I will refill today Ritalin to 20 mg tablets, taken 2-3 / day.  I gave her 180 # supply .   Review of Systems: Out of a complete 14 system review, the patient complains of only the following symptoms, and all other reviewed  systems are negative. The patient endorsed the fatigue severity scale at 11 , and the Epworth Sleepiness Scale at 8 points , and the geriatric depression score at 2/15 point. She has significant loss of hearing and wears hearing aids bilaterally.  Her son installed some exercise equipment in her upstairs bedroom. How likely are you to doze in the following situations: 0 = not likely, 1 = slight chance, 2 = moderate chance, 3 = high chance  Sitting and Reading? Watching Television? Sitting inactive in a public place (theater or meeting)? Lying down in the afternoon when circumstances permit? Sitting and talking to someone? Sitting quietly after lunch without alcohol? In a car, while stopped for a few minutes in traffic? As a passenger in a car for an hour without a break?  Total = 6 points.  Caffeine- after work at night !  Tobacco- none, quit 37 years ago. 1979 ETOH , 1 beer or wine on a week end.    Social History   Socioeconomic History   Marital status: Widowed    Spouse name: Not on file   Number of children: 3   Years of education: GED   Highest education level: Not on file  Occupational History   Occupation: works 1 day a week  Tobacco Use   Smoking status: Former    Types: Cigarettes    Quit date: 06/09/1980    Years since quitting: 41.4   Smokeless tobacco: Never  Vaping Use   Vaping Use: Never used  Substance and Sexual Activity   Alcohol use: No    Alcohol/week: 0.0 standard drinks of alcohol   Drug use: No   Sexual activity: Never  Other Topics Concern   Not on file  Social History Narrative   Patient is widowed.  Walks for exercise.  Patient has three children.Patient is right-handed.Patient drinks three cups of caffeine daily.  Patient is retired .Patient has a GED. 1-2 cups of coffee in AM.  Alcohol only when eating out  with a group of friends, every 1-2 months.   She has been busier for a cause- she wants to distract herself form her grief since she  lost he son. Her daughter is living nearby. She keeps a great-great-granddaughter at age 5 in her home. ( She herself was 15 at the time at her marriage, had Debbie at age 16 years old. She is 64 and had a daughter at age 17 ( Kim is now 48) , who had 4 children. Her children are now 17, 13, 15 and 10).    Social Determinants of Health   Financial Resource Strain: Low Risk  (09/17/2021)   Overall Financial Resource Strain (CARDIA)    Difficulty of Paying Living Expenses: Not hard at all  Food Insecurity: No Food Insecurity (09/17/2021)   Hunger Vital Sign    Worried About Running Out of Food in the Last Year: Never true    Ran Out of Food in the Last Year: Never true  Transportation Needs: No Transportation Needs (09/17/2021)   PRAPARE - Transportation    Lack of Transportation (Medical): No    Lack of Transportation (Non-Medical): No  Physical Activity: Insufficiently Active (09/17/2021)   Exercise Vital Sign    Days of Exercise per Week: 3 days    Minutes of Exercise per Session: 20 min  Stress: No Stress Concern Present (09/17/2021)   [employer] - Occupational Stress Questionnaire    Feeling of Stress : Not at all  Social Connections: Moderately Isolated (09/17/2021)   Social Connection and Isolation Panel [NHANES]    Frequency of Communication with Friends and Family: More than three times a week    Frequency of Social Gatherings with Friends and Family: More than three times a week    Attends Religious Services: 1 to 4 times per year    Active Member of Clubs or Organizations: No    Attends Club or Organization Meetings: Never    Marital Status: Widowed  Intimate Partner Violence: Not At Risk (09/17/2021)   Humiliation, Afraid, Rape, and Kick questionnaire    Fear of Current or Ex-Partner: No    Emotionally Abused: No    Physically Abused: No    Sexually Abused: No    Family History  Problem Relation Age of Onset   Heart disease Mother        MI    Peripheral vascular disease Mother    Hyperlipidemia Mother    Depression Brother        suicide   Diabetes Brother    Diabetes Brother    Sleep apnea Brother    Sleep apnea Son    Diabetes Son    Heart attack Son    Sudden Cardiac Death Son     Past Medical History:  Diagnosis Date   History of ischemic colitis 05/2018   Hyperlipidemia, mixed    Hypertension    Hypothyroidism    Migraine    Narcolepsy with cataplexy    followed by neurologist--- dr dohmeier   Subcutaneous mass    left shoulder   Type 1 diabetes mellitus on insulin therapy (HCC)    endocrinologist--- dr a. kumar    Past Surgical History:  Procedure Laterality Date   APPENDECTOMY  age 6   BIOPSY  05/24/2018   Procedure: BIOPSY;  Surgeon: Mansouraty, Gabriel Jr., MD;  Location: [facility];  Service: Gastroenterology;;   COLONOSCOPY N/A 05/24/2018   Procedure: COLONOSCOPY;  Surgeon: Mansouraty, Gabriel Jr., MD;  Location: [facility];    Service: Gastroenterology;  Laterality: N/A;   hemilaminectomy     C5-6; C6-7   PARTIAL HYSTERECTOMY  age 80   POSTERIOR CERVICAL FUSION/FORAMINOTOMY  02/05/1975   C5-C6-C7    Current Outpatient Medications  Medication Sig Dispense Refill   B-D UF III MINI PEN NEEDLES 31G X 5 MM MISC USE 3 PEN NEEDLES DAILY TO INJECT INSULIN 100 each 2   cetirizine (ZYRTEC) 10 MG tablet Take 10 mg by mouth daily as needed for allergies or rhinitis.      empagliflozin (JARDIANCE) 25 MG TABS tablet Take 1 tablet (25 mg total) by mouth daily before breakfast. 30 tablet 2   fenofibrate (TRICOR) 145 MG tablet Take 1 tablet by mouth once daily 90 tablet 0   HUMALOG KWIKPEN 100 UNIT/ML KwikPen INJECT 4 UNITS SUBCUTANEOUSLY TWICE DAILY BEFORE MEAL(S) 15 mL 2   hydrochlorothiazide (HYDRODIURIL) 25 MG tablet Take 1/2 (one-half) tablet by mouth once daily 45 tablet 0   imipramine (TOFRANIL) 50 MG tablet Take 1 tablet (50 mg total) by mouth at bedtime. 90 tablet 3   LANTUS SOLOSTAR 100 UNIT/ML  Solostar Pen INJECT 16 UNITS SUBCUTANEOUSLY ONCE DAILY AND  INCREASE  AS  DIRECTED (Patient taking differently: INJECT 24  UNITS SUBCUTANEOUSLY ONCE DAILY AND  INCREASE  AS  DIRECTED) 15 mL 2   levothyroxine (SYNTHROID) 88 MCG tablet TAKE 1 TABLET BY MOUTH ONCE DAILY BEFORE BREAKFAST 90 tablet 2   lisinopril (ZESTRIL) 20 MG tablet Take 1 tablet by mouth once daily 90 tablet 0   metFORMIN (GLUCOPHAGE-XR) 750 MG 24 hr tablet Take 1 tablet by mouth twice daily 180 tablet 0   methylphenidate (RITALIN) 20 MG tablet Take 0.5 tablets (10 mg total) by mouth 3 (three) times daily with meals. 135 tablet 0   OneTouch Delica Lancets 70J MISC USE 1  TO CHECK GLUCOSE ONCE DAILY 100 each 0   ONETOUCH VERIO test strip USE TO CHECK BLOOD SUGAR ONCE DAILY 100 each 0   polyethylene glycol (MIRALAX / GLYCOLAX) packet Take 17 g by mouth daily as needed. 14 each 0   Probiotic Product (ALIGN) 4 MG CAPS Take by mouth daily.     simvastatin (ZOCOR) 80 MG tablet TAKE 1 TABLET BY MOUTH AT BEDTIME 90 tablet 0   No current facility-administered medications for this visit.    Allergies as of 11/28/2021 - Review Complete 11/28/2021  Allergen Reaction Noted   Penicillins Other (See Comments) 04/02/2007    Vitals: BP (!) 150/74   Pulse 95   Ht 5' 2" (1.575 m)   Wt 164 lb (74.4 kg)   BMI 30.00 kg/m  Last Weight:  Wt Readings from Last 1 Encounters:  11/28/21 164 lb (74.4 kg)   Last Height:   Ht Readings from Last 1 Encounters:  11/28/21 5' 2" (1.575 m)   Physical exam: stable weight for years,   General: The patient is awake, alert and appears not in acute distress. The patient is well groomed. Head: Normocephalic, atraumatic. Neck is supple. Mallampati 3 , neck circumference:15.5 . Cardiovascular:  Regular rate and rhythm, without  murmurs or carotid bruit, and without distended neck veins. Respiratory: Lungs are clear to auscultation. Skin:  Without evidence of edema,facial rash-  Trunk: BMI is 30 normal  posture.  Neurologic exam : The patient is awake and alert, oriented to place and time.  Memory subjective  described as intact.  There is a normal attention span & concentration ability.   Cranial nerves: No lossof taste or  smell. Pupils are equal and briskly reactive to light. Funduscopic exam without  evidence of pallor or edema.Extraocular movements  in vertical and horizontal planes intact and without nystagmus. Visual fields by finger perimetry are intact. Hearing to finger rub is impaired with hearing aids in place .  Facial sensation intact to fine touch.  Facial motor strength: Left eye ptosis,  " lazy eye"  . No diplopia.  tongue and uvula move midline. Eyes water.  Motor exam:   Normal tone and normal muscle bulk and symmetric normal strength in all extremities. No sensory abnormality to pin prick.   Assessment:  After physical and neurologic examination, review of laboratory studies, imaging, neurophysiology testing and this patient carries the diagnosis of cataplexy and narcolepsy in 1984, after suffering severe cataplectic attacks.  Sleep hallucinations are rare now.  There is no longer access to the original sleep studies. Her repeat sleep study with MSLT confirmed Narcolepsy.   Plan:  Treatment plan and additional workup :  Patient with good resolution on Tofranil.  Mrs. Littrell carries  the diagnosis of narcolepsy with cataplexy.  Ritalin in daytime , works good at current dose.    HLA testing for narcolepsy with cataplexy was positive for HLA - she has clincally all symptoms and she is doing better on Tofranil and Ritalin, but was denied XYREM,   Medicare coverage had failed for this patient.  She had once not taking the medication for 4 days and suffered from severe sleep a hallucinations, vivid and scary dreams came back. I will Refill Tofranil, for cataplexy treatment.  Refilled Ritalin, prn , but usually twice daily for hypersomnia control..   She reports less  vivid dreams on these medication.    I will refill today Ritalin to 20 mg tablets, taken 2-3 / day. Tofranil ;  I gave her 90 day # 3 refill supply .  Rv with Np in 6 months. Virtual Visit with NP possible.   Carmen Dohmeier, MD     Cc; Dr. Marne Tower      

## 2021-11-29 ENCOUNTER — Other Ambulatory Visit: Payer: Self-pay | Admitting: Endocrinology

## 2021-11-29 ENCOUNTER — Encounter (HOSPITAL_BASED_OUTPATIENT_CLINIC_OR_DEPARTMENT_OTHER): Payer: Self-pay | Admitting: Surgery

## 2021-12-02 ENCOUNTER — Encounter (HOSPITAL_BASED_OUTPATIENT_CLINIC_OR_DEPARTMENT_OTHER): Payer: Self-pay | Admitting: Surgery

## 2021-12-04 ENCOUNTER — Other Ambulatory Visit: Payer: Self-pay | Admitting: Endocrinology

## 2021-12-06 ENCOUNTER — Ambulatory Visit (HOSPITAL_BASED_OUTPATIENT_CLINIC_OR_DEPARTMENT_OTHER)
Admission: RE | Admit: 2021-12-06 | Discharge: 2021-12-06 | Disposition: A | Discharge status: Home / Self Care | Payer: Medicare Other | Attending: Surgery | Admitting: Surgery

## 2021-12-06 ENCOUNTER — Other Ambulatory Visit: Payer: Self-pay

## 2021-12-06 ENCOUNTER — Ambulatory Visit (HOSPITAL_BASED_OUTPATIENT_CLINIC_OR_DEPARTMENT_OTHER): Payer: Medicare Other | Admitting: Anesthesiology

## 2021-12-06 ENCOUNTER — Encounter (HOSPITAL_BASED_OUTPATIENT_CLINIC_OR_DEPARTMENT_OTHER): Payer: Self-pay | Admitting: Surgery

## 2021-12-06 ENCOUNTER — Encounter (HOSPITAL_BASED_OUTPATIENT_CLINIC_OR_DEPARTMENT_OTHER)
Admission: RE | Disposition: A | Discharge status: Home / Self Care | Payer: Self-pay | Source: Home / Self Care | Attending: Surgery

## 2021-12-06 DIAGNOSIS — E119 Type 2 diabetes mellitus without complications: Secondary | ICD-10-CM | POA: Insufficient documentation

## 2021-12-06 DIAGNOSIS — F419 Anxiety disorder, unspecified: Secondary | ICD-10-CM | POA: Diagnosis not present

## 2021-12-06 DIAGNOSIS — R2232 Localized swelling, mass and lump, left upper limb: Secondary | ICD-10-CM | POA: Insufficient documentation

## 2021-12-06 DIAGNOSIS — L723 Sebaceous cyst: Secondary | ICD-10-CM | POA: Diagnosis not present

## 2021-12-06 DIAGNOSIS — Z87891 Personal history of nicotine dependence: Secondary | ICD-10-CM | POA: Insufficient documentation

## 2021-12-06 DIAGNOSIS — I10 Essential (primary) hypertension: Secondary | ICD-10-CM

## 2021-12-06 DIAGNOSIS — Z794 Long term (current) use of insulin: Secondary | ICD-10-CM | POA: Insufficient documentation

## 2021-12-06 DIAGNOSIS — E669 Obesity, unspecified: Secondary | ICD-10-CM | POA: Diagnosis not present

## 2021-12-06 DIAGNOSIS — Z7984 Long term (current) use of oral hypoglycemic drugs: Secondary | ICD-10-CM | POA: Diagnosis not present

## 2021-12-06 DIAGNOSIS — E039 Hypothyroidism, unspecified: Secondary | ICD-10-CM | POA: Insufficient documentation

## 2021-12-06 DIAGNOSIS — Z01818 Encounter for other preprocedural examination: Secondary | ICD-10-CM

## 2021-12-06 HISTORY — PX: MASS EXCISION: SHX2000

## 2021-12-06 HISTORY — DX: Type 1 diabetes mellitus without complications: E10.9

## 2021-12-06 HISTORY — DX: Presence of external hearing-aid: Z97.4

## 2021-12-06 HISTORY — DX: Localized swelling, mass and lump, unspecified: R22.9

## 2021-12-06 HISTORY — DX: Presence of spectacles and contact lenses: Z97.3

## 2021-12-06 HISTORY — DX: Mixed hyperlipidemia: E78.2

## 2021-12-06 HISTORY — DX: Type 2 diabetes mellitus without complications: E11.9

## 2021-12-06 HISTORY — DX: Type 2 diabetes mellitus without complications: Z79.4

## 2021-12-06 HISTORY — DX: Hypersomnia, unspecified: G47.10

## 2021-12-06 LAB — GLUCOSE, CAPILLARY: Glucose-Capillary: 134 mg/dL — ABNORMAL HIGH (ref 70–99)

## 2021-12-06 LAB — POCT I-STAT, CHEM 8
BUN: 29 mg/dL — ABNORMAL HIGH (ref 8–23)
Calcium, Ion: 1.17 mmol/L (ref 1.15–1.40)
Chloride: 102 mmol/L (ref 98–111)
Creatinine, Ser: 1.2 mg/dL — ABNORMAL HIGH (ref 0.44–1.00)
Glucose, Bld: 160 mg/dL — ABNORMAL HIGH (ref 70–99)
HCT: 41 % (ref 36.0–46.0)
Hemoglobin: 13.9 g/dL (ref 12.0–15.0)
Potassium: 3.7 mmol/L (ref 3.5–5.1)
Sodium: 140 mmol/L (ref 135–145)
TCO2: 24 mmol/L (ref 22–32)

## 2021-12-06 SURGERY — EXCISION MASS
Anesthesia: General | Site: Shoulder | Laterality: Left

## 2021-12-06 MED ORDER — CHLORHEXIDINE GLUCONATE CLOTH 2 % EX PADS: 6.0000 | MEDICATED_PAD | Freq: Once | CUTANEOUS | Status: DC

## 2021-12-06 MED ORDER — OXYCODONE HCL 5 MG/5ML PO SOLN: 5.0000 mg | Freq: Once | ORAL | Status: DC | PRN

## 2021-12-06 MED ORDER — FENTANYL CITRATE (PF) 100 MCG/2ML IJ SOLN
INTRAMUSCULAR | Status: DC | PRN
  Administered 2021-12-06: 25 ug via INTRAVENOUS

## 2021-12-06 MED ORDER — ACETAMINOPHEN 500 MG PO TABS
1000.0000 mg | ORAL_TABLET | Freq: Once | ORAL | Status: AC
Start: 2021-12-06 — End: 2021-12-06
  Administered 2021-12-06: 1000 mg via ORAL

## 2021-12-06 MED ORDER — GABAPENTIN 300 MG PO CAPS
300.0000 mg | ORAL_CAPSULE | ORAL | Status: AC
  Administered 2021-12-06: 300 mg via ORAL

## 2021-12-06 MED ORDER — ACETAMINOPHEN 500 MG PO TABS: 1000.0000 mg | ORAL_TABLET | ORAL | Status: DC

## 2021-12-06 MED ORDER — ONDANSETRON HCL 4 MG/2ML IJ SOLN
INTRAMUSCULAR | Status: DC | PRN
  Administered 2021-12-06: 4 mg via INTRAVENOUS

## 2021-12-06 MED ORDER — BUPIVACAINE-EPINEPHRINE 0.25% -1:200000 IJ SOLN
INTRAMUSCULAR | Status: DC | PRN
  Administered 2021-12-06: 10 mL

## 2021-12-06 MED ORDER — 0.9 % SODIUM CHLORIDE (POUR BTL) OPTIME
TOPICAL | Status: DC | PRN
  Administered 2021-12-06: 500 mL

## 2021-12-06 MED ORDER — ONDANSETRON HCL 4 MG/2ML IJ SOLN: 4.0000 mg | Freq: Once | INTRAMUSCULAR | Status: DC | PRN

## 2021-12-06 MED ORDER — LIDOCAINE HCL (CARDIAC) PF 100 MG/5ML IV SOSY
PREFILLED_SYRINGE | INTRAVENOUS | Status: DC | PRN
  Administered 2021-12-06: 40 mg via INTRAVENOUS

## 2021-12-06 MED ORDER — FENTANYL CITRATE (PF) 100 MCG/2ML IJ SOLN: 25.0000 ug | INTRAMUSCULAR | Status: DC | PRN

## 2021-12-06 MED ORDER — LACTATED RINGERS IV SOLN: INTRAVENOUS | Status: DC

## 2021-12-06 MED ORDER — PROPOFOL 10 MG/ML IV BOLUS
INTRAVENOUS | Status: DC | PRN
  Administered 2021-12-06: 120 mg via INTRAVENOUS

## 2021-12-06 MED ORDER — FENTANYL CITRATE (PF) 100 MCG/2ML IJ SOLN
INTRAMUSCULAR | Status: AC
  Filled 2021-12-06: qty 2

## 2021-12-06 MED ORDER — GABAPENTIN 300 MG PO CAPS
ORAL_CAPSULE | ORAL | Status: AC
  Filled 2021-12-06: qty 1

## 2021-12-06 MED ORDER — ACETAMINOPHEN 500 MG PO TABS
ORAL_TABLET | ORAL | Status: AC
  Filled 2021-12-06: qty 2

## 2021-12-06 MED ORDER — OXYCODONE HCL 5 MG PO TABS: 5.0000 mg | ORAL_TABLET | Freq: Once | ORAL | Status: DC | PRN

## 2021-12-06 MED ORDER — TRAMADOL HCL 50 MG PO TABS: 50.0000 mg | ORAL_TABLET | Freq: Four times a day (QID) | ORAL | 0 refills | Status: DC | PRN

## 2021-12-06 SURGICAL SUPPLY — 45 items
ADH SKN CLS APL DERMABOND .7 (GAUZE/BANDAGES/DRESSINGS) ×1
APL PRP STRL LF DISP 70% ISPRP (MISCELLANEOUS) ×1
BLADE SURG 15 STRL LF DISP TIS (BLADE) ×1 IMPLANT
BLADE SURG 15 STRL SS (BLADE) ×2
CHLORAPREP W/TINT 26 (MISCELLANEOUS) ×1 IMPLANT
COVER BACK TABLE 60X90IN (DRAPES) ×2 IMPLANT
COVER MAYO STAND STRL (DRAPES) ×2 IMPLANT
DERMABOND ADVANCED (GAUZE/BANDAGES/DRESSINGS) ×1
DERMABOND ADVANCED .7 DNX12 (GAUZE/BANDAGES/DRESSINGS) IMPLANT
DRAPE LAPAROTOMY 100X72 PEDS (DRAPES) ×1 IMPLANT
DRAPE UTILITY XL STRL (DRAPES) ×2 IMPLANT
ELECT REM PT RETURN 9FT ADLT (ELECTROSURGICAL) ×2
ELECTRODE REM PT RTRN 9FT ADLT (ELECTROSURGICAL) ×1 IMPLANT
GAUZE 4X4 16PLY ~~LOC~~+RFID DBL (SPONGE) ×1 IMPLANT
GAUZE SPONGE 4X4 12PLY STRL (GAUZE/BANDAGES/DRESSINGS) ×2 IMPLANT
GLOVE BIO SURGEON STRL SZ 6 (GLOVE) ×2 IMPLANT
GLOVE BIOGEL PI IND STRL 8 (GLOVE) ×1 IMPLANT
GLOVE BIOGEL PI INDICATOR 8 (GLOVE) ×1
GLOVE INDICATOR 6.5 STRL GRN (GLOVE) ×2 IMPLANT
GLOVE SURG PR MICRO ENCORE 7.5 (GLOVE) ×2 IMPLANT
GOWN STRL REUS W/TWL LRG LVL3 (GOWN DISPOSABLE) ×2 IMPLANT
KIT TURNOVER CYSTO (KITS) ×2 IMPLANT
NDL HYPO 25X1 1.5 SAFETY (NEEDLE) IMPLANT
NEEDLE HYPO 22GX1.5 SAFETY (NEEDLE) ×1 IMPLANT
NEEDLE HYPO 25X1 1.5 SAFETY (NEEDLE) IMPLANT
NS IRRIG 500ML POUR BTL (IV SOLUTION) ×1 IMPLANT
PACK BASIN DAY SURGERY FS (CUSTOM PROCEDURE TRAY) ×2 IMPLANT
PENCIL SMOKE EVACUATOR (MISCELLANEOUS) ×2 IMPLANT
SPONGE T-LAP 18X18 ~~LOC~~+RFID (SPONGE) IMPLANT
SPONGE T-LAP 4X18 ~~LOC~~+RFID (SPONGE) IMPLANT
SUCTION FRAZIER HANDLE 10FR (MISCELLANEOUS)
SUCTION TUBE FRAZIER 10FR DISP (MISCELLANEOUS) IMPLANT
SUT CHROMIC 3 0 SH 27 (SUTURE) IMPLANT
SUT ETHILON 2 0 FS 18 (SUTURE) IMPLANT
SUT MNCRL AB 4-0 PS2 18 (SUTURE) ×2 IMPLANT
SUT VIC AB 2-0 SH 27 (SUTURE)
SUT VIC AB 2-0 SH 27XBRD (SUTURE) IMPLANT
SUT VIC AB 3-0 SH 18 (SUTURE) ×1 IMPLANT
SUT VIC AB 3-0 SH 27 (SUTURE) ×2
SUT VIC AB 3-0 SH 27X BRD (SUTURE) ×1 IMPLANT
SYR BULB IRRIG 60ML STRL (SYRINGE) ×2 IMPLANT
SYR CONTROL 10ML LL (SYRINGE) ×2 IMPLANT
TOWEL OR 17X26 10 PK STRL BLUE (TOWEL DISPOSABLE) ×2 IMPLANT
TUBE CONNECTING 12X1/4 (SUCTIONS) ×2 IMPLANT
YANKAUER SUCT BULB TIP NO VENT (SUCTIONS) ×2 IMPLANT

## 2021-12-06 NOTE — Anesthesia Preprocedure Evaluation (Addendum)
Anesthesia Evaluation  Patient identified by MRN, date of birth, ID band Patient awake    Reviewed: Allergy & Precautions, NPO status , Patient's Chart, lab work & pertinent test results  History of Anesthesia Complications Negative for: history of anesthetic complications  Airway Mallampati: III  TM Distance: >3 FB Neck ROM: Full    Dental  (+) Dental Advisory Given, Upper Dentures   Pulmonary former smoker,    Pulmonary exam normal        Cardiovascular hypertension, Pt. on medications Normal cardiovascular exam     Neuro/Psych  Headaches, PSYCHIATRIC DISORDERS Anxiety  Narcolepsy w/ cataplexy     GI/Hepatic negative GI ROS, Neg liver ROS,   Endo/Other  diabetes, Type 2, Insulin Dependent, Oral Hypoglycemic AgentsHypothyroidism  Obesity   Renal/GU negative Renal ROS     Musculoskeletal negative musculoskeletal ROS (+)   Abdominal   Peds  Hematology negative hematology ROS (+)   Anesthesia Other Findings   Reproductive/Obstetrics                            Anesthesia Physical Anesthesia Plan  ASA: 3  Anesthesia Plan: General   Post-op Pain Management: Tylenol PO (pre-op)*   Induction: Intravenous  PONV Risk Score and Plan: 3 and Treatment may vary due to age or medical condition and Ondansetron  Airway Management Planned: LMA  Additional Equipment: None  Intra-op Plan:   Post-operative Plan: Extubation in OR  Informed Consent: I have reviewed the patients History and Physical, chart, labs and discussed the procedure including the risks, benefits and alternatives for the proposed anesthesia with the patient or authorized representative who has indicated his/her understanding and acceptance.     Dental advisory given  Plan Discussed with: CRNA and Anesthesiologist  Anesthesia Plan Comments:        Anesthesia Quick Evaluation

## 2021-12-06 NOTE — H&P (Signed)
Admitting Physician: Hyman Hopes Render Marley  Service: General surgery  CC: Shoulder mass  Subjective   HPI: Amanda King is an 81 y.o. female who is here for removal of left shoulder mass  Past Medical History:  Diagnosis Date   History of ischemic colitis 05/2018   Hyperlipidemia, mixed    Hypersomnia, persistent    Hypertension    Hypothyroidism    endocrinologist--- dr Lucianne Muss   Migraine    Narcolepsy with cataplexy    followed by neurologist--- dr dohmeier   Subcutaneous mass    left shoulder   Type 2 diabetes mellitus treated with insulin [facility])    endocrinologist--- dr a. Lucianne Muss   (12-02-2021  per pt check blood sugar daily 2 hours after evening meal)   Wears glasses    Wears hearing aid in both ears     Past Surgical History:  Procedure Laterality Date   APPENDECTOMY  1948   BIOPSY  05/24/2018   Procedure: BIOPSY;  Surgeon: Lemar Lofty., MD;  Location: [facility] ENDOSCOPY;  Service: Gastroenterology;;   COLONOSCOPY N/A 05/24/2018   Procedure: COLONOSCOPY;  Surgeon: Lemar Lofty., MD;  Location: [facility] ENDOSCOPY;  Service: Gastroenterology;  Laterality: N/A;   POSTERIOR CERVICAL FUSION/FORAMINOTOMY  02/05/1975   C5-C6-C7   VAGINAL HYSTERECTOMY  1971    Family History  Problem Relation Age of Onset   Heart disease Mother        MI   Peripheral vascular disease Mother    Hyperlipidemia Mother    Depression Brother        suicide   Diabetes Brother    Diabetes Brother    Sleep apnea Brother    Sleep apnea Son    Diabetes Son    Heart attack Son    Sudden Cardiac Death Son     Social:  reports that she quit smoking about 41 years ago. Her smoking use included cigarettes. She has never used smokeless tobacco. She reports that she does not drink alcohol and does not use drugs.  Allergies:  Allergies  Allergen Reactions   Penicillins Nausea And Vomiting and Rash    Has patient had a PCN reaction causing immediate rash, facial/tongue/throat  swelling, SOB or lightheadedness with hypotension: Unknown Has patient had a PCN reaction causing severe rash involving mucus membranes or skin necrosis: Unknown Has patient had a PCN reaction that required hospitalization: Unknown Has patient had a PCN reaction occurring within the last 10 years: No If all of the above answers are "NO", then may proceed with Cephalosporin use.     Medications: Current Outpatient Medications  Medication Instructions   B-D UF III MINI PEN NEEDLES 31G X 5 MM MISC USE 3 PEN NEEDLES DAILY TO INJECT INSULIN   cetirizine (ZYRTEC) 10 mg, Oral, Daily PRN   fenofibrate (TRICOR) 145 MG tablet Take 1 tablet by mouth once daily   HUMALOG KWIKPEN 100 UNIT/ML KwikPen INJECT 4 UNITS SUBCUTANEOUSLY TWICE DAILY BEFORE MEAL(S)   hydrochlorothiazide (HYDRODIURIL) 25 MG tablet Take 1/2 (one-half) tablet by mouth once daily   imipramine (TOFRANIL) 50 mg, Oral, Daily at bedtime   JARDIANCE 25 MG TABS tablet TAKE 1 TABLET BY MOUTH ONCE DAILY BEFORE BREAKFAST   LANTUS SOLOSTAR 100 UNIT/ML Solostar Pen INJECT 16 UNITS SUBCUTANEOUSLY ONCE DAILY AND  INCREASE  AS  DIRECTED   levothyroxine (SYNTHROID) 88 MCG tablet TAKE 1 TABLET BY MOUTH ONCE DAILY BEFORE BREAKFAST   lisinopril (ZESTRIL) 20 MG tablet Take 1 tablet by mouth once daily  metFORMIN (GLUCOPHAGE-XR) 750 MG 24 hr tablet Take 1 tablet by mouth twice daily   methylphenidate (RITALIN) 10 mg, Oral, 3 times daily with meals   OneTouch Delica Lancets 30G MISC USE 1  TO CHECK GLUCOSE ONCE DAILY   ONETOUCH VERIO test strip USE 1 STRIP TO CHECK GLUCOSE ONCE DAILY   polyethylene glycol (MIRALAX / GLYCOLAX) 17 g, Oral, Daily PRN   Probiotic Product (ALIGN) 4 MG CAPS 1 capsule, Oral, Daily   simvastatin (ZOCOR) 80 MG tablet TAKE 1 TABLET BY MOUTH AT BEDTIME    ROS - all of the below systems have been reviewed with the patient and positives are indicated with bold text General: chills, fever or night sweats Eyes: blurry vision or  double vision ENT: epistaxis or sore throat Allergy/Immunology: itchy/watery eyes or nasal congestion Hematologic/Lymphatic: bleeding problems, blood clots or swollen lymph nodes Endocrine: temperature intolerance or unexpected weight changes Breast: new or changing breast lumps or nipple discharge Resp: cough, shortness of breath, or wheezing CV: chest pain or dyspnea on exertion GI: as per HPI GU: dysuria, trouble voiding, or hematuria MSK: joint pain or joint stiffness Neuro: TIA or stroke symptoms Derm: pruritus and skin lesion changes Psych: anxiety and depression  Objective   PE Height 5\' 2"  (1.575 m), weight 74.4 kg. Constitutional: NAD; conversant; no deformities Eyes: Moist conjunctiva; no lid lag; anicteric; PERRL Neck: Trachea midline; no thyromegaly Lungs: Normal respiratory effort; no tactile fremitus CV: RRR; no palpable thrills; no pitting edema GI: Abd Soft, nontender; no palpable hepatosplenomegaly MSK: Normal range of motion of extremities; no clubbing/cyanosis Psychiatric: Appropriate affect; alert and oriented x3 Lymphatic: No palpable cervical or axillary lymphadenopathy  Left shoulder - palpable subcutaneous mass, mobile, rubbery, not fixed to underlying tissues  No results found for this or any previous visit (from the past 24 hour(s)).  Imaging Orders  No imaging studies ordered today     Assessment and Plan   Amanda King is an 81 y.o. female with a left shoulder mass here for excision.  We discussed the procedure, its risks, benefits and alternatives and the patient granted consent to proceed.    ICD-10-CM   1. Pre-op testing  Z01.818 CBG per Guidelines for Diabetes Management for Patients Undergoing Surgery ([facility], [facility], and [facility] only)    CBG per protocol    I-Stat, Chem 8 on day of surgery per protocol    EKG 12 lead per protocol    CBG per Guidelines for Diabetes Management for Patients Undergoing Surgery ([facility], [facility], and [facility] only)    CBG per  protocol    I-Stat, Chem 8 on day of surgery per protocol    EKG 12 lead per protocol       97, MD  [facility] Use [email or web address] to contact on call provider

## 2021-12-06 NOTE — Op Note (Signed)
   Patient: Amanda King (June 19, 1940, [id number])  Date of Surgery: 12/06/2021   Preoperative Diagnosis: LEFT SHOULDER SUBCUTANEOUS MASS   Postoperative Diagnosis: LEFT SHOULDER SEBACEOUS CYST 0.5cm x 0.5 cm  Surgical Procedure: LEFT SHOULDER SUBCUTANEOUS MASS EXCISION:    Operative Team Members:  Surgeon(s) and Role:    * Tameca Jerez, Hyman Hopes, MD - Primary   Anesthesiologist: Beryle Lathe, MD CRNA: Earmon Phoenix, CRNA   Anesthesia: General   Fluids:  No intake/output data recorded.  Complications: * No complications entered in OR log *  Drains:  none   Specimen: * No specimens in log *   Disposition:  PACU - hemodynamically stable.  Plan of Care: Discharge to home after PACU    Indications for Procedure: [name] is a 81 y.o. female who presented with a left shoulder subcutaneous mass.  I recommended excision.  The procedure itself as well as its risks, benefits and alternatives were discussed.  The risks discussed included but were not limited to the risk of infection, bleeding, damage to nearby structures, and recurrent issues.  After a full discussion and all questions answered the patient granted consent to proceed.  Findings: Left shoulder sebaceous cyst   Description of Procedure:   On the date stated above, the patient was taken to the operating room suite and placed in supine position.  General LMA anesthesia was induced.  A timeout was completed verifying the correct patient, procedure, position, and equipment needed for the case.  Patient's left shoulder was prepped and draped in usual sterile fashion.  An elliptical incision was made around the left shoulder subcutaneous mass.  I dissected through the subcutaneous tissues to find a sebaceous cyst cavity which was relatively decompressed.  This was excised with a rim of normal tissue.  The wound was irrigated.  Hemostasis was obtained with electrocautery.  The wound was closed with 3-0 Vicryl  for the deeper layer and 4-0 Monocryl and Dermabond.  All sponge and needle counts were correct at the end of this case.  The cyst measured approximately 0.5 cm x 0.5 cm, and was not sent for pathology.  At the end of the case we reviewed the infection status of the case. Patient: Private Patient Elective Case Case: Elective Infection Present At Time Of Surgery (PATOS):  Sebaceous cyst cavity excised  Ivar Drape, MD General, Bariatric, & Minimally Invasive Surgery [facility], [place]

## 2021-12-06 NOTE — Transfer of Care (Signed)
Immediate Anesthesia Transfer of Care Note  Patient: Amanda King  Procedure(s) Performed: LEFT SHOULDER SUBCUTANEOUS MASS EXCISION (Left: Shoulder)  Patient Location: PACU  Anesthesia Type:General  Level of Consciousness: awake and patient cooperative  Airway & Oxygen Therapy: Patient Spontanous Breathing and Patient connected to nasal cannula oxygen  Post-op Assessment: Report given to RN and Post -op Vital signs reviewed and stable  Post vital signs: Reviewed and stable  Last Vitals:  Vitals Value Taken Time  BP 140/53 12/06/21 1013  Temp    Pulse 90 12/06/21 1013  Resp    SpO2 98 % 12/06/21 1013    Last Pain:  Vitals:   12/06/21 0742  TempSrc: Oral  PainSc: 0-No pain      Patients Stated Pain Goal: 7 (12/06/21 0742)  Complications: No notable events documented.

## 2021-12-06 NOTE — Discharge Instructions (Signed)
NO TYLENOL PRODUCTS UNTIL 1:45 PM TODAY.     Post Anesthesia Home Care Instructions  Activity: Get plenty of rest for the remainder of the day. A responsible individual must stay with you for 24 hours following the procedure.  For the next 24 hours, DO NOT: -Drive a car -Advertising copywriter -Drink alcoholic beverages -Take any medication unless instructed by your physician -Make any legal decisions or sign important papers.  Meals: Start with liquid foods such as gelatin or soup. Progress to regular foods as tolerated. Avoid greasy, spicy, heavy foods. If nausea and/or vomiting occur, drink only clear liquids until the nausea and/or vomiting subsides. Call your physician if vomiting continues.  Special Instructions/Symptoms: Your throat may feel dry or sore from the anesthesia or the breathing tube placed in your throat during surgery. If this causes discomfort, gargle with warm salt water. The discomfort should disappear within 24 hours.

## 2021-12-06 NOTE — Anesthesia Postprocedure Evaluation (Signed)
Anesthesia Post Note  Patient: Amanda King  Procedure(s) Performed: LEFT SHOULDER SUBCUTANEOUS MASS EXCISION (Left: Shoulder)     Patient location during evaluation: PACU Anesthesia Type: General Level of consciousness: awake and alert Pain management: pain level controlled Vital Signs Assessment: post-procedure vital signs reviewed and stable Respiratory status: spontaneous breathing, nonlabored ventilation and respiratory function stable Cardiovascular status: stable and blood pressure returned to baseline Anesthetic complications: no   No notable events documented.  Last Vitals:  Vitals:   12/06/21 1015 12/06/21 1030  BP: (!) 138/57 (!) 142/59  Pulse: 89 87  Resp: 14 12  Temp:    SpO2: 98% 99%    Last Pain:  Vitals:   12/06/21 1013  TempSrc:   PainSc: 0-No pain                 Beryle Lathe

## 2021-12-09 ENCOUNTER — Other Ambulatory Visit: Payer: Self-pay

## 2021-12-09 MED ORDER — LANTUS SOLOSTAR 100 UNIT/ML ~~LOC~~ SOPN: 26.0000 [IU] | PEN_INJECTOR | Freq: Every evening | SUBCUTANEOUS | 1 refills | Status: DC

## 2021-12-11 ENCOUNTER — Other Ambulatory Visit: Payer: Self-pay | Admitting: Family Medicine

## 2021-12-11 ENCOUNTER — Encounter (HOSPITAL_BASED_OUTPATIENT_CLINIC_OR_DEPARTMENT_OTHER): Payer: Self-pay | Admitting: Surgery

## 2021-12-11 ENCOUNTER — Other Ambulatory Visit: Payer: Self-pay

## 2021-12-11 DIAGNOSIS — E1165 Type 2 diabetes mellitus with hyperglycemia: Secondary | ICD-10-CM

## 2021-12-11 MED ORDER — LANTUS SOLOSTAR 100 UNIT/ML ~~LOC~~ SOPN: 26.0000 [IU] | PEN_INJECTOR | Freq: Every evening | SUBCUTANEOUS | 1 refills | Status: DC

## 2021-12-13 ENCOUNTER — Other Ambulatory Visit: Payer: Self-pay

## 2021-12-13 DIAGNOSIS — E1165 Type 2 diabetes mellitus with hyperglycemia: Secondary | ICD-10-CM

## 2021-12-13 MED ORDER — LANTUS SOLOSTAR 100 UNIT/ML ~~LOC~~ SOPN: 26.0000 [IU] | PEN_INJECTOR | Freq: Every evening | SUBCUTANEOUS | 1 refills | Status: DC

## 2021-12-13 MED ORDER — LANTUS SOLOSTAR 100 UNIT/ML ~~LOC~~ SOPN: PEN_INJECTOR | SUBCUTANEOUS | 1 refills | Status: DC

## 2021-12-13 NOTE — Addendum Note (Signed)
Addended by: Eden Lathe on: 12/13/2021 10:31 AM   Modules accepted: Orders

## 2021-12-26 ENCOUNTER — Telehealth: Payer: Self-pay | Admitting: Neurology

## 2021-12-26 NOTE — Telephone Encounter (Signed)
Reviewed pt chart. Last seen 11/28/21 and next f/u 06/16/22.  Last refill sent 11/28/21 #135 (90days supply) to [facility] #[id number].  Per drug registry, she last refilled 07/29/21 #135.  Refill should be on file at pharmacy for her to get refill. I called pt and relayed above message. She will f/u with pharmacy, nothing further needed.

## 2021-12-26 NOTE — Telephone Encounter (Signed)
Pt is requesting a refill for methylphenidate (RITALIN) 20 MG tablet.  Pharmacy: [facility] [phone]

## 2022-01-03 ENCOUNTER — Encounter: Payer: Self-pay | Admitting: Family Medicine

## 2022-01-03 ENCOUNTER — Ambulatory Visit (INDEPENDENT_AMBULATORY_CARE_PROVIDER_SITE_OTHER): Payer: Medicare Other | Admitting: Family Medicine

## 2022-01-03 VITALS — BP 138/72 | HR 94 | Ht 62.0 in | Wt 165.0 lb

## 2022-01-03 DIAGNOSIS — Z0000 Encounter for general adult medical examination without abnormal findings: Secondary | ICD-10-CM | POA: Diagnosis not present

## 2022-01-03 DIAGNOSIS — Z1231 Encounter for screening mammogram for malignant neoplasm of breast: Secondary | ICD-10-CM | POA: Insufficient documentation

## 2022-01-03 DIAGNOSIS — E1169 Type 2 diabetes mellitus with other specified complication: Secondary | ICD-10-CM | POA: Diagnosis not present

## 2022-01-03 DIAGNOSIS — E039 Hypothyroidism, unspecified: Secondary | ICD-10-CM | POA: Diagnosis not present

## 2022-01-03 DIAGNOSIS — E785 Hyperlipidemia, unspecified: Secondary | ICD-10-CM

## 2022-01-03 DIAGNOSIS — I10 Essential (primary) hypertension: Secondary | ICD-10-CM

## 2022-01-03 DIAGNOSIS — E669 Obesity, unspecified: Secondary | ICD-10-CM

## 2022-01-03 NOTE — Progress Notes (Unsigned)
   Subjective:    Patient ID: Amanda King, female    DOB: [birth year], 81 y.o.   MRN: [id number]  HPI Here for health maintenance exam and to review chronic medical problems    Amw was April -reviewed  Wt Readings from Last 3 Encounters:  01/03/22 165 lb (74.8 kg)  12/06/21 164 lb 11.2 oz (74.7 kg)  11/28/21 164 lb (74.4 kg)   30.18 kg/m  Doing ok  Went to the beach for a week and enjoyed it   Feeling pretty good overall   Had a lipoma removed on L shoulder- it went well    Immunization History  Administered Date(s) Administered   Fluad Quad(high Dose 65+) 02/22/2019, 04/16/2021   Influenza Split 03/10/2011   Influenza Whole 06/09/2002, 04/09/2007, 03/02/2008, 03/23/2009, 04/09/2010   Influenza, High Dose Seasonal PF 05/13/2017, 03/09/2018, 04/06/2020   Influenza,inj,Quad PF,6+ Mos 04/30/2015, 02/15/2016   Influenza-Unspecified 03/07/2014   PFIZER(Purple Top)SARS-COV-2 Vaccination 09/03/2019, 09/24/2019   Pneumococcal Conjugate-13 08/03/2015   Pneumococcal Polysaccharide-23 06/09/2002, 03/02/2008   Td 04/25/1997   Tdap 07/01/2011   Health Maintenance Due  Topic Date Due   OPHTHALMOLOGY EXAM  12/08/2019   FOOT EXAM  05/24/2021    Declines shingrix   Her son died at 69  Heart attack after he had been sick   Mammogram : declined in the past/ then her son died   Self breast exam : no lumps   Colon screening - colonoscopy 2019 with ischemic colitis  No more problems since then    Dexa osteopenia in 06 Declines now   Falls: none Fractures:none  Supplements : vitamin D Exercise : around the house and does her own yard work   HTN bp is stable today  No cp or palpitations or headaches or edema  No side effects to medicines  BP Readings from Last 3 Encounters:  01/03/22 138/72  12/06/21 (!) 134/57  11/28/21 (!) 150/74     Lisinopril 20 mg daily  Hctz 12.5 mg daily     Sees endocrinology for DM2 Lab Results  Component Value Date   HGBA1C 8.0  (A) 09/10/2021  Per pt doing better now  Back on track with her diet  Due for eye exam -needs to schedule it  Sees neuro for narcolepsy   Hyperlipidemia Lab Results  Component Value Date   CHOL 182 10/18/2020   HDL 47.80 10/18/2020   LDLCALC 61 12/27/2019   LDLDIRECT 96.0 10/18/2020   TRIG 236.0 (H) 10/18/2020   CHOLHDL 4 10/18/2020   Simvastatin 80 mg daily  Tricor 145 mg daily   The ASCVD Risk score (Arnett DK, et al., 2019) failed to calculate for the following reasons:   The 2019 ASCVD risk score is only valid for ages 3 to 100    Hypothyroidism  Pt has no clinical changes No change in energy level/ hair or skin/ edema and no tremor Lab Results  Component Value Date   TSH 3.32 06/18/2021    Sees endocrinology  Levothyroxine 88 mcg         Review of Systems     Objective:   Physical Exam        Assessment & Plan:

## 2022-01-03 NOTE — Patient Instructions (Addendum)
Please schedule your eye exam   Labs today   Please schedule your annual mammogram at [facility])  Continue vitamin D for bone health  Exercise helps your bones also   Please call the location of your choice from the menu below to schedule your Mammogram and/or Bone Density appointment.    [facility]   [facility]                      Phone:  [phone] [address]                               [place], [place] [address]                                                             Services: Traditional and 3D Mammogram, Bone Density   [facility]                 Phone: [phone] [address]                                                       [place], [place] [address]    Service: Bone Density ONLY   *this site does NOT perform mammograms  [facility]                        Phone:  [phone] [address]                                  [place], [place] [address]                                            Services:  3D Mammogram and Bone Density    [facility]   Phone:  [phone]   [address]                                                                            [place], [place] [address]                                            Services: 3D Mammogram and Bone [facility])  Phone:  [phone]   [address]  [place], [place] [address]                                              Services:  3D Mammogram and Bone Density

## 2022-01-04 LAB — COMPREHENSIVE METABOLIC PANEL
AG Ratio: 1.8 (calc) (ref 1.0–2.5)
ALT: 25 U/L (ref 6–29)
AST: 18 U/L (ref 10–35)
Albumin: 4.8 g/dL (ref 3.6–5.1)
Alkaline phosphatase (APISO): 57 U/L (ref 37–153)
BUN/Creatinine Ratio: 25 (calc) — ABNORMAL HIGH (ref 6–22)
BUN: 30 mg/dL — ABNORMAL HIGH (ref 7–25)
CO2: 24 mmol/L (ref 20–32)
Calcium: 9.6 mg/dL (ref 8.6–10.4)
Chloride: 100 mmol/L (ref 98–110)
Creat: 1.18 mg/dL — ABNORMAL HIGH (ref 0.60–0.95)
Globulin: 2.7 g/dL (calc) (ref 1.9–3.7)
Glucose, Bld: 102 mg/dL — ABNORMAL HIGH (ref 65–99)
Potassium: 4 mmol/L (ref 3.5–5.3)
Sodium: 138 mmol/L (ref 135–146)
Total Bilirubin: 0.4 mg/dL (ref 0.2–1.2)
Total Protein: 7.5 g/dL (ref 6.1–8.1)

## 2022-01-04 LAB — CBC WITH DIFFERENTIAL/PLATELET
Absolute Monocytes: 585 cells/uL (ref 200–950)
Basophils Absolute: 27 cells/uL (ref 0–200)
Basophils Relative: 0.3 %
Eosinophils Absolute: 180 cells/uL (ref 15–500)
Eosinophils Relative: 2 %
HCT: 42.1 % (ref 35.0–45.0)
Hemoglobin: 14.6 g/dL (ref 11.7–15.5)
Lymphs Abs: 2979 cells/uL (ref 850–3900)
MCH: 29.9 pg (ref 27.0–33.0)
MCHC: 34.7 g/dL (ref 32.0–36.0)
MCV: 86.3 fL (ref 80.0–100.0)
MPV: 9.9 fL (ref 7.5–12.5)
Monocytes Relative: 6.5 %
Neutro Abs: 5229 cells/uL (ref 1500–7800)
Neutrophils Relative %: 58.1 %
Platelets: 338 10*3/uL (ref 140–400)
RBC: 4.88 10*6/uL (ref 3.80–5.10)
RDW: 12.3 % (ref 11.0–15.0)
Total Lymphocyte: 33.1 %
WBC: 9 10*3/uL (ref 3.8–10.8)

## 2022-01-04 LAB — LIPID PANEL
Cholesterol: 213 mg/dL — ABNORMAL HIGH (ref <200)
HDL: 62 mg/dL (ref >=50)
LDL Cholesterol (Calc): 111 mg/dL (calc) — ABNORMAL HIGH
Non-HDL Cholesterol (Calc): 151 mg/dL (calc) — ABNORMAL HIGH (ref <130)
Total CHOL/HDL Ratio: 3.4 (calc) (ref <5.0)
Triglycerides: 303 mg/dL — ABNORMAL HIGH (ref <150)

## 2022-01-05 ENCOUNTER — Other Ambulatory Visit: Payer: Self-pay | Admitting: Endocrinology

## 2022-01-05 NOTE — Assessment & Plan Note (Signed)
Reviewed health habits including diet and exercise and skin cancer prevention Reviewed appropriate screening tests for age  Also reviewed health mt list, fam hx and immunization status , as well as social and family history   See HPI Labs ordered  Declines shingrix vaccine  Declined cardiology referral (son had sudden cardiac death)  Mammogram ordered/she will call to schedule  Colonoscopy utd  Declines dexa  (no falls or fx and takes vit D_) Encouraged weight bearing exercise

## 2022-01-05 NOTE — Assessment & Plan Note (Signed)
bp in fair control at this time  BP Readings from Last 1 Encounters:  01/03/22 138/72   No changes needed Most recent labs reviewed  Disc lifstyle change with low sodium diet and exercise  Plan to continue lisinopril 20 mg daily  hctz 12.5 mg daily  Labs ordered

## 2022-01-05 NOTE — Assessment & Plan Note (Signed)
Mammogram ordered  Pt will call to schedule  Unremarkable exam

## 2022-01-05 NOTE — Assessment & Plan Note (Signed)
Discussed how this problem influences overall health and the risks it imposes  Reviewed plan for weight loss with lower calorie diet (via better food choices and also portion control or program like weight watchers) and exercise building up to or more than 30 minutes 5 days per week including some aerobic activity    

## 2022-01-05 NOTE — Assessment & Plan Note (Addendum)
Disc goals for lipids and reasons to control them Rev last labs with pt Rev low sat fat diet in detail Labs ordered Taking simvastatin 80 mg daily  tricor 145 mg daily   Offered ref to cardiology to disc vascular risks in light of recent sudden death of son She declines

## 2022-01-05 NOTE — Assessment & Plan Note (Signed)
TSH ordered  Sees endocrinology  Taking levothyroxine 88 mcg daily

## 2022-01-05 NOTE — Assessment & Plan Note (Signed)
Sees endocrinology  a1c was 8 in April, per pt doing better now   Encouraged her to schedule her eye exam

## 2022-01-23 ENCOUNTER — Other Ambulatory Visit: Payer: Self-pay | Admitting: Endocrinology

## 2022-01-23 DIAGNOSIS — E11649 Type 2 diabetes mellitus with hypoglycemia without coma: Secondary | ICD-10-CM

## 2022-02-06 ENCOUNTER — Telehealth: Payer: Self-pay | Admitting: Neurology

## 2022-02-06 NOTE — Telephone Encounter (Signed)
Called Pt. Left vm message to call [facility] to reschedule appointment. MD will be out

## 2022-02-10 ENCOUNTER — Other Ambulatory Visit: Payer: Self-pay | Admitting: Family Medicine

## 2022-02-12 ENCOUNTER — Encounter: Payer: Self-pay | Admitting: Endocrinology

## 2022-02-12 ENCOUNTER — Ambulatory Visit: Payer: Medicare Other | Admitting: Endocrinology

## 2022-02-12 VITALS — BP 142/86 | HR 100 | Ht 62.0 in | Wt 166.6 lb

## 2022-02-12 DIAGNOSIS — E1165 Type 2 diabetes mellitus with hyperglycemia: Secondary | ICD-10-CM | POA: Diagnosis not present

## 2022-02-12 DIAGNOSIS — E782 Mixed hyperlipidemia: Secondary | ICD-10-CM

## 2022-02-12 DIAGNOSIS — E063 Autoimmune thyroiditis: Secondary | ICD-10-CM | POA: Diagnosis not present

## 2022-02-12 DIAGNOSIS — Z794 Long term (current) use of insulin: Secondary | ICD-10-CM

## 2022-02-12 LAB — BASIC METABOLIC PANEL
BUN: 23 mg/dL (ref 6–23)
CO2: 29 mEq/L (ref 19–32)
Calcium: 9.8 mg/dL (ref 8.4–10.5)
Chloride: 98 mEq/L (ref 96–112)
Creatinine, Ser: 1.11 mg/dL (ref 0.40–1.20)
GFR: 46.36 mL/min — ABNORMAL LOW (ref >60.00)
Glucose, Bld: 145 mg/dL — ABNORMAL HIGH (ref 70–99)
Potassium: 4.5 mEq/L (ref 3.5–5.1)
Sodium: 136 mEq/L (ref 135–145)

## 2022-02-12 LAB — POCT GLYCOSYLATED HEMOGLOBIN (HGB A1C): Hemoglobin A1C: 7.2 % — AB (ref 4.0–5.6)

## 2022-02-12 LAB — LDL CHOLESTEROL, DIRECT: Direct LDL: 110 mg/dL

## 2022-02-12 MED ORDER — FLUCONAZOLE 150 MG PO TABS: 150.0000 mg | ORAL_TABLET | Freq: Once | ORAL | 0 refills | Status: AC

## 2022-02-12 MED ORDER — EMPAGLIFLOZIN 25 MG PO TABS: 25.0000 mg | ORAL_TABLET | Freq: Every day | ORAL | 3 refills | Status: DC

## 2022-02-12 NOTE — Progress Notes (Signed)
Patient ID: Amanda King, female   DOB: 06/30/1940, 81 y.o.   MRN: [id number]           Reason for Appointment: Follow-up for Type 2 Diabetes  Referring PCP: Dr. Milinda Antis   History of Present Illness:          Date of diagnosis of type 2 diabetes mellitus:  2008      Background history:  She was initially treated with metformin and subsequently glipizide added Januvia was added in 12/2016 She has had variable control with A1c as high as 10.3 done in 2015 and as low as 7.3 done in April 2016 Has generally been followed by her PCP about twice a year  She was tried on [place] in 2020  Recent history:     Non-insulin hypoglycemic drugs: Metformin ER 750 mg twice daily, not on Jardiance 25 mg daily  INSULIN doses: Lantus 24  units daily acs, Humalog 6 units at breakfast and supper  Her A1c is 7.2, improved from 4/23   Current management, blood sugar patterns and problems identified: She did bring her monitor for download She stopped Jardiance without letting us know about 5 or 6 weeks ago because of yeast infections.  She did not have this treated by prescription and only used OTC creams Also  concerned about the cost of the Jardiance  Glipizide was stopped in June Recently however her blood sugars look similar to her prior visit Not checking enough readings after meals  Blood sugars go up when she is eating out and eating high-fat foods like [place] fries and pizza Morning sugars are mildly increased overall again As before she is not able to do much exercise except recumbent bike No recent change in her weight    Dinner 5-6 p.m. usually       Side effects from medications have been: Diarrhea from high dose metformin            Glucose monitoring:  done less than 1 times a day         Glucometer:  One Touch       Blood Glucose readings/averages from meter download by by time of day for 2 weeks   PRE-MEAL Fasting Lunch Dinner Bedtime Overall  Glucose range: 125-170   93    Mean/median: 145    157   POST-MEAL PC Breakfast PC Lunch PC Dinner  Glucose range:   152-221  Mean/median:      Prior    PRE-MEAL Fasting Lunch Dinner Bedtime Overall  Glucose range: 102-189 89-127   89-189  Mean/median: 146    140   POST-MEAL PC Breakfast PC Lunch PC Dinner  Glucose range:   ?  Mean/median:        Dietician visit, most recent: 08/12/2018  Weight history:  Wt Readings from Last 3 Encounters:  02/12/22 166 lb 9.6 oz (75.6 kg)  01/03/22 165 lb (74.8 kg)  12/06/21 164 lb 11.2 oz (74.7 kg)    Glycemic control:   Lab Results  Component Value Date   HGBA1C 7.2 (A) 02/12/2022   HGBA1C 8.0 (A) 09/10/2021   HGBA1C 7.3 (H) 04/12/2021   Lab Results  Component Value Date   MICROALBUR <0.7 11/12/2021   LDLCALC 111 (H) 01/03/2022   CREATININE 1.11 02/12/2022   Lab Results  Component Value Date   MICRALBCREAT 1.6 11/12/2021    Lab Results  Component Value Date   FRUCTOSAMINE 309 (H) 06/18/2021   FRUCTOSAMINE 267 11/22/2018   FRUCTOSAMINE  349 (H) 08/18/2018    Office Visit on 02/12/2022  Component Date Value Ref Range Status   Hemoglobin A1C 02/12/2022 7.2 (A)  4.0 - 5.6 % Final   Sodium 02/12/2022 136  135 - 145 mEq/L Final   Potassium 02/12/2022 4.5  3.5 - 5.1 mEq/L Final   Chloride 02/12/2022 98  96 - 112 mEq/L Final   CO2 02/12/2022 29  19 - 32 mEq/L Final   Glucose, Bld 02/12/2022 145 (H)  70 - 99 mg/dL Final   BUN 75/17/0017 23  6 - 23 mg/dL Final   Creatinine, Ser 02/12/2022 1.11  0.40 - 1.20 mg/dL Final   GFR 49/44/9675 46.36 (L)  >60.00 mL/min Final   Calculated using the CKD-EPI Creatinine Equation (2021)   Calcium 02/12/2022 9.8  8.4 - 10.5 mg/dL Final   Direct LDL 91/63/8466 110.0  mg/dL Final   Optimal:  <599 mg/dLNear or Above Optimal:  100-129 mg/dLBorderline High:  130-159 mg/dLHigh:  160-189 mg/dLVery High:  >190 mg/dL    Allergies as of 08/12/7015       Reactions   Penicillins Nausea And Vomiting, Rash   Has patient had  a PCN reaction causing immediate rash, facial/tongue/throat swelling, SOB or lightheadedness with hypotension: Unknown Has patient had a PCN reaction causing severe rash involving mucus membranes or skin necrosis: Unknown Has patient had a PCN reaction that required hospitalization: Unknown Has patient had a PCN reaction occurring within the last 10 years: No If all of the above answers are "NO", then may proceed with Cephalosporin use.        Medication List        Accurate as of February 12, 2022  8:42 PM. If you have any questions, ask your nurse or doctor.          Align 4 MG Caps Take 1 capsule by mouth daily.   B-D UF III MINI PEN NEEDLES 31G X 5 MM Misc Generic drug: Insulin Pen Needle USE 3 PEN NEEDLES  DAILY TO INJECT INSULIN   cetirizine 10 MG tablet Commonly known as: ZYRTEC Take 10 mg by mouth daily as needed for allergies or rhinitis.   empagliflozin 25 MG Tabs tablet Commonly known as: Jardiance Take 1 tablet (25 mg total) by mouth daily before breakfast. What changed: how much to take Changed by: Reather Littler, MD   fenofibrate 145 MG tablet Commonly known as: TRICOR Take 1 tablet by mouth once daily   fluconazole 150 MG tablet Commonly known as: DIFLUCAN Take 1 tablet (150 mg total) by mouth once for 1 dose. Started by: Reather Littler, MD   HumaLOG KwikPen 100 UNIT/ML KwikPen Generic drug: insulin lispro INJECT 4 UNITS SUBCUTANEOUSLY TWICE DAILY BEFORE MEAL(S) What changed: See the new instructions.   hydrochlorothiazide 25 MG tablet Commonly known as: HYDRODIURIL Take 1/2 (one-half) tablet by mouth once daily   imipramine 50 MG tablet Commonly known as: TOFRANIL Take 1 tablet (50 mg total) by mouth at bedtime.   Lantus SoloStar 100 UNIT/ML Solostar Pen Generic drug: insulin glargine INJECT 26UNITS SUBCUTANEOUSLY ONCE DAILY   levothyroxine 88 MCG tablet Commonly known as: SYNTHROID TAKE 1 TABLET BY MOUTH ONCE DAILY BEFORE BREAKFAST    lisinopril 20 MG tablet Commonly known as: ZESTRIL Take 1 tablet (20 mg total) by mouth daily.   metFORMIN 750 MG 24 hr tablet Commonly known as: GLUCOPHAGE-XR Take 1 tablet by mouth twice daily   methylphenidate 20 MG tablet Commonly known as: RITALIN Take 0.5 tablets (10 mg total)  by mouth 3 (three) times daily with meals.   OneTouch Delica Lancets 30G Misc USE 1  TO CHECK GLUCOSE ONCE DAILY   OneTouch Verio test strip Generic drug: glucose blood USE 1 STRIP TO CHECK GLUCOSE ONCE DAILY   polyethylene glycol 17 g packet Commonly known as: MIRALAX / GLYCOLAX Take 17 g by mouth daily as needed.   simvastatin 80 MG tablet Commonly known as: ZOCOR TAKE 1 TABLET BY MOUTH AT BEDTIME   traMADol 50 MG tablet Commonly known as: Ultram Take 1 tablet (50 mg total) by mouth every 6 (six) hours as needed.        Allergies:  Allergies  Allergen Reactions   Penicillins Nausea And Vomiting and Rash    Has patient had a PCN reaction causing immediate rash, facial/tongue/throat swelling, SOB or lightheadedness with hypotension: Unknown Has patient had a PCN reaction causing severe rash involving mucus membranes or skin necrosis: Unknown Has patient had a PCN reaction that required hospitalization: Unknown Has patient had a PCN reaction occurring within the last 10 years: No If all of the above answers are "NO", then may proceed with Cephalosporin use.     Past Medical History:  Diagnosis Date   History of ischemic colitis 05/2018   Hyperlipidemia, mixed    Hypersomnia, persistent    Hypertension    Hypothyroidism    endocrinologist--- dr Lucianne Muss   Migraine    Narcolepsy with cataplexy    followed by neurologist--- dr dohmeier   Subcutaneous mass    left shoulder   Type 2 diabetes mellitus treated with insulin [facility])    endocrinologist--- dr a. Lucianne Muss   (12-02-2021  per pt check blood sugar daily 2 hours after evening meal)   Wears glasses    Wears hearing aid in both ears      Past Surgical History:  Procedure Laterality Date   APPENDECTOMY  1948   BIOPSY  05/24/2018   Procedure: BIOPSY;  Surgeon: Lemar Lofty., MD;  Location: [facility] ENDOSCOPY;  Service: Gastroenterology;;   COLONOSCOPY N/A 05/24/2018   Procedure: COLONOSCOPY;  Surgeon: Lemar Lofty., MD;  Location: [facility] ENDOSCOPY;  Service: Gastroenterology;  Laterality: N/A;   MASS EXCISION Left 12/06/2021   Procedure: LEFT SHOULDER SUBCUTANEOUS MASS EXCISION;  Surgeon: Quentin Ore, MD;  Location: [facility];  Service: General;  Laterality: Left;   POSTERIOR CERVICAL FUSION/FORAMINOTOMY  02/05/1975   C5-C6-C7   VAGINAL HYSTERECTOMY  1971    Family History  Problem Relation Age of Onset   Heart disease Mother        MI   Peripheral vascular disease Mother    Hyperlipidemia Mother    Depression Brother        suicide   Diabetes Brother    Diabetes Brother    Sleep apnea Brother    Sleep apnea Son    Diabetes Son    Heart attack Son    Sudden Cardiac Death Son     Social History:  reports that she quit smoking about 41 years ago. Her smoking use included cigarettes. She has never used smokeless tobacco. She reports that she does not drink alcohol and does not use drugs.   Review of Systems   Lipid history: Has been treated by her PCP with simvastatin 80 mg Has had persistently high triglycerides Last labs were nonfasting but LDL is higher than usual, she says she is cutting back on cheeses   Lab Results  Component Value Date   CHOL  213 (H) 01/03/2022   HDL 62 01/03/2022   LDLCALC 111 (H) 01/03/2022   LDLDIRECT 110.0 02/12/2022   TRIG 303 (H) 01/03/2022   CHOLHDL 3.4 01/03/2022           Hypertension: Has been treated with HCTZ, 12.5 mg lisinopril 20 mg by her PCP  Blood pressure is higher today   BP Readings from Last 3 Encounters:  02/12/22 (!) 142/86  01/03/22 138/72  12/06/21 (!) 134/57   Renal function history:  Lab Results   Component Value Date   CREATININE 1.11 02/12/2022   CREATININE 1.18 (H) 01/03/2022   CREATININE 1.20 (H) 12/06/2021    Most recent eye exam was in 8/20  Most recent foot exam: 9/23   Hypothyroidism: Taking 88 mcg of Euthyrox since 11/22 TSH history:   Lab Results  Component Value Date   TSH 3.32 06/18/2021   TSH 5.55 (H) 04/12/2021   TSH 2.42 10/18/2020   FREET4 0.78 04/12/2021   FREET4 0.85 10/18/2020   FREET4 0.95 12/27/2019     Physical Examination:  BP (!) 142/86   Pulse 100   Ht 5\' 2"  (1.575 m)   Wt 166 lb 9.6 oz (75.6 kg)   SpO2 99%   BMI 30.47 kg/m   Diabetic Foot Exam - Simple   Simple Foot Form Diabetic Foot exam was performed with the following findings: Yes   Visual Inspection No deformities, no ulcerations, no other skin breakdown bilaterally: Yes Sensation Testing Intact to touch and monofilament testing bilaterally: Yes Pulse Check Posterior Tibialis and Dorsalis pulse intact bilaterally: Yes Comments Decreased left dorsalis pedis No edema       ASSESSMENT:  Diabetes type 2   See history of present illness for detailed discussion of current diabetes management, blood sugar patterns and problems identified  Her A1c is improved at 7.2, previously 8%  Current regimen: Basal bolus insulin, metformin  HYPERTENSION: Blood pressure is high but not consistently May be higher from stopping Jardiance   Hypothyroidism: Her TSH will be rechecked on the next visit  LIPIDS: Poorly controlled despite simvastatin, may benefit from Crestor  Foot exam is unremarkable  PLAN:    No change in insulin regimen as yet She is willing to try Jardiance again and if she gets candidiasis she will use Diflucan which was also prescribed Also to reduce her because she can try half of the 25 mg tablet She will try to improve her diet consistently and cut back on portions of high fat foods when eating out and add more salads   We will check LDL and  forward results to PCP for further action if needed Follow-up in 3 months  Patient Instructions  Take 1/2 of 25mg  Jardiance    Total visit time including counseling = 30 minutes     02/12/2022, 8:42 PM   Note: This office note was prepared with Dragon voice recognition system technology. Any transcriptional errors that result from this process are unintentional.  LDL 110, likely can do better with Crestor 20 to 40 mg instead of simvastatin, forwarded to Dr. Reather Littler

## 2022-02-12 NOTE — Patient Instructions (Signed)
Take 1/2 of 25mg  Jardiance

## 2022-02-18 ENCOUNTER — Other Ambulatory Visit: Payer: Self-pay | Admitting: Family Medicine

## 2022-02-19 ENCOUNTER — Telehealth: Payer: Self-pay | Admitting: Family Medicine

## 2022-02-19 DIAGNOSIS — E1169 Type 2 diabetes mellitus with other specified complication: Secondary | ICD-10-CM

## 2022-02-19 MED ORDER — ROSUVASTATIN CALCIUM 20 MG PO TABS: 20.0000 mg | ORAL_TABLET | Freq: Every day | ORAL | 3 refills | Status: DC

## 2022-02-19 NOTE — Telephone Encounter (Signed)
I sent crestor 20 mg  One pill daily in evening  Alert [place] if any problems or side eff Try to eat low cholesterol diet   Re check fasting lab for lipid and ast and alt in 4-6 weeks please

## 2022-02-19 NOTE — Telephone Encounter (Signed)
-----   Message from Shon Millet, [place] sent at 02/19/2022  2:51 PM EDT ----- Pt notified of Dr. Royden Purl comments. Pt is okay trying Crestor. Will stop simvastatin and start crestor. Please send in Rx to [facility]

## 2022-02-21 NOTE — Telephone Encounter (Signed)
Left VM requesting pt to call and schedule fasting lab appt in 4-6 weeks

## 2022-03-05 ENCOUNTER — Other Ambulatory Visit (HOSPITAL_COMMUNITY): Payer: Self-pay

## 2022-03-17 ENCOUNTER — Other Ambulatory Visit: Payer: Self-pay | Admitting: Family Medicine

## 2022-03-19 ENCOUNTER — Other Ambulatory Visit: Payer: Self-pay | Admitting: Endocrinology

## 2022-04-21 ENCOUNTER — Encounter: Payer: Self-pay | Admitting: Family Medicine

## 2022-04-21 ENCOUNTER — Ambulatory Visit: Payer: Medicare Other | Admitting: Family Medicine

## 2022-04-21 VITALS — BP 128/62 | HR 101 | Temp 98.5°F | Ht 62.0 in | Wt 165.6 lb

## 2022-04-21 DIAGNOSIS — J0101 Acute recurrent maxillary sinusitis: Secondary | ICD-10-CM | POA: Diagnosis not present

## 2022-04-21 DIAGNOSIS — J301 Allergic rhinitis due to pollen: Secondary | ICD-10-CM

## 2022-04-21 DIAGNOSIS — J309 Allergic rhinitis, unspecified: Secondary | ICD-10-CM | POA: Insufficient documentation

## 2022-04-21 MED ORDER — DOXYCYCLINE HYCLATE 100 MG PO TABS: 100.0000 mg | ORAL_TABLET | Freq: Two times a day (BID) | ORAL | 0 refills | Status: DC

## 2022-04-21 NOTE — Patient Instructions (Signed)
For sinus infection  Take the doxycycline twice daily for a week    If the head pain does not get better please let us know (we may need to consider a different type of headache)   Continue zyrtec  Also steroid nasal spray like flonase  Saline nasal spray is helpful also  All over the counter   Try to stay out of the pollen when you can

## 2022-04-21 NOTE — Assessment & Plan Note (Signed)
With inc allergy symptoms this fall  Now congestion and R sided sinus pain  Reassuring exam Px doxycycline (is pcn all) Disc symptm care Allergy care- zyrtec and flonase  Inst to call if not improved (consider w/u of R sided headache if this does not resolve-

## 2022-04-21 NOTE — Assessment & Plan Note (Signed)
Worse recently causing sinusitis Enc her to continue steroid ns like flonase in season  Zyrtec prn Avoid allergens   Nasal saline may help as well in season

## 2022-04-21 NOTE — Progress Notes (Signed)
Subjective:    Patient ID: Amanda SparrowBrenda C Varnell, female    DOB: [birth year], 81 y.o.   MRN: [id number]  HPI Pt presents with sinus symptoms   Wt Readings from Last 3 Encounters:  04/21/22 165 lb 9.6 oz (75.1 kg)  02/12/22 166 lb 9.6 oz (75.6 kg)  01/03/22 165 lb (74.8 kg)   30.29 kg/m  Some allergy issues/ fall/pollen /esp if she tries to work outside  Starbucks Corporationakes zyrtec   Ears bother her, worse on the right  No hearing change  Wears hearing aide and false teeth- they bother it more   R sided facial/head pain  Side of her head  Worse when she coughs   Nasal congestion /stays stopped up   Coughs up some phlegm  Is not totally clear  No blood   Some wheezing at night   Pulse ox 98%  Patient Active Problem List   Diagnosis Date Noted   Allergic rhinitis 04/21/2022   Encounter for screening mammogram for breast cancer 01/03/2022   Grief reaction 02/04/2021   Family history of sudden cardiac death 02/04/2021   Localized swelling, mass and lump, neck 02/04/2021   History of ischemic colitis    Constipation    Routine general medical examination at a health care facility 01/31/2016   Narcolepsy with cataplexy 10/31/2014   Acute sinusitis 08/15/2014   Encounter for Medicare annual wellness exam 09/22/2013   Hypersomnia, persistent 07/15/2013   Colon cancer screening 12/31/2011   Obesity (BMI 30-39.9) 07/01/2011   ANXIETY 05/06/2010   DERMATOPHYTOSIS OF NAIL 12/21/2008   Hypothyroidism 09/02/2006   Diabetes type 2, uncontrolled 09/02/2006   Hyperlipidemia associated with type 2 diabetes mellitus (HCC) 09/02/2006   NARCOLEPSY W/CATAPLEXY 09/02/2006   Essential hypertension 09/02/2006   PNEUMONIA, HX OF 09/02/2006   Past Medical History:  Diagnosis Date   History of ischemic colitis 05/2018   Hyperlipidemia, mixed    Hypersomnia, persistent    Hypertension    Hypothyroidism    endocrinologist--- dr Lucianne Musskumar   Migraine    Narcolepsy with cataplexy    followed by  neurologist--- dr dohmeier   Subcutaneous mass    left shoulder   Type 2 diabetes mellitus treated with insulin [facility](HCC)    endocrinologist--- dr a. Lucianne Musskumar   (12-02-2021  per pt check blood sugar daily 2 hours after evening meal)   Wears glasses    Wears hearing aid in both ears    Past Surgical History:  Procedure Laterality Date   APPENDECTOMY  1948   BIOPSY  05/24/2018   Procedure: BIOPSY;  Surgeon: Lemar LoftyMansouraty, Gabriel Jr., MD;  [facility];  Service: Gastroenterology;;   COLONOSCOPY N/A 05/24/2018   Procedure: COLONOSCOPY;  Surgeon: Lemar LoftyMansouraty, Gabriel Jr., MD;  [facility];  Service: Gastroenterology;  Laterality: N/A;   MASS EXCISION Left 12/06/2021   Procedure: LEFT SHOULDER SUBCUTANEOUS MASS EXCISION;  Surgeon: Quentin OreStechschulte, Paul J, MD;  Location: [facility];  Service: General;  Laterality: Left;   POSTERIOR CERVICAL FUSION/FORAMINOTOMY  02/05/1975   C5-C6-C7   VAGINAL HYSTERECTOMY  1971   Social History   Tobacco Use   Smoking status: Former    Years: 10.00    Types: Cigarettes    Quit date: 1982    Years since quitting: 41.8   Smokeless tobacco: Never  Vaping Use   Vaping Use: Never used  Substance Use Topics   Alcohol use: No    Alcohol/week: 0.0 standard drinks of alcohol   Drug use: Never  Family History  Problem Relation Age of Onset   Heart disease Mother        MI   Peripheral vascular disease Mother    Hyperlipidemia Mother    Depression Brother        suicide   Diabetes Brother    Diabetes Brother    Sleep apnea Brother    Sleep apnea Son    Diabetes Son    Heart attack Son    Sudden Cardiac Death Son    Allergies  Allergen Reactions   Penicillins Nausea And Vomiting and Rash    Has patient had a PCN reaction causing immediate rash, facial/tongue/throat swelling, SOB or lightheadedness with hypotension: Unknown Has patient had a PCN reaction causing severe rash involving mucus membranes or skin necrosis:  Unknown Has patient had a PCN reaction that required hospitalization: Unknown Has patient had a PCN reaction occurring within the last 10 years: No If all of the above answers are "NO", then may proceed with Cephalosporin use.    Current Outpatient Medications on File Prior to Visit  Medication Sig Dispense Refill   B-D UF III MINI PEN NEEDLES 31G X 5 MM MISC USE 3 PEN NEEDLES  DAILY TO INJECT INSULIN 100 each 3   cetirizine (ZYRTEC) 10 MG tablet Take 10 mg by mouth daily as needed for allergies or rhinitis.      empagliflozin (JARDIANCE) 25 MG TABS tablet Take 1 tablet (25 mg total) by mouth daily before breakfast. 30 tablet 3   fenofibrate (TRICOR) 145 MG tablet Take 1 tablet by mouth once daily 90 tablet 0   HUMALOG KWIKPEN 100 UNIT/ML KwikPen INJECT 4 UNITS SUBCUTANEOUSLY TWICE DAILY BEFORE MEAL(S) 15 mL 0   hydrochlorothiazide (HYDRODIURIL) 25 MG tablet Take 1/2 (one-half) tablet by mouth once daily 45 tablet 2   imipramine (TOFRANIL) 50 MG tablet Take 1 tablet (50 mg total) by mouth at bedtime. 90 tablet 3   LANTUS SOLOSTAR 100 UNIT/ML Solostar Pen INJECT 26UNITS SUBCUTANEOUSLY ONCE DAILY 30 mL 1   lisinopril (ZESTRIL) 20 MG tablet Take 1 tablet by mouth once daily 90 tablet 2   metFORMIN (GLUCOPHAGE-XR) 750 MG 24 hr tablet Take 1 tablet by mouth twice daily 180 tablet 0   methylphenidate (RITALIN) 20 MG tablet Take 0.5 tablets (10 mg total) by mouth 3 (three) times daily with meals. 135 tablet 0   OneTouch Delica Lancets 30G MISC USE 1  TO CHECK GLUCOSE ONCE DAILY 100 each 0   ONETOUCH VERIO test strip USE 1 STRIP TO CHECK GLUCOSE ONCE DAILY 100 each 2   polyethylene glycol (MIRALAX / GLYCOLAX) packet Take 17 g by mouth daily as needed. 14 each 0   Probiotic Product (ALIGN) 4 MG CAPS Take 1 capsule by mouth daily.     rosuvastatin (CRESTOR) 20 MG tablet Take 1 tablet (20 mg total) by mouth daily. 90 tablet 3   traMADol (ULTRAM) 50 MG tablet Take 1 tablet (50 mg total) by mouth every 6  (six) hours as needed. 10 tablet 0   levothyroxine (SYNTHROID) 88 MCG tablet TAKE 1 TABLET BY MOUTH ONCE DAILY BEFORE BREAKFAST (Patient not taking: Reported on 04/21/2022) 90 tablet 2   No current facility-administered medications on file prior to visit.     Review of Systems  Constitutional:  Negative for activity change, appetite change, fatigue, fever and unexpected weight change.  HENT:  Positive for congestion, ear pain, hearing loss, postnasal drip, rhinorrhea and sinus pressure. Negative for ear discharge, facial  swelling, sore throat and voice change.   Eyes:  Negative for pain, redness and visual disturbance.  Respiratory:  Positive for cough. Negative for shortness of breath and wheezing.   Cardiovascular:  Negative for chest pain and palpitations.  Gastrointestinal:  Negative for abdominal pain, blood in stool, constipation and diarrhea.  Endocrine: Negative for polydipsia and polyuria.  Genitourinary:  Negative for dysuria, frequency and urgency.  Musculoskeletal:  Negative for arthralgias, back pain and myalgias.  Skin:  Negative for pallor and rash.  Allergic/Immunologic: Negative for environmental allergies.  Neurological:  Positive for headaches. Negative for dizziness, syncope and facial asymmetry.  Hematological:  Negative for adenopathy. Does not bruise/bleed easily.  Psychiatric/Behavioral:  Negative for decreased concentration and dysphoric mood. The patient is not nervous/anxious.        Objective:   Physical Exam Constitutional:      General: She is not in acute distress.    Appearance: Normal appearance. She is well-developed. She is obese. She is not ill-appearing.  HENT:     Head: Normocephalic and atraumatic.     Comments: Nares are injected and congested  Clear pnd   Bilateral frontal and L sided maxillary sinus tenderness  Mild temporal tenderness on L  No TMJ tenderness      Right Ear: Tympanic membrane and external ear normal.     Left Ear:  Tympanic membrane, ear canal and external ear normal.     Ears:     Comments: TMs are dull but clear     Nose: Congestion and rhinorrhea present.     Mouth/Throat:     Mouth: Mucous membranes are moist.     Pharynx: No oropharyngeal exudate or posterior oropharyngeal erythema.     Comments: Clear pnd Eyes:     General:        Right eye: No discharge.        Left eye: No discharge.     Conjunctiva/sclera: Conjunctivae normal.     Pupils: Pupils are equal, round, and reactive to light.  Cardiovascular:     Rate and Rhythm: Normal rate and regular rhythm.  Pulmonary:     Effort: Pulmonary effort is normal. No respiratory distress.     Breath sounds: Normal breath sounds. No stridor. No wheezing, rhonchi or rales.     Comments: No wheeze Musculoskeletal:     Cervical back: Normal range of motion and neck supple.  Lymphadenopathy:     Cervical: No cervical adenopathy.  Skin:    General: Skin is warm and dry.     Findings: No rash.  Neurological:     Mental Status: She is alert.     Cranial Nerves: No cranial nerve deficit.  Psychiatric:        Mood and Affect: Mood normal.           Assessment & Plan:   Problem List Items Addressed This Visit       Respiratory   Acute sinusitis - Primary    With inc allergy symptoms this fall  Now congestion and R sided sinus pain  Reassuring exam Px doxycycline (is pcn all) Disc symptm care Allergy care- zyrtec and flonase  Inst to call if not improved (consider w/u of R sided headache if this does not resolve-      Relevant Medications   doxycycline (VIBRA-TABS) 100 MG tablet   Allergic rhinitis    Worse recently causing sinusitis Enc her to continue steroid ns like flonase in season  Zyrtec prn Avoid  allergens   Nasal saline may help as well in season

## 2022-04-22 ENCOUNTER — Other Ambulatory Visit: Payer: Self-pay | Admitting: Endocrinology

## 2022-04-22 DIAGNOSIS — E11649 Type 2 diabetes mellitus with hypoglycemia without coma: Secondary | ICD-10-CM

## 2022-05-06 ENCOUNTER — Telehealth: Payer: Self-pay | Admitting: Neurology

## 2022-05-06 ENCOUNTER — Other Ambulatory Visit: Payer: Self-pay | Admitting: Neurology

## 2022-05-06 DIAGNOSIS — G47411 Narcolepsy with cataplexy: Secondary | ICD-10-CM

## 2022-05-06 DIAGNOSIS — G4710 Hypersomnia, unspecified: Secondary | ICD-10-CM

## 2022-05-06 MED ORDER — METHYLPHENIDATE HCL 20 MG PO TABS: 10.0000 mg | ORAL_TABLET | Freq: Three times a day (TID) | ORAL | 0 refills | Status: DC

## 2022-05-06 MED ORDER — IMIPRAMINE HCL 50 MG PO TABS: 50.0000 mg | ORAL_TABLET | Freq: Every day | ORAL | 2 refills | Status: DC

## 2022-05-06 NOTE — Telephone Encounter (Signed)
See other refill encounter from today. This is duplicate.

## 2022-05-06 NOTE — Telephone Encounter (Signed)
Pt called stating that the medications that she requested to be called in for her this morning will be needing to be called in to the [facility] and not the [facility]. Please resend.

## 2022-05-06 NOTE — Telephone Encounter (Signed)
E-scribed im[facility]ramine refill to [facility].   Sent refill for methylphenidate to [facility],NP to e-scribe. Per drug registry, last refilled 12/26/21 #135. Last seen 11/28/21 and next f/u 20/27/24.

## 2022-05-06 NOTE — Telephone Encounter (Addendum)
From Jael Armstrong/phone room: "Pt called stating that the medications that she requested to be called in for her this morning will be needing to be called in to the [facility] and not the [facility]. Please resend. "  [facility] and spoke w/ tech. Cx rx imipramine and methylphenidate sent today.  Resent imipramine to [facility]. Sent to MM,NP to e-scribe methylphenidate to [facility].

## 2022-05-06 NOTE — Addendum Note (Signed)
Addended by: Arther Abbott on: 05/06/2022 01:18 PM   Modules accepted: Orders

## 2022-05-06 NOTE — Telephone Encounter (Signed)
Pt called needing a refill request for her imipramine (TOFRANIL) 50 MG tablet and her methylphenidate (RITALIN) 20 MG tablet [facility] on [address]

## 2022-05-08 ENCOUNTER — Other Ambulatory Visit (INDEPENDENT_AMBULATORY_CARE_PROVIDER_SITE_OTHER): Payer: Medicare Other

## 2022-05-08 DIAGNOSIS — E785 Hyperlipidemia, unspecified: Secondary | ICD-10-CM

## 2022-05-08 DIAGNOSIS — E1169 Type 2 diabetes mellitus with other specified complication: Secondary | ICD-10-CM | POA: Diagnosis not present

## 2022-05-08 LAB — LIPID PANEL
Cholesterol: 171 mg/dL (ref 0–200)
HDL: 63 mg/dL (ref >39.00)
LDL Cholesterol: 69 mg/dL (ref 0–99)
NonHDL: 107.87
Total CHOL/HDL Ratio: 3
Triglycerides: 196 mg/dL — ABNORMAL HIGH (ref 0.0–149.0)
VLDL: 39.2 mg/dL (ref 0.0–40.0)

## 2022-05-08 LAB — AST: AST: 26 U/L (ref 0–37)

## 2022-05-08 LAB — ALT: ALT: 27 U/L (ref 0–35)

## 2022-05-09 ENCOUNTER — Encounter: Payer: Self-pay | Admitting: *Deleted

## 2022-05-22 ENCOUNTER — Other Ambulatory Visit: Payer: Self-pay | Admitting: Endocrinology

## 2022-06-11 ENCOUNTER — Encounter: Payer: Self-pay | Admitting: Family Medicine

## 2022-06-11 ENCOUNTER — Ambulatory Visit (INDEPENDENT_AMBULATORY_CARE_PROVIDER_SITE_OTHER): Payer: BLUE CROSS/BLUE SHIELD | Admitting: Family Medicine

## 2022-06-11 ENCOUNTER — Other Ambulatory Visit: Payer: Self-pay | Admitting: Endocrinology

## 2022-06-11 ENCOUNTER — Ambulatory Visit (INDEPENDENT_AMBULATORY_CARE_PROVIDER_SITE_OTHER)
Admission: RE | Admit: 2022-06-11 | Discharge: 2022-06-11 | Disposition: A | Discharge status: Home / Self Care | Payer: BLUE CROSS/BLUE SHIELD | Source: Ambulatory Visit | Attending: Family Medicine | Admitting: Family Medicine

## 2022-06-11 VITALS — BP 146/70 | HR 104 | Temp 97.5°F | Ht 62.0 in | Wt 166.0 lb

## 2022-06-11 DIAGNOSIS — E1165 Type 2 diabetes mellitus with hyperglycemia: Secondary | ICD-10-CM

## 2022-06-11 DIAGNOSIS — R058 Other specified cough: Secondary | ICD-10-CM | POA: Insufficient documentation

## 2022-06-11 DIAGNOSIS — R059 Cough, unspecified: Secondary | ICD-10-CM | POA: Diagnosis not present

## 2022-06-11 DIAGNOSIS — J9811 Atelectasis: Secondary | ICD-10-CM | POA: Diagnosis not present

## 2022-06-11 DIAGNOSIS — E063 Autoimmune thyroiditis: Secondary | ICD-10-CM

## 2022-06-11 MED ORDER — DOXYCYCLINE HYCLATE 100 MG PO TABS: 100.0000 mg | ORAL_TABLET | Freq: Two times a day (BID) | ORAL | 0 refills | Status: DC

## 2022-06-11 MED ORDER — BENZONATATE 200 MG PO CAPS: 200.0000 mg | ORAL_CAPSULE | Freq: Three times a day (TID) | ORAL | 1 refills | Status: DC | PRN

## 2022-06-11 MED ORDER — PREDNISONE 10 MG PO TABS: ORAL_TABLET | ORAL | 0 refills | Status: DC

## 2022-06-11 NOTE — Assessment & Plan Note (Signed)
For several weeks  Some focal wheeze in R lower lung field but cxr notes L base atelectasis  Will cover for poss bact bronchitis with doxycycline, prednisone Tessalon for cough  ER precautions rev Disc sympt care Update if not starting to improve in a week or if worsening

## 2022-06-11 NOTE — Progress Notes (Signed)
Subjective:    Patient ID: Amanda King, female    DOB: 05-Mar-1941, 82 y.o.   MRN: [id number]  HPI Pt presents for c/o cough   Wt Readings from Last 3 Encounters:  06/11/22 166 lb (75.3 kg)  04/21/22 165 lb 9.6 oz (75.1 kg)  02/12/22 166 lb 9.6 oz (75.6 kg)   30.36 kg/m  Vitals:   06/11/22 0957  BP: (!) 146/70  Pulse: (!) 104  Temp: (!) 97.5 F (36.4 C)  TempSrc: Temporal  SpO2: 96%  Weight: 166 lb (75.3 kg)  Height: 5\' 2"  (1.575 m)    Coughing  Started before xmas  Never had much nasal symptoms (besides a little sinus drainage)  Prod cough - mucous is grey/yellow to brown  Worse at night   No fever  No chills  No body aches Throat is scratchy from cough  Ears feels stopped up   Some headache and chest soreness from cough  No wheezing  Occ feels sob   Otc: Robitussin  Propped up when sleeping  Mucinex DM now    She does take lisinopril for HTN    Last pna vaccine was 2018  DG Chest 2 View  Result Date: 06/11/2022 CLINICAL DATA:  Cough EXAM: CHEST - 2 VIEW COMPARISON:  CXR 05/17/18 FINDINGS: No pleural effusion. No pneumothorax. Left basilar atelectasis. Normal cardiac and mediastinal contours. No displaced rib fractures. Visualized upper abdomen is unremarkable. Degenerative changes of the bilateral AC joints. IMPRESSION: Left basilar atelectasis. Electronically Signed   By: Marin Roberts M.D.   On: 06/11/2022 10:41     Patient Active Problem List   Diagnosis Date Noted   Productive cough 06/11/2022   Allergic rhinitis 04/21/2022   Encounter for screening mammogram for breast cancer 01/03/2022   Grief reaction Feb 12, 2021   Family history of sudden cardiac death 2021/02/12   Localized swelling, mass and lump, neck 2021/02/12   History of ischemic colitis    Constipation    Routine general medical examination at a health care facility 01/31/2016   Narcolepsy with cataplexy 10/31/2014   Acute sinusitis 08/15/2014   Encounter for Medicare annual  wellness exam 09/22/2013   Hypersomnia, persistent 07/15/2013   Colon cancer screening 12/31/2011   Obesity (BMI 30-39.9) 07/01/2011   ANXIETY 05/06/2010   DERMATOPHYTOSIS OF NAIL 12/21/2008   Hypothyroidism 09/02/2006   Diabetes type 2, uncontrolled 09/02/2006   Hyperlipidemia associated with type 2 diabetes mellitus ([place]) 09/02/2006   NARCOLEPSY W/CATAPLEXY 09/02/2006   Essential hypertension 09/02/2006   PNEUMONIA, HX OF 09/02/2006   Past Medical History:  Diagnosis Date   History of ischemic colitis 05/2018   Hyperlipidemia, mixed    Hypersomnia, persistent    Hypertension    Hypothyroidism    endocrinologist--- dr Dwyane Dee   Migraine    Narcolepsy with cataplexy    followed by neurologist--- dr dohmeier   Subcutaneous mass    left shoulder   Type 2 diabetes mellitus treated with insulin [facility])    endocrinologist--- dr a. Dwyane Dee   (12-02-2021  per pt check blood sugar daily 2 hours after evening meal)   Wears glasses    Wears hearing aid in both ears    Past Surgical History:  Procedure Laterality Date   [place]   BIOPSY  05/24/2018   Procedure: BIOPSY;  Surgeon: Irving Copas., MD;  Location: [facility];  Service: Gastroenterology;;   COLONOSCOPY N/A 05/24/2018   Procedure: COLONOSCOPY;  Surgeon: Irving Copas., MD;  Location: [facility]  ENDOSCOPY;  Service: Gastroenterology;  Laterality: N/A;   MASS EXCISION Left 12/06/2021   Procedure: LEFT SHOULDER SUBCUTANEOUS MASS EXCISION;  Surgeon: Quentin Ore, MD;  Location: [facility];  Service: General;  Laterality: Left;   POSTERIOR CERVICAL FUSION/FORAMINOTOMY  02/05/1975   C5-C6-C7   VAGINAL HYSTERECTOMY  1971   Social History   Tobacco Use   Smoking status: Former    Years: 10.00    Types: Cigarettes    Quit date: 1982    Years since quitting: 42.0   Smokeless tobacco: Never  Vaping Use   Vaping Use: Never used  Substance Use Topics   Alcohol use: No    Alcohol/week:  0.0 standard drinks of alcohol   Drug use: Never   Family History  Problem Relation Age of Onset   Heart disease Mother        MI   Peripheral vascular disease Mother    Hyperlipidemia Mother    Depression Brother        suicide   Diabetes Brother    Diabetes Brother    Sleep apnea Brother    Sleep apnea Son    Diabetes Son    Heart attack Son    Sudden Cardiac Death Son    Allergies  Allergen Reactions   Penicillins Nausea And Vomiting and Rash    Has patient had a PCN reaction causing immediate rash, facial/tongue/throat swelling, SOB or lightheadedness with hypotension: Unknown Has patient had a PCN reaction causing severe rash involving mucus membranes or skin necrosis: Unknown Has patient had a PCN reaction that required hospitalization: Unknown Has patient had a PCN reaction occurring within the last 10 years: No If all of the above answers are "NO", then may proceed with Cephalosporin use.    Current Outpatient Medications on File Prior to Visit  Medication Sig Dispense Refill   cetirizine (ZYRTEC) 10 MG tablet Take 10 mg by mouth daily as needed for allergies or rhinitis.      empagliflozin (JARDIANCE) 25 MG TABS tablet Take 1 tablet (25 mg total) by mouth daily before breakfast. 30 tablet 3   fenofibrate (TRICOR) 145 MG tablet Take 1 tablet by mouth once daily 90 tablet 0   HUMALOG KWIKPEN 100 UNIT/ML KwikPen INJECT 4 UNITS SUBCUTANEOUSLY TWICE DAILY BEFORE MEAL(S) 15 mL 0   hydrochlorothiazide (HYDRODIURIL) 25 MG tablet Take 1/2 (one-half) tablet by mouth once daily 45 tablet 2   imipramine (TOFRANIL) 50 MG tablet Take 1 tablet (50 mg total) by mouth at bedtime. 90 tablet 2   Insulin Pen Needle (B-D UF III MINI PEN NEEDLES) 31G X 5 MM MISC USE 1  THREE TIMES DAILY TO  INJECT  INSULIN 100 each 0   LANTUS SOLOSTAR 100 UNIT/ML Solostar Pen INJECT 26UNITS SUBCUTANEOUSLY ONCE DAILY 30 mL 1   levothyroxine (SYNTHROID) 88 MCG tablet TAKE 1 TABLET BY MOUTH ONCE DAILY BEFORE  BREAKFAST 90 tablet 2   lisinopril (ZESTRIL) 20 MG tablet Take 1 tablet by mouth once daily 90 tablet 2   metFORMIN (GLUCOPHAGE-XR) 750 MG 24 hr tablet Take 1 tablet by mouth twice daily 180 tablet 0   methylphenidate (RITALIN) 20 MG tablet Take 0.5 tablets (10 mg total) by mouth 3 (three) times daily with meals. 135 tablet 0   OneTouch Delica Lancets 30G MISC USE 1  TO CHECK GLUCOSE ONCE DAILY 100 each 0   ONETOUCH VERIO King strip USE 1 STRIP TO CHECK GLUCOSE ONCE DAILY 100 each 2   polyethylene  glycol (MIRALAX / GLYCOLAX) packet Take 17 g by mouth daily as needed. 14 each 0   Probiotic Product (ALIGN) 4 MG CAPS Take 1 capsule by mouth daily.     rosuvastatin (CRESTOR) 20 MG tablet Take 1 tablet (20 mg total) by mouth daily. 90 tablet 3   traMADol (ULTRAM) 50 MG tablet Take 1 tablet (50 mg total) by mouth every 6 (six) hours as needed. 10 tablet 0   No current facility-administered medications on file prior to visit.    Review of Systems  Constitutional:  Negative for activity change, appetite change, fatigue, fever and unexpected weight change.  HENT:  Positive for postnasal drip. Negative for congestion, ear pain, rhinorrhea, sinus pressure and sore throat.   Eyes:  Negative for pain, redness and visual disturbance.  Respiratory:  Positive for cough. Negative for shortness of breath, wheezing and stridor.   Cardiovascular:  Negative for chest pain and palpitations.  Gastrointestinal:  Negative for abdominal pain, blood in stool, constipation and diarrhea.  Endocrine: Negative for polydipsia and polyuria.  Genitourinary:  Negative for dysuria, frequency and urgency.  Musculoskeletal:  Negative for arthralgias, back pain and myalgias.  Skin:  Negative for pallor and rash.  Allergic/Immunologic: Negative for environmental allergies.  Neurological:  Negative for dizziness, syncope and headaches.  Hematological:  Negative for adenopathy. Does not bruise/bleed easily.   Psychiatric/Behavioral:  Negative for decreased concentration and dysphoric mood. The patient is not nervous/anxious.        Objective:   Physical Exam Constitutional:      General: She is not in acute distress.    Appearance: Normal appearance. She is well-developed. She is obese. She is not ill-appearing, toxic-appearing or diaphoretic.  HENT:     Head: Normocephalic and atraumatic.     Comments: Nares are injected and congested      Right Ear: Tympanic membrane, ear canal and external ear normal.     Left Ear: Tympanic membrane, ear canal and external ear normal.     Nose:     Comments: Nares are boggy    Mouth/Throat:     Mouth: Mucous membranes are moist.     Pharynx: Oropharynx is clear. No oropharyngeal exudate or posterior oropharyngeal erythema.     Comments: Clear pnd  Eyes:     General:        Right eye: No discharge.        Left eye: No discharge.     Conjunctiva/sclera: Conjunctivae normal.     Pupils: Pupils are equal, round, and reactive to light.  Cardiovascular:     Rate and Rhythm: Tachycardia present.     Heart sounds: Normal heart sounds.  Pulmonary:     Effort: Pulmonary effort is normal. No respiratory distress.     Breath sounds: No stridor. Wheezing, rhonchi and rales present.     Comments: Scattered rhonchi More focal wheezes in R lower lung field with few rales   Not sob with speech or exertion  Chest:     Chest wall: No tenderness.  Musculoskeletal:     Cervical back: Normal range of motion and neck supple.  Lymphadenopathy:     Cervical: No cervical adenopathy.  Skin:    General: Skin is warm and dry.     Capillary Refill: Capillary refill takes less than 2 seconds.     Findings: No rash.  Neurological:     Mental Status: She is alert.     Cranial Nerves: No cranial nerve deficit.  Psychiatric:  Mood and Affect: Mood normal.           Assessment & Plan:   Problem List Items Addressed This Visit       Other    Productive cough - Primary    For several weeks  Some focal wheeze in R lower lung field but cxr notes L base atelectasis  Will cover for poss bact bronchitis with doxycycline, prednisone Tessalon for cough  ER precautions rev Disc sympt care Update if not starting to improve in a week or if worsening         Relevant Orders   DG Chest 2 View (Completed)

## 2022-06-11 NOTE — Patient Instructions (Signed)
Let's check a chest xray today   Continue the mucinex DM for cough and chest congestion  Drink a lot of fluids  If your symptoms suddenly worsen or you have trouble breathing go to the ER   We will call you about the chest xray  Then I will send medication to your pharmacy (prednisone and an antibiotic)   Prednisone will make you feel hyper and hungry , it will cause your blood sugar to go up temporarily   Update if not starting to improve in a week or if worsening

## 2022-06-13 ENCOUNTER — Ambulatory Visit: Payer: Medicare Other | Admitting: Endocrinology

## 2022-06-13 ENCOUNTER — Other Ambulatory Visit: Payer: Medicare Other

## 2022-06-16 ENCOUNTER — Ambulatory Visit: Payer: Medicare Other | Admitting: Neurology

## 2022-06-30 ENCOUNTER — Other Ambulatory Visit: Payer: Self-pay | Admitting: Endocrinology

## 2022-07-19 ENCOUNTER — Other Ambulatory Visit: Payer: Self-pay | Admitting: Endocrinology

## 2022-07-19 DIAGNOSIS — E1165 Type 2 diabetes mellitus with hyperglycemia: Secondary | ICD-10-CM

## 2022-07-25 ENCOUNTER — Other Ambulatory Visit: Payer: Self-pay | Admitting: Endocrinology

## 2022-07-25 DIAGNOSIS — E11649 Type 2 diabetes mellitus with hypoglycemia without coma: Secondary | ICD-10-CM

## 2022-08-05 ENCOUNTER — Ambulatory Visit: Payer: Medicare Other | Admitting: Neurology

## 2022-08-05 ENCOUNTER — Encounter: Payer: Self-pay | Admitting: Neurology

## 2022-08-05 VITALS — BP 155/72 | HR 98 | Ht 62.0 in | Wt 164.0 lb

## 2022-08-05 DIAGNOSIS — R0683 Snoring: Secondary | ICD-10-CM | POA: Insufficient documentation

## 2022-08-05 DIAGNOSIS — G47411 Narcolepsy with cataplexy: Secondary | ICD-10-CM | POA: Diagnosis not present

## 2022-08-05 DIAGNOSIS — E65 Localized adiposity: Secondary | ICD-10-CM | POA: Diagnosis not present

## 2022-08-05 DIAGNOSIS — G4710 Hypersomnia, unspecified: Secondary | ICD-10-CM

## 2022-08-05 DIAGNOSIS — Z9189 Other specified personal risk factors, not elsewhere classified: Secondary | ICD-10-CM | POA: Diagnosis not present

## 2022-08-05 MED ORDER — METHYLPHENIDATE HCL 20 MG PO TABS: 10.0000 mg | ORAL_TABLET | Freq: Three times a day (TID) | ORAL | 0 refills | Status: DC

## 2022-08-05 MED ORDER — IMIPRAMINE HCL 50 MG PO TABS: 50.0000 mg | ORAL_TABLET | Freq: Every day | ORAL | 3 refills | Status: DC

## 2022-08-05 NOTE — Patient Instructions (Signed)
Screening for Sleep Apnea  Sleep apnea is a condition in which breathing pauses or becomes shallow during sleep. Sleep apnea screening is a test to determine if you are at risk for sleep apnea. The test includes a series of questions. It will only takes a few minutes. Your health care provider may ask you to have this test in preparation for surgery or as part of a physical exam. What are the symptoms of sleep apnea? Common symptoms of sleep apnea include: Snoring. Waking up often at night. Daytime sleepiness. Pauses in breathing. Choking or gasping during sleep. Irritability. Forgetfulness. Trouble thinking clearly. Depression. Personality changes. Most people with sleep apnea do not know that they have it. What are the advantages of sleep apnea screening? Getting screened for sleep apnea can help: Ensure your safety. It is important for your health care providers to know whether or not you have sleep apnea, especially if you are having surgery or have other long-term (chronic) health conditions. Improve your health and allow you to get a better night's rest. Restful sleep can help you: Have more energy. Lose weight. Improve high blood pressure. Improve diabetes management. Prevent stroke. Prevent car accidents. What happens during the screening? Screening usually includes being asked a list of questions about your sleep quality. Some questions you may be asked include: Do you snore? Is your sleep restless? Do you have daytime sleepiness? Has a partner or spouse told you that you stop breathing during sleep? Have you had trouble concentrating or memory loss? What is your age? What is your neck circumference? To measure your neck, keep your back straight and gently wrap the tape measure around your neck. Put the tape measure at the middle of your neck, between your chin and collarbone. What is your sex assigned at birth? Do you have or are you being treated for high blood  pressure? If your screening test is positive, you are at risk for the condition. Further testing may be needed to confirm a diagnosis of sleep apnea. Where to find more information You can find screening tools online or at your health care clinic. For more information about sleep apnea screening and healthy sleep, visit these websites: Centers for Disease Control and Prevention: www.cdc.gov American Sleep Apnea Association: www.sleepapnea.org Contact a health care provider if: You think that you may have sleep apnea. Summary Sleep apnea screening can help determine if you are at risk for sleep apnea. It is important for your health care providers to know whether or not you have sleep apnea, especially if you are having surgery or have other chronic health conditions. You may be asked to take a screening test for sleep apnea in preparation for surgery or as part of a physical exam. This information is not intended to replace advice given to you by your health care provider. Make sure you discuss any questions you have with your health care provider. Document Revised: 05/04/2020 Document Reviewed: 05/04/2020 Elsevier Patient Education  2023 [employer]  

## 2022-08-05 NOTE — Progress Notes (Signed)
SLEEP CLINIC    Provider:  Larey Seat, MD  Primary Care Physician:  Tower, Wynelle Fanny, MD [place] [address]     Referring Provider: Abner Greenspan, [place],  [place] [id number]          Chief Complaint according to patient   Patient presents with:     New Patient (Initial Visit)           HISTORY OF PRESENT ILLNESS:   Chief concern according to patient :  " I have a lot of congestion and was given prednisone and now my sugar and BP is messed up".  She was treated for maxillary sinuitis according to Dr Alba Cory notes.   Amanda King is a 82 y.o. female patient who is here for revisit 08/05/2022 for  Narcolepsy/ cataplexy.    08-05-2022: Amanda King is seen here in a regular scheduled revisit. Her speech is slightly dysarthric , which she attributes to ill fitting dentures and she is presenting with high blood pressure.  No headaches, no Vertigo. She reports a cataplexy spell on the anniversary of her sons death, 07/25/22. He died in September 05, 2020. She has still all his belongings in their old place, a has not emptied his wardrobe etc.  Her husband died 20 years ago.  She reports her medication works well, and she is not interested in changing anything. She is familiar with the medication and she sleeps well.  She may take a power nap. She goes to bed sometime between 10-11 and sleeps 6-7 hours . Her dog wakes her up.       11-28-2021: Amanda King is a 82 y.o. female seen here as a revisit  from Dr. Glori Bickers for follow up of symptoms of Hypersomnia, RV on 11-28-2021, patient here followed for 10 years for hypersomnia/ clinical and MSLT confirmed as narcolepsy with cataplexy. She is able to use her medication according to her day's demands. She keeps her own house, mows the lawn, gardening. She is regularly checking BP and glucose at home. She has a left eye ptosis.  She is scheduled for  shoulder cyst surgery on  6-30  with Louanna Raw ,MD, and will have general anaesthesia. She presents with elevated BP 150/74 mmHg.Marland Kitchen  Epworth Sleepiness score: 6/ 24 points. FSS at 12 points, Geriatric  Depression score :12/15 BMI 30.    07-15-2013: Amanda King is a 82 y.o. female  Is seen here as a referral/ revisit  from Dr. Glori Bickers for follow up of symptoms of  Hypersomnia,   I had last seen Amanda. Jaspers in 06-Sep-2011 after she was referred to me with a diagnosis of narcolepsy with cataplexy. She had been treated on generic imipramine and Ritalin 50 mg of imipramine at night and 20 mg of methylphenidate 4 times daily. In  2011/09/06 she described that in spite of the medication she had a cataplectic attack related to the emotionally upsetting news  of her granddaughter's father-in-law's death.  She reported rare sleep hallucinations and had been followed by Dr. Morrell Riddle , Dr. Earley Favor  and was Dr. Tressia Danas patient until his retirement.  The patient states that the narcolepsy was diagnosed  after 09/06/87 with cataplectic attacks.;  she was unsure of the was any viral onset and that she has no family history of narcolepsy but at the time underwent a neck surgery with myelogram and somehow  Remained  always sleepier since that time.  She reported ongoing cataplectic spells whenever she tried to change dosing of her medication - she had been using Tofranil as the brand name to sleep  but was due to costs and insurance coverage forced to change to generic imipramine, which triggered additional cataplectic attacks.  We have discussed Xyrem in our last visit, but postponed the initiation as she felt that her current regimen was sufficient.       Review of Systems: Out of a complete 14 system review, the patient complains of only the following symptoms, and all other reviewed systems are negative.:  Fatigue, sleepiness , snoring, fragmented sleep,  How likely are you to doze in the following situations: 0 = not likely, 1 = slight chance, 2 =  moderate chance, 3 = high chance   Sitting and Reading? Watching Television? Sitting inactive in a public place (theater or meeting)? As a passenger in a car for an hour without a break? Lying down in the afternoon when circumstances permit? Sitting and talking to someone? Sitting quietly after lunch without alcohol? In a car, while stopped for a few minutes in traffic?   Total = 8/ 24 points   FSS endorsed at 15/ 63 points.   On ritalin and tofranil.   GDS : not filled.    Social History   Socioeconomic History   Marital status: Widowed, was married at 83, she became a grandmother at 31.    Spouse name: Not on file   Number of children: 3   Years of education: GED   Highest education level: Not on file  Occupational History   Occupation:  Retired from [employer], retail.   Tobacco Use   Smoking status: Former    Years: 10.00    Types: Cigarettes    Quit date: 1982    Years since quitting: 42.1   Smokeless tobacco: Never  Vaping Use   Vaping Use: Never used  Substance and Sexual Activity   Alcohol use: No    Alcohol/week: 0.0 standard drinks of alcohol   Drug use: Never   Sexual activity: Not on file  Other Topics Concern   Not on file  Social History Narrative   Patient is widowed.  Walks for exercise.  Patient has three children. Her daughter lives close, her son died in August 24, 2020.  She has greatgreat- grandchildren. Patient is right-handed.Patient drinks three cups of caffeine daily.  Patient is now fully retired.Patient has a GED.   Social Determinants of Health   Financial Resource Strain: Low Risk  (09/17/2021)   Overall Financial Resource Strain (CARDIA)    Difficulty of Paying Living Expenses: Not hard at all  Food Insecurity: No Food Insecurity (09/17/2021)   Hunger Vital Sign    Worried About Running Out of Food in the Last Year: Never true    Ran Out of Food in the Last Year: Never true  Transportation Needs: No Transportation Needs (09/17/2021)    PRAPARE - Hydrologist (Medical): No    Lack of Transportation (Non-Medical): No  Physical Activity: Insufficiently Active (09/17/2021)   Exercise Vital Sign    Days of Exercise per Week: 3 days    Minutes of Exercise per Session: 20 min  Stress: No Stress Concern Present (09/17/2021)   [place]    Feeling of Stress : Not at all  Social Connections: Moderately Isolated (09/17/2021)   Social Connection and Isolation Panel [  NHANES]    Frequency of Communication with Friends and Family: More than three times a week    Frequency of Social Gatherings with Friends and Family: More than three times a week    Attends Religious Services: 1 to 4 times per year    Active Member of [employer] or Organizations: No    Attends Archivist Meetings: Never    Marital Status: Widowed    Family History  Problem Relation Age of Onset   Heart disease Mother        MI   Peripheral vascular disease Mother    Hyperlipidemia Mother    Depression Brother        suicide   Diabetes Brother    Diabetes Brother    Sleep apnea Brother    Sleep apnea Son    Diabetes Son    Heart attack Son    Sudden Cardiac Death Son     Past Medical History:  Diagnosis Date   History of ischemic colitis 05/2018   Hyperlipidemia, mixed    Hypersomnia, persistent    Hypertension    Hypothyroidism    endocrinologist--- dr Dwyane Dee   Migraine    Narcolepsy with cataplexy    followed by sleep specialist/ neurologist--- Dr Kassidi Elza   Subcutaneous mass    left shoulder   Type 2 diabetes mellitus treated with insulin [facility])    endocrinologist--- dr a. Dwyane Dee   (12-02-2021  per pt check blood sugar daily 2 hours after evening meal)   Wears glasses    Wears hearing aid in both ears     Past Surgical History:  Procedure Laterality Date   [place]  05/24/2018   Procedure: BIOPSY;  Surgeon: Irving Copas., MD;  Location: [facility];  Service: Gastroenterology;;   COLONOSCOPY N/A 05/24/2018   Procedure: COLONOSCOPY;  Surgeon: Irving Copas., MD;  Location: [facility] ENDOSCOPY;  Service: Gastroenterology;  Laterality: N/A;   MASS EXCISION Left 12/06/2021   Procedure: LEFT SHOULDER SUBCUTANEOUS MASS EXCISION;  Surgeon: Felicie Morn, MD;  Location: [facility];  Service: General;  Laterality: Left;   POSTERIOR CERVICAL FUSION/FORAMINOTOMY  02/05/1975   C5-C6-C7   VAGINAL HYSTERECTOMY  1971     Current Outpatient Medications on File Prior to Visit  Medication Sig Dispense Refill   benzonatate (TESSALON) 200 MG capsule Take 1 capsule (200 mg total) by mouth 3 (three) times daily as needed for cough. Swallow whole 30 capsule 1   cetirizine (ZYRTEC) 10 MG tablet Take 10 mg by mouth daily as needed for allergies or rhinitis.      doxycycline (VIBRA-TABS) 100 MG tablet Take 1 tablet (100 mg total) by mouth 2 (two) times daily. 14 tablet 0   empagliflozin (JARDIANCE) 25 MG TABS tablet Take 1 tablet (25 mg total) by mouth daily before breakfast. 30 tablet 3   fenofibrate (TRICOR) 145 MG tablet Take 1 tablet by mouth once daily 90 tablet 0   HUMALOG KWIKPEN 100 UNIT/ML KwikPen INJECT 4 UNITS SUBCUTANEOUSLY TWICE DAILY BEFORE MEAL(S) 15 mL 0   hydrochlorothiazide (HYDRODIURIL) 25 MG tablet Take 1/2 (one-half) tablet by mouth once daily 45 tablet 2   imipramine (TOFRANIL) 50 MG tablet Take 1 tablet (50 mg total) by mouth at bedtime. 90 tablet 2   Insulin Pen Needle (B-D UF III MINI PEN NEEDLES) 31G X 5 MM MISC USE ONE THREE TIMES DAILY TO INJECT INSULIN 100 each 1   LANTUS SOLOSTAR 100 UNIT/ML  Solostar Pen INJECT 26 UNITS SUBCUTANEOUSLY ONCE DAILY 15 mL 0   levothyroxine (SYNTHROID) 88 MCG tablet TAKE 1 TABLET BY MOUTH ONCE DAILY BEFORE BREAKFAST 90 tablet 1   lisinopril (ZESTRIL) 20 MG tablet Take 1 tablet by mouth once daily 90 tablet 2   metFORMIN (GLUCOPHAGE-XR) 750  MG 24 hr tablet Take 1 tablet by mouth twice daily 180 tablet 0   methylphenidate (RITALIN) 20 MG tablet Take 0.5 tablets (10 mg total) by mouth 3 (three) times daily with meals. 135 tablet 0   OneTouch Delica Lancets 99991111 MISC USE 1  TO CHECK GLUCOSE ONCE DAILY 100 each 0   ONETOUCH VERIO test strip USE 1 STRIP TO CHECK GLUCOSE ONCE DAILY 100 each 2   polyethylene glycol (MIRALAX / GLYCOLAX) packet Take 17 g by mouth daily as needed. 14 each 0   predniSONE (DELTASONE) 10 MG tablet Take 3 pills once daily by mouth for 3 days, then 2 pills once daily for 3 days, then 1 pill once daily for 3 days and then stop 18 tablet 0   Probiotic Product (ALIGN) 4 MG CAPS Take 1 capsule by mouth daily.     rosuvastatin (CRESTOR) 20 MG tablet Take 1 tablet (20 mg total) by mouth daily. 90 tablet 3   traMADol (ULTRAM) 50 MG tablet Take 1 tablet (50 mg total) by mouth every 6 (six) hours as needed. 10 tablet 0   No current facility-administered medications on file prior to visit.    Allergies  Allergen Reactions   Penicillins Nausea And Vomiting and Rash    Has patient had a PCN reaction causing immediate rash, facial/tongue/throat swelling, SOB or lightheadedness with hypotension: Unknown Has patient had a PCN reaction causing severe rash involving mucus membranes or skin necrosis: Unknown Has patient had a PCN reaction that required hospitalization: Unknown Has patient had a PCN reaction occurring within the last 10 years: No If all of the above answers are "NO", then may proceed with Cephalosporin use.      DIAGNOSTIC DATA (LABS, IMAGING, TESTING) - I reviewed patient records, labs, notes, testing and imaging myself where available.  Lab Results  Component Value Date   WBC 9.0 01/03/2022   HGB 14.6 01/03/2022   HCT 42.1 01/03/2022   MCV 86.3 01/03/2022   PLT 338 01/03/2022      Component Value Date/Time   NA 136 02/12/2022 1143   K 4.5 02/12/2022 1143   CL 98 02/12/2022 1143   CO2 29  02/12/2022 1143   GLUCOSE 145 (H) 02/12/2022 1143   BUN 23 02/12/2022 1143   CREATININE 1.11 02/12/2022 1143   CREATININE 1.18 (H) 01/03/2022 1615   CALCIUM 9.8 02/12/2022 1143   PROT 7.5 01/03/2022 1615   ALBUMIN 4.7 11/12/2021 1156   AST 26 05/08/2022 0916   ALT 27 05/08/2022 0916   ALKPHOS 54 11/12/2021 1156   BILITOT 0.4 01/03/2022 1615   GFRNONAA >60 05/24/2018 0454   GFRAA >60 05/24/2018 0454   Lab Results  Component Value Date   CHOL 171 05/08/2022   HDL 63.00 05/08/2022   LDLCALC 69 05/08/2022   LDLDIRECT 110.0 02/12/2022   TRIG 196.0 (H) 05/08/2022   CHOLHDL 3 05/08/2022   Lab Results  Component Value Date   HGBA1C 7.2 (A) 02/12/2022   No results found for: "VITAMINB12" Lab Results  Component Value Date   TSH 3.32 06/18/2021    PHYSICAL EXAM:  There were no vitals filed for this visit. There is no height or  weight on file to calculate BMI.   Wt Readings from Last 3 Encounters:  06/11/22 166 lb (75.3 kg)  04/21/22 165 lb 9.6 oz (75.1 kg)  02/12/22 166 lb 9.6 oz (75.6 kg)     Ht Readings from Last 3 Encounters:  06/11/22 '5\' 2"'$  (1.575 m)  04/21/22 '5\' 2"'$  (1.575 m)  02/12/22 '5\' 2"'$  (1.575 m)      General: The patient is awake, alert and appears not in acute distress. The patient is groomed. Head: Normocephalic, atraumatic. Neck is supple. Mallampati 3,  neck circumference:15.5 inches . Nasal airflow patent.  Left sinus congestion and often pain, pressure. Draining . Retrognathia is  seen.  Dental status:  dentures Cardiovascular:  Regular rate and cardiac rhythm by pulse,  without distended neck veins. Respiratory: Lungs with crackles.  Skin:  With evidence of ankle edema, . Trunk: The patient's posture is erect.   NEUROLOGIC EXAM: The patient is awake and alert, oriented to place and time.   Memory subjective described as intact.  Attention span & concentration ability appears normal.  Speech is fluent,  with  dysarthria.  Mood and affect are  appropriate.   Cranial nerves: no loss of smell or taste reported  Pupils are equal and briskly reactive to light. Funduscopic exam deferred.  Extraocular movements in vertical and horizontal planes were intact and without nystagmus.  No Diplopia. Visual fields by finger perimetry are intact. Hearing  with hearing aid.   Facial sensation intact to fine touch.  Facial motor strength is symmetric, left ptosis is long standing.  her tongue moves midline.  Neck ROM : rotation, tilt and flexion extension were normal for age and shoulder shrug was symmetrical.    Motor exam:  Symmetric bulk, tone and ROM.   Normal tone without cog- wheeling, symmetric grip strength .   Sensory: deferred.  Pain in right had thumb and index finger, arthritic.    Coordination: Rapid alternating movements in the fingers/hands were of normal speed.  The Finger-to-nose maneuver was intact without evidence of ataxia, dysmetria or tremor.   Gait and station: deferred.  Deep tendon reflexes: deferred.   ASSESSMENT AND PLAN 82 y.o. year old female  here with:    1) controlled EDS in the context of narcolepsy with cataplexy- on tofranil and ritalin. Will refill.   2) recent steroid treatments have raised BP and Glucose level. This will normalize over the next weeks, followed by PCP>   3) No EDS but dry mouth, edentulous, and confessed to snoring- she is obese and she has anatomical risk factors as well. I like to check her for OSA.  I will order a HST.    Will  follow up through our NP within 4- 5 months after HST .   I would like to thank Tower, Wynelle Fanny, MD and [place], [place], [place] [address] for allowing me to meet with and to take care of this pleasant patient.   CC: I will share my notes with PCP.  After spending a total time of  32  minutes face to face and additional time for physical and neurologic examination, review of laboratory studies,  personal review of imaging  studies, reports and results of other testing and review of referral information / records as far as provided in visit,   Electronically signed by: Larey Seat, MD 08/05/2022 9:25 AM  [facility] Board certified by The [employer] of Sleep  Medicine and Diplomate of the American Academy of Sleep Medicine. Board certified In Neurology through the [place], Fellow of the [employer] of Neurology. Medical Director of [employer] [facility].

## 2022-08-28 ENCOUNTER — Other Ambulatory Visit (INDEPENDENT_AMBULATORY_CARE_PROVIDER_SITE_OTHER): Payer: Medicare Other

## 2022-08-28 DIAGNOSIS — E063 Autoimmune thyroiditis: Secondary | ICD-10-CM | POA: Diagnosis not present

## 2022-08-28 DIAGNOSIS — E1165 Type 2 diabetes mellitus with hyperglycemia: Secondary | ICD-10-CM

## 2022-08-28 DIAGNOSIS — Z794 Long term (current) use of insulin: Secondary | ICD-10-CM

## 2022-08-28 LAB — TSH: TSH: 2.33 u[IU]/mL (ref 0.35–5.50)

## 2022-08-28 LAB — BASIC METABOLIC PANEL
BUN: 27 mg/dL — ABNORMAL HIGH (ref 6–23)
CO2: 30 mEq/L (ref 19–32)
Calcium: 9.6 mg/dL (ref 8.4–10.5)
Chloride: 99 mEq/L (ref 96–112)
Creatinine, Ser: 1.07 mg/dL (ref 0.40–1.20)
GFR: 48.27 mL/min — ABNORMAL LOW (ref >60.00)
Glucose, Bld: 122 mg/dL — ABNORMAL HIGH (ref 70–99)
Potassium: 4.2 mEq/L (ref 3.5–5.1)
Sodium: 137 mEq/L (ref 135–145)

## 2022-08-28 LAB — HEMOGLOBIN A1C: Hgb A1c MFr Bld: 8.2 % — ABNORMAL HIGH (ref 4.6–6.5)

## 2022-09-01 ENCOUNTER — Encounter: Payer: Self-pay | Admitting: Endocrinology

## 2022-09-01 ENCOUNTER — Other Ambulatory Visit: Payer: Self-pay | Admitting: Endocrinology

## 2022-09-01 ENCOUNTER — Ambulatory Visit: Payer: Medicare Other | Admitting: Endocrinology

## 2022-09-01 VITALS — BP 120/74 | HR 103 | Ht 62.0 in | Wt 163.6 lb

## 2022-09-01 DIAGNOSIS — E1165 Type 2 diabetes mellitus with hyperglycemia: Secondary | ICD-10-CM

## 2022-09-01 DIAGNOSIS — I10 Essential (primary) hypertension: Secondary | ICD-10-CM | POA: Diagnosis not present

## 2022-09-01 DIAGNOSIS — Z794 Long term (current) use of insulin: Secondary | ICD-10-CM | POA: Diagnosis not present

## 2022-09-01 DIAGNOSIS — E063 Autoimmune thyroiditis: Secondary | ICD-10-CM

## 2022-09-01 MED ORDER — FLUCONAZOLE 150 MG PO TABS: 150.0000 mg | ORAL_TABLET | Freq: Once | ORAL | 0 refills | Status: AC

## 2022-09-01 MED ORDER — ONETOUCH VERIO VI STRP: ORAL_STRIP | 2 refills | Status: DC

## 2022-09-01 MED ORDER — BD PEN NEEDLE MINI U/F 31G X 5 MM MISC: 1 refills | Status: DC

## 2022-09-01 NOTE — Progress Notes (Addendum)
Patient ID: Amanda King, female   DOB: 1941/04/06, 82 y.o.   MRN: [id number]           Reason for Appointment: Follow-up for Type 2 Diabetes  Referring PCP: Dr. Milinda Antis   History of Present Illness:          Date of diagnosis of type 2 diabetes mellitus:  2008      Background history:  She was initially treated with metformin and subsequently glipizide added Januvia was added in 12/2016 She has had variable control with A1c as high as 10.3 done in 2015 and as low as 7.3 done in April 2016 Has generally been followed by her PCP about twice a year  She was tried on [place] in 2020  Recent history:     Non-insulin hypoglycemic drugs: Metformin ER 750 mg twice daily,  Jardiance 25 mg daily  INSULIN doses: Lantus 24  units daily acs, Humalog 6 units at breakfast and supper  Her A1c is 8.2, higher   Current management, blood sugar patterns and problems identified: She did not bring her monitor for download and unclear what her blood sugars are She had prednisone in January and also was having a lot more stress, not following her diet and drinking more beer until recently At that time blood sugars will go up to 180-200 Now she thinks her blood sugars are about 120-130 most of the time even after meals She has taken her Humalog and Lantus regularly Jardiance was previously stopped because of the cost as also a yeast infection She however has gone back on taking this, starting with half tablet and now for about a month taking full tablet However she is now having a yeast infection Not able to do any exercise except recumbent bike at times Weight is about the same  Dinner 5-6 p.m. usually       Side effects from medications have been: Diarrhea from high dose metformin            Glucose monitoring:  done less than 1 times a day         Glucometer:  One Touch/ Prime      Blood Glucose readings/meter not downloaded   PRE-MEAL Fasting Lunch Dinner Bedtime Overall  Glucose  range: 120      Mean/median:        POST-MEAL PC Breakfast PC Lunch PC Dinner  Glucose range:   130  Mean/median:      Prior    PRE-MEAL Fasting Lunch Dinner Bedtime Overall  Glucose range: 125-170  93    Mean/median: 145    157   POST-MEAL PC Breakfast PC Lunch PC Dinner  Glucose range:   152-221  Mean/median:         Dietician visit, most recent: 08/12/2018  Weight history:  Wt Readings from Last 3 Encounters:  09/01/22 163 lb 9.6 oz (74.2 kg)  08/05/22 164 lb (74.4 kg)  06/11/22 166 lb (75.3 kg)    Glycemic control:   Lab Results  Component Value Date   HGBA1C 8.2 (H) 08/28/2022   HGBA1C 7.2 (A) 02/12/2022   HGBA1C 8.0 (A) 09/10/2021   Lab Results  Component Value Date   MICROALBUR <0.7 11/12/2021   LDLCALC 69 05/08/2022   CREATININE 1.07 08/28/2022   Lab Results  Component Value Date   MICRALBCREAT 1.6 11/12/2021    Lab Results  Component Value Date   FRUCTOSAMINE 309 (H) 06/18/2021   FRUCTOSAMINE 267 11/22/2018   FRUCTOSAMINE 349 (  H) 08/18/2018    Lab on 08/28/2022  Component Date Value Ref Range Status   TSH 08/28/2022 2.33  0.35 - 5.50 uIU/mL Final   Sodium 08/28/2022 137  135 - 145 mEq/L Final   Potassium 08/28/2022 4.2  3.5 - 5.1 mEq/L Final   Chloride 08/28/2022 99  96 - 112 mEq/L Final   CO2 08/28/2022 30  19 - 32 mEq/L Final   Glucose, Bld 08/28/2022 122 (H)  70 - 99 mg/dL Final   BUN 95/18/8416 27 (H)  6 - 23 mg/dL Final   Creatinine, Ser 08/28/2022 1.07  0.40 - 1.20 mg/dL Final   GFR 60/63/0160 48.27 (L)  >60.00 mL/min Final   Calculated using the CKD-EPI Creatinine Equation (2021)   Calcium 08/28/2022 9.6  8.4 - 10.5 mg/dL Final   Hgb F0X MFr Bld 08/28/2022 8.2 (H)  4.6 - 6.5 % Final   Glycemic Control Guidelines for People with Diabetes:Non Diabetic:  <6%Goal of Therapy: <7%Additional Action Suggested:  >8%     Allergies as of 09/01/2022       Reactions   Penicillins Nausea And Vomiting, Rash   Has patient had a PCN reaction  causing immediate rash, facial/tongue/throat swelling, SOB or lightheadedness with hypotension: Unknown Has patient had a PCN reaction causing severe rash involving mucus membranes or skin necrosis: Unknown Has patient had a PCN reaction that required hospitalization: Unknown Has patient had a PCN reaction occurring within the last 10 years: No If all of the above answers are "NO", then may proceed with Cephalosporin use.        Medication List        Accurate as of September 01, 2022 11:59 PM. If you have any questions, ask your nurse or doctor.          Align 4 MG Caps Take 1 capsule by mouth daily.   B-D UF III MINI PEN NEEDLES 31G X 5 MM Misc Generic drug: Insulin Pen Needle USE ONE THREE TIMES DAILY TO INJECT INSULIN What changed: You were already taking a medication with the same name, and this prescription was added. Make sure you understand how and when to take each. Changed by: Reather Littler, MD   B-D UF III MINI PEN NEEDLES 31G X 5 MM Misc Generic drug: Insulin Pen Needle USE 1  THREE TIMES DAILY TO  INJECT  INSULIN What changed: additional instructions Changed by: Reather Littler, MD   benzonatate 200 MG capsule Commonly known as: TESSALON Take 1 capsule (200 mg total) by mouth 3 (three) times daily as needed for cough. Swallow whole   cetirizine 10 MG tablet Commonly known as: ZYRTEC Take 10 mg by mouth daily as needed for allergies or rhinitis.   doxycycline 100 MG tablet Commonly known as: VIBRA-TABS Take 1 tablet (100 mg total) by mouth 2 (two) times daily.   empagliflozin 25 MG Tabs tablet Commonly known as: Jardiance Take 1 tablet (25 mg total) by mouth daily before breakfast.   fenofibrate 145 MG tablet Commonly known as: TRICOR Take 1 tablet by mouth once daily   fluconazole 150 MG tablet Commonly known as: DIFLUCAN Take 1 tablet (150 mg total) by mouth once for 1 dose. Started by: Reather Littler, MD   HumaLOG KwikPen 100 UNIT/ML KwikPen Generic drug:  insulin lispro INJECT 4 UNITS SUBCUTANEOUSLY TWICE DAILY BEFORE MEAL(S)   hydrochlorothiazide 25 MG tablet Commonly known as: HYDRODIURIL Take 1/2 (one-half) tablet by mouth once daily   imipramine 50 MG tablet Commonly known as:  TOFRANIL Take 1 tablet (50 mg total) by mouth at bedtime.   Lantus SoloStar 100 UNIT/ML Solostar Pen Generic drug: insulin glargine INJECT 26 UNITS SUBCUTANEOUSLY ONCE DAILY   levothyroxine 88 MCG tablet Commonly known as: SYNTHROID TAKE 1 TABLET BY MOUTH ONCE DAILY BEFORE BREAKFAST   lisinopril 20 MG tablet Commonly known as: ZESTRIL Take 1 tablet by mouth once daily   metFORMIN 750 MG 24 hr tablet Commonly known as: GLUCOPHAGE-XR Take 1 tablet by mouth twice daily   methylphenidate 20 MG tablet Commonly known as: RITALIN Take 0.5 tablets (10 mg total) by mouth 3 (three) times daily with meals.   OneTouch Delica Lancets 30G Misc USE 1  TO CHECK GLUCOSE ONCE DAILY   OneTouch Verio test strip Generic drug: glucose blood USE 1 STRIP TO CHECK GLUCOSE ONCE DAILY   polyethylene glycol 17 g packet Commonly known as: MIRALAX / GLYCOLAX Take 17 g by mouth daily as needed.   predniSONE 10 MG tablet Commonly known as: DELTASONE Take 3 pills once daily by mouth for 3 days, then 2 pills once daily for 3 days, then 1 pill once daily for 3 days and then stop   rosuvastatin 20 MG tablet Commonly known as: Crestor Take 1 tablet (20 mg total) by mouth daily.   traMADol 50 MG tablet Commonly known as: Ultram Take 1 tablet (50 mg total) by mouth every 6 (six) hours as needed.        Allergies:  Allergies  Allergen Reactions   Penicillins Nausea And Vomiting and Rash    Has patient had a PCN reaction causing immediate rash, facial/tongue/throat swelling, SOB or lightheadedness with hypotension: Unknown Has patient had a PCN reaction causing severe rash involving mucus membranes or skin necrosis: Unknown Has patient had a PCN reaction that required  hospitalization: Unknown Has patient had a PCN reaction occurring within the last 10 years: No If all of the above answers are "NO", then may proceed with Cephalosporin use.     Past Medical History:  Diagnosis Date   History of ischemic colitis 05/2018   Hyperlipidemia, mixed    Hypersomnia, persistent    Hypertension    Hypothyroidism    endocrinologist--- dr Lucianne Muss   Migraine    Narcolepsy with cataplexy    followed by neurologist--- dr dohmeier   Subcutaneous mass    left shoulder   Type 2 diabetes mellitus treated with insulin [facility])    endocrinologist--- dr a. Lucianne Muss   (12-02-2021  per pt check blood sugar daily 2 hours after evening meal)   Wears glasses    Wears hearing aid in both ears     Past Surgical History:  Procedure Laterality Date   APPENDECTOMY  1948   BIOPSY  05/24/2018   Procedure: BIOPSY;  Surgeon: Lemar Lofty., MD;  Location: [facility] ENDOSCOPY;  Service: Gastroenterology;;   COLONOSCOPY N/A 05/24/2018   Procedure: COLONOSCOPY;  Surgeon: Lemar Lofty., MD;  Location: [facility] ENDOSCOPY;  Service: Gastroenterology;  Laterality: N/A;   MASS EXCISION Left 12/06/2021   Procedure: LEFT SHOULDER SUBCUTANEOUS MASS EXCISION;  Surgeon: Quentin Ore, MD;  Location: [facility];  Service: General;  Laterality: Left;   POSTERIOR CERVICAL FUSION/FORAMINOTOMY  02/05/1975   C5-C6-C7   VAGINAL HYSTERECTOMY  1971    Family History  Problem Relation Age of Onset   Heart disease Mother        MI   Peripheral vascular disease Mother    Hyperlipidemia Mother    Depression  Brother        suicide   Diabetes Brother    Diabetes Brother    Sleep apnea Brother    Sleep apnea Son    Diabetes Son    Heart attack Son    Sudden Cardiac Death Son     Social History:  reports that she quit smoking about 42 years ago. Her smoking use included cigarettes. She has never used smokeless tobacco. She reports that she does not drink alcohol and does  not use drugs.   Review of Systems   Lipid history: Has been treated by her PCP with statins Has had persistently high triglycerides    Lab Results  Component Value Date   CHOL 171 05/08/2022   HDL 63.00 05/08/2022   LDLCALC 69 05/08/2022   LDLDIRECT 110.0 02/12/2022   TRIG 196.0 (H) 05/08/2022   CHOLHDL 3 05/08/2022           Hypertension: Has been treated with HCTZ, 12.5 mg lisinopril 20 mg by her PCP  Blood pressure is improved today   BP Readings from Last 3 Encounters:  09/01/22 120/74  08/05/22 (!) 155/72  06/11/22 (!) 146/70   Renal function history:  Lab Results  Component Value Date   CREATININE 1.07 08/28/2022   CREATININE 1.11 02/12/2022   CREATININE 1.18 (H) 01/03/2022    Most recent eye exam was in 8/20  Most recent foot exam: 9/23   Hypothyroidism: Taking 88 mcg of levothyroxine since 11/22 Recently feels fairly good TSH history:   Lab Results  Component Value Date   TSH 2.33 08/28/2022   TSH 3.32 06/18/2021   TSH 5.55 (H) 04/12/2021   FREET4 0.78 04/12/2021   FREET4 0.85 10/18/2020   FREET4 0.95 12/27/2019     Physical Examination:  BP 120/74 (BP Location: Left Arm, Patient Position: Sitting, Cuff Size: Normal)   Pulse (!) 103   Ht 5\' 2"  (1.575 m)   Wt 163 lb 9.6 oz (74.2 kg)   SpO2 97%   BMI 29.92 kg/m      ASSESSMENT:  Diabetes type 2   See history of present illness for detailed discussion of current diabetes management, blood sugar patterns and problems identified  Her A1c is back up to 8.2, previously was at 7.2  Current regimen: Basal bolus insulin, metformin Recently she reports better blood sugars and lab fasting glucose: 122 However did not bring a monitor Blood sugars were higher until recently as discussed above She is able to go back on Jardiance but now has a yeast infection  HYPERTENSION: Blood pressure is improved May benefit from continuing Jardiance regularly also   Hypothyroidism: Her TSH is  normal  LIPIDS: Well-controlled except for triglycerides in the last visit, needs to continue statin regularly   PLAN:    No change in insulin regimen at this time She can try taking half tablet Jardiance to avoid yeast infections To bring monitor to her on the next visit for download Discussed timing of insulin doses She will need to adjust her basal insulin by 2 to 4 units at least if she is having higher readings from stress or steroids Regular exercise No change in levothyroxine  Follow-up in 3 months  There are no Patient Instructions on file for this visit.   Total visit time including counseling = 30 minutes     Reather Littler 09/02/2022, 10:16 AM   Note: This office note was prepared with Dragon voice recognition system technology. Any transcriptional errors that result from  this process are unintentional.  LDL 110, likely can do better with Crestor 20 to 40 mg instead of simvastatin, forwarded to Dr. Milinda Antis

## 2022-09-02 ENCOUNTER — Telehealth: Payer: Self-pay | Admitting: Neurology

## 2022-09-02 NOTE — Telephone Encounter (Signed)
Amanda King left a vmail on 08/28/22, the patient called back and left a voicemail on my phone today 09/02/22.  I called her back but no answer I left another voicemail for her to call back to schedule her HST.

## 2022-09-04 ENCOUNTER — Telehealth: Payer: Self-pay | Admitting: Family Medicine

## 2022-09-04 NOTE — Telephone Encounter (Signed)
Contacted Roanna Epley to schedule their annual wellness visit. Appointment made for 09/25/2022.  [place] Direct Dial: [phone]

## 2022-09-09 NOTE — Telephone Encounter (Signed)
Patient daughter Hilda Blades called back and ask I call her since the patient can't hear very well.  HST- [employer] medicare no auth req   Patient is scheduled at [facility] for 09/23/22 at 4 pm.  Mailed packet to the patient.

## 2022-09-17 ENCOUNTER — Other Ambulatory Visit: Payer: Self-pay | Admitting: Endocrinology

## 2022-09-17 DIAGNOSIS — E1165 Type 2 diabetes mellitus with hyperglycemia: Secondary | ICD-10-CM

## 2022-09-23 ENCOUNTER — Ambulatory Visit (INDEPENDENT_AMBULATORY_CARE_PROVIDER_SITE_OTHER): Payer: Medicare Other | Admitting: Neurology

## 2022-09-23 DIAGNOSIS — Z8241 Family history of sudden cardiac death: Secondary | ICD-10-CM

## 2022-09-23 DIAGNOSIS — G4733 Obstructive sleep apnea (adult) (pediatric): Secondary | ICD-10-CM | POA: Diagnosis not present

## 2022-09-23 DIAGNOSIS — R0683 Snoring: Secondary | ICD-10-CM

## 2022-09-23 DIAGNOSIS — Z9189 Other specified personal risk factors, not elsewhere classified: Secondary | ICD-10-CM

## 2022-09-23 DIAGNOSIS — G47411 Narcolepsy with cataplexy: Secondary | ICD-10-CM

## 2022-09-23 DIAGNOSIS — E65 Localized adiposity: Secondary | ICD-10-CM

## 2022-09-25 ENCOUNTER — Ambulatory Visit (INDEPENDENT_AMBULATORY_CARE_PROVIDER_SITE_OTHER): Payer: Medicare Other

## 2022-09-25 VITALS — Ht 62.0 in | Wt 162.0 lb

## 2022-09-25 DIAGNOSIS — Z0000 Encounter for general adult medical examination without abnormal findings: Secondary | ICD-10-CM | POA: Diagnosis not present

## 2022-09-25 NOTE — Patient Instructions (Signed)
[name] , Thank you for taking time to come for your Medicare Wellness Visit. I appreciate your ongoing commitment to your health goals. Please review the following plan we discussed and let me know if I can assist you in the future.   These are the goals we discussed:  Goals      Patient Stated     Starting 03/09/2018, I will continue to take medications as prescribed.      Patient Stated     03/31/2019, I will maintain and continue medications as prescribed.      Patient Stated     Continue current lifestyle and keep active        This is a list of the screening recommended for you and due dates:  Health Maintenance  Topic Date Due   Zoster (Shingles) Vaccine (1 of 2) Never done   Eye exam for diabetics  12/08/2019   DTaP/Tdap/Td vaccine (3 - Td or Tdap) 06/30/2021   COVID-19 Vaccine (3 - 2023-24 season) 02/07/2022   Yearly kidney health urinalysis for diabetes  11/13/2022   Flu Shot  01/08/2023   Complete foot exam   02/13/2023   Hemoglobin A1C  02/28/2023   Yearly kidney function blood test for diabetes  08/28/2023   Medicare Annual Wellness Visit  09/25/2023   Pneumonia Vaccine  Completed   DEXA scan (bone density measurement)  Completed   HPV Vaccine  Aged Out    Advanced directives: Please bring a copy of your health care power of attorney and living will to the office to be added to your chart at your convenience.   Conditions/risks identified: Aim for 30 minutes of exercise or brisk walking, 6-8 glasses of water, and 5 servings of fruits and vegetables each day.   Next appointment: Follow up in one year for your annual wellness visit 09/29/23 @ 10:45 televisit   Preventive Care 65 Years and Older, Female Preventive care refers to lifestyle choices and visits with your health care provider that can promote health and wellness. What does preventive care include? A yearly physical exam. This is also called an annual well check. Dental exams once or twice a  year. Routine eye exams. Ask your health care provider how often you should have your eyes checked. Personal lifestyle choices, including: Daily care of your teeth and gums. Regular physical activity. Eating a healthy diet. Avoiding tobacco and drug use. Limiting alcohol use. Practicing safe sex. Taking low-dose aspirin every day. Taking vitamin and mineral supplements as recommended by your health care provider. What happens during an annual well check? The services and screenings done by your health care provider during your annual well check will depend on your age, overall health, lifestyle risk factors, and family history of disease. Counseling  Your health care provider may ask you questions about your: Alcohol use. Tobacco use. Drug use. Emotional well-being. Home and relationship well-being. Sexual activity. Eating habits. History of falls. Memory and ability to understand (cognition). Work and work Astronomer. Reproductive health. Screening  You may have the following tests or measurements: Height, weight, and BMI. Blood pressure. Lipid and cholesterol levels. These may be checked every 5 years, or more frequently if you are over 48 years old. Skin check. Lung cancer screening. You may have this screening every year starting at age 38 if you have a 30-pack-year history of smoking and currently smoke or have quit within the past 15 years. Fecal occult blood test (FOBT) of the stool. You may have this  test every year starting at age 68. Flexible sigmoidoscopy or colonoscopy. You may have a sigmoidoscopy every 5 years or a colonoscopy every 10 years starting at age 18. Hepatitis C blood test. Hepatitis B blood test. Sexually transmitted disease (STD) testing. Diabetes screening. This is done by checking your blood sugar (glucose) after you have not eaten for a while (fasting). You may have this done every 1-3 years. Bone density scan. This is done to screen for  osteoporosis. You may have this done starting at age 54. Mammogram. This may be done every 1-2 years. Talk to your health care provider about how often you should have regular mammograms. Talk with your health care provider about your test results, treatment options, and if necessary, the need for more tests. Vaccines  Your health care provider may recommend certain vaccines, such as: Influenza vaccine. This is recommended every year. Tetanus, diphtheria, and acellular pertussis (Tdap, Td) vaccine. You may need a Td booster every 10 years. Zoster vaccine. You may need this after age 31. Pneumococcal 13-valent conjugate (PCV13) vaccine. One dose is recommended after age 85. Pneumococcal polysaccharide (PPSV23) vaccine. One dose is recommended after age 50. Talk to your health care provider about which screenings and vaccines you need and how often you need them. This information is not intended to replace advice given to you by your health care provider. Make sure you discuss any questions you have with your health care provider. Document Released: 06/22/2015 Document Revised: 02/13/2016 Document Reviewed: 03/27/2015 Elsevier Interactive Patient Education  2017 [place] Prevention in the Home Falls can cause injuries. They can happen to people of all ages. There are many things you can do to make your home safe and to help prevent falls. What can I do on the outside of my home? Regularly fix the edges of walkways and driveways and fix any cracks. Remove anything that might make you trip as you walk through a door, such as a raised step or threshold. Trim any bushes or trees on the path to your home. Use bright outdoor lighting. Clear any walking paths of anything that might make someone trip, such as rocks or tools. Regularly check to see if handrails are loose or broken. Make sure that both sides of any steps have handrails. Any raised decks and porches should have guardrails on  the edges. Have any leaves, snow, or ice cleared regularly. Use sand or salt on walking paths during winter. Clean up any spills in your garage right away. This includes oil or grease spills. What can I do in the bathroom? Use night lights. Install grab bars by the toilet and in the tub and shower. Do not use towel bars as grab bars. Use non-skid mats or decals in the tub or shower. If you need to sit down in the shower, use a plastic, non-slip stool. Keep the floor dry. Clean up any water that spills on the floor as soon as it happens. Remove soap buildup in the tub or shower regularly. Attach bath mats securely with double-sided non-slip rug tape. Do not have throw rugs and other things on the floor that can make you trip. What can I do in the bedroom? Use night lights. Make sure that you have a light by your bed that is easy to reach. Do not use any sheets or blankets that are too big for your bed. They should not hang down onto the floor. Have a firm chair that has side arms. You can  use this for support while you get dressed. Do not have throw rugs and other things on the floor that can make you trip. What can I do in the kitchen? Clean up any spills right away. Avoid walking on wet floors. Keep items that you use a lot in easy-to-reach places. If you need to reach something above you, use a strong step stool that has a grab bar. Keep electrical cords out of the way. Do not use floor polish or wax that makes floors slippery. If you must use wax, use non-skid floor wax. Do not have throw rugs and other things on the floor that can make you trip. What can I do with my stairs? Do not leave any items on the stairs. Make sure that there are handrails on both sides of the stairs and use them. Fix handrails that are broken or loose. Make sure that handrails are as long as the stairways. Check any carpeting to make sure that it is firmly attached to the stairs. Fix any carpet that is loose  or worn. Avoid having throw rugs at the top or bottom of the stairs. If you do have throw rugs, attach them to the floor with carpet tape. Make sure that you have a light switch at the top of the stairs and the bottom of the stairs. If you do not have them, ask someone to add them for you. What else can I do to help prevent falls? Wear shoes that: Do not have high heels. Have rubber bottoms. Are comfortable and fit you well. Are closed at the toe. Do not wear sandals. If you use a stepladder: Make sure that it is fully opened. Do not climb a closed stepladder. Make sure that both sides of the stepladder are locked into place. Ask someone to hold it for you, if possible. Clearly mark and make sure that you can see: Any grab bars or handrails. First and last steps. Where the edge of each step is. Use tools that help you move around (mobility aids) if they are needed. These include: Canes. Walkers. Scooters. Crutches. Turn on the lights when you go into a dark area. Replace any light bulbs as soon as they burn out. Set up your furniture so you have a clear path. Avoid moving your furniture around. If any of your floors are uneven, fix them. If there are any pets around you, be aware of where they are. Review your medicines with your doctor. Some medicines can make you feel dizzy. This can increase your chance of falling. Ask your doctor what other things that you can do to help prevent falls. This information is not intended to replace advice given to you by your health care provider. Make sure you discuss any questions you have with your health care provider. Document Released: 03/22/2009 Document Revised: 11/01/2015 Document Reviewed: 06/30/2014 Elsevier Interactive Patient Education  2017 [employer].

## 2022-09-25 NOTE — Progress Notes (Signed)
I connected with  [name] on 09/25/22 by a audio enabled telemedicine application and verified that I am speaking with the correct person using two identifiers.  Patient Location: Home  Provider Location: Office/Clinic  I discussed the limitations of evaluation and management by telemedicine. The patient expressed understanding and agreed to proceed.  Subjective:   KEYASHA MIAH is a 82 y.o. female who presents for Medicare Annual (Subsequent) preventive examination.  Review of Systems      Cardiac Risk Factors include: advanced age (>69men, >65 women);hypertension;diabetes mellitus;sedentary lifestyle     Objective:    Today's Vitals   09/25/22 1130  Weight: 162 lb (73.5 kg)  Height: 5\' 2"  (1.575 m)   Body mass index is 29.63 kg/m.     09/25/2022   11:50 AM 12/06/2021    7:37 AM 09/17/2021   11:39 AM 03/31/2019    9:07 AM 08/12/2018    9:44 AM 05/17/2018    4:28 PM 05/17/2018    6:14 AM  Advanced Directives  Does Patient Have a Medical Advance Directive? Yes No No No No  No  Type of Estate agent of [place];Living will        Copy of Healthcare Power of Attorney in Chart? No - copy requested        Would patient like information on creating a medical advance directive?  No - Patient declined No - Patient declined No - Patient declined Yes (MAU/Ambulatory/Procedural Areas - Information given) No - Patient declined     Current Medications (verified) Outpatient Encounter Medications as of 09/25/2022  Medication Sig   cetirizine (ZYRTEC) 10 MG tablet Take 10 mg by mouth daily as needed for allergies or rhinitis.    empagliflozin (JARDIANCE) 25 MG TABS tablet Take 1 tablet (25 mg total) by mouth daily before breakfast.   fenofibrate (TRICOR) 145 MG tablet Take 1 tablet by mouth once daily   glucose blood (ONETOUCH VERIO) test strip USE 1 STRIP TO CHECK GLUCOSE ONCE DAILY   HUMALOG KWIKPEN 100 UNIT/ML KwikPen INJECT 4 UNITS SUBCUTANEOUSLY TWICE  DAILY BEFORE MEAL(S)   hydrochlorothiazide (HYDRODIURIL) 25 MG tablet Take 1/2 (one-half) tablet by mouth once daily   imipramine (TOFRANIL) 50 MG tablet Take 1 tablet (50 mg total) by mouth at bedtime.   Insulin Pen Needle (B-D UF III MINI PEN NEEDLES) 31G X 5 MM MISC USE 1  THREE TIMES DAILY TO  INJECT  INSULIN   Insulin Pen Needle (B-D UF III MINI PEN NEEDLES) 31G X 5 MM MISC USE ONE THREE TIMES DAILY TO INJECT INSULIN   LANTUS SOLOSTAR 100 UNIT/ML Solostar Pen INJECT 26 UNITS SUBCUTANEOUSLY ONCE DAILY   levothyroxine (SYNTHROID) 88 MCG tablet TAKE 1 TABLET BY MOUTH ONCE DAILY BEFORE BREAKFAST   lisinopril (ZESTRIL) 20 MG tablet Take 1 tablet by mouth once daily   metFORMIN (GLUCOPHAGE-XR) 750 MG 24 hr tablet Take 1 tablet by mouth twice daily   methylphenidate (RITALIN) 20 MG tablet Take 0.5 tablets (10 mg total) by mouth 3 (three) times daily with meals.   OneTouch Delica Lancets 30G MISC USE 1  TO CHECK GLUCOSE ONCE DAILY   polyethylene glycol (MIRALAX / GLYCOLAX) packet Take 17 g by mouth daily as needed.   rosuvastatin (CRESTOR) 20 MG tablet Take 1 tablet (20 mg total) by mouth daily.   benzonatate (TESSALON) 200 MG capsule Take 1 capsule (200 mg total) by mouth 3 (three) times daily as needed for cough. Swallow whole (Patient not taking:  Reported on 09/25/2022)   doxycycline (VIBRA-TABS) 100 MG tablet Take 1 tablet (100 mg total) by mouth 2 (two) times daily. (Patient not taking: Reported on 09/25/2022)   predniSONE (DELTASONE) 10 MG tablet Take 3 pills once daily by mouth for 3 days, then 2 pills once daily for 3 days, then 1 pill once daily for 3 days and then stop (Patient not taking: Reported on 09/25/2022)   Probiotic Product (ALIGN) 4 MG CAPS Take 1 capsule by mouth daily. (Patient not taking: Reported on 09/25/2022)   traMADol (ULTRAM) 50 MG tablet Take 1 tablet (50 mg total) by mouth every 6 (six) hours as needed. (Patient not taking: Reported on 09/25/2022)   No facility-administered  encounter medications on file as of 09/25/2022.    Allergies (verified) Penicillins   History: Past Medical History:  Diagnosis Date   History of ischemic colitis 05/2018   Hyperlipidemia, mixed    Hypersomnia, persistent    Hypertension    Hypothyroidism    endocrinologist--- dr Lucianne Muss   Migraine    Narcolepsy with cataplexy    followed by neurologist--- dr dohmeier   Subcutaneous mass    left shoulder   Type 2 diabetes mellitus treated with insulin    endocrinologist--- dr a. Lucianne Muss   (12-02-2021  per pt check blood sugar daily 2 hours after evening meal)   Wears glasses    Wears hearing aid in both ears    Past Surgical History:  Procedure Laterality Date   APPENDECTOMY  1948   BIOPSY  05/24/2018   Procedure: BIOPSY;  Surgeon: Lemar Lofty., MD;  Location: [facility] ENDOSCOPY;  Service: Gastroenterology;;   COLONOSCOPY N/A 05/24/2018   Procedure: COLONOSCOPY;  Surgeon: Lemar Lofty., MD;  Location: [facility] ENDOSCOPY;  Service: Gastroenterology;  Laterality: N/A;   MASS EXCISION Left 12/06/2021   Procedure: LEFT SHOULDER SUBCUTANEOUS MASS EXCISION;  Surgeon: Quentin Ore, MD;  Location: [facility];  Service: General;  Laterality: Left;   POSTERIOR CERVICAL FUSION/FORAMINOTOMY  02/05/1975   C5-C6-C7   VAGINAL HYSTERECTOMY  1971   Family History  Problem Relation Age of Onset   Heart disease Mother        MI   Peripheral vascular disease Mother    Hyperlipidemia Mother    Depression Brother        suicide   Diabetes Brother    Diabetes Brother    Sleep apnea Brother    Sleep apnea Son    Diabetes Son    Heart attack Son    Sudden Cardiac Death Son    Social History   Socioeconomic History   Marital status: Widowed    Spouse name: Not on file   Number of children: 3   Years of education: GED   Highest education level: Not on file  Occupational History   Occupation: works 1 day a week  Tobacco Use   Smoking status: Former     Years: 10    Types: Cigarettes    Quit date: 1982    Years since quitting: 42.3   Smokeless tobacco: Never  Vaping Use   Vaping Use: Never used  Substance and Sexual Activity   Alcohol use: No    Alcohol/week: 0.0 standard drinks of alcohol   Drug use: Never   Sexual activity: Not on file  Other Topics Concern   Not on file  Social History Narrative   Patient is widowed.  Walks for exercise.  Patient has three children.Patient is right-handed.Patient drinks  three cups of caffeine daily.  Patient is retired.Patient has a GED.   Social Determinants of Health   Financial Resource Strain: Low Risk  (09/25/2022)   Overall Financial Resource Strain (CARDIA)    Difficulty of Paying Living Expenses: Not hard at all  Food Insecurity: No Food Insecurity (09/25/2022)   Hunger Vital Sign    Worried About Running Out of Food in the Last Year: Never true    Ran Out of Food in the Last Year: Never true  Transportation Needs: No Transportation Needs (09/25/2022)   PRAPARE - Administrator, Civil Service (Medical): No    Lack of Transportation (Non-Medical): No  Physical Activity: Inactive (09/25/2022)   Exercise Vital Sign    Days of Exercise per Week: 0 days    Minutes of Exercise per Session: 0 min  Stress: No Stress Concern Present (09/25/2022)   [employer] of Occupational Health - Occupational Stress Questionnaire    Feeling of Stress : Not at all  Social Connections: Moderately Isolated (09/25/2022)   Social Connection and Isolation Panel [NHANES]    Frequency of Communication with Friends and Family: More than three times a week    Frequency of Social Gatherings with Friends and Family: More than three times a week    Attends Religious Services: More than 4 times per year    Active Member of [employer] or Organizations: No    Attends Banker Meetings: Never    Marital Status: Widowed    Tobacco Counseling Counseling given: Not Answered   Clinical  Intake:  Pre-visit preparation completed: Yes  Pain : No/denies pain     Nutritional Risks: None Diabetes: Yes CBG done?: Yes (120 per pt) CBG resulted in Enter/ Edit results?: No Did pt. bring in CBG monitor from home?: No  How often do you need to have someone help you when you read instructions, pamphlets, or other written materials from your doctor or pharmacy?: 1 - Never  Diabetic?Nutrition Risk Assessment:  Has the patient had any N/V/D within the last 2 months?  No  Does the patient have any non-healing wounds?  No  Has the patient had any unintentional weight loss or weight gain?  No   Diabetes:  Is the patient diabetic?  Yes  If diabetic, was a CBG obtained today?  Yes , 120 per pt. Did the patient bring in their glucometer from home?  No  How often do you monitor your CBG's? QD.   Financial Strains and Diabetes Management:  Are you having any financial strains with the device, your supplies or your medication? No .  Does the patient want to be seen by Chronic Care Management for management of their diabetes?  No  Would the patient like to be referred to a Nutritionist or for Diabetic Management?  No   Diabetic Exams:  Diabetic Eye Exam: Overdue for diabetic eye exam. Pt has been advised about the importance in completing this exam. Patient advised to call and schedule an eye exam. Diabetic Foot Exam: Completed 02/12/2022 PCP    Interpreter Needed?: No  Information entered by :: C.Ebelyn Bohnet LPN   Activities of Daily Living    09/25/2022   11:52 AM 12/06/2021    7:45 AM  In your present state of health, do you have any difficulty performing the following activities:  Hearing? 1 1  Comment aids   Vision? 0 0  Difficulty concentrating or making decisions? 0 0  Walking or climbing stairs? 0 0  Dressing or bathing? 0 0  Doing errands, shopping? 0   Preparing Food and eating ? N   Using the Toilet? N   In the past six months, have you accidently leaked  urine? N   Comment occasionally when coughs   Do you have problems with loss of bowel control? N   Managing your Medications? N   Managing your Finances? N   Housekeeping or managing your Housekeeping? N     Patient Care Team: Tower, Audrie Gallus, MD as PCP - General Dohmeier, Porfirio Mylar, MD as Consulting Physician (Neurology) Hanley Ben, MD as Consulting Physician (Audiology)  Indicate any recent Medical Services you may have received from other than [facility] providers in the past year (date may be approximate).     Assessment:   This is a routine wellness examination for Aariona.  Hearing/Vision screen Hearing Screening - Comments:: aids Vision Screening - Comments:: Glasses - Unknown provider  Dietary issues and exercise activities discussed: Current Exercise Habits: The patient does not participate in regular exercise at present, Exercise limited by: None identified   Goals Addressed   None    Depression Screen    09/25/2022   11:50 AM 01/03/2022    3:27 PM 09/17/2021   11:44 AM 05/24/2020   10:30 AM 03/31/2019    9:10 AM 08/12/2018    9:46 AM 03/09/2018    1:32 PM  PHQ 2/9 Scores  PHQ - 2 Score 0 0 0 0 0 0 0  PHQ- 9 Score     0  0    Fall Risk    09/25/2022   11:51 AM 01/03/2022    3:27 PM 09/17/2021   11:59 AM 05/24/2020   10:30 AM 03/31/2019    9:10 AM  Fall Risk   Falls in the past year? 0 0 0 0 0  Number falls in past yr: 0  0  0  Injury with Fall? 0  0  0  Risk for fall due to : No Fall Risks    Medication side effect  Follow up Falls prevention discussed;Falls evaluation completed Falls evaluation completed Falls evaluation completed;Education provided;Falls prevention discussed Falls evaluation completed Falls evaluation completed;Falls prevention discussed    FALL RISK PREVENTION PERTAINING TO THE HOME:  Any stairs in or around the home? Yes  If so, are there any without handrails? No  Home free of loose throw rugs in walkways, pet beds, electrical cords,  etc? Yes  Adequate lighting in your home to reduce risk of falls? Yes   ASSISTIVE DEVICES UTILIZED TO PREVENT FALLS:  Life alert? No  Use of a cane, walker or w/c? No  Grab bars in the bathroom? Yes  Shower chair or bench in shower? Yes  Elevated toilet seat or a handicapped toilet? No    Cognitive Function:    03/31/2019    9:15 AM 03/09/2018    1:33 PM 02/19/2016    9:38 AM  MMSE - Mini Mental State Exam  Orientation to time 5 5 5   Orientation to Place 5 5 5   Registration 3 3 3   Attention/ Calculation 5 0 0  Recall 3 3 3   Language- name 2 objects  0 0  Language- repeat 1 1 1   Language- follow 3 step command  3 3  Language- read & follow direction  0 0  Write a sentence  0 0  Copy design  0 0  Total score  20 20      06/26/2015  9:23 AM  Montreal Cognitive Assessment   Visuospatial/ Executive (0/5) 5  Naming (0/3) 2  Attention: Read list of digits (0/2) 2  Attention: Read list of letters (0/1) 1  Attention: Serial 7 subtraction starting at 100 (0/3) 2  Language: Repeat phrase (0/2) 1  Language : Fluency (0/1) 1  Abstraction (0/2) 2  Delayed Recall (0/5) 4  Orientation (0/6) 6  Total 26      09/25/2022   11:54 AM 09/17/2021   11:36 AM  6CIT Screen  What Year? 0 points 0 points  What month? 0 points 0 points  What time? 0 points 0 points  Count back from 20 0 points 0 points  Months in reverse 0 points 4 points  Repeat phrase 8 points 2 points  Total Score 8 points 6 points    Immunizations Immunization History  Administered Date(s) Administered   Fluad Quad(high Dose 65+) 02/22/2019, 04/16/2021   Influenza Split 03/10/2011   Influenza Whole 06/09/2002, 04/09/2007, 03/02/2008, 03/23/2009, 04/09/2010   Influenza, High Dose Seasonal PF 05/13/2017, 03/09/2018, 04/06/2020, 04/03/2022   Influenza,inj,Quad PF,6+ Mos 04/30/2015, 02/15/2016   Influenza-Unspecified 03/07/2014   PFIZER(Purple Top)SARS-COV-2 Vaccination 09/03/2019, 09/24/2019   Pneumococcal  Conjugate-13 08/03/2015   Pneumococcal Polysaccharide-23 06/09/2002, 03/02/2008   Respiratory Syncytial Virus Vaccine,Recomb Aduvanted(Arexvy) 04/03/2022   Td 04/25/1997   Tdap 07/01/2011    TDAP status: Up to date  Flu Vaccine status: Up to date  Pneumococcal vaccine status: Up to date  Covid-19 vaccine status: Declined, Education has been provided regarding the importance of this vaccine but patient still declined. Advised may receive this vaccine at local pharmacy or Health Dept.or vaccine clinic. Aware to provide a copy of the vaccination record if obtained from local pharmacy or Health Dept. Verbalized acceptance and understanding.  Qualifies for Shingles Vaccine? Yes   Zostavax completed No   Shingrix Completed?: No.    Education has been provided regarding the importance of this vaccine. Patient has been advised to call insurance company to determine out of pocket expense if they have not yet received this vaccine. Advised may also receive vaccine at local pharmacy or Health Dept. Verbalized acceptance and understanding.  Screening Tests Health Maintenance  Topic Date Due   Zoster Vaccines- Shingrix (1 of 2) Never done   OPHTHALMOLOGY EXAM  12/08/2019   DTaP/Tdap/Td (3 - Td or Tdap) 06/30/2021   COVID-19 Vaccine (3 - 2023-24 season) 02/07/2022   Diabetic kidney evaluation - Urine ACR  11/13/2022   INFLUENZA VACCINE  01/08/2023   FOOT EXAM  02/13/2023   HEMOGLOBIN A1C  02/28/2023   Diabetic kidney evaluation - eGFR measurement  08/28/2023   Medicare Annual Wellness (AWV)  09/25/2023   Pneumonia Vaccine 28+ Years old  Completed   DEXA SCAN  Completed   HPV VACCINES  Aged Out    Health Maintenance  Health Maintenance Due  Topic Date Due   Zoster Vaccines- Shingrix (1 of 2) Never done   OPHTHALMOLOGY EXAM  12/08/2019   DTaP/Tdap/Td (3 - Td or Tdap) 06/30/2021   COVID-19 Vaccine (3 - 2023-24 season) 02/07/2022    Colorectal cancer screening: No longer required.    Mammogram status: Ordered by Dr.Tower. Pt provided with contact info and advised to call to schedule appt.   Bone Scan - Declined  Lung Cancer Screening: (Low Dose CT Chest recommended if Age 31-80 years, 30 pack-year currently smoking OR have quit w/in 15years.) does not qualify.   Lung Cancer Screening Referral: no  Additional Screening:  Hepatitis  C Screening: does not qualify; Completed no  Vision Screening: Recommended annual ophthalmology exams for early detection of glaucoma and other disorders of the eye. Is the patient up to date with their annual eye exam?  No  Who is the provider or what is the name of the office in which the patient attends annual eye exams? Unknown provider, pt will call for appointment. If pt is not established with a provider, would they like to be referred to a provider to establish care? No .   Dental Screening: Recommended annual dental exams for proper oral hygiene  Community Resource Referral / Chronic Care Management: CRR required this visit?  No   CCM required this visit?  No      Plan:     I have personally reviewed and noted the following in the patient's chart:   Medical and social history Use of alcohol, tobacco or illicit drugs  Current medications and supplements including opioid prescriptions. Patient is not currently taking opioid prescriptions. Functional ability and status Nutritional status Physical activity Advanced directives List of other physicians Hospitalizations, surgeries, and ER visits in previous 12 months Vitals Screenings to include cognitive, depression, and falls Referrals and appointments  In addition, I have reviewed and discussed with patient certain preventive protocols, quality metrics, and best practice recommendations. A written personalized care plan for preventive services as well as general preventive health recommendations were provided to patient.     Maryan Puls, LPN   09/15/8117    Nurse Notes: none

## 2022-09-25 NOTE — Progress Notes (Signed)
[facility]   HOME SLEEP TEST REPORT ( by Watch PAT)   STUDY DATE: 09/25/2022  Amanda King 82 year old female Jan 22, 1941   ORDERING CLINICIAN: Melvyn Novas, MD  REFERRING CLINICIAN: Roxy Manns, MD   CLINICAL INFORMATION/HISTORY:  Amanda King is a 82 y.o. female patient who is here for revisit 08/05/2022 for  Narcolepsy/ Cataplexy treatment and follow up. The patient states that  narcolepsy was diagnosed  after a cervical spine surgery in 1987/10/26 , she had presented with hypersomnia, sleep hallucinations and with cataplectic attacks. She had been followed by Dr. Buzzy Han , Dr. Meryl Crutch and was Dr. Imagene Gurney patient until his retirement.   " I have a lot of congestion and was given prednisone and now my sugar and BP is messed up".  She was treated for maxillary sinuitis according to Dr Lucretia Roers notes. Her speech is slightly dysarthric , which she attributes to ill fitting dentures and she is presenting with high blood pressure.  Denies headaches and  Vertigo. She reports a cataplectic spell on the anniversary of her son's death, on 2022-07-24. He died in Oct 25, 2020. She has still all his belongings in their old place, and has not emptied his wardrobe etc. She is grieving. Her husband died 16 years ago. She reports her medication works well, and she is not interested in changing anything. She is familiar with the medication and she sleeps well.  She may take a power nap. She goes to bed sometime between 10-11 PM and sleeps 6-7 hours . Her dog wakes her up.      Epworth sleepiness score: 8 /24. FSS 15/ 63 points  on Ritalin and Tofranil.   BMI: 29.6 kg/m   Neck Circumference:  15.5"   FINDINGS:   Sleep Summary:   Total Recording Time (hours, min):   8 hours 8 minutes  Total Sleep Time (hours, min):   7 hours 42 minutes              Percent REM (%): 28%  Sleep latency was 10 minutes, REM sleep latency 130 minutes, WASO=wake after sleep onset time 15 minutes.     Snoring  reached a mean volume of 41 dB and was present for 30% of sleep time.                                     Respiratory Indices (per AASM ) :   Calculated pAHI (per hour):    18.6/h                         REM pAHI:   30.3/h                                              NREM pAHI: 14.1/h                            Positional AHI: The AHI was dominantly seen in supine sleep with an AHI of 28/h followed by right lateral sleep with an AHI of 6.8/h and prone sleep with an AHI of 2.7/h.  Please note that all REM sleep was recorded while the patient was in supine position.  Oxygen Saturation Statistics:    O2 Saturation Range (%):     Nadir of 79 with a maximum saturation of 97 with a mean saturation of 93%                                  O2 Saturation (minutes) <89%: 11 minutes         Pulse Rate Statistics:   Pulse Mean (bpm): 87 bpm               Pulse Range:   72 and 101 bpm              IMPRESSION:  This HST confirms the presence of moderate obstructive sleep apnea without any central apnea being noted.  This is a strongly REM sleep dependent apnea and also exacerbated and dependent on supine sleep.  The patient should use positive airway pressure therapy as dental device related mandibular advancement   or hypoglossal nerve stimulation does not correct REM- sleep-dependent apnea.   She also should avoid sleeping on her back if possible.  Weight loss may additionally help to reduce her apnea but her current BMI is already under 30.     RECOMMENDATION: This patient should use positive airway pressure as an auto titration device and will permit between 5 and 16 cmH2O pressure, 2 cm EPR.  Please note that this patient has full dentures and that it would be very hard to get a facial air seal if these dentures are taken out overnight.  This finding of apnea ( OSA) does not change her therapy for narcolepsy-cataplexy by medication.     INTERPRETING PHYSICIAN:   Melvyn Novas, MD   Medical Director of [facility] at [facility].

## 2022-09-26 NOTE — Procedures (Signed)
[facility]   HOME SLEEP TEST REPORT ( by Watch PAT)   STUDY DATE: 09/25/2022  Amanda King 82 year old female 1940/10/06   ORDERING CLINICIAN: Melvyn Novas, MD  REFERRING CLINICIAN: Roxy Manns, MD   CLINICAL INFORMATION/HISTORY:  Amanda King is a 82 y.o. female patient who is here for revisit 08/05/2022 for  Narcolepsy/ Cataplexy treatment and follow up. The patient states that  narcolepsy was diagnosed  after a cervical spine surgery in 10-29-87 , she had presented with hypersomnia, sleep hallucinations and with cataplectic attacks. She had been followed by Dr. Buzzy Han , Dr. Meryl Crutch and was Dr. Imagene Gurney patient until his retirement.   " I have a lot of congestion and was given prednisone and now my sugar and BP is messed up".  She was treated for maxillary sinuitis according to Dr Lucretia Roers notes. Her speech is slightly dysarthric , which she attributes to ill fitting dentures and she is presenting with high blood pressure.  Denies headaches and  Vertigo. She reports a cataplectic spell on the anniversary of her son's death, on 07/28/2022. He died in Oct 28, 2020. She has still all his belongings in their old place, and has not emptied his wardrobe etc. She is grieving. Her husband died 16 years ago. She reports her medication works well, and she is not interested in changing anything. She is familiar with the medication and she sleeps well.  She may take a power nap. She goes to bed sometime between 10-11 PM and sleeps 6-7 hours . Her dog wakes her up.      Epworth sleepiness score: 8 /24. FSS 15/ 63 points  on Ritalin and Tofranil.   BMI: 29.6 kg/m   Neck Circumference:  15.5"   FINDINGS:   Sleep Summary:   Total Recording Time (hours, min):   8 hours 8 minutes  Total Sleep Time (hours, min):   7 hours 42 minutes              Percent REM (%): 28%  Sleep latency was 10 minutes, REM sleep latency 130 minutes, WASO=wake after sleep onset time 15 minutes.     Snoring reached a  mean volume of 41 dB and was present for 30% of sleep time.                                     Respiratory Indices (per AASM ) :   Calculated pAHI (per hour):    18.6/h                         REM pAHI:   30.3/h                                              NREM pAHI: 14.1/h                            Positional AHI: The AHI was dominantly seen in supine sleep with an AHI of 28/h followed by right lateral sleep with an AHI of 6.8/h and prone sleep with an AHI of 2.7/h.  Please note that all REM sleep was recorded while the patient was in supine position.  Oxygen Saturation Statistics:    O2 Saturation Range (%):     Nadir of 79 with a maximum saturation of 97 with a mean saturation of 93%                                  O2 Saturation (minutes) <89%: 11 minutes         Pulse Rate Statistics:   Pulse Mean (bpm): 87 bpm               Pulse Range:   72 and 101 bpm              IMPRESSION:  This HST confirms the presence of moderate obstructive sleep apnea without any central apnea being noted.  This is a strongly REM sleep dependent apnea and also exacerbated and dependent on supine sleep.  The patient should use positive airway pressure therapy as dental device related mandibular advancement   or hypoglossal nerve stimulation does not correct REM- sleep-dependent apnea.   She also should avoid sleeping on her back if possible.  Weight loss may additionally help to reduce her apnea but her current BMI is already under 30 but she  carries mainly abdominal weight. Core strength improvement by exercise can make a greater impact.      RECOMMENDATION: This patient should use positive airway pressure as an auto titration device and will permit between 5 and 16 cmH2O pressure, 2 cm EPR.  Please note that this patient has full dentures and that it would be very hard to get a facial air seal if these dentures are taken out overnight.  This finding  of apnea ( OSA) does not change her therapy for narcolepsy-cataplexy by medication.    INTERPRETING PHYSICIAN:   Melvyn Novas, MD   Medical Director of [facility] at [facility].

## 2022-09-29 ENCOUNTER — Telehealth: Payer: Self-pay

## 2022-09-29 NOTE — Telephone Encounter (Signed)
Contacted pt regarding sleep study, LVM rq call back  

## 2022-09-29 NOTE — Telephone Encounter (Signed)
-----   Message from Melvyn Novas, MD sent at 09/26/2022  2:11 PM EDT ----- I foresee some difficulties with CPAP mask in an edentulous patient. Still worth trying.

## 2022-09-30 NOTE — Telephone Encounter (Signed)
Contacted pt again, she stated she is at the beach and will call the office back when she returns after Thursday.

## 2022-10-10 ENCOUNTER — Other Ambulatory Visit: Payer: Self-pay | Admitting: Neurology

## 2022-10-10 DIAGNOSIS — G4710 Hypersomnia, unspecified: Secondary | ICD-10-CM

## 2022-10-10 NOTE — Telephone Encounter (Signed)
Pt called stating that her pharmacy does not have her methylphenidate (RITALIN) 20 MG tablet but the [facility] on [address] does not have the 135 she is needing but they do have 110. Pt would like a Rx sent to that [facility].

## 2022-10-13 ENCOUNTER — Other Ambulatory Visit: Payer: Self-pay | Admitting: Neurology

## 2022-10-13 DIAGNOSIS — G4710 Hypersomnia, unspecified: Secondary | ICD-10-CM

## 2022-10-13 MED ORDER — METHYLPHENIDATE HCL 20 MG PO TABS: 10.0000 mg | ORAL_TABLET | Freq: Three times a day (TID) | ORAL | 0 refills | Status: DC

## 2022-10-13 NOTE — Telephone Encounter (Signed)
Pt asked it be messaged again, so there is no confusion.  She is asking the methylphenidate (RITALIN) 20 MG tablet be called into [facility] [phone]

## 2022-10-15 ENCOUNTER — Encounter: Payer: Self-pay | Admitting: Neurology

## 2022-10-15 ENCOUNTER — Telehealth: Payer: Self-pay | Admitting: Neurology

## 2022-10-15 NOTE — Telephone Encounter (Signed)
Megan from [place] called with the approval for the pts medication starting  10/15/22-10/15/23

## 2022-10-15 NOTE — Telephone Encounter (Signed)
PA completed for the patient on CMM/[employer] medicare ZOX:[id number] Will await determination

## 2022-10-20 ENCOUNTER — Ambulatory Visit (INDEPENDENT_AMBULATORY_CARE_PROVIDER_SITE_OTHER): Payer: Medicare Other | Admitting: Family Medicine

## 2022-10-20 ENCOUNTER — Encounter: Payer: Self-pay | Admitting: Family Medicine

## 2022-10-20 VITALS — BP 135/80 | HR 71 | Temp 97.8°F | Ht 61.0 in | Wt 164.0 lb

## 2022-10-20 DIAGNOSIS — W57XXXA Bitten or stung by nonvenomous insect and other nonvenomous arthropods, initial encounter: Secondary | ICD-10-CM | POA: Diagnosis not present

## 2022-10-20 DIAGNOSIS — S40862A Insect bite (nonvenomous) of left upper arm, initial encounter: Secondary | ICD-10-CM | POA: Insufficient documentation

## 2022-10-20 MED ORDER — TRIAMCINOLONE ACETONIDE 0.1 % EX CREA: 1.0000 | TOPICAL_CREAM | Freq: Two times a day (BID) | CUTANEOUS | 0 refills | Status: DC

## 2022-10-20 NOTE — Assessment & Plan Note (Signed)
L upper arm - occurred after working outdoors Saturday (woke up with Sunday) Was not aware of being bitten or stung  No ticks identified and no poison ivy  Area of induration is larger/ redness is smaller than yesterday   Cannot r/o spider bite, fly or sting  Discussed use of cool compress  Px triamcinolone cream to use bid prn  Update if not starting to improve in a week or if worsening    Discussed s/s of bacterial infection such as enl redness/ streaks or fever - pt voiced understanding to watch for

## 2022-10-20 NOTE — Patient Instructions (Signed)
Keep the insect bite area clean with soap and water  Cool compress may help itch   Use the triamcinolone cream twice daily ((tiny amount) until this improves  Update if not starting to improve in a week or if worsening  If you get red streadks or feel sick or get a fever let us know

## 2022-10-20 NOTE — Progress Notes (Signed)
Subjective:    Patient ID: Amanda King, female    DOB: [birth year], 82 y.o.   MRN: [id number]  HPI Pt presents with insect bite on L arm   Wt Readings from Last 3 Encounters:  10/20/22 164 lb (74.4 kg)  09/25/22 162 lb (73.5 kg)  09/01/22 163 lb 9.6 oz (74.2 kg)   30.99 kg/m  Vitals:   10/20/22 1215 10/20/22 1233  BP: (!) 142/80 135/80  Pulse: 71   Temp: 97.8 F (36.6 C)   SpO2: 98%     Has a red swollen area on L upper arm  Worked outside on Saturday  Noticed area on Sunday when she woke up  Warm and itchy  A little tender/sensitive   Wonders if it was a spider bite   Used  Rubbing alcohol Neo sporin  Used a topical anti itch product   No ticks Checks herself and her dog   Has not used ice or heat   Patient Active Problem List   Diagnosis Date Noted   Insect bite of left arm 10/20/2022   At risk for obstructive sleep apnea 08/05/2022   Snoring 08/05/2022   Productive cough 06/11/2022   Allergic rhinitis 04/21/2022   Encounter for screening mammogram for breast cancer 01/03/2022   Grief reaction 02/19/2021   Family history of sudden cardiac death February 19, 2021   Localized swelling, mass and lump, neck 19-Feb-2021   History of ischemic colitis    Constipation    Routine general medical examination at a health care facility 01/31/2016   Narcolepsy with cataplexy 10/31/2014   Acute sinusitis 08/15/2014   Encounter for Medicare annual wellness exam 09/22/2013   Hypersomnia, persistent 07/15/2013   Colon cancer screening 12/31/2011   Obesity (BMI 30-39.9) 07/01/2011   ANXIETY 05/06/2010   DERMATOPHYTOSIS OF NAIL 12/21/2008   Hypothyroidism 09/02/2006   Diabetes type 2, uncontrolled 09/02/2006   Hyperlipidemia associated with type 2 diabetes mellitus (HCC) 09/02/2006   NARCOLEPSY W/CATAPLEXY 09/02/2006   Essential hypertension 09/02/2006   PNEUMONIA, HX OF 09/02/2006   Past Medical History:  Diagnosis Date   History of ischemic colitis 05/2018    Hyperlipidemia, mixed    Hypersomnia, persistent    Hypertension    Hypothyroidism    endocrinologist--- dr Lucianne Muss   Migraine    Narcolepsy with cataplexy    followed by neurologist--- dr dohmeier   Subcutaneous mass    left shoulder   Type 2 diabetes mellitus treated with insulin [facility])    endocrinologist--- dr a. Lucianne Muss   (12-02-2021  per pt check blood sugar daily 2 hours after evening meal)   Wears glasses    Wears hearing aid in both ears    Past Surgical History:  Procedure Laterality Date   APPENDECTOMY  1948   BIOPSY  05/24/2018   Procedure: BIOPSY;  Surgeon: Lemar Lofty., MD;  Location: [facility] ENDOSCOPY;  Service: Gastroenterology;;   COLONOSCOPY N/A 05/24/2018   Procedure: COLONOSCOPY;  Surgeon: Lemar Lofty., MD;  Location: [facility] ENDOSCOPY;  Service: Gastroenterology;  Laterality: N/A;   MASS EXCISION Left 12/06/2021   Procedure: LEFT SHOULDER SUBCUTANEOUS MASS EXCISION;  Surgeon: Quentin Ore, MD;  Location: [facility];  Service: General;  Laterality: Left;   POSTERIOR CERVICAL FUSION/FORAMINOTOMY  February 20, 1975   C5-C6-C7   VAGINAL HYSTERECTOMY  1971   Social History   Tobacco Use   Smoking status: Former    Years: 10    Types: Cigarettes    Quit date: 1982  Years since quitting: 42.3   Smokeless tobacco: Never  Vaping Use   Vaping Use: Never used  Substance Use Topics   Alcohol use: No    Alcohol/week: 0.0 standard drinks of alcohol   Drug use: Never   Family History  Problem Relation Age of Onset   Heart disease Mother        MI   Peripheral vascular disease Mother    Hyperlipidemia Mother    Depression Brother        suicide   Diabetes Brother    Diabetes Brother    Sleep apnea Brother    Sleep apnea Son    Diabetes Son    Heart attack Son    Sudden Cardiac Death Son    Allergies  Allergen Reactions   Penicillins Nausea And Vomiting and Rash    Has patient had a PCN reaction causing immediate rash,  facial/tongue/throat swelling, SOB or lightheadedness with hypotension: Unknown Has patient had a PCN reaction causing severe rash involving mucus membranes or skin necrosis: Unknown Has patient had a PCN reaction that required hospitalization: Unknown Has patient had a PCN reaction occurring within the last 10 years: No If all of the above answers are "NO", then may proceed with Cephalosporin use.    Current Outpatient Medications on File Prior to Visit  Medication Sig Dispense Refill   cetirizine (ZYRTEC) 10 MG tablet Take 10 mg by mouth daily as needed for allergies or rhinitis.      empagliflozin (JARDIANCE) 25 MG TABS tablet Take 1 tablet (25 mg total) by mouth daily before breakfast. 30 tablet 3   fenofibrate (TRICOR) 145 MG tablet Take 1 tablet by mouth once daily 90 tablet 0   glucose blood (ONETOUCH VERIO) test strip USE 1 STRIP TO CHECK GLUCOSE ONCE DAILY 100 each 2   HUMALOG KWIKPEN 100 UNIT/ML KwikPen INJECT 4 UNITS SUBCUTANEOUSLY TWICE DAILY BEFORE MEAL(S) 15 mL 0   hydrochlorothiazide (HYDRODIURIL) 25 MG tablet Take 1/2 (one-half) tablet by mouth once daily 45 tablet 2   imipramine (TOFRANIL) 50 MG tablet Take 1 tablet (50 mg total) by mouth at bedtime. 90 tablet 3   Insulin Pen Needle (B-D UF III MINI PEN NEEDLES) 31G X 5 MM MISC USE 1  THREE TIMES DAILY TO  INJECT  INSULIN 100 each 0   Insulin Pen Needle (B-D UF III MINI PEN NEEDLES) 31G X 5 MM MISC USE ONE THREE TIMES DAILY TO INJECT INSULIN 100 each 1   LANTUS SOLOSTAR 100 UNIT/ML Solostar Pen INJECT 26 UNITS SUBCUTANEOUSLY ONCE DAILY 15 mL 3   levothyroxine (SYNTHROID) 88 MCG tablet TAKE 1 TABLET BY MOUTH ONCE DAILY BEFORE BREAKFAST 90 tablet 1   lisinopril (ZESTRIL) 20 MG tablet Take 1 tablet by mouth once daily 90 tablet 2   metFORMIN (GLUCOPHAGE-XR) 750 MG 24 hr tablet Take 1 tablet by mouth twice daily 180 tablet 0   methylphenidate (RITALIN) 20 MG tablet Take 0.5 tablets (10 mg total) by mouth 3 (three) times daily with  meals. 135 tablet 0   OneTouch Delica Lancets 30G MISC USE 1  TO CHECK GLUCOSE ONCE DAILY 100 each 0   polyethylene glycol (MIRALAX / GLYCOLAX) packet Take 17 g by mouth daily as needed. 14 each 0   Probiotic Product (ALIGN) 4 MG CAPS Take 1 capsule by mouth daily.     rosuvastatin (CRESTOR) 20 MG tablet Take 1 tablet (20 mg total) by mouth daily. 90 tablet 3   No current facility-administered medications  on file prior to visit.     Review of Systems  Constitutional:  Positive for fatigue.       Some fatigue Did a home sleep study   HENT:  Negative for postnasal drip, rhinorrhea and voice change.        No swelling of mouth or throat  Respiratory:  Negative for cough, shortness of breath, wheezing and stridor.        Snoring  Skin:        Itchy red area on L upper arm        Objective:   Physical Exam Constitutional:      Appearance: Normal appearance. She is obese.  HENT:     Head: Normocephalic and atraumatic.     Mouth/Throat:     Mouth: Mucous membranes are moist.  Eyes:     General:        Right eye: No discharge.        Left eye: No discharge.     Extraocular Movements: Extraocular movements intact.     Conjunctiva/sclera: Conjunctivae normal.     Pupils: Pupils are equal, round, and reactive to light.  Cardiovascular:     Rate and Rhythm: Normal rate and regular rhythm.  Pulmonary:     Effort: Pulmonary effort is normal. No respiratory distress.     Breath sounds: Normal breath sounds. No stridor. No wheezing, rhonchi or rales.  Musculoskeletal:     Cervical back: Neck supple. No tenderness.  Lymphadenopathy:     Cervical: No cervical adenopathy.  Skin:    Comments: L upper arm Quarter size area of erythema without skin breakdown or papule or vesicle  Induration about 3 by 4 cm area surrounding No central clearing    No other area of involved skin  Neurological:     Mental Status: She is alert.  Psychiatric:        Mood and Affect: Mood normal.            Assessment & Plan:   Problem List Items Addressed This Visit       Musculoskeletal and Integument   Insect bite of left arm - Primary    L upper arm - occurred after working outdoors Saturday (woke up with Sunday) Was not aware of being bitten or stung  No ticks identified and no poison ivy  Area of induration is larger/ redness is smaller than yesterday   Cannot r/o spider bite, fly or sting  Discussed use of cool compress  Px triamcinolone cream to use bid prn  Update if not starting to improve in a week or if worsening    Discussed s/s of bacterial infection such as enl redness/ streaks or fever - pt voiced understanding to watch for

## 2022-10-22 ENCOUNTER — Telehealth: Payer: Self-pay | Admitting: Neurology

## 2022-10-22 NOTE — Telephone Encounter (Signed)
Called the patient back    This HST confirms the presence of moderate obstructive sleep apnea without any central apnea being noted.  This is a strongly REM sleep dependent apnea and also exacerbated and dependent on supine sleep.   The patient should use positive airway pressure therapy as dental device related mandibular advancement   or hypoglossal nerve stimulation does not correct REM- sleep-dependent apnea.   She also should avoid sleeping on her back if possible.  Weight loss may additionally help to reduce her apnea but her current BMI is already under 30.   This patient should use positive airway pressure as an auto titration device and will permit between 5 and 16 cmH2O pressure, 2 cm EPR.  Please note that this patient has full dentures and that it would be very hard to get a facial air seal if these dentures are taken out overnight.   This finding of apnea ( OSA) does not change her therapy for narcolepsy-cataplexy by medication.

## 2022-10-22 NOTE — Telephone Encounter (Signed)
Pt stated she is following up on Sleep Study results.

## 2022-10-22 NOTE — Telephone Encounter (Signed)
I called pt. I advised pt that Dr. Vickey Huger reviewed their sleep study results and found that pt has moderate OSA. Dr. Vickey Huger recommends that pt starts auto CPAP. I reviewed PAP compliance expectations with the pt. Pt is agreeable to starting a CPAP. I advised pt that an order will be sent to a DME, [facility], and [facility] will call the pt within about one week after they file with the pt's insurance. [facility] will show the pt how to use the machine, fit for masks, and troubleshoot the CPAP if needed. A follow up appt was made for insurance purposes with Dr. Vickey Huger on 01/05/2023 at 1:30 pm. Pt verbalized understanding to arrive 15 minutes early and bring their CPAP. Pt verbalized understanding of results. Pt had no questions at this time but was encouraged to call back if questions arise. I have sent the order to [facility] and have received confirmation that they have received the order.

## 2022-10-23 ENCOUNTER — Other Ambulatory Visit: Payer: Self-pay | Admitting: Endocrinology

## 2022-10-23 DIAGNOSIS — E11649 Type 2 diabetes mellitus with hypoglycemia without coma: Secondary | ICD-10-CM

## 2022-11-05 ENCOUNTER — Other Ambulatory Visit: Payer: Self-pay | Admitting: Endocrinology

## 2022-11-06 DIAGNOSIS — G4733 Obstructive sleep apnea (adult) (pediatric): Secondary | ICD-10-CM | POA: Diagnosis not present

## 2022-11-07 ENCOUNTER — Other Ambulatory Visit: Payer: Self-pay | Admitting: Family Medicine

## 2022-11-18 ENCOUNTER — Other Ambulatory Visit: Payer: Self-pay

## 2022-11-18 MED ORDER — HUMALOG KWIKPEN 100 UNIT/ML ~~LOC~~ SOPN: PEN_INJECTOR | SUBCUTANEOUS | 0 refills | Status: DC

## 2022-12-01 ENCOUNTER — Ambulatory Visit: Payer: Medicare Other | Admitting: Endocrinology

## 2022-12-01 VITALS — HR 98 | Ht 62.0 in | Wt 163.4 lb

## 2022-12-01 DIAGNOSIS — E1165 Type 2 diabetes mellitus with hyperglycemia: Secondary | ICD-10-CM

## 2022-12-01 DIAGNOSIS — I10 Essential (primary) hypertension: Secondary | ICD-10-CM | POA: Diagnosis not present

## 2022-12-01 DIAGNOSIS — Z794 Long term (current) use of insulin: Secondary | ICD-10-CM

## 2022-12-01 LAB — POCT GLYCOSYLATED HEMOGLOBIN (HGB A1C): Hemoglobin A1C: 7.7 % — AB (ref 4.0–5.6)

## 2022-12-01 NOTE — Progress Notes (Signed)
Patient ID: Amanda King, female   DOB: 03-Jul-1940, 82 y.o.   MRN: [id number]           Reason for Appointment: Follow-up for Type 2 Diabetes  Referring PCP: Dr. Milinda Antis   History of Present Illness:          Date of diagnosis of type 2 diabetes mellitus:  2008      Background history:  She was initially treated with metformin and subsequently glipizide added Januvia was added in 12/2016 She has had variable control with A1c as high as 10.3 done in 2015 and as low as 7.3 done in April 2016 Has generally been followed by her PCP about twice a year  She was tried on [place] in 2020  Recent history:     Non-insulin hypoglycemic drugs: Metformin ER 750 mg twice daily,  Jardiance 25 mg 1/2 daily  INSULIN doses: Lantus 26  units daily acs, Humalog 6 units at breakfast and supper  Her A1c is 7.7 compared to 8.2, improved   Current management, blood sugar patterns and problems identified: She did bring her monitor for download  Although she had a higher A1c in March because of intercurrent illness her blood sugars are only slightly better She has not been able to follow her diet with eating pizza, hamburgers and other high-fat foods like [place], [place] fries especially when she is taking care of grandchildren or going out to eat Her blood sugars are fluctuating and sometimes significantly higher overnight Also not clear if some of her readings at midday may be higher from eating cereal in the morning Jardiance was previously stopped because of the cost but she is still able to tolerate a half a tablet of the 25 mg without yeast infections However she is now having a yeast infection She is somewhat active with taking care of grandchildren and also occasionally using recumbent bike at times Weight is about the same No hypoglycemia Dinner 5-6 p.m. usually       Side effects from medications have been: Diarrhea from high dose metformin            Glucose monitoring:  done about 1  times a day         Glucometer:  One Touch Verio Flex      Blood Glucose readings/meter not downloaded, manually reviewed   PRE-MEAL Fasting Midday  Dinner Bedtime Overall  Glucose range: 128-202 207     Mean/median:     ?   POST-MEAL PC Breakfast PC Lunch PC Dinner  Glucose range:   143-193  Mean/median:        Dietician visit, most recent: 08/12/2018  Weight history:  Wt Readings from Last 3 Encounters:  12/01/22 163 lb 6.4 oz (74.1 kg)  10/20/22 164 lb (74.4 kg)  09/25/22 162 lb (73.5 kg)    Glycemic control:   Lab Results  Component Value Date   HGBA1C 7.7 (A) 12/01/2022   HGBA1C 8.2 (H) 08/28/2022   HGBA1C 7.2 (A) 02/12/2022   Lab Results  Component Value Date   MICROALBUR <0.7 11/12/2021   LDLCALC 69 05/08/2022   CREATININE 1.07 08/28/2022   Lab Results  Component Value Date   MICRALBCREAT 1.6 11/12/2021    Lab Results  Component Value Date   FRUCTOSAMINE 309 (H) 06/18/2021   FRUCTOSAMINE 267 11/22/2018   FRUCTOSAMINE 349 (H) 08/18/2018    Office Visit on 12/01/2022  Component Date Value Ref Range Status   Hemoglobin A1C 12/01/2022 7.7 (A)  4.0 - 5.6 % Final    Allergies as of 12/01/2022       Reactions   Penicillins Nausea And Vomiting, Rash   Has patient had a PCN reaction causing immediate rash, facial/tongue/throat swelling, SOB or lightheadedness with hypotension: Unknown Has patient had a PCN reaction causing severe rash involving mucus membranes or skin necrosis: Unknown Has patient had a PCN reaction that required hospitalization: Unknown Has patient had a PCN reaction occurring within the last 10 years: No If all of the above answers are "NO", then may proceed with Cephalosporin use.        Medication List        Accurate as of December 01, 2022  1:50 PM. If you have any questions, ask your nurse or doctor.          Align 4 MG Caps Take 1 capsule by mouth daily.   B-D UF III MINI PEN NEEDLES 31G X 5 MM Misc Generic drug:  Insulin Pen Needle USE 1  THREE TIMES DAILY TO  INJECT  INSULIN   B-D UF III MINI PEN NEEDLES 31G X 5 MM Misc Generic drug: Insulin Pen Needle USE 1 PEN NEEDLE  THREE TIMES DAILY TO INJECT INSULIN   cetirizine 10 MG tablet Commonly known as: ZYRTEC Take 10 mg by mouth daily as needed for allergies or rhinitis.   empagliflozin 25 MG Tabs tablet Commonly known as: Jardiance Take 1 tablet (25 mg total) by mouth daily before breakfast.   fenofibrate 145 MG tablet Commonly known as: TRICOR Take 1 tablet by mouth once daily   HumaLOG KwikPen 100 UNIT/ML KwikPen Generic drug: insulin lispro INJECT 4 UNITS SUBCUTANEOUSLY TWICE DAILY BEFORE MEALS   hydrochlorothiazide 25 MG tablet Commonly known as: HYDRODIURIL Take 1/2 (one-half) tablet by mouth once daily   imipramine 50 MG tablet Commonly known as: TOFRANIL Take 1 tablet (50 mg total) by mouth at bedtime.   Lantus SoloStar 100 UNIT/ML Solostar Pen Generic drug: insulin glargine INJECT 26 UNITS SUBCUTANEOUSLY ONCE DAILY   levothyroxine 88 MCG tablet Commonly known as: SYNTHROID TAKE 1 TABLET BY MOUTH ONCE DAILY BEFORE BREAKFAST   lisinopril 20 MG tablet Commonly known as: ZESTRIL Take 1 tablet by mouth once daily   metFORMIN 750 MG 24 hr tablet Commonly known as: GLUCOPHAGE-XR Take 1 tablet by mouth twice daily   methylphenidate 20 MG tablet Commonly known as: RITALIN Take 0.5 tablets (10 mg total) by mouth 3 (three) times daily with meals.   OneTouch Delica Lancets 30G Misc USE 1  TO CHECK GLUCOSE ONCE DAILY   OneTouch Verio test strip Generic drug: glucose blood USE 1 STRIP TO CHECK GLUCOSE ONCE DAILY   polyethylene glycol 17 g packet Commonly known as: MIRALAX / GLYCOLAX Take 17 g by mouth daily as needed.   rosuvastatin 20 MG tablet Commonly known as: Crestor Take 1 tablet (20 mg total) by mouth daily.   triamcinolone cream 0.1 % Commonly known as: KENALOG Apply 1 Application topically 2 (two) times  daily. To affected area /insect bite        Allergies:  Allergies  Allergen Reactions   Penicillins Nausea And Vomiting and Rash    Has patient had a PCN reaction causing immediate rash, facial/tongue/throat swelling, SOB or lightheadedness with hypotension: Unknown Has patient had a PCN reaction causing severe rash involving mucus membranes or skin necrosis: Unknown Has patient had a PCN reaction that required hospitalization: Unknown Has patient had a PCN reaction occurring within the last  10 years: No If all of the above answers are "NO", then may proceed with Cephalosporin use.     Past Medical History:  Diagnosis Date   History of ischemic colitis 05/2018   Hyperlipidemia, mixed    Hypersomnia, persistent    Hypertension    Hypothyroidism    endocrinologist--- dr Lucianne Muss   Migraine    Narcolepsy with cataplexy    followed by neurologist--- dr dohmeier   Subcutaneous mass    left shoulder   Type 2 diabetes mellitus treated with insulin [facility])    endocrinologist--- dr a. Lucianne Muss   (12-02-2021  per pt check blood sugar daily 2 hours after evening meal)   Wears glasses    Wears hearing aid in both ears     Past Surgical History:  Procedure Laterality Date   APPENDECTOMY  1948   BIOPSY  05/24/2018   Procedure: BIOPSY;  Surgeon: Lemar Lofty., MD;  Location: [facility] ENDOSCOPY;  Service: Gastroenterology;;   COLONOSCOPY N/A 05/24/2018   Procedure: COLONOSCOPY;  Surgeon: Lemar Lofty., MD;  Location: [facility] ENDOSCOPY;  Service: Gastroenterology;  Laterality: N/A;   MASS EXCISION Left 12/06/2021   Procedure: LEFT SHOULDER SUBCUTANEOUS MASS EXCISION;  Surgeon: Quentin Ore, MD;  Location: [facility];  Service: General;  Laterality: Left;   POSTERIOR CERVICAL FUSION/FORAMINOTOMY  02/05/1975   C5-C6-C7   VAGINAL HYSTERECTOMY  1971    Family History  Problem Relation Age of Onset   Heart disease Mother        MI   Peripheral vascular disease  Mother    Hyperlipidemia Mother    Depression Brother        suicide   Diabetes Brother    Diabetes Brother    Sleep apnea Brother    Sleep apnea Son    Diabetes Son    Heart attack Son    Sudden Cardiac Death Son     Social History:  reports that she quit smoking about 42 years ago. Her smoking use included cigarettes. She has never used smokeless tobacco. She reports that she does not drink alcohol and does not use drugs.   Review of Systems   Lipid history: Has been treated by her PCP with Crestor 20 mg Has had persistently high triglycerides    Lab Results  Component Value Date   CHOL 171 05/08/2022   HDL 63.00 05/08/2022   LDLCALC 69 05/08/2022   LDLDIRECT 110.0 02/12/2022   TRIG 196.0 (H) 05/08/2022   CHOLHDL 3 05/08/2022           Hypertension: Has been treated with HCTZ, 12.5 mg lisinopril 20 mg by her PCP  Blood pressure is usually controlled   BP Readings from Last 3 Encounters:  10/20/22 135/80  09/01/22 120/74  08/05/22 (!) 155/72   Renal function history:  Lab Results  Component Value Date   CREATININE 1.07 08/28/2022   CREATININE 1.11 02/12/2022   CREATININE 1.18 (H) 01/03/2022    Most recent eye exam was in 8/20  Most recent foot exam: 9/23   Hypothyroidism: Taking 88 mcg of levothyroxine since 11/22 No fatigue recently TSH history:   Lab Results  Component Value Date   TSH 2.33 08/28/2022   TSH 3.32 06/18/2021   TSH 5.55 (H) 04/12/2021   FREET4 0.78 04/12/2021   FREET4 0.85 10/18/2020   FREET4 0.95 12/27/2019     Physical Examination:  Pulse 98   Ht 5\' 2"  (1.575 m)   Wt 163 lb 6.4 oz (  74.1 kg)   SpO2 97%   BMI 29.89 kg/m      ASSESSMENT:  Diabetes type 2   See history of present illness for detailed discussion of current diabetes management, blood sugar patterns and problems identified  Her A1c is relatively better at 7.7, previously was up to 8.2  Current regimen: Basal bolus insulin, metformin  Her blood  sugars are variable and she has some readings over 200 morning and midday This is related to inconsistent diet with either higher fat intake, desserts or foods like cereal  Not clear how much she is benefiting from Jardiance 12.5 mg daily but her weight is maintained  HYPERTENSION: Blood pressure is now better controlled, was higher before starting Jardiance  Renal function will be rechecked again, currently on combination of Jardiance, HCTZ and lisinopril  Lipids: To be followed by PCP at the end of the year   PLAN:    Continue same doses of insulin  Needs to check blood sugars after dinner more often Need to control portions especially of high-fat meals when eating out and snacks at night Call if having consistently high readings  No change in blood pressure medications  Follow-up in 3 months  Patient Instructions  Get eye exam  Keep portions and high fat meals   Total visit time including counseling = 30 minutes     Amanda King 12/01/2022, 1:50 PM   Note: This office note was prepared with Dragon voice recognition system technology. Any transcriptional errors that result from this process are unintentional.  LDL 110, likely can do better with Crestor 20 to 40 mg instead of simvastatin, forwarded to Dr. Milinda Antis

## 2022-12-01 NOTE — Patient Instructions (Addendum)
Get eye exam  Keep portions and high fat meals

## 2022-12-07 ENCOUNTER — Other Ambulatory Visit: Payer: Self-pay | Admitting: Endocrinology

## 2022-12-07 DIAGNOSIS — G4733 Obstructive sleep apnea (adult) (pediatric): Secondary | ICD-10-CM | POA: Diagnosis not present

## 2022-12-13 ENCOUNTER — Other Ambulatory Visit: Payer: Self-pay | Admitting: Endocrinology

## 2022-12-13 DIAGNOSIS — E11649 Type 2 diabetes mellitus with hypoglycemia without coma: Secondary | ICD-10-CM

## 2022-12-14 ENCOUNTER — Other Ambulatory Visit: Payer: Self-pay | Admitting: Family Medicine

## 2022-12-15 NOTE — Telephone Encounter (Signed)
Patient has been scheduled

## 2022-12-15 NOTE — Telephone Encounter (Signed)
Last Med refill f/u appt was 01/03/22, pt is due for her yearly CPE (labs prior) or a least another med refill f/u appt. later this month. Please schedule and then route back to me to refill

## 2022-12-26 ENCOUNTER — Ambulatory Visit: Payer: Medicare Other | Admitting: Family Medicine

## 2023-01-01 ENCOUNTER — Other Ambulatory Visit: Payer: Self-pay | Admitting: Endocrinology

## 2023-01-05 ENCOUNTER — Encounter: Payer: Self-pay | Admitting: Neurology

## 2023-01-05 ENCOUNTER — Ambulatory Visit: Payer: Medicare Other | Admitting: Neurology

## 2023-01-05 VITALS — BP 153/72 | HR 90 | Ht 61.0 in | Wt 165.0 lb

## 2023-01-05 DIAGNOSIS — G47411 Narcolepsy with cataplexy: Secondary | ICD-10-CM

## 2023-01-05 DIAGNOSIS — G4733 Obstructive sleep apnea (adult) (pediatric): Secondary | ICD-10-CM | POA: Diagnosis not present

## 2023-01-05 DIAGNOSIS — G4710 Hypersomnia, unspecified: Secondary | ICD-10-CM | POA: Diagnosis not present

## 2023-01-05 MED ORDER — IMIPRAMINE HCL 50 MG PO TABS: 50.0000 mg | ORAL_TABLET | Freq: Every day | ORAL | 3 refills | Status: DC

## 2023-01-05 MED ORDER — METHYLPHENIDATE HCL 20 MG PO TABS
10.0000 mg | ORAL_TABLET | Freq: Three times a day (TID) | ORAL | 0 refills | Status: DC
Start: 2023-01-05 — End: 2023-07-07

## 2023-01-05 NOTE — Progress Notes (Signed)
Provider:  Melvyn Novas, MD  Primary Care Physician:  Tower, Audrie Gallus, MD [address] [place] [address]     Referring Provider: Judy Pimple, Md [address] [place],  [place] [address]          Chief Complaint according to patient   Patient presents with:     New Patient (Initial Visit)           HISTORY OF PRESENT ILLNESS:  Amanda King is a 82 y.o. female patient who is here for revisit 01/05/2023 for  her newly diagnosed OSA on CPAP. Marland Kitchen  Chief concern according to patient :  Amanda King has been smitten by her CPAP machine.   She states that she can now sleep 7  or even  8 hours and she feels so much better.   She has gotten used to it with a nasal mask that covers her nose and is compliantly using CPAP with a great decrease in daytime fatigue and sleepiness.   Her compliance has been 100% for days and hours with an average of 5 hours 50 minutes.  She is using an AutoSet at the suggested setting between 5 and 16 with 3 cm water expiratory relief, her residual AHI is 1.9 down from 18.6 in her home sleep test, she had REM exacerbated apnea so this has truly helped her her 95th percentile pressure is 13.7 cm water she may even benefit in the future from a centimeter more pressure to choose from.  She tries to avoid sleeping in supine.  She seems to get a fairly good air seal as her air leak is only 13.2 L a minute at the 95th percentile.  The patient endorsed only 2 out of 15 points at the geriatric depression scale, 10 out of 24 on the Epworth Sleepiness Scale and 10 out of 63 on the fatigue severity scale.      Amanda King is a 82 y.o. female patient who is here for revisit 08/05/2022 for  Narcolepsy/ Cataplexy treatment and follow up.08-05-2022: Amanda King is seen here in a regular scheduled revisit. Her speech is slightly dysarthric , which she attributes to ill fitting dentures and she is presenting with high blood pressure.  No  headaches, no Vertigo. She reports a cataplexy spell on the anniversary of her sons death, July 12, 2022. He died in 01-16-2021. She has still all his belongings in their old place, a has not emptied his wardrobe etc.  Her husband died 16 years ago.  She reports her medication works well, and she is not interested in changing anything. She is familiar with the medication and she sleeps well.  She may take a power nap. She goes to bed sometime between 10-11 and sleeps 6-7 hours . Her dog wakes her up.    The patient states that  narcolepsy was diagnosed  after a cervical spine surgery in 17-Jan-1988 , she had presented with hypersomnia, sleep hallucinations and with cataplectic attacks. She had been followed by Dr. Buzzy Han , Dr. Meryl Crutch and was Dr. Imagene Gurney patient until his retirement.    " I have a lot of congestion and was given prednisone and now my sugar and BP is messed up".  She was treated for maxillary sinuitis according to Dr Lucretia Roers notes. Her speech is slightly dysarthric , which she attributes to ill fitting dentures and she is presenting with high blood pressure.  Denies  headaches and  Vertigo. She reports a cataplectic spell on the anniversary of her son's death, on 07/26/2022. He died in 01/30/21. She has still all his belongings in their old place, and has not emptied his wardrobe etc. She is grieving. Her husband died 16 years ago. She reports her medication works well, and she is not interested in changing anything. She is familiar with the medication and she sleeps well.  She may take a power nap. She goes to bed sometime between 10-11 PM and sleeps 6-7 hours . Her dog wakes her up.    HST : 09-23-2022. MODEM . This HST confirms the presence of moderate obstructive sleep apnea without any central apnea being noted.  This is a strongly REM sleep dependent apnea and also exacerbated and dependent on supine sleep.   The patient should use positive airway pressure therapy as dental device related mandibular  advancement   or hypoglossal nerve stimulation does not correct REM- sleep-dependent apnea.   She also should avoid sleeping on her back if possible.  Weight loss may additionally help to reduce her apnea but her current BMI is already under 30.       RECOMMENDATION: This patient should use positive airway pressure as an auto titration device and will permit between 5 and 16 cmH2O pressure, 2 cm EPR.  Please note that this patient has full dentures and that it would be very hard to get a facial air seal if these dentures are taken out overnight.    Review of Systems: Out of a complete 14 system review, the patient complains of only the following symptoms, and all other reviewed systems are negative.:  Fatigue, sleepiness , snoring, fragmented sleep, Insomnia.   How likely are you to doze in the following situations: 0 = not likely, 1 = slight chance, 2 = moderate chance, 3 = high chance   Sitting and Reading? Watching Television? Sitting inactive in a public place (theater or meeting)? As a passenger in a car for an hour without a break? Lying down in the afternoon when circumstances permit? Sitting and talking to someone? Sitting quietly after lunch without alcohol? In a car, while stopped for a few minutes in traffic?   The patient endorsed only 2 out of 15 points at the geriatric depression scale, 10 out of 24 on the Epworth Sleepiness Scale and 10 out of 63 on the fatigue severity scale.  Social History   Socioeconomic History   Marital status: Widowed    Spouse name: Not on file   Number of children: 3   Years of education: GED   Highest education level: Not on file  Occupational History   Occupation: works 1 day a week  Tobacco Use   Smoking status: Former    Current packs/day: 0.00    Types: Cigarettes    Start date: 69    Quit date: January 30, 1981    Years since quitting: 42.6   Smokeless tobacco: Never  Vaping Use   Vaping status: Never Used  Substance and Sexual  Activity   Alcohol use: No    Alcohol/week: 0.0 standard drinks of alcohol   Drug use: Never   Sexual activity: Not on file  Other Topics Concern   Not on file  Social History Narrative   Patient is widowed.  Walks for exercise.  Patient has three children.Patient is right-handed.Patient drinks three cups of caffeine daily.  Patient is retired.Patient has a GED.   Social Determinants of Health   Financial  Resource Strain: Low Risk  (09/25/2022)   Overall Financial Resource Strain (CARDIA)    Difficulty of Paying Living Expenses: Not hard at all  Food Insecurity: No Food Insecurity (09/25/2022)   Hunger Vital Sign    Worried About Running Out of Food in the Last Year: Never true    Ran Out of Food in the Last Year: Never true  Transportation Needs: No Transportation Needs (09/25/2022)   PRAPARE - Administrator, Civil Service (Medical): No    Lack of Transportation (Non-Medical): No  Physical Activity: Inactive (09/25/2022)   Exercise Vital Sign    Days of Exercise per Week: 0 days    Minutes of Exercise per Session: 0 min  Stress: No Stress Concern Present (09/25/2022)   [employer] of Occupational Health - Occupational Stress Questionnaire    Feeling of Stress : Not at all  Social Connections: Moderately Isolated (09/25/2022)   Social Connection and Isolation Panel [NHANES]    Frequency of Communication with Friends and Family: More than three times a week    Frequency of Social Gatherings with Friends and Family: More than three times a week    Attends Religious Services: More than 4 times per year    Active Member of [employer] or Organizations: No    Attends Banker Meetings: Never    Marital Status: Widowed    Family History  Problem Relation Age of Onset   Heart disease Mother        MI   Peripheral vascular disease Mother    Hyperlipidemia Mother    Depression Brother        suicide   Diabetes Brother    Diabetes Brother    Sleep apnea  Brother    Sleep apnea Son    Diabetes Son    Heart attack Son    Sudden Cardiac Death Son     Past Medical History:  Diagnosis Date   History of ischemic colitis 05/2018   Hyperlipidemia, mixed    Hypersomnia, persistent    Hypertension    Hypothyroidism    endocrinologist--- dr Lucianne Muss   Migraine    Narcolepsy with cataplexy    followed by neurologist--- dr Joi Leyva   Subcutaneous mass    left shoulder   Type 2 diabetes mellitus treated with insulin [facility])    endocrinologist--- dr a. Lucianne Muss   (12-02-2021  per pt check blood sugar daily 2 hours after evening meal)   Wears glasses    Wears hearing aid in both ears     Past Surgical History:  Procedure Laterality Date   APPENDECTOMY  1948   BIOPSY  05/24/2018   Procedure: BIOPSY;  Surgeon: Lemar Lofty., MD;  Location: [facility] END[facility]COPY;  Service: Gastroenterology;;   COLONOSCOPY N/A 05/24/2018   Procedure: COLONOSCOPY;  Surgeon: Lemar Lofty., MD;  Location[facility] ENDOSCOPY;  Service: Gastroenterology;  Laterality: N/A;   MASS EXCISION Left 12/06/2021   Procedure: LEFT SHOULDER SUBCUTANEOUS MASS EXCISION;  Surgeon: Quentin Ore, MD;  Location: [facility];  Service: General;  Laterality: Left;   POSTERIOR CERVICAL FUSION/FORAMINOTOMY  02/05/1975   C5-C6-C7   VAGINAL HYSTERECTOMY  1971     Current Outpatient Medications on File Prior to Visit  Medication Sig Dispense Refill   cetirizine (ZYRTEC) 10 MG tablet Take 10 mg by mouth daily as needed for allergies or rhinitis.      fenofibrate (TRICOR) 145 MG tablet Take 1 tablet by mouth once daily 90 tablet  1   glucose blood (ONETOUCH VERIO) test strip USE 1 STRIP TO CHECK GLUCOSE ONCE DAILY 100 each 2   HUMALOG KWIKPEN 100 UNIT/ML KwikPen INJECT 4 UNITS SUBCUTANEOUSLY TWICE DAILY BEFORE MEALS 15 mL 0   hydrochlorothiazide (HYDRODIURIL) 25 MG tablet Take 1/2 (one-half) tablet by mouth once daily 45 tablet 0   imipramine (TOFRANIL) 50 MG tablet  Take 1 tablet (50 mg total) by mouth at bedtime. 90 tablet 3   Insulin Pen Needle (B-D UF III MINI PEN NEEDLES) 31G X 5 MM MISC USE 1  THREE TIMES DAILY TO  INJECT  INSULIN 100 each 0   Insulin Pen Needle (B-D UF III MINI PEN NEEDLES) 31G X 5 MM MISC USE 1 PEN NEEDLE  THREE TIMES DAILY TO INJECT INSULIN 100 each 2   JARDIANCE 25 MG TABS tablet TAKE 1 TABLET BY MOUTH ONCE DAILY BEFORE BREAKFAST 30 tablet 0   LANTUS SOLOSTAR 100 UNIT/ML Solostar Pen INJECT 26 UNITS SUBCUTANEOUSLY ONCE DAILY 15 mL 3   levothyroxine (SYNTHROID) 88 MCG tablet TAKE 1 TABLET BY MOUTH ONCE DAILY BEFORE BREAKFAST 90 tablet 0   lisinopril (ZESTRIL) 20 MG tablet Take 1 tablet by mouth once daily 90 tablet 0   metFORMIN (GLUCOPHAGE-XR) 750 MG 24 hr tablet Take 1 tablet by mouth twice daily 180 tablet 0   methylphenidate (RITALIN) 20 MG tablet Take 0.5 tablets (10 mg total) by mouth 3 (three) times daily with meals. 135 tablet 0   OneTouch Delica Lancets 30G MISC USE 1  TO CHECK GLUCOSE ONCE DAILY 100 each 0   polyethylene glycol (MIRALAX / GLYCOLAX) packet Take 17 g by mouth daily as needed. 14 each 0   Probiotic Product (ALIGN) 4 MG CAPS Take 1 capsule by mouth daily.     rosuvastatin (CRESTOR) 20 MG tablet Take 1 tablet (20 mg total) by mouth daily. 90 tablet 3   triamcinolone cream (KENALOG) 0.1 % Apply 1 Application topically 2 (two) times daily. To affected area /insect bite 15 g 0   No current facility-administered medications on file prior to visit.    Allergies  Allergen Reactions   Penicillins Nausea And Vomiting and Rash    Has patient had a PCN reaction causing immediate rash, facial/tongue/throat swelling, SOB or lightheadedness with hypotension: Unknown Has patient had a PCN reaction causing severe rash involving mucus membranes or skin necrosis: Unknown Has patient had a PCN reaction that required hospitalization: Unknown Has patient had a PCN reaction occurring within the last 10 years: No If all of the  above answers are "NO", then may proceed with Cephalosporin use.      DIAGNOSTIC DATA (LABS, IMAGING, TESTING) - I reviewed patient records, labs, notes, testing and imaging myself where available.  Lab Results  Component Value Date   WBC 9.0 01/03/2022   HGB 14.6 01/03/2022   HCT 42.1 01/03/2022   MCV 86.3 01/03/2022   PLT 338 01/03/2022      Component Value Date/Time   NA 137 08/28/2022 0921   K 4.2 08/28/2022 0921   CL 99 08/28/2022 0921   CO2 30 08/28/2022 0921   GLUCOSE 122 (H) 08/28/2022 0921   BUN 27 (H) 08/28/2022 0921   CREATININE 1.07 08/28/2022 0921   CREATININE 1.18 (H) 01/03/2022 1615   CALCIUM 9.6 08/28/2022 0921   PROT 7.5 01/03/2022 1615   ALBUMIN 4.7 11/12/2021 1156   AST 26 05/08/2022 0916   ALT 27 05/08/2022 0916   ALKPHOS 54 11/12/2021 1156   BILITOT  0.4 01/03/2022 1615   GFRNONAA >60 05/24/2018 0454   GFRAA >60 05/24/2018 0454   Lab Results  Component Value Date   CHOL 171 05/08/2022   HDL 63.00 05/08/2022   LDLCALC 69 05/08/2022   LDLDIRECT 110.0 02/12/2022   TRIG 196.0 (H) 05/08/2022   CHOLHDL 3 05/08/2022   Lab Results  Component Value Date   HGBA1C 7.7 (A) 12/01/2022   No results found for: "VITAMINB12" Lab Results  Component Value Date   TSH 2.33 08/28/2022    PHYSICAL EXAM:  Today's Vitals   01/05/23 1317  BP: (!) 153/72  Pulse: 90  Weight: 165 lb (74.8 kg)  Height: 5\' 1"  (1.549 m)   Body mass index is 31.18 kg/m.   Wt Readings from Last 3 Encounters:  01/05/23 165 lb (74.8 kg)  12/01/22 163 lb 6.4 oz (74.1 kg)  10/20/22 164 lb (74.4 kg)     Ht Readings from Last 3 Encounters:  01/05/23 5\' 1"  (1.549 m)  12/01/22 5\' 2"  (1.575 m)  10/20/22 5\' 1"  (1.549 m)      General: The patient is awake, alert and appears not in acute distress. The patient is well groomed. Head: Normocephalic, atraumatic. Neck is supple. Mallampati 3 , neck circumference:15.5 . Cardiovascular:  Regular rate and rhythm, without  murmurs or  carotid bruit, and without distended neck veins. Respiratory: Lungs are clear to auscultation. Skin:  Without evidence of edema,facial rash-  Trunk: BMI is 30 normal posture.   Neurologic exam : The patient is awake and alert, oriented to place and time.  Memory subjective  described as intact.  There is a normal attention span & concentration ability.    Cranial nerves: No lossof taste or smell. Pupils are equal and briskly reactive to light. Funduscopic exam without  evidence of pallor or edema.Extraocular movements  in vertical and horizontal planes intact and without nystagmus. Visual fields by finger perimetry are intact. Hearing to finger rub is impaired with hearing aids in place .  Facial sensation intact to fine touch.  Facial motor strength: Left eye ptosis,  " lazy eye"  . No diplopia.  tongue and uvula move midline. Eyes water.  Motor exam:   Normal tone and normal muscle bulk and symmetric normal strength in all extremities. No sensory abnormality to pin prick.     ASSESSMENT AND PLAN 82 y.o. year old female  here with:  Patient with good resolution on Tofranil.  Amanda King carries  the diagnosis of narcolepsy with cataplexy.  Ritalin in daytime , works good at current dose.      HLA testing for narcolepsy with cataplexy was positive for HLA - she has clincally all symptoms and she is doing better on Tofranil and Ritalin, but was denied XYREM,    Medicare coverage had failed for this patient.  She had once not taking the medication for 4 days and suffered from severe sleep a hallucinations, vivid and scary dreams came back. I will Refill Tofranil, for cataplexy treatment.  Refilled Ritalin, prn , but usually twice daily for hypersomnia control..   She reports less vivid dreams on these medication.   Resolution of sleepiness and fatigue after discovery of moderate SLEEP Apnea - now on CPAP auto titration device, nasal mask. The patient endorsed only 2 out of 15 points  at the geriatric depression scale, 10 out of 24 on the Epworth Sleepiness Scale and 10 out of 63 on the fatigue severity scale.    1) patient long term  established in the office for 30 years - with narcolepsy cataplexy, on tofranil  Now additional dx of OSA and she is very happy and compliant with CPAP therapy.   2) see above the now low Epworth score and FSS - and her recovery from grief / perhaps some  depression.   3) hearing loss.      Sleep clinic follow up through our NP within 6 months.   I would like to thank Tower, Audrie Gallus, MD  for allowing me to meet with and to take care of this pleasant patient.    After spending a total time of  20  minutes face to face and additional time for physical and neurologic examination, review of laboratory studies,  personal review of imaging studies, reports and results of other testing and review of referral information / records as far as provided in visit,   Electronically signed by: Melvyn Novas, MD 01/05/2023 1:53 PM  [facility] Board certified by The [employer] of Sleep Medicine and Diplomate of the [employer] of Sleep Medicine. Board certified In Neurology through the ABPN, Fellow of the [employer] of Neurology.

## 2023-01-05 NOTE — Patient Instructions (Signed)
Living With Sleep Apnea Sleep apnea is a condition in which breathing pauses or becomes shallow during sleep. Sleep apnea is most commonly caused by a collapsed or blocked airway. People with sleep apnea usually snore loudly. They may have times when they gasp and stop breathing for 10 seconds or more during sleep. This may happen many times during the night. The breaks in breathing also interrupt the deep sleep that you need to feel rested. Even if you do not completely wake up from the gaps in breathing, your sleep may not be restful and you feel tired during the day. You may also have a headache in the morning and low energy during the day, and you may feel anxious or depressed. How can sleep apnea affect me? Sleep apnea increases your chances of extreme tiredness during the day (daytime fatigue). It can also increase your risk for health conditions, such as: Heart attack. Stroke. Obesity. Type 2 diabetes. Heart failure. Irregular heartbeat. High blood pressure. If you have daytime fatigue as a result of sleep apnea, you may be more likely to: Perform poorly at school or work. Fall asleep while driving. Have difficulty with attention. Develop depression or anxiety. Have sexual dysfunction. What actions can I take to manage sleep apnea? Sleep apnea treatment  If you were given a device to open your airway while you sleep, use it only as told by your health care provider. You may be given: An oral appliance. This is a custom-made mouthpiece that shifts your lower jaw forward. A continuous positive airway pressure (CPAP) device. This device blows air through a mask when you breathe out (exhale). A nasal expiratory positive airway pressure (EPAP) device. This device has valves that you put into each nostril. A bi-level positive airway pressure (BIPAP) device. This device blows air through a mask when you breathe in (inhale) and breathe out (exhale). You may need surgery if other treatments  do not work for you. Sleep habits Go to sleep and wake up at the same time every day. This helps set your internal clock (circadian rhythm) for sleeping. If you stay up later than usual, such as on weekends, try to get up in the morning within 2 hours of your normal wake time. Try to get at least 7-9 hours of sleep each night. Stop using a computer, tablet, and mobile phone a few hours before bedtime. Do not take long naps during the day. If you nap, limit it to 30 minutes. Have a relaxing bedtime routine. Reading or listening to music may relax you and help you sleep. Use your bedroom only for sleep. Keep your television and computer out of your bedroom. Keep your bedroom cool, dark, and quiet. Use a supportive mattress and pillows. Follow your health care provider's instructions for other changes to sleep habits. Nutrition Do not eat heavy meals in the evening. Do not have caffeine in the later part of the day. The effects of caffeine can last for more than 5 hours. Follow your health care provider's or dietitian's instructions for any diet changes. Lifestyle     Do not drink alcohol before bedtime. Alcohol can cause you to fall asleep at first, but then it can cause you to wake up in the middle of the night and have trouble getting back to sleep. Do not use any products that contain nicotine or tobacco. These products include cigarettes, chewing tobacco, and vaping devices, such as e-cigarettes. If you need help quitting, ask your health care provider. Medicines Take  over-the-counter and prescription medicines only as told by your health care provider. Do not use over-the-counter sleep medicine. You can become dependent on this medicine, and it can make sleep apnea worse. Do not use medicines, such as sedatives and narcotics, unless told by your health care provider. Activity Exercise on most days, but avoid exercising in the evening. Exercising near bedtime can interfere with  sleeping. If possible, spend time outside every day. Natural light helps regulate your circadian rhythm. General information Lose weight if you need to, and maintain a healthy weight. Keep all follow-up visits. This is important. If you are having surgery, make sure to tell your health care provider that you have sleep apnea. You may need to bring your device with you. Where to find more information Learn more about sleep apnea and daytime fatigue from: American Sleep Association: sleepassociation.org National Sleep Foundation: sleepfoundation.org National Heart, Lung, and Blood Institute: [employer].gl Summary Sleep apnea is a condition in which breathing pauses or becomes shallow during sleep. Sleep apnea can cause daytime fatigue and other serious health conditions. You may need to wear a device while sleeping to help keep your airway open. If you are having surgery, make sure to tell your health care provider that you have sleep apnea. You may need to bring your device with you. Making changes to sleep habits, diet, lifestyle, and activity can help you manage sleep apnea. This information is not intended to replace advice given to you by your health care provider. Make sure you discuss any questions you have with your health care provider. Document Revised: 01/02/2021 Document Reviewed: 05/04/2020 Elsevier Patient Education  2023 Elsevier Inc. Narcolepsy Narcolepsy is a disorder that causes people to fall asleep suddenly and without control (have sleep attacks) during the daytime. It is a lifelong disorder. Narcolepsy disrupts the sleep cycle at night, which then causes daytime sleepiness. What are the causes? The cause of narcolepsy is not fully understood, but it may be related to: Low levels of hypocretin, a chemical (neurotransmitter) in the brain that controls sleep and wake cycles. Hypocretin imbalance may be caused by: Abnormal genes that are passed from parent to child  (inherited). An autoimmune disease in which the body's defense system (immune system) attacks the brain cells that make hypocretin. Infection, tumor, or injury in the area of the brain that controls sleep. Exposure to poisons (toxins), such as heavy metals, pesticides, and secondhand smoke. What are the signs or symptoms? Symptoms of this condition include: Excessive daytime sleepiness. This is the most common symptom and is usually the first symptom you will notice. This may affect your performance at work or school. Sleep attacks. You may fall asleep in the middle of an activity, especially low-energy activities like reading or watching TV. Feeling like you cannot think clearly and having trouble focusing or remembering things. You may also feel depressed. Sudden muscle weakness (cataplexy). When this occurs, your speech may become slurred, or your knees may buckle. Cataplexy is usually triggered by surprise, anger, fear, or laughter. Losing the ability to speak or move (sleep paralysis). This may occur just as you start to fall asleep or wake up. You will be aware of the paralysis. It usually lasts for just a few seconds or minutes. Seeing, hearing, tasting, smelling, or feeling things that are not real (hallucinations). Hallucinations may occur with sleep paralysis. They can happen when you are falling asleep, waking up, or dozing. Trouble staying asleep at night (insomnia) and restless sleep. How is this diagnosed? This  condition may be diagnosed based on: A physical exam to rule out any other problems that may be causing your symptoms. You may be asked to write down your sleeping patterns for several weeks in a sleep diary. This will help your health care provider make a diagnosis. Sleep studies that measure how well your REM sleep is regulated. These tests also measure your heart rate, breathing, movement, and brain waves. These tests include: An overnight sleep study (polysomnogram). A  daytime sleep study that is done while you take several naps during the day (multiple sleep latency test, MSLT). This test measures how quickly you fall asleep and how quickly you enter REM sleep. Removal of spinal fluid to measure hypocretin levels. How is this treated? There is no cure for this condition, but treatment can help relieve symptoms. Treatment may include: Lifestyle and sleeping strategies to help you cope with the condition, such as: Exercising regularly. Maintaining a regular sleep schedule. Avoiding caffeine and large meals before bed. Medicines. These may include: Medicines that help keep you awake and alert (stimulants) to fight daytime sleepiness. Medicines that treat depression (antidepressants). These may be used to treat cataplexy. Sodium oxybate. This is a strong medicine to help you relax (sedative) that you may take at night. It can help control daytime sleepiness and cataplexy. Other treatments may include mental health counseling or joining a support group. Follow these instructions at home: Sleeping habits  Get about 8 hours of sleep every night. Go to sleep and get up at about the same time every day. Keep your bedroom dark, quiet, and comfortable. When you feel very tired, take short naps. Schedule naps so that you take them at about the same time every day. Before bedtime: Avoid bright lights and screens. Relax. Try activities like reading or taking a warm bath. Activity Get at least 20 minutes of exercise every day. This will help you sleep better at night and reduce daytime sleepiness. Avoid exercising within 3 hours of bedtime. Do not drive or use machinery if you are sleepy. If possible, take a nap before driving. Do not swim or go out on the water without a life jacket. Eating and drinking Do not drink alcohol or caffeinated beverages within 4-5 hours of bedtime. Do not eat a large meal before bedtime. Eat meals at about the same times every  day. General instructions  Take over-the-counter and prescription medicines only as told by your health care provider. Keep a sleep diary as told by your health care provider. Tell your employer or teachers that you have narcolepsy. You may be able to adjust your schedule to include time for naps. Do not use any products that contain nicotine or tobacco. These products include cigarettes, chewing tobacco, and vaping devices, such as e-cigarettes. If you need help quitting, ask your health care provider. Where to find more information [employer] of Neurological Disorders and Stroke: BasicFM.no Contact a health care provider if: Your symptoms are not getting better. You have fast or irregular heartbeats (palpitations). You are having a hard time determining what is real and what is not (psychosis). Get help right away if: You hurt yourself during a sleep attack or an attack of cataplexy. You have chest pain. You have trouble breathing. These symptoms may be an emergency. Get help right away. Call 911. Do not wait to see if the symptoms will go away. Do not drive yourself to the hospital. Summary Narcolepsy is a disorder that causes people to fall asleep suddenly and  without control during the daytime (sleep attacks). Narcolepsy is a lifelong disorder with no cure. Treatment can help relieve symptoms. Go to sleep and get up at about the same time every day. Follow instructions about sleep and activities as told by your health care provider. Take over-the-counter and prescription medicines only as told by your health care provider. This information is not intended to replace advice given to you by your health care provider. Make sure you discuss any questions you have with your health care provider. Document Revised: 09/27/2021 Document Reviewed: 09/27/2021 Elsevier Patient Education  2024 [employer].

## 2023-01-06 DIAGNOSIS — G4733 Obstructive sleep apnea (adult) (pediatric): Secondary | ICD-10-CM | POA: Diagnosis not present

## 2023-01-07 ENCOUNTER — Other Ambulatory Visit: Payer: Self-pay | Admitting: Endocrinology

## 2023-01-10 DIAGNOSIS — H524 Presbyopia: Secondary | ICD-10-CM | POA: Diagnosis not present

## 2023-01-10 DIAGNOSIS — H40033 Anatomical narrow angle, bilateral: Secondary | ICD-10-CM | POA: Diagnosis not present

## 2023-01-10 DIAGNOSIS — E119 Type 2 diabetes mellitus without complications: Secondary | ICD-10-CM | POA: Diagnosis not present

## 2023-01-15 ENCOUNTER — Other Ambulatory Visit: Payer: Self-pay | Admitting: Endocrinology

## 2023-01-15 DIAGNOSIS — E11649 Type 2 diabetes mellitus with hypoglycemia without coma: Secondary | ICD-10-CM

## 2023-01-16 ENCOUNTER — Other Ambulatory Visit: Payer: Self-pay | Admitting: Endocrinology

## 2023-02-03 ENCOUNTER — Other Ambulatory Visit: Payer: Self-pay | Admitting: Family Medicine

## 2023-02-04 ENCOUNTER — Other Ambulatory Visit: Payer: Self-pay | Admitting: Family Medicine

## 2023-02-04 NOTE — Telephone Encounter (Signed)
Pt is overdue for her CPE or at least a f/u: med refill, please schedule and then route back to me

## 2023-02-05 NOTE — Telephone Encounter (Signed)
 Spoke to pt, scheduled cpe for 02/27/23

## 2023-02-06 DIAGNOSIS — G4733 Obstructive sleep apnea (adult) (pediatric): Secondary | ICD-10-CM | POA: Diagnosis not present

## 2023-02-10 ENCOUNTER — Other Ambulatory Visit: Payer: Self-pay | Admitting: Endocrinology

## 2023-02-17 ENCOUNTER — Other Ambulatory Visit: Payer: Self-pay

## 2023-02-17 ENCOUNTER — Telehealth: Payer: Self-pay | Admitting: Endocrinology

## 2023-02-17 DIAGNOSIS — Z794 Long term (current) use of insulin: Secondary | ICD-10-CM

## 2023-02-17 MED ORDER — BD PEN NEEDLE MINI U/F 31G X 5 MM MISC
0 refills | Status: DC
Start: 2023-02-17 — End: 2024-01-11

## 2023-02-17 NOTE — Telephone Encounter (Signed)
MEDICATION: B-D UF III MINI PEN NEEDLES Insulin Pen Needle (B-D UF III MINI PEN NEEDLES) 31G X 5 MM MISC   PHARMACY:    [facility] - [place] ([place] - [address] (Ph: [phone])  Pen Needles has been sent to [facility]

## 2023-02-17 NOTE — Telephone Encounter (Signed)
MEDICATION: B-D UF III MINI PEN NEEDLES Insulin Pen Needle (B-D UF III MINI PEN NEEDLES) 31G X 5 MM MISC  PHARMACY:    [facility] - [place]  - [address] (Ph: [phone])    HAS THE PATIENT CONTACTED THEIR PHARMACY?  Yes  IS THIS A 90 DAY SUPPLY : Yes  IS PATIENT OUT OF MEDICATION: Yes  IF NOT; HOW MUCH IS LEFT:   LAST APPOINTMENT DATE: @6 /24/24  NEXT APPOINTMENT DATE:@11 /10/2022  DO WE HAVE YOUR PERMISSION TO LEAVE A DETAILED MESSAGE?:Yes  OTHER COMMENTS:    **Let patient know to contact pharmacy at the end of the day to make sure medication is ready. **  ** Please notify patient to allow 48-72 hours to process**  **Encourage patient to contact the pharmacy for refills or they can request refills through [facility]**

## 2023-02-18 DIAGNOSIS — G4733 Obstructive sleep apnea (adult) (pediatric): Secondary | ICD-10-CM | POA: Diagnosis not present

## 2023-02-27 ENCOUNTER — Encounter: Payer: Medicare Other | Admitting: Family Medicine

## 2023-03-05 ENCOUNTER — Encounter: Payer: Self-pay | Admitting: Family Medicine

## 2023-03-05 ENCOUNTER — Ambulatory Visit: Payer: Medicare Other | Admitting: Family Medicine

## 2023-03-05 VITALS — BP 132/70 | HR 96 | Ht 62.0 in | Wt 157.9 lb

## 2023-03-05 DIAGNOSIS — E1169 Type 2 diabetes mellitus with other specified complication: Secondary | ICD-10-CM | POA: Diagnosis not present

## 2023-03-05 DIAGNOSIS — E785 Hyperlipidemia, unspecified: Secondary | ICD-10-CM | POA: Diagnosis not present

## 2023-03-05 DIAGNOSIS — I10 Essential (primary) hypertension: Secondary | ICD-10-CM

## 2023-03-05 DIAGNOSIS — Z1231 Encounter for screening mammogram for malignant neoplasm of breast: Secondary | ICD-10-CM

## 2023-03-05 DIAGNOSIS — E039 Hypothyroidism, unspecified: Secondary | ICD-10-CM

## 2023-03-05 DIAGNOSIS — Z0000 Encounter for general adult medical examination without abnormal findings: Secondary | ICD-10-CM | POA: Diagnosis not present

## 2023-03-05 LAB — COMPREHENSIVE METABOLIC PANEL
ALT: 23 U/L (ref 0–35)
AST: 20 U/L (ref 0–37)
Albumin: 4.4 g/dL (ref 3.5–5.2)
Alkaline Phosphatase: 64 U/L (ref 39–117)
BUN: 22 mg/dL (ref 6–23)
CO2: 30 mEq/L (ref 19–32)
Calcium: 9.5 mg/dL (ref 8.4–10.5)
Chloride: 101 mEq/L (ref 96–112)
Creatinine, Ser: 1.09 mg/dL (ref 0.40–1.20)
GFR: 47.04 mL/min — ABNORMAL LOW (ref >60.00)
Glucose, Bld: 147 mg/dL — ABNORMAL HIGH (ref 70–99)
Potassium: 3.9 mEq/L (ref 3.5–5.1)
Sodium: 139 mEq/L (ref 135–145)
Total Bilirubin: 0.3 mg/dL (ref 0.2–1.2)
Total Protein: 7.2 g/dL (ref 6.0–8.3)

## 2023-03-05 LAB — LIPID PANEL
Cholesterol: 158 mg/dL (ref 0–200)
HDL: 56.2 mg/dL (ref >39.00)
LDL Cholesterol: 68 mg/dL (ref 0–99)
NonHDL: 102.15
Total CHOL/HDL Ratio: 3
Triglycerides: 173 mg/dL — ABNORMAL HIGH (ref 0.0–149.0)
VLDL: 34.6 mg/dL (ref 0.0–40.0)

## 2023-03-05 LAB — CBC WITH DIFFERENTIAL/PLATELET
Basophils Absolute: 0 10*3/uL (ref 0.0–0.1)
Basophils Relative: 0.5 % (ref 0.0–3.0)
Eosinophils Absolute: 0.2 10*3/uL (ref 0.0–0.7)
Eosinophils Relative: 2.7 % (ref 0.0–5.0)
HCT: 42.1 % (ref 36.0–46.0)
Hemoglobin: 13.7 g/dL (ref 12.0–15.0)
Lymphocytes Relative: 27.2 % (ref 12.0–46.0)
Lymphs Abs: 2 10*3/uL (ref 0.7–4.0)
MCHC: 32.5 g/dL (ref 30.0–36.0)
MCV: 89.9 fl (ref 78.0–100.0)
Monocytes Absolute: 0.5 10*3/uL (ref 0.1–1.0)
Monocytes Relative: 7.2 % (ref 3.0–12.0)
Neutro Abs: 4.6 10*3/uL (ref 1.4–7.7)
Neutrophils Relative %: 62.4 % (ref 43.0–77.0)
Platelets: 293 10*3/uL (ref 150.0–400.0)
RBC: 4.69 Mil/uL (ref 3.87–5.11)
RDW: 13.9 % (ref 11.5–15.5)
WBC: 7.4 10*3/uL (ref 4.0–10.5)

## 2023-03-05 LAB — TSH: TSH: 3.51 u[IU]/mL (ref 0.35–5.50)

## 2023-03-05 NOTE — Patient Instructions (Addendum)
If you are interested in the new shingles vaccine (Shingrix) - call your local pharmacy to check on coverage and availability  You are also due for a tetanus shot   Keep exercising  Add some strength training to your routine, this is important for bone and brain health and can reduce your risk of falls and help your body use insulin properly and regulate weight  Light weights, exercise bands , and internet videos are a good way to start  Yoga (chair or regular), machines , floor exercises or a gym with machines are also good options      You have an order for:  []   2D Mammogram  [x]   3D Mammogram  []   Bone Density     Please call for appointment:   [x]   [facility]  [address] [place] [address]  [phone]  []   [facility])   [address]  [place], [place] [address]  [phone]  []   [facility]      [address] [place], [place]        [phone]         []   [facility]  [address] [place], [place]  [phone]  []  [facility]   [address]   [place], [place] [address]  [phone]  []  [facility]  [address] [place], [place] [address] [phone]    Make sure to wear two piece clothing  No lotions powders or deodorants the day of the appointment Make sure to bring picture ID and insurance card.  Bring list of medications you are currently taking including any supplements.   Schedule your screening mammogram through MyChart!   Select [place] imaging sites can now be scheduled through MyChart.  Log into your MyChart account.  Go to 'Visit' (or 'Appointments' if  on mobile App) --> Schedule an  Appointment  Under 'Select a Reason for Visit' choose the Mammogram  Screening  option.  Complete the pre-visit questions  and select the time and place that  best fits your schedule

## 2023-03-05 NOTE — Assessment & Plan Note (Signed)
Mammogram ordered Pt promises to call and schedule it this year

## 2023-03-05 NOTE — Assessment & Plan Note (Signed)
bp in fair control at this time  BP Readings from Last 1 Encounters:  03/05/23 132/70   No changes needed Most recent labs reviewed  Disc lifstyle change with low sodium diet and exercise  Plan to continue lisinopril 20 mg daily  hctz 12.5 mg daily  Labs ordered

## 2023-03-05 NOTE — Progress Notes (Signed)
Subjective:    Patient ID: Amanda King, female    DOB: [birth year], 82 y.o.   MRN: [id number]  HPI  Here for health maintenance exam and to review chronic medical problems   Wt Readings from Last 3 Encounters:  03/05/23 157 lb 14.4 oz (71.6 kg)  01/05/23 165 lb (74.8 kg)  12/01/22 163 lb 6.4 oz (74.1 kg)   28.88 kg/m  Vitals:   03/05/23 1054 03/05/23 1131  BP: (!) 142/84 132/70  Pulse: 96   SpO2: 98%     Immunization History  Administered Date(s) Administered   Fluad Quad(high Dose 65+) 02/22/2019, 04/16/2021   Influenza Split 03/10/2011   Influenza Whole 06/09/2002, 04/09/2007, 03/02/2008, 03/23/2009, 04/09/2010   Influenza, High Dose Seasonal PF 05/13/2017, 03/09/2018, 04/06/2020, 04/03/2022   Influenza,inj,Quad PF,6+ Mos 04/30/2015, 02/15/2016   Influenza-Unspecified 03/07/2014, 03/01/2023   PFIZER(Purple Top)SARS-COV-2 Vaccination 09/03/2019, 09/24/2019   Pneumococcal Conjugate-13 08/03/2015   Pneumococcal Polysaccharide-23 06/09/2002, 03/02/2008   Respiratory Syncytial Virus Vaccine,Recomb Aduvanted(Arexvy) 04/03/2022   Td 04/25/1997   Tdap 07/01/2011    Health Maintenance Due  Topic Date Due   Zoster Vaccines- Shingrix (1 of 2) Never done   MAMMOGRAM  10/12/2014   DTaP/Tdap/Td (3 - Td or Tdap) 06/30/2021   Diabetic kidney evaluation - Urine ACR  11/13/2022   FOOT EXAM  02/13/2023    Shingrix- plans to get at pharmacy   Tetanus shot - will get at pharmacy   Mammogram 2015 - wants to get  Will schedule   Self breast exam: no lumps   Gyn health-no problems   Had eye exam  New glasses New covid and flu shots   OSA- using cpap  Sleeping better  Losing weight    Colon cancer screening  colonoscopy 2019   Bone health  Dexa - 09/2013 / declines another one  Falls- none  Fractures-none  Supplements -vitamin D  Last vitamin D  Exercise -uses a floor pedaler  Using weighs for upper body also  Wants to get stronger   Mood    09/25/2022    11:50 AM 01/03/2022    3:27 PM 09/17/2021   11:44 AM 05/24/2020   10:30 AM 03/31/2019    9:10 AM  Depression screen PHQ 2/9  Decreased Interest 0 0 0 0 0  Down, Depressed, Hopeless 0 0 0 0 0  PHQ - 2 Score 0 0 0 0 0  Altered sleeping     0  Tired, decreased energy     0  Change in appetite     0  Feeling bad or failure about yourself      0  Trouble concentrating     0  Moving slowly or fidgety/restless     0  Suicidal thoughts     0  PHQ-9 Score     0  Difficult doing work/chores     Not difficult at all    HTN bp is stable today  No cp or palpitations or headaches or edema  No side effects to medicines  BP Readings from Last 3 Encounters:  03/05/23 132/70  01/05/23 (!) 153/72  10/20/22 135/80    Lisinopril 20 mg daily  Hydrochlorothiazide 12.5 mg daily   Has been well controlled    Hyperlipidemia  Lab Results  Component Value Date   CHOL 171 05/08/2022   HDL 63.00 05/08/2022   LDLCALC 69 05/08/2022   LDLDIRECT 110.0 02/12/2022   TRIG 196.0 (H) 05/08/2022   CHOLHDL 3 05/08/2022   Crestor  20 mg daily  Tricor 145 mg daily   Dm2 Sees endocrinology Dr Lucianne Muss  A1c was 7.7 in June  Has new provider soon -is retiring   Hypothyroid Lab Results  Component Value Date   TSH 2.33 08/28/2022   Sees endocrinology  Levothyroxine 88 mcg daily     Patient Active Problem List   Diagnosis Date Noted   OSA on CPAP 01/05/2023   At risk for obstructive sleep apnea 08/05/2022   Snoring 08/05/2022   Productive cough 06/11/2022   Allergic rhinitis 04/21/2022   Encounter for screening mammogram for breast cancer 01/03/2022   Grief reaction 03-02-2021   Family history of sudden cardiac death March 02, 2021   Localized swelling, mass and lump, neck 03/02/21   History of ischemic colitis    Constipation    Routine general medical examination at a health care facility 01/31/2016   Narcolepsy with cataplexy 10/31/2014   Encounter for Medicare annual wellness exam  09/22/2013   Hypersomnia, persistent 07/15/2013   Colon cancer screening 12/31/2011   ANXIETY 05/06/2010   DERMATOPHYTOSIS OF NAIL 12/21/2008   Hypothyroidism 09/02/2006   Diabetes type 2, uncontrolled 09/02/2006   Hyperlipidemia associated with type 2 diabetes mellitus (HCC) 09/02/2006   NARCOLEPSY W/CATAPLEXY 09/02/2006   Essential hypertension 09/02/2006   PNEUMONIA, HX OF 09/02/2006   Past Medical History:  Diagnosis Date   History of ischemic colitis 05/2018   Hyperlipidemia, mixed    Hypersomnia, persistent    Hypertension    Hypothyroidism    endocrinologist--- dr Lucianne Muss   Migraine    Narcolepsy with cataplexy    followed by neurologist--- dr dohmeier   Subcutaneous mass    left shoulder   Type 2 diabetes mellitus treated with insulin [facility])    endocrinologist--- dr a. Lucianne Muss   (12-02-2021  per pt check blood sugar daily 2 hours after evening meal)   Wears glasses    Wears hearing aid in both ears    Past Surgical History:  Procedure Laterality Date   APPENDECTOMY  1948   BIOPSY  05/24/2018   Procedure: BIOPSY;  Surgeon: Lemar Lofty., MD;  Location: [facility] ENDOSCOPY;  Service: Gastroenterology;;   COLONOSCOPY N/A 05/24/2018   Procedure: COLONOSCOPY;  Surgeon: Lemar Lofty., MD;  Location: [facility] ENDOSCOPY;  Service: Gastroenterology;  Laterality: N/A;   MASS EXCISION Left 12/06/2021   Procedure: LEFT SHOULDER SUBCUTANEOUS MASS EXCISION;  Surgeon: Quentin Ore, MD;  Location: [facility];  Service: General;  Laterality: Left;   POSTERIOR CERVICAL FUSION/FORAMINOTOMY  03/03/75   C5-C6-C7   VAGINAL HYSTERECTOMY  1971   Social History   Tobacco Use   Smoking status: Former    Current packs/day: 0.00    Types: Cigarettes    Start date: 79    Quit date: 1982    Years since quitting: 42.7   Smokeless tobacco: Never  Vaping Use   Vaping status: Never Used  Substance Use Topics   Alcohol use: No    Alcohol/week: 0.0 standard  drinks of alcohol   Drug use: Never   Family History  Problem Relation Age of Onset   Heart disease Mother        MI   Peripheral vascular disease Mother    Hyperlipidemia Mother    Depression Brother        suicide   Diabetes Brother    Diabetes Brother    Sleep apnea Brother    Sleep apnea Son    Diabetes Son  Heart attack Son    Sudden Cardiac Death Son    Allergies  Allergen Reactions   Penicillins Nausea And Vomiting and Rash    Has patient had a PCN reaction causing immediate rash, facial/tongue/throat swelling, SOB or lightheadedness with hypotension: Unknown Has patient had a PCN reaction causing severe rash involving mucus membranes or skin necrosis: Unknown Has patient had a PCN reaction that required hospitalization: Unknown Has patient had a PCN reaction occurring within the last 10 years: No If all of the above answers are "NO", then may proceed with Cephalosporin use.    Current Outpatient Medications on File Prior to Visit  Medication Sig Dispense Refill   cetirizine (ZYRTEC) 10 MG tablet Take 10 mg by mouth daily as needed for allergies or rhinitis.      empagliflozin (JARDIANCE) 25 MG TABS tablet TAKE 1 TABLET BY MOUTH ONCE DAILY BEFORE BREAKFAST 30 tablet 3   fenofibrate (TRICOR) 145 MG tablet Take 1 tablet by mouth once daily 90 tablet 1   glucose blood (ONETOUCH VERIO) test strip USE 1 STRIP TO CHECK GLUCOSE ONCE DAILY 100 each 2   HUMALOG KWIKPEN 100 UNIT/ML KwikPen INJECT 4 UNITS SUBCUTANEOUSLY TWICE DAILY BEFORE MEALS 15 mL 0   hydrochlorothiazide (HYDRODIURIL) 25 MG tablet Take 1/2 (one-half) tablet by mouth once daily 45 tablet 2   imipramine (TOFRANIL) 50 MG tablet Take 1 tablet (50 mg total) by mouth at bedtime. 90 tablet 3   Insulin Pen Needle (B-D UF III MINI PEN NEEDLES) 31G X 5 MM MISC USE 1 PEN NEEDLE  THREE TIMES DAILY TO INJECT INSULIN 100 each 2   Insulin Pen Needle (B-D UF III MINI PEN NEEDLES) 31G X 5 MM MISC USE 1  THREE TIMES DAILY TO   INJECT  INSULIN 100 each 0   LANTUS SOLOSTAR 100 UNIT/ML Solostar Pen INJECT 26 UNITS SUBCUTANEOUSLY ONCE DAILY 15 mL 3   levothyroxine (SYNTHROID) 88 MCG tablet TAKE 1 TABLET BY MOUTH ONCE DAILY BEFORE BREAKFAST 90 tablet 0   lisinopril (ZESTRIL) 20 MG tablet Take 1 tablet by mouth once daily 90 tablet 0   metFORMIN (GLUCOPHAGE-XR) 750 MG 24 hr tablet Take 1 tablet by mouth twice daily 180 tablet 0   methylphenidate (RITALIN) 20 MG tablet Take 0.5 tablets (10 mg total) by mouth 3 (three) times daily with meals. 135 tablet 0   OneTouch Delica Lancets 30G MISC USE 1  TO CHECK GLUCOSE ONCE DAILY 100 each 0   polyethylene glycol (MIRALAX / GLYCOLAX) packet Take 17 g by mouth daily as needed. 14 each 0   Probiotic Product (ALIGN) 4 MG CAPS Take 1 capsule by mouth daily.     rosuvastatin (CRESTOR) 20 MG tablet Take 1 tablet by mouth once daily 90 tablet 0   triamcinolone cream (KENALOG) 0.1 % Apply 1 Application topically 2 (two) times daily. To affected area /insect bite 15 g 0   No current facility-administered medications on file prior to visit.    Review of Systems  Constitutional:  Negative for activity change, appetite change, fatigue, fever and unexpected weight change.  HENT:  Negative for congestion, ear pain, rhinorrhea, sinus pressure and sore throat.   Eyes:  Negative for pain, redness and visual disturbance.  Respiratory:  Negative for cough, shortness of breath and wheezing.   Cardiovascular:  Negative for chest pain and palpitations.  Gastrointestinal:  Negative for abdominal pain, blood in stool, constipation and diarrhea.  Endocrine: Negative for polydipsia and polyuria.  Genitourinary:  Negative  for dysuria, frequency and urgency.  Musculoskeletal:  Positive for arthralgias. Negative for back pain and myalgias.  Skin:  Negative for pallor and rash.  Allergic/Immunologic: Negative for environmental allergies.  Neurological:  Negative for dizziness, syncope and headaches.   Hematological:  Negative for adenopathy. Does not bruise/bleed easily.  Psychiatric/Behavioral:  Negative for decreased concentration and dysphoric mood. The patient is not nervous/anxious.        Objective:   Physical Exam Constitutional:      General: She is not in acute distress.    Appearance: Normal appearance. She is well-developed and normal weight. She is not ill-appearing or diaphoretic.  HENT:     Head: Normocephalic and atraumatic.     Right Ear: Tympanic membrane, ear canal and external ear normal.     Left Ear: Tympanic membrane, ear canal and external ear normal.     Nose: Nose normal. No congestion.     Mouth/Throat:     Mouth: Mucous membranes are moist.     Pharynx: Oropharynx is clear. No posterior oropharyngeal erythema.  Eyes:     General: No scleral icterus.    Extraocular Movements: Extraocular movements intact.     Conjunctiva/sclera: Conjunctivae normal.     Pupils: Pupils are equal, round, and reactive to light.  Neck:     Thyroid: No thyromegaly.     Vascular: No carotid bruit or JVD.  Cardiovascular:     Rate and Rhythm: Normal rate and regular rhythm.     Pulses: Normal pulses.     Heart sounds: Normal heart sounds.     No gallop.  Pulmonary:     Effort: Pulmonary effort is normal. No respiratory distress.     Breath sounds: Normal breath sounds. No wheezing.     Comments: Good air exch Chest:     Chest wall: No tenderness.  Abdominal:     General: Bowel sounds are normal. There is no distension or abdominal bruit.     Palpations: Abdomen is soft. There is no mass.     Tenderness: There is no abdominal tenderness.     Hernia: No hernia is present.  Genitourinary:    Comments: Breast exam: No mass, nodules, thickening, tenderness, bulging, retraction, inflamation, nipple discharge or skin changes noted.  No axillary or clavicular LA.     Musculoskeletal:        General: No tenderness. Normal range of motion.     Cervical back: Normal range of  motion and neck supple. No rigidity. No muscular tenderness.     Right lower leg: No edema.     Left lower leg: No edema.     Comments: No kyphosis   Lymphadenopathy:     Cervical: No cervical adenopathy.  Skin:    General: Skin is warm and dry.     Coloration: Skin is not pale.     Findings: No erythema or rash.     Comments: Solar lentigines diffusely   Neurological:     Mental Status: She is alert. Mental status is at baseline.     Cranial Nerves: No cranial nerve deficit.     Motor: No abnormal muscle tone.     Coordination: Coordination normal.     Gait: Gait normal.     Deep Tendon Reflexes: Reflexes are normal and symmetric.  Psychiatric:        Mood and Affect: Mood normal.        Cognition and Memory: Cognition and memory normal.  Assessment & Plan:   Problem List Items Addressed This Visit       Cardiovascular and Mediastinum   Essential hypertension    bp in fair control at this time  BP Readings from Last 1 Encounters:  03/05/23 132/70   No changes needed Most recent labs reviewed  Disc lifstyle change with low sodium diet and exercise  Plan to continue lisinopril 20 mg daily  hctz 12.5 mg daily  Labs ordered      Relevant Orders   CBC with Differential/Platelet   Comprehensive metabolic panel   Lipid panel   TSH     Endocrine   Hyperlipidemia associated with type 2 diabetes mellitus (HCC)    Disc goals for lipids and reasons to control them Rev last labs with pt Rev low sat fat diet in detail   Currently taking Crestor 20 mg daily  Tricor 145 mg daily   Labs ordered       Relevant Orders   Comprehensive metabolic panel   Lipid panel   Hypothyroidism    Hypothyroidism  Pt has no clinical changes No change in energy level/ hair or skin/ edema and no tremor Lab Results  Component Value Date   TSH 2.33 08/28/2022     Taking 88 mcg levothyroxine Under care of endocrinology        Relevant Orders   TSH     Other    Encounter for screening mammogram for breast cancer    Mammogram ordered Pt promises to call and schedule it this year       Relevant Orders   MM 3D SCREENING MAMMOGRAM BILATERAL BREAST   Routine general medical examination at a health care facility - Primary    Reviewed health habits including diet and exercise and skin cancer prevention Reviewed appropriate screening tests for age  Also reviewed health mt list, fam hx and immunization status , as well as social and family history   See HPI Labs reviewed and ordered Plans to get shingrix and Td at pharmacy  Mammogram ordered-pt plans to schedule  Eye exam utd  Flu/covid shots utd  Colonoscopy utd 2019   no recall due to age  Discussed fall prevention, supplements and exercise for bone density   Declines another dexa (last was 2015)   no falls or fracture PHQ 0

## 2023-03-05 NOTE — Assessment & Plan Note (Signed)
Disc goals for lipids and reasons to control them Rev last labs with pt Rev low sat fat diet in detail   Currently taking Crestor 20 mg daily  Tricor 145 mg daily   Labs ordered

## 2023-03-05 NOTE — Assessment & Plan Note (Signed)
Sees endocrinology for care  On insulin Last A1c was 7.7   Her doctor retired and est with new provider soon

## 2023-03-05 NOTE — Assessment & Plan Note (Signed)
Hypothyroidism  Pt has no clinical changes No change in energy level/ hair or skin/ edema and no tremor Lab Results  Component Value Date   TSH 2.33 08/28/2022     Taking 88 mcg levothyroxine Under care of endocrinology

## 2023-03-05 NOTE — Assessment & Plan Note (Signed)
Reviewed health habits including diet and exercise and skin cancer prevention Reviewed appropriate screening tests for age  Also reviewed health mt list, fam hx and immunization status , as well as social and family history   See HPI Labs reviewed and ordered Plans to get shingrix and Td at pharmacy  Mammogram ordered-pt plans to schedule  Eye exam utd  Flu/covid shots utd  Colonoscopy utd 2019   no recall due to age  Discussed fall prevention, supplements and exercise for bone density   Declines another dexa (last was 2015)   no falls or fracture PHQ 0

## 2023-03-17 DIAGNOSIS — G4733 Obstructive sleep apnea (adult) (pediatric): Secondary | ICD-10-CM | POA: Diagnosis not present

## 2023-03-19 ENCOUNTER — Other Ambulatory Visit: Payer: Self-pay | Admitting: Family Medicine

## 2023-03-30 ENCOUNTER — Other Ambulatory Visit: Payer: Self-pay

## 2023-03-30 MED ORDER — HUMALOG KWIKPEN 100 UNIT/ML ~~LOC~~ SOPN: PEN_INJECTOR | SUBCUTANEOUS | 1 refills | Status: DC

## 2023-04-07 DIAGNOSIS — Z794 Long term (current) use of insulin: Secondary | ICD-10-CM | POA: Diagnosis not present

## 2023-04-10 ENCOUNTER — Telehealth: Payer: Self-pay | Admitting: Endocrinology

## 2023-04-10 ENCOUNTER — Other Ambulatory Visit: Payer: Self-pay

## 2023-04-10 DIAGNOSIS — E1165 Type 2 diabetes mellitus with hyperglycemia: Secondary | ICD-10-CM

## 2023-04-10 NOTE — Telephone Encounter (Signed)
MEDICATION:  HumaLOG KwikPen HUMALOG KWIKPEN 100 UNIT/ML KwikPen  PHARMACY:    HAS THE PATIENT CONTACTED THEIR PHARMACY?    [id number] - [place], [place] - [address] (Ph: [phone])    IS THIS A 90 DAY SUPPLY : Yes  IS PATIENT OUT OF MEDICATION: Yes  IF NOT; HOW MUCH IS LEFT:   LAST APPOINTMENT DATE: @06 /24/2024  NEXT APPOINTMENT DATE:@11 /10/2022  DO WE HAVE YOUR PERMISSION TO LEAVE A DETAILED MESSAGE?: Yes  OTHER COMMENTS: Patient states that Dr. Lucianne Muss changed her dosage at last visit, so needs new prescription to reflect the change.   **Let patient know to contact pharmacy at the end of the day to make sure medication is ready. **  ** Please notify patient to allow 48-72 hours to process**  **Encourage patient to contact the pharmacy for refills or they can request refills through [facility]**

## 2023-04-13 ENCOUNTER — Other Ambulatory Visit: Payer: Self-pay

## 2023-04-13 DIAGNOSIS — E063 Autoimmune thyroiditis: Secondary | ICD-10-CM

## 2023-04-13 MED ORDER — LEVOTHYROXINE SODIUM 88 MCG PO TABS: 88.0000 ug | ORAL_TABLET | Freq: Every day | ORAL | 0 refills | Status: DC

## 2023-04-14 ENCOUNTER — Ambulatory Visit: Payer: Medicare Other | Admitting: Endocrinology

## 2023-04-14 ENCOUNTER — Encounter: Payer: Self-pay | Admitting: Endocrinology

## 2023-04-14 VITALS — BP 134/70 | HR 56 | Resp 20 | Ht 62.0 in | Wt 165.2 lb

## 2023-04-14 DIAGNOSIS — E063 Autoimmune thyroiditis: Secondary | ICD-10-CM | POA: Diagnosis not present

## 2023-04-14 DIAGNOSIS — E1165 Type 2 diabetes mellitus with hyperglycemia: Secondary | ICD-10-CM

## 2023-04-14 DIAGNOSIS — Z794 Long term (current) use of insulin: Secondary | ICD-10-CM

## 2023-04-14 LAB — POCT GLYCOSYLATED HEMOGLOBIN (HGB A1C): Hemoglobin A1C: 8.2 % — AB (ref 4.0–5.6)

## 2023-04-14 MED ORDER — HUMALOG KWIKPEN 100 UNIT/ML ~~LOC~~ SOPN
PEN_INJECTOR | SUBCUTANEOUS | 3 refills | Status: AC
Start: 2023-04-14 — End: ?

## 2023-04-14 MED ORDER — LANTUS SOLOSTAR 100 UNIT/ML ~~LOC~~ SOPN: PEN_INJECTOR | SUBCUTANEOUS | 3 refills | Status: DC

## 2023-04-14 NOTE — Progress Notes (Signed)
Outpatient Endocrinology Note [place] Velera Lans[place], MD  04/14/23  Patient's Name: Amanda King    DOB: May 02, 1941    MRN: [id number]                                                    REASON OF VISIT: Follow up of type 2 diabetes mellitus  PCP: Tower, Audrie Gallus, MD  HISTORY OF PRESENT ILLNESS:   Amanda King is a 82 y.o. old female with past medical history listed below, is here for follow up for type 2 diabetes mellitus.  Patient was last seen by Dr. Lucianne Muss in June 2024.  Pertinent Diabetes History: Patient was diagnosed with type 2 diabetes mellitus in 2008.  Patient has uncontrolled type 2 diabetes mellitus.  Chronic Diabetes Complications : Retinopathy: no. Last ophthalmology exam was done on annually, reportedly, following with ophthalmology regularly.  Nephropathy: CKD IIa, on ACE/ARB /lisinopril. Peripheral neuropathy: no Coronary artery disease: no Stroke: no  Relevant comorbidities and cardiovascular risk factors: Obesity: yes Body mass index is 30.22 kg/m.  Hypertension: Yes  Hyperlipidemia : Yes, on statin   Current / Home Diabetic regimen includes: Lantus 26 units in the evening. Metformin extended release 750 mg 2 times a day. Jardiance 25 mg daily.  Prior note mentioned about taking half tablet.  Not clear about the dosing at this time.  Prior diabetic medications: She had taken glipizide, Januvia in the past.  Diarrhea with higher dose of metformin in the past.  Glycemic data:   She has One Touch Verio flex glucometer.  She has been checking blood sugar occasionally in the morning.  In last 2 weeks from October 22 to November 5 average blood sugar 165, highest 210, lowest 122.  She has been taking 0.6 times a day in average.  Some of the blood sugars 162, 178, 181, 210.  Mostly hyperglycemia.  Hypoglycemia: Patient has no hypoglycemic episodes. Patient has hypoglycemia awareness.  Factors modifying glucose control: 1.  Diabetic diet assessment: 3 meals a  day.  2.  Staying active or exercising: No formal exercise.  She will be active during daytime with grandkids.  3.  Medication compliance: compliant all of the time.  # Hypothyroidism -Currently taking levothyroxine 88 mcg daily since November 2022.  Recent thyroid lab normal.  Interval history  Diabetes regimen as reviewed above.  Glucometer data as reviewed above.  Recent lab from September with a stable renal function.  Acceptable lipid levels.  Normal TSH of 3.51.  Hemoglobin A1c today 8.2% mildly worsening.  She reports she has been using Humalog 4 units with breakfast and 6 unit with supper and she has been running out sooner, has not taken Humalog for last 8 days.  He denies hypoglycemic symptoms.  No other complaints today.  REVIEW OF SYSTEMS As per history of present illness.   PAST MEDICAL HISTORY: Past Medical History:  Diagnosis Date   History of ischemic colitis 05/2018   Hyperlipidemia, mixed    Hypersomnia, persistent    Hypertension    Hypothyroidism    endocrinologist--- dr Lucianne Muss   Migraine    Narcolepsy with cataplexy    followed by neurologist--- dr dohmeier   Subcutaneous mass    left shoulder   Type 2 diabetes mellitus treated with insulin [facility])    endocrinologist--- dr a. Lucianne Muss   (12-02-2021  per pt check blood sugar daily 2 hours after evening meal)   Wears glasses    Wears hearing aid in both ears     PAST SURGICAL HISTORY: Past Surgical History:  Procedure Laterality Date   APPENDECTOMY  1948   BIOPSY  05/24/2018   Procedure: BIOPSY;  Surgeon: Lemar Lofty., MD;  Location: [facility] ENDOSCOPY;  Service: Gastroenterology;;   COLONOSCOPY N/A 05/24/2018   Procedure: COLONOSCOPY;  Surgeon: Lemar Lofty., MD;  Location: [facility] ENDOSCOPY;  Service: Gastroenterology;  Laterality: N/A;   MASS EXCISION Left 12/06/2021   Procedure: LEFT SHOULDER SUBCUTANEOUS MASS EXCISION;  Surgeon: Quentin Ore, MD;  Location: [facility];   Service: General;  Laterality: Left;   POSTERIOR CERVICAL FUSION/FORAMINOTOMY  02/05/1975   C5-C6-C7   VAGINAL HYSTERECTOMY  1971    ALLERGIES: Allergies  Allergen Reactions   Penicillins Nausea And Vomiting and Rash    Has patient had a PCN reaction causing immediate rash, facial/tongue/throat swelling, SOB or lightheadedness with hypotension: Unknown Has patient had a PCN reaction causing severe rash involving mucus membranes or skin necrosis: Unknown Has patient had a PCN reaction that required hospitalization: Unknown Has patient had a PCN reaction occurring within the last 10 years: No If all of the above answers are "NO", then may proceed with Cephalosporin use.     FAMILY HISTORY:  Family History  Problem Relation Age of Onset   Heart disease Mother        MI   Peripheral vascular disease Mother    Hyperlipidemia Mother    Depression Brother        suicide   Diabetes Brother    Diabetes Brother    Sleep apnea Brother    Sleep apnea Son    Diabetes Son    Heart attack Son    Sudden Cardiac Death Son     SOCIAL HISTORY: Social History   Socioeconomic History   Marital status: Widowed    Spouse name: Not on file   Number of children: 3   Years of education: GED   Highest education level: Not on file  Occupational History   Occupation: works 1 day a week  Tobacco Use   Smoking status: Former    Current packs/day: 0.00    Types: Cigarettes    Start date: 70    Quit date: 1982    Years since quitting: 42.8   Smokeless tobacco: Never  Vaping Use   Vaping status: Never Used  Substance and Sexual Activity   Alcohol use: No    Alcohol/week: 0.0 standard drinks of alcohol   Drug use: Never   Sexual activity: Not on file  Other Topics Concern   Not on file  Social History Narrative   Patient is widowed.  Walks for exercise.  Patient has three children.Patient is right-handed.Patient drinks three cups of caffeine daily.  Patient is retired.Patient has a  GED.   Social Determinants of Health   Financial Resource Strain: Low Risk  (09/25/2022)   Overall Financial Resource Strain (CARDIA)    Difficulty of Paying Living Expenses: Not hard at all  Food Insecurity: No Food Insecurity (09/25/2022)   Hunger Vital Sign    Worried About Running Out of Food in the Last Year: Never true    Ran Out of Food in the Last Year: Never true  Transportation Needs: No Transportation Needs (09/25/2022)   PRAPARE - Transportation    Lack of Transportation (Medical): No    Lack of  Transportation (Non-Medical): No  Physical Activity: Inactive (09/25/2022)   Exercise Vital Sign    Days of Exercise per Week: 0 days    Minutes of Exercise per Session: 0 min  Stress: No Stress Concern Present (09/25/2022)   [employer] of Occupational Health - Occupational Stress Questionnaire    Feeling of Stress : Not at all  Social Connections: Moderately Isolated (09/25/2022)   Social Connection and Isolation Panel [NHANES]    Frequency of Communication with Friends and Family: More than three times a week    Frequency of Social Gatherings with Friends and Family: More than three times a week    Attends Religious Services: More than 4 times per year    Active Member of [employer] or Organizations: No    Attends Banker Meetings: Never    Marital Status: Widowed    MEDICATIONS:  Current Outpatient Medications  Medication Sig Dispense Refill   cetirizine (ZYRTEC) 10 MG tablet Take 10 mg by mouth daily as needed for allergies or rhinitis.      empagliflozin (JARDIANCE) 25 MG TABS tablet TAKE 1 TABLET BY MOUTH ONCE DAILY BEFORE BREAKFAST 30 tablet 3   fenofibrate (TRICOR) 145 MG tablet Take 1 tablet by mouth once daily 90 tablet 1   glucose blood (ONETOUCH VERIO) test strip USE 1 STRIP TO CHECK GLUCOSE ONCE DAILY 100 each 2   hydrochlorothiazide (HYDRODIURIL) 25 MG tablet Take 1/2 (one-half) tablet by mouth once daily 45 tablet 2   imipramine (TOFRANIL) 50 MG  tablet Take 1 tablet (50 mg total) by mouth at bedtime. 90 tablet 3   Insulin Pen Needle (B-D UF III MINI PEN NEEDLES) 31G X 5 MM MISC USE 1 PEN NEEDLE  THREE TIMES DAILY TO INJECT INSULIN 100 each 2   Insulin Pen Needle (B-D UF III MINI PEN NEEDLES) 31G X 5 MM MISC USE 1  THREE TIMES DAILY TO  INJECT  INSULIN 100 each 0   levothyroxine (SYNTHROID) 88 MCG tablet Take 1 tablet (88 mcg total) by mouth daily before breakfast. 90 tablet 0   lisinopril (ZESTRIL) 20 MG tablet Take 1 tablet by mouth once daily 90 tablet 2   metFORMIN (GLUCOPHAGE-XR) 750 MG 24 hr tablet Take 1 tablet by mouth twice daily 180 tablet 0   methylphenidate (RITALIN) 20 MG tablet Take 0.5 tablets (10 mg total) by mouth 3 (three) times daily with meals. 135 tablet 0   OneTouch Delica Lancets 30G MISC USE 1  TO CHECK GLUCOSE ONCE DAILY 100 each 0   polyethylene glycol (MIRALAX / GLYCOLAX) packet Take 17 g by mouth daily as needed. 14 each 0   Probiotic Product (ALIGN) 4 MG CAPS Take 1 capsule by mouth daily.     rosuvastatin (CRESTOR) 20 MG tablet Take 1 tablet by mouth once daily 90 tablet 0   triamcinolone cream (KENALOG) 0.1 % Apply 1 Application topically 2 (two) times daily. To affected area /insect bite 15 g 0   HUMALOG KWIKPEN 100 UNIT/ML KwikPen INJECT 4 UNITS SUBCUTANEOUSLY WITH BREAKFAST AND 6 UNITS WITH SUPPER BEFORE MEALS 15 mL 3   LANTUS SOLOSTAR 100 UNIT/ML Solostar Pen INJECT 28 UNITS SUBCUTANEOUSLY ONCE DAILY. 15 mL 3   No current facility-administered medications for this visit.    PHYSICAL EXAM: Vitals:   04/14/23 1404  BP: 134/70  Pulse: (!) 56  Resp: 20  SpO2: 97%  Weight: 165 lb 3.2 oz (74.9 kg)  Height: 5\' 2"  (1.575 m)   Body mass index  is 30.22 kg/m.  Wt Readings from Last 3 Encounters:  04/14/23 165 lb 3.2 oz (74.9 kg)  03/05/23 157 lb 14.4 oz (71.6 kg)  01/05/23 165 lb (74.8 kg)    General: Well developed, well nourished female in no apparent distress.  HEENT: AT/[place], no external lesions.   Eyes: Conjunctiva clear and no icterus. Neck: Neck supple  Lungs: Respirations not labored Neurologic: Alert, oriented, normal speech Extremities / Skin: Dry.  Psychiatric: Does not appear depressed or anxious  Diabetic Foot Exam - Simple   No data filed    LABS Reviewed Lab Results  Component Value Date   HGBA1C 8.2 (A) 04/14/2023   HGBA1C 7.7 (A) 12/01/2022   HGBA1C 8.2 (H) 08/28/2022   Lab Results  Component Value Date   FRUCTOSAMINE 309 (H) 06/18/2021   FRUCTOSAMINE 267 11/22/2018   FRUCTOSAMINE 349 (H) 08/18/2018   Lab Results  Component Value Date   CHOL 158 03/05/2023   HDL 56.20 03/05/2023   LDLCALC 68 03/05/2023   LDLDIRECT 110.0 02/12/2022   TRIG 173.0 (H) 03/05/2023   CHOLHDL 3 03/05/2023   Lab Results  Component Value Date   MICRALBCREAT 1.6 11/12/2021   MICRALBCREAT 1.0 10/18/2020   Lab Results  Component Value Date   CREATININE 1.09 03/05/2023   Lab Results  Component Value Date   GFR 47.04 (L) 03/05/2023    ASSESSMENT / PLAN  1. Uncontrolled type 2 diabetes mellitus with hyperglycemia, with long-term current use of insulin (HCC)   2. Acquired autoimmune hypothyroidism     Diabetes Mellitus type 2, complicated by CKD. - Diabetic status / severity: Uncontrolled.  Lab Results  Component Value Date   HGBA1C 8.2 (A) 04/14/2023    - Hemoglobin A1c goal : <7.5%  - Medications: See below.  I) Increase lantus from 26 to 28 units daily. II) continue Humalog 4 units with breakfast and 6 units supper. III) continue metformin 750mg  two times a day. IV) continue Jardiance 25 mg daily.  - Home glucose testing: With meals and bedtime 3-4 times a day.  Advised to check blood sugar more often.  - Discussed/ Gave Hypoglycemia treatment plan.  # Consult : not required at this time.   # Annual urine for microalbuminuria/ creatinine ratio, no microalbuminuria currently, continue ACE/ARB /lisinopril.  Will check lab in next follow-up visit. Last   Lab Results  Component Value Date   MICRALBCREAT 1.6 11/12/2021    # Foot check nightly / neuropathy.  # Annual dilated diabetic eye exams.   - Diet: Make healthy diabetic food choices - Life style / activity / exercise: Discussed.  2. Blood pressure  -  BP Readings from Last 1 Encounters:  04/14/23 134/70    - Control is in target.  - No change in current plans.  3. Lipid status / Hyperlipidemia - Last  Lab Results  Component Value Date   LDLCALC 68 03/05/2023   - Continue rosuvastatin 20 mg daily.  # Hypothyroidism -Continue levothyroxine 88 mcg daily.  Diagnoses and all orders for this visit:  Uncontrolled type 2 diabetes mellitus with hyperglycemia, with long-term current use of insulin (HCC) -     POCT glycosylated hemoglobin (Hb A1C) -     Cancel: Microalbumin / creatinine urine ratio; Future -     LANTUS SOLOSTAR 100 UNIT/ML Solostar Pen; INJECT 28 UNITS SUBCUTANEOUSLY ONCE DAILY. -     Microalbumin / creatinine urine ratio; Future -     Hemoglobin A1c; Future -  Basic metabolic panel; Future  Acquired autoimmune hypothyroidism  Other orders -     HUMALOG KWIKPEN 100 UNIT/ML KwikPen; INJECT 4 UNITS SUBCUTANEOUSLY WITH BREAKFAST AND 6 UNITS WITH SUPPER BEFORE MEALS    DISPOSITION Follow up in clinic in 3 months suggested.   All questions answered and patient verbalized understanding of the plan.  Iraq Sanaai Doane, MD [facility] Endocrinology [facility] [address] [place], [address] [place], [place] [address] Phone # [phone]  At least part of this note was generated using voice recognition software. Inadvertent word errors may have occurred, which were not recognized during the proofreading process.

## 2023-04-14 NOTE — Patient Instructions (Addendum)
Diabetes regimen: Increase lantus to 28 units daily. Humalog 4 units with breakfast and 6 units supper. Metformin 750mg  two times a day. Continue Jardiance.

## 2023-04-16 ENCOUNTER — Other Ambulatory Visit: Payer: Self-pay

## 2023-04-24 ENCOUNTER — Other Ambulatory Visit: Payer: Self-pay

## 2023-04-24 ENCOUNTER — Telehealth: Payer: Self-pay | Admitting: Endocrinology

## 2023-04-24 DIAGNOSIS — E1165 Type 2 diabetes mellitus with hyperglycemia: Secondary | ICD-10-CM

## 2023-04-24 MED ORDER — METFORMIN HCL ER 750 MG PO TB24: 750.0000 mg | ORAL_TABLET | Freq: Two times a day (BID) | ORAL | 0 refills | Status: DC

## 2023-04-24 NOTE — Telephone Encounter (Signed)
Patient advising that she need a refill on her Metformin sent to her pharmacy. She is out and asked if someone could let her know when it is done.

## 2023-04-28 ENCOUNTER — Other Ambulatory Visit: Payer: Self-pay

## 2023-04-28 DIAGNOSIS — E063 Autoimmune thyroiditis: Secondary | ICD-10-CM

## 2023-04-28 MED ORDER — LEVOTHYROXINE SODIUM 88 MCG PO TABS: 88.0000 ug | ORAL_TABLET | Freq: Every day | ORAL | 0 refills | Status: DC

## 2023-04-30 ENCOUNTER — Telehealth: Payer: Self-pay | Admitting: Endocrinology

## 2023-04-30 ENCOUNTER — Other Ambulatory Visit: Payer: Self-pay

## 2023-04-30 ENCOUNTER — Telehealth: Payer: Self-pay

## 2023-04-30 DIAGNOSIS — E1165 Type 2 diabetes mellitus with hyperglycemia: Secondary | ICD-10-CM

## 2023-04-30 MED ORDER — BD PEN NEEDLE MINI U/F 31G X 5 MM MISC: 2 refills | Status: DC

## 2023-04-30 NOTE — Telephone Encounter (Signed)
Pen needle refill request complete.

## 2023-04-30 NOTE — Telephone Encounter (Signed)
MEDICATION: B-D UF III MINI PEN NEEDLES Insulin Pen Needle (B-D UF III MINI PEN NEEDLES) 31G X 5 MM MISC  PHARMACY:    [id number] - [place] ([place]), [place] - [address] (Ph: [phone])    HAS THE PATIENT CONTACTED THEIR PHARMACY?  Yes  IS THIS A 90 DAY SUPPLY : Yes  IS PATIENT OUT OF MEDICATION: Yes  IF NOT; HOW MUCH IS LEFT:   LAST APPOINTMENT DATE: @11 /10/2022  NEXT APPOINTMENT DATE:@2 /10/2023  DO WE HAVE YOUR PERMISSION TO LEAVE A DETAILED MESSAGE?:Yes  OTHER COMMENTS: Patient states that she was unable to take her shot this morning due to being out of needles.   **Let patient know to contact pharmacy at the end of the day to make sure medication is ready. **  ** Please notify patient to allow 48-72 hours to process**  **Encourage patient to contact the pharmacy for refills or they can request refills through [facility]**

## 2023-05-01 ENCOUNTER — Other Ambulatory Visit: Payer: Self-pay

## 2023-05-06 ENCOUNTER — Other Ambulatory Visit: Payer: Self-pay | Admitting: Family Medicine

## 2023-06-09 ENCOUNTER — Ambulatory Visit (INDEPENDENT_AMBULATORY_CARE_PROVIDER_SITE_OTHER): Payer: Medicare Other | Admitting: Family Medicine

## 2023-06-09 ENCOUNTER — Encounter: Payer: Self-pay | Admitting: Family Medicine

## 2023-06-09 VITALS — BP 128/78 | HR 98 | Temp 98.1°F | Ht 62.0 in | Wt 168.2 lb

## 2023-06-09 DIAGNOSIS — J069 Acute upper respiratory infection, unspecified: Secondary | ICD-10-CM | POA: Diagnosis not present

## 2023-06-09 DIAGNOSIS — J0101 Acute recurrent maxillary sinusitis: Secondary | ICD-10-CM

## 2023-06-09 MED ORDER — PREDNISONE 20 MG PO TABS: 20.0000 mg | ORAL_TABLET | Freq: Every day | ORAL | 0 refills | Status: DC

## 2023-06-09 MED ORDER — DOXYCYCLINE HYCLATE 100 MG PO TABS: 100.0000 mg | ORAL_TABLET | Freq: Two times a day (BID) | ORAL | 0 refills | Status: DC

## 2023-06-09 MED ORDER — PROMETHAZINE-DM 6.25-15 MG/5ML PO SYRP: 5.0000 mL | ORAL_SOLUTION | Freq: Every evening | ORAL | 0 refills | Status: DC | PRN

## 2023-06-09 NOTE — Assessment & Plan Note (Signed)
 This has lead to early bacterial sinusitis (see a/p)  Overall reassuring exam  Prednisone  20 mg for 5 d px Prometh  dm for cough with caution of sedation   AVS-see for symptom care  Update if not starting to improve in a week or if worsening  Call back and Er precautions noted in detail today

## 2023-06-09 NOTE — Assessment & Plan Note (Signed)
 Over a week into uri with congestion/ facial pain and cough  Also mild wheezing  Prescription for prednisone  20 mg and doxycycline  given  Also prometh  dm for cough   See AVS for symptom care  Saline sinus irrigation may help Update if not starting to improve in a week or if worsening  Call back and Er precautions noted in detail today

## 2023-06-09 NOTE — Patient Instructions (Signed)
 Drink fluids and rest  mucinex  DM is good for cough and congestion during the day Try prometh  DM for cough at night Take prednisone  as directed for congestion and wheezing  Nasal saline for congestion as needed (sinus rinse is helpful)  Tylenol  for fever or pain or headache  Please alert us  if symptoms worsen (if severe or short of breath please go to the ER) ;to  Update if not starting to improve in a week or if worsening

## 2023-06-09 NOTE — Progress Notes (Signed)
 Subjective:    Patient ID: Amanda King, female    DOB: 11/11/1940, 82 y.o.   MRN: [id number]  HPI  Wt Readings from Last 3 Encounters:  06/09/23 168 lb 4 oz (76.3 kg)  04/14/23 165 lb 3.2 oz (74.9 kg)  03/05/23 157 lb 14.4 oz (71.6 kg)   30.77 kg/m  Vitals:   06/09/23 1121  BP: 128/78  Pulse: 98  Temp: 98.1 F (36.7 C)  SpO2: 98%    Pt presents with cough/ nasal congestion and sinus symptoms for over a week   Feels like she is choking from mucous in throat  Nasal congestion -nose is sore from blowing  Difficult to use her cpap-makes it hard to breathe   Wheezes a bit at night Not shortness of breath   Cough -sometimes productive - usually clear   No fever  Throat is scratchy feeling /not bad  Ears are ok / a little more wax   No n/v/d   Over the counter  Mucinex  dm  Zyrtec Cough syrup - robitussin  Flonase ns   Drinking fluids    Has not used saline   Some facial pain /pressure     Patient Active Problem List   Diagnosis Date Noted   URI with cough and congestion 06/09/2023   OSA on CPAP 01/05/2023   At risk for obstructive sleep apnea 08/05/2022   Allergic rhinitis 04/21/2022   Encounter for screening mammogram for breast cancer 01/03/2022   Grief reaction Mar 05, 2021   Family history of sudden cardiac death 03-05-21   Localized swelling, mass and lump, neck 03/05/2021   History of ischemic colitis    Constipation    Routine general medical examination at a health care facility 01/31/2016   Narcolepsy with cataplexy 10/31/2014   Acute sinusitis 08/15/2014   Encounter for Medicare annual wellness exam 09/22/2013   Hypersomnia, persistent 07/15/2013   Colon cancer screening 12/31/2011   ANXIETY 05/06/2010   DERMATOPHYTOSIS OF NAIL 12/21/2008   Hypothyroidism 09/02/2006   Diabetes type 2, uncontrolled 09/02/2006   Hyperlipidemia associated with type 2 diabetes mellitus (HCC) 09/02/2006   NARCOLEPSY W/CATAPLEXY 09/02/2006   Essential  hypertension 09/02/2006   PNEUMONIA, HX OF 09/02/2006   Past Medical History:  Diagnosis Date   History of ischemic colitis 05/2018   Hyperlipidemia, mixed    Hypersomnia, persistent    Hypertension    Hypothyroidism    endocrinologist--- dr von   Migraine    Narcolepsy with cataplexy    followed by neurologist--- dr dohmeier   Subcutaneous mass    left shoulder   Type 2 diabetes mellitus treated with insulin  [facility])    endocrinologist--- dr a. von   (12-02-2021  per pt check blood sugar daily 2 hours after evening meal)   Wears glasses    Wears hearing aid in both ears    Past Surgical History:  Procedure Laterality Date   APPENDECTOMY  1948   BIOPSY  05/24/2018   Procedure: BIOPSY;  Surgeon: Wilhelmenia Aloha Raddle., MD;  Location: [facility] END[facility]COPY;  Service: Gastroenterology;;   COLONOSCOPY N/A 05/24/2018   Procedure: COLONOSCOPY;  Surgeon: Wilhelmenia Aloha Raddle., MD;  Location: [facility] ENDOSCOPY;  Service: Gastroenterology;  Laterality: N/A;   MASS EXCISION Left 12/06/2021   Procedure: LEFT SHOULDER SUBCUTANEOUS MASS EXCISION;  Surgeon: Lyndel Deward PARAS, MD;  Location: [facility];  Service: General;  Laterality: Left;   POSTERIOR CERVICAL FUSION/FORAMINOTOMY  March 06, 1975   C5-C6-C7   VAGINAL HYSTERECTOMY  1971  Social History   Tobacco Use   Smoking status: Former    Current packs/day: 0.00    Types: Cigarettes    Start date: 34    Quit date: 1982    Years since quitting: 43.0   Smokeless tobacco: Never  Vaping Use   Vaping status: Never Used  Substance Use Topics   Alcohol use: No    Alcohol/week: 0.0 standard drinks of alcohol   Drug use: Never   Family History  Problem Relation Age of Onset   Heart disease Mother        MI   Peripheral vascular disease Mother    Hyperlipidemia Mother    Depression Brother        suicide   Diabetes Brother    Diabetes Brother    Sleep apnea Brother    Sleep apnea Son    Diabetes Son    Heart attack  Son    Sudden Cardiac Death Son    Allergies  Allergen Reactions   Penicillins Nausea And Vomiting and Rash    Has patient had a PCN reaction causing immediate rash, facial/tongue/throat swelling, SOB or lightheadedness with hypotension: Unknown Has patient had a PCN reaction causing severe rash involving mucus membranes or skin necrosis: Unknown Has patient had a PCN reaction that required hospitalization: Unknown Has patient had a PCN reaction occurring within the last 10 years: No If all of the above answers are NO, then may proceed with Cephalosporin use.    Current Outpatient Medications on File Prior to Visit  Medication Sig Dispense Refill   cetirizine (ZYRTEC) 10 MG tablet Take 10 mg by mouth daily as needed for allergies or rhinitis.      empagliflozin  (JARDIANCE ) 25 MG TABS tablet TAKE 1 TABLET BY MOUTH ONCE DAILY BEFORE BREAKFAST 30 tablet 3   fenofibrate  (TRICOR ) 145 MG tablet Take 1 tablet by mouth once daily 90 tablet 1   glucose blood (ONETOUCH VERIO) test strip USE 1 STRIP TO CHECK GLUCOSE ONCE DAILY 100 each 2   HUMALOG  KWIKPEN 100 UNIT/ML KwikPen INJECT 4 UNITS SUBCUTANEOUSLY WITH BREAKFAST AND 6 UNITS WITH SUPPER BEFORE MEALS 15 mL 3   hydrochlorothiazide  (HYDRODIURIL ) 25 MG tablet Take 1/2 (one-half) tablet by mouth once daily 45 tablet 2   imipramine  (TOFRANIL ) 50 MG tablet Take 1 tablet (50 mg total) by mouth at bedtime. 90 tablet 3   Insulin  Pen Needle (B-D UF III MINI PEN NEEDLES) 31G X 5 MM MISC USE 1  THREE TIMES DAILY TO  INJECT  INSULIN  100 each 0   Insulin  Pen Needle (B-D UF III MINI PEN NEEDLES) 31G X 5 MM MISC USE 1 PEN NEEDLE  THREE TIMES DAILY TO INJECT INSULIN  100 each 2   LANTUS  SOLOSTAR 100 UNIT/ML Solostar Pen INJECT 28 UNITS SUBCUTANEOUSLY ONCE DAILY. 15 mL 3   levothyroxine  (SYNTHROID ) 88 MCG tablet Take 1 tablet (88 mcg total) by mouth daily before breakfast. 90 tablet 0   lisinopril  (ZESTRIL ) 20 MG tablet Take 1 tablet by mouth once daily 90 tablet  2   metFORMIN  (GLUCOPHAGE -XR) 750 MG 24 hr tablet Take 1 tablet (750 mg total) by mouth 2 (two) times daily. 180 tablet 0   methylphenidate  (RITALIN ) 20 MG tablet Take 0.5 tablets (10 mg total) by mouth 3 (three) times daily with meals. 135 tablet 0   OneTouch Delica Lancets 30G MISC USE 1  TO CHECK GLUCOSE ONCE DAILY 100 each 0   polyethylene glycol (MIRALAX  / GLYCOLAX ) packet Take 17  g by mouth daily as needed. 14 each 0   Probiotic Product (ALIGN) 4 MG CAPS Take 1 capsule by mouth daily.     rosuvastatin  (CRESTOR ) 20 MG tablet Take 1 tablet by mouth once daily 90 tablet 2   triamcinolone  cream (KENALOG ) 0.1 % Apply 1 Application topically 2 (two) times daily. To affected area /insect bite 15 g 0   No current facility-administered medications on file prior to visit.    Review of Systems  Constitutional:  Positive for appetite change. Negative for fatigue and fever.  HENT:  Positive for congestion, ear pain, postnasal drip, rhinorrhea, sinus pressure and sore throat. Negative for nosebleeds.   Eyes:  Negative for pain, redness and itching.  Respiratory:  Positive for cough. Negative for shortness of breath and wheezing.   Cardiovascular:  Negative for chest pain.  Gastrointestinal:  Negative for abdominal pain, diarrhea, nausea and vomiting.  Endocrine: Negative for polyuria.  Genitourinary:  Negative for dysuria, frequency and urgency.  Musculoskeletal:  Negative for arthralgias and myalgias.  Allergic/Immunologic: Negative for immunocompromised state.  Neurological:  Positive for headaches. Negative for dizziness, tremors, syncope, weakness and numbness.  Hematological:  Negative for adenopathy. Does not bruise/bleed easily.  Psychiatric/Behavioral:  Negative for dysphoric mood. The patient is not nervous/anxious.        Objective:   Physical Exam Constitutional:      General: She is not in acute distress.    Appearance: Normal appearance. She is well-developed. She is obese. She  is not ill-appearing.  HENT:     Head: Normocephalic and atraumatic.     Comments: Nares are injected and congested  Clear pnd   Bilateral maxillary sinus tenderness     Right Ear: Tympanic membrane and external ear normal.     Left Ear: Tympanic membrane, ear canal and external ear normal.     Nose: Congestion and rhinorrhea present.     Mouth/Throat:     Mouth: Mucous membranes are moist.     Pharynx: No oropharyngeal exudate or posterior oropharyngeal erythema.     Comments: Clear pnd Eyes:     General:        Right eye: No discharge.        Left eye: No discharge.     Conjunctiva/sclera: Conjunctivae normal.     Pupils: Pupils are equal, round, and reactive to light.  Cardiovascular:     Rate and Rhythm: Normal rate and regular rhythm.  Pulmonary:     Effort: Pulmonary effort is normal. No respiratory distress.     Breath sounds: Normal breath sounds. No stridor. No wheezing, rhonchi or rales.     Comments: Scant wheeze on forced expiration  Musculoskeletal:     Cervical back: Normal range of motion and neck supple.  Lymphadenopathy:     Cervical: No cervical adenopathy.  Skin:    General: Skin is warm and dry.     Findings: No rash.  Neurological:     Mental Status: She is alert.     Cranial Nerves: No cranial nerve deficit.  Psychiatric:        Mood and Affect: Mood normal.           Assessment & Plan:   Problem List Items Addressed This Visit       Respiratory   URI with cough and congestion   This has lead to early bacterial sinusitis (see a/p)  Overall reassuring exam  Prednisone  20 mg for 5 d px Prometh  dm for cough  with caution of sedation   AVS-see for symptom care  Update if not starting to improve in a week or if worsening  Call back and Er precautions noted in detail today        Acute sinusitis - Primary   Over a week into uri with congestion/ facial pain and cough  Also mild wheezing  Prescription for prednisone  20 mg and doxycycline   given  Also prometh  dm for cough   See AVS for symptom care  Saline sinus irrigation may help Update if not starting to improve in a week or if worsening  Call back and Er precautions noted in detail today        Relevant Medications   doxycycline  (VIBRA -TABS) 100 MG tablet   predniSONE  (DELTASONE ) 20 MG tablet   promethazine -dextromethorphan (PROMETHAZINE -DM) 6.25-15 MG/5ML syrup

## 2023-06-25 DIAGNOSIS — G4733 Obstructive sleep apnea (adult) (pediatric): Secondary | ICD-10-CM | POA: Diagnosis not present

## 2023-06-30 ENCOUNTER — Other Ambulatory Visit (INDEPENDENT_AMBULATORY_CARE_PROVIDER_SITE_OTHER): Payer: Medicare Other

## 2023-06-30 ENCOUNTER — Ambulatory Visit (INDEPENDENT_AMBULATORY_CARE_PROVIDER_SITE_OTHER): Payer: Medicare Other | Admitting: Physician Assistant

## 2023-06-30 ENCOUNTER — Encounter: Payer: Self-pay | Admitting: Physician Assistant

## 2023-06-30 DIAGNOSIS — M25562 Pain in left knee: Secondary | ICD-10-CM

## 2023-06-30 DIAGNOSIS — M1712 Unilateral primary osteoarthritis, left knee: Secondary | ICD-10-CM | POA: Diagnosis not present

## 2023-06-30 NOTE — Progress Notes (Signed)
Office Visit Note   Patient: Amanda King           Date of Birth: [birth year]           MRN: [id number] Visit Date: 06/30/2023              Requested by: Tower, Audrie Gallus, MD [address] [place],  [place] [address] PCP: Judy Pimple, MD   Assessment & Plan: Visit Diagnoses:  1. Left knee pain, unspecified chronicity   2. Arthritis of left knee     Plan: Ladestiny is a pleasant 83 year old woman with onset of left knee pain about 4 days ago.  Denies any injury or previous history.  She has put some topical anti-inflammatory as well as heat and admits her knee is feeling better today.  Denies any calf pain.  X-rays do show some varus arthritis with joint space narrowing most noted in the medial compartment.  Exam findings consistent with the same.  She has no calf pain negative Denna Haggard' sign she does have some fullness in the back of her knee could be a Baker's cyst.  Unfortunately cannot do an injection today as her A1c is over 8 and she admits she is very sensitive with her blood sugars.  Since she is getting better I recommended Tylenol arthritis and continuing to use topical anti-inflammatories.  Also talked her daughter about getting her knee compression sleeve.  Can follow-up if she is have problems.  Could consider viscosupplementation  Follow-Up Instructions: No follow-ups on file.   Orders:  Orders Placed This Encounter  Procedures   XR KNEE 3 VIEW LEFT   No orders of the defined types were placed in this encounter.     Procedures: No procedures performed   Clinical Data: No additional findings.   Subjective: Chief Complaint  Patient presents with   Left Knee - Pain    HPI patient is a pleasant 83 year old woman with a chief complaint of left knee pain x 4 days.  Denies any injuries but initially had pain that cause difficulty in standing.  Pain started on the inside of her knee but then moved to the back of her knee.  Denies any fever or chills  Review  of Systems  All other systems reviewed and are negative.    Objective: Vital Signs: There were no vitals taken for this visit.  Physical Exam Constitutional:      Appearance: Normal appearance.  Pulmonary:     Effort: Pulmonary effort is normal.  Skin:    General: Skin is warm and dry.  Neurological:     General: No focal deficit present.     Mental Status: She is alert and oriented to person, place, and time.  Psychiatric:        Mood and Affect: Mood normal.        Behavior: Behavior normal.     Ortho Exam Examination of her left knee no effusion no erythema compartments are soft and compressible no tenderness in her calf negative Homans' sign.  She does have some tenderness in the medial joint line good stability Specialty Comments:  No specialty comments available.  Imaging: XR KNEE 3 VIEW LEFT Result Date: 06/30/2023 X-rays of her left knee demonstrate some varus alignment with joint space narrowing and sclerotic changes more of the medial joint.    PMFS History: Patient Active Problem List   Diagnosis Date Noted   Arthritis of left knee 06/30/2023   URI with cough and  congestion 06/09/2023   OSA on CPAP 01/05/2023   At risk for obstructive sleep apnea 08/05/2022   Allergic rhinitis 04/21/2022   Encounter for screening mammogram for breast cancer 01/03/2022   Grief reaction 02/08/2021   Family history of sudden cardiac death 02/08/21   Localized swelling, mass and lump, neck 02/08/2021   History of ischemic colitis    Constipation    Routine general medical examination at a health care facility 01/31/2016   Narcolepsy with cataplexy 10/31/2014   Acute sinusitis 08/15/2014   Encounter for Medicare annual wellness exam 09/22/2013   Hypersomnia, persistent 07/15/2013   Colon cancer screening 12/31/2011   ANXIETY 05/06/2010   DERMATOPHYTOSIS OF NAIL 12/21/2008   Hypothyroidism 09/02/2006   Diabetes type 2, uncontrolled 09/02/2006   Hyperlipidemia  associated with type 2 diabetes mellitus (HCC) 09/02/2006   NARCOLEPSY W/CATAPLEXY 09/02/2006   Essential hypertension 09/02/2006   PNEUMONIA, HX OF 09/02/2006   Past Medical History:  Diagnosis Date   History of ischemic colitis 05/2018   Hyperlipidemia, mixed    Hypersomnia, persistent    Hypertension    Hypothyroidism    endocrinologist--- dr Lucianne Muss   Migraine    Narcolepsy with cataplexy    followed by neurologist--- dr dohmeier   Subcutaneous mass    left shoulder   Type 2 diabetes mellitus treated with insulin [facility])    endocrinologist--- dr a. Lucianne Muss   (12-02-2021  per pt check blood sugar daily 2 hours after evening meal)   Wears glasses    Wears hearing aid in both ears     Family History  Problem Relation Age of Onset   Heart disease Mother        MI   Peripheral vascular disease Mother    Hyperlipidemia Mother    Depression Brother        suicide   Diabetes Brother    Diabetes Brother    Sleep apnea Brother    Sleep apnea Son    Diabetes Son    Heart attack Son    Sudden Cardiac Death Son     Past Surgical History:  Procedure Laterality Date   APPENDECTOMY  1948   BIOPSY  05/24/2018   Procedure: BIOPSY;  Surgeon: Meridee Score, Netty Starring., MD;  Location: [facility] ENDOSCOPY;  Service: Gastroenterology;;   COLONOSCOPY N/A 05/24/2018   Procedure: COLONOSCOPY;  Surgeon: Lemar Lofty., MD;  Location: [facility] ENDOSCOPY;  Service: Gastroenterology;  Laterality: N/A;   MASS EXCISION Left 12/06/2021   Procedure: LEFT SHOULDER SUBCUTANEOUS MASS EXCISION;  Surgeon: Quentin Ore, MD;  Location: [facility];  Service: General;  Laterality: Left;   POSTERIOR CERVICAL FUSION/FORAMINOTOMY  09-Feb-1975   C5-C6-C7   VAGINAL HYSTERECTOMY  1971   Social History   Occupational History   Occupation: works 1 day a week  Tobacco Use   Smoking status: Former    Current packs/day: 0.00    Types: Cigarettes    Start date: 1972    Quit date: 1982    Years  since quitting: 43.0   Smokeless tobacco: Never  Vaping Use   Vaping status: Never Used  Substance and Sexual Activity   Alcohol use: No    Alcohol/week: 0.0 standard drinks of alcohol   Drug use: Never   Sexual activity: Not on file

## 2023-07-07 ENCOUNTER — Other Ambulatory Visit: Payer: Self-pay | Admitting: Neurology

## 2023-07-07 DIAGNOSIS — G4710 Hypersomnia, unspecified: Secondary | ICD-10-CM

## 2023-07-07 MED ORDER — METHYLPHENIDATE HCL 20 MG PO TABS: 10.0000 mg | ORAL_TABLET | Freq: Three times a day (TID) | ORAL | 0 refills | Status: DC

## 2023-07-07 NOTE — Telephone Encounter (Signed)
Pt does not have enough of her methylphenidate (RITALIN) 20 MG tablet to last until appointment, pt is asking if about 8 days worth of the medication can be called into [facility] [phone]

## 2023-07-07 NOTE — Telephone Encounter (Addendum)
Last seen on 01/05/23 Follow up scheduled on 07/13/23 Last filed on 01/05/23 #135 (90 tablets) per epic  Rx pending to be signed

## 2023-07-07 NOTE — Addendum Note (Signed)
Addended by: Melvyn Novas on: 07/07/2023 04:42 PM   Modules accepted: Orders

## 2023-07-07 NOTE — Addendum Note (Signed)
Addended by: Aura Camps on: 07/07/2023 09:25 AM   Modules accepted: Orders

## 2023-07-08 ENCOUNTER — Telehealth: Payer: Self-pay

## 2023-07-08 DIAGNOSIS — E1165 Type 2 diabetes mellitus with hyperglycemia: Secondary | ICD-10-CM

## 2023-07-08 NOTE — Progress Notes (Signed)
PATIENT: Amanda King DOB: 05-Oct-1940  REASON FOR VISIT: follow up HISTORY FROM: patient PRIMARY NEUROLOGIST: Dr. Vickey Huger  Chief Complaint  Patient presents with   Rm 9    Patient is here alone for narcolepsy follow-up. She states she has been stable. ESS 5     HISTORY OF PRESENT ILLNESS: Today 07/13/23  KEILAH King is a 83 y.o. female who has been followed in this office for Narcolepsy and OSA on CPAP. Returns today for follow-up.  She reports that the CPAP is working well.  She denies any new issues.  She continues on Tofranil and Ritalin for narcolepsy.  She states that the combination works well.  She states that if she laughs too hard or gets upset she may notice weakness in the knees.  Her download is below    HISTORY:  Amanda King is a 83 y.o. female patient who is here for revisit 01/05/2023 for  her newly diagnosed OSA on CPAP. Marland Kitchen  Chief concern according to patient :  Amanda King has been smitten by her CPAP machine.   She states that she can now sleep 7  or even  8 hours and she feels so much better.   She has gotten used to it with a nasal mask that covers her nose and is compliantly using CPAP with a great decrease in daytime fatigue and sleepiness.   Her compliance has been 100% for days and hours with an average of 5 hours 50 minutes.  She is using an AutoSet at the suggested setting between 5 and 16 with 3 cm water expiratory relief, her residual AHI is 1.9 down from 18.6 in her home sleep test, she had REM exacerbated apnea so this has truly helped her her 95th percentile pressure is 13.7 cm water she may even benefit in the future from a centimeter more pressure to choose from.  She tries to avoid sleeping in supine.  She seems to get a fairly good air seal as her air leak is only 13.2 L a minute at the 95th percentile.   The patient endorsed only 2 out of 15 points at the geriatric depression scale, 10 out of 24 on the Epworth Sleepiness Scale and 10 out  of 63 on the fatigue severity scale.      REVIEW OF SYSTEMS: Out of a complete 14 system review of symptoms, the patient complains only of the following symptoms, and all other reviewed systems are negative.  ESS 5  ALLERGIES: Allergies  Allergen Reactions   Penicillins Nausea And Vomiting and Rash    Has patient had a PCN reaction causing immediate rash, facial/tongue/throat swelling, SOB or lightheadedness with hypotension: Unknown Has patient had a PCN reaction causing severe rash involving mucus membranes or skin necrosis: Unknown Has patient had a PCN reaction that required hospitalization: Unknown Has patient had a PCN reaction occurring within the last 10 years: No If all of the above answers are "NO", then may proceed with Cephalosporin use.     HOME MEDICATIONS: Outpatient Medications Prior to Visit  Medication Sig Dispense Refill   cetirizine (ZYRTEC) 10 MG tablet Take 10 mg by mouth daily as needed for allergies or rhinitis.      doxycycline (VIBRA-TABS) 100 MG tablet Take 1 tablet (100 mg total) by mouth 2 (two) times daily. 14 tablet 0   empagliflozin (JARDIANCE) 25 MG TABS tablet TAKE 1 TABLET BY MOUTH ONCE DAILY BEFORE BREAKFAST 30 tablet 3   fenofibrate (TRICOR)  145 MG tablet Take 1 tablet by mouth once daily 90 tablet 1   glucose blood (ONETOUCH VERIO) test strip USE 1 STRIP TO CHECK GLUCOSE ONCE DAILY 100 each 2   HUMALOG KWIKPEN 100 UNIT/ML KwikPen INJECT 4 UNITS SUBCUTANEOUSLY WITH BREAKFAST AND 6 UNITS WITH SUPPER BEFORE MEALS 15 mL 3   hydrochlorothiazide (HYDRODIURIL) 25 MG tablet Take 1/2 (one-half) tablet by mouth once daily 45 tablet 2   imipramine (TOFRANIL) 50 MG tablet Take 1 tablet (50 mg total) by mouth at bedtime. 90 tablet 3   Insulin Pen Needle (B-D UF III MINI PEN NEEDLES) 31G X 5 MM MISC USE 1  THREE TIMES DAILY TO  INJECT  INSULIN 100 each 0   Insulin Pen Needle (B-D UF III MINI PEN NEEDLES) 31G X 5 MM MISC USE 1 PEN NEEDLE  THREE TIMES DAILY TO  INJECT INSULIN 100 each 2   LANTUS SOLOSTAR 100 UNIT/ML Solostar Pen INJECT 28 UNITS SUBCUTANEOUSLY ONCE DAILY. 15 mL 3   levothyroxine (SYNTHROID) 88 MCG tablet Take 1 tablet (88 mcg total) by mouth daily before breakfast. 90 tablet 0   lisinopril (ZESTRIL) 20 MG tablet Take 1 tablet by mouth once daily 90 tablet 2   metFORMIN (GLUCOPHAGE-XR) 750 MG 24 hr tablet Take 1 tablet (750 mg total) by mouth 2 (two) times daily. 180 tablet 0   methylphenidate (RITALIN) 20 MG tablet Take 0.5 tablets (10 mg total) by mouth 3 (three) times daily with meals. 135 tablet 0   OneTouch Delica Lancets 30G MISC USE 1  TO CHECK GLUCOSE ONCE DAILY 100 each 0   polyethylene glycol (MIRALAX / GLYCOLAX) packet Take 17 g by mouth daily as needed. 14 each 0   predniSONE (DELTASONE) 20 MG tablet Take 1 tablet (20 mg total) by mouth daily with breakfast. 5 tablet 0   Probiotic Product (ALIGN) 4 MG CAPS Take 1 capsule by mouth daily.     promethazine-dextromethorphan (PROMETHAZINE-DM) 6.25-15 MG/5ML syrup Take 5 mLs by mouth at bedtime as needed for cough. Caution of sedation 118 mL 0   rosuvastatin (CRESTOR) 20 MG tablet Take 1 tablet by mouth once daily 90 tablet 2   triamcinolone cream (KENALOG) 0.1 % Apply 1 Application topically 2 (two) times daily. To affected area /insect bite 15 g 0   No facility-administered medications prior to visit.    PAST MEDICAL HISTORY: Past Medical History:  Diagnosis Date   History of ischemic colitis 05/2018   Hyperlipidemia, mixed    Hypersomnia, persistent    Hypertension    Hypothyroidism    endocrinologist--- dr Lucianne Muss   Migraine    Narcolepsy with cataplexy    followed by neurologist--- dr dohmeier   Subcutaneous mass    left shoulder   Type 2 diabetes mellitus treated with insulin [facility])    endocrinologist--- dr a. Lucianne Muss   (12-02-2021  per pt check blood sugar daily 2 hours after evening meal)   Wears glasses    Wears hearing aid in both ears     PAST SURGICAL  HISTORY: Past Surgical History:  Procedure Laterality Date   APPENDECTOMY  1948   BIOPSY  05/24/2018   Procedure: BIOPSY;  Surgeon: Lemar Lofty., MD;  Location: [facility] ENDOSCOPY;  Service: Gastroenterology;;   COLONOSCOPY N/A 05/24/2018   Procedure: COLONOSCOPY;  Surgeon: Lemar Lofty., MD;  Location[facility] ENDOSCOPY;  Service: Gastroenterology;  Laterality: N/A;   MASS EXCISION Left 12/06/2021   Procedure: LEFT SHOULDER SUBCUTANEOUS MASS EXCISION;  Surgeon: Dossie Der,  Hyman Hopes, MD;  Location: [facility];  Service: General;  Laterality: Left;   POSTERIOR CERVICAL FUSION/FORAMINOTOMY  02/05/1975   C5-C6-C7   VAGINAL HYSTERECTOMY  1971    FAMILY HISTORY: Family History  Problem Relation Age of Onset   Heart disease Mother        MI   Peripheral vascular disease Mother    Hyperlipidemia Mother    Depression Brother        suicide   Diabetes Brother    Diabetes Brother    Sleep apnea Brother    Sleep apnea Son    Diabetes Son    Heart attack Son    Sudden Cardiac Death Son     SOCIAL HISTORY: Social History   Socioeconomic History   Marital status: Widowed    Spouse name: Not on file   Number of children: 3   Years of education: GED   Highest education level: Not on file  Occupational History   Occupation: works 1 day a week  Tobacco Use   Smoking status: Former    Current packs/day: 0.00    Types: Cigarettes    Start date: 72    Quit date: 1982    Years since quitting: 43.1   Smokeless tobacco: Never  Vaping Use   Vaping status: Never Used  Substance and Sexual Activity   Alcohol use: No    Alcohol/week: 0.0 standard drinks of alcohol   Drug use: Never   Sexual activity: Not on file  Other Topics Concern   Not on file  Social History Narrative   Patient is widowed.  Walks for exercise.  Patient has three children.Patient is right-handed.Patient drinks three cups of caffeine daily.  Patient is retired.Patient has a GED.    Social Drivers of Corporate investment banker Strain: Low Risk  (09/25/2022)   Overall Financial Resource Strain (CARDIA)    Difficulty of Paying Living Expenses: Not hard at all  Food Insecurity: No Food Insecurity (09/25/2022)   Hunger Vital Sign    Worried About Running Out of Food in the Last Year: Never true    Ran Out of Food in the Last Year: Never true  Transportation Needs: No Transportation Needs (09/25/2022)   PRAPARE - Administrator, Civil Service (Medical): No    Lack of Transportation (Non-Medical): No  Physical Activity: Inactive (09/25/2022)   Exercise Vital Sign    Days of Exercise per Week: 0 days    Minutes of Exercise per Session: 0 min  Stress: No Stress Concern Present (09/25/2022)   [employer] of Occupational Health - Occupational Stress Questionnaire    Feeling of Stress : Not at all  Social Connections: Moderately Isolated (09/25/2022)   Social Connection and Isolation Panel [NHANES]    Frequency of Communication with Friends and Family: More than three times a week    Frequency of Social Gatherings with Friends and Family: More than three times a week    Attends Religious Services: More than 4 times per year    Active Member of [employer] or Organizations: No    Attends Banker Meetings: Never    Marital Status: Widowed  Intimate Partner Violence: Not At Risk (09/25/2022)   Humiliation, Afraid, Rape, and Kick questionnaire    Fear of Current or Ex-Partner: No    Emotionally Abused: No    Physically Abused: No    Sexually Abused: No      PHYSICAL EXAM  Vitals:  07/13/23 1410  BP: 138/71  Pulse: 88  Weight: 167 lb (75.8 kg)  Height: 5\' 2"  (1.575 m)   Body mass index is 30.54 kg/m.  Generalized: Well developed, in no acute distress  Chest: Lungs clear to auscultation  Neurological examination  Mentation: Alert oriented to time, place, history taking. Follows all commands speech and language fluent Cranial nerve  II-XII: Facial symmetry noted  DIAGNOSTIC DATA (LABS, IMAGING, TESTING) - I reviewed patient records, labs, notes, testing and imaging myself where available.  Lab Results  Component Value Date   WBC 7.4 03/05/2023   HGB 13.7 03/05/2023   HCT 42.1 03/05/2023   MCV 89.9 03/05/2023   PLT 293.0 03/05/2023      Component Value Date/Time   NA 140 07/09/2023 0816   K 4.0 07/09/2023 0816   CL 102 07/09/2023 0816   CO2 31 07/09/2023 0816   GLUCOSE 108 (H) 07/09/2023 0816   BUN 28 (H) 07/09/2023 0816   CREATININE 1.12 (H) 07/09/2023 0816   CALCIUM 9.7 07/09/2023 0816   PROT 7.2 03/05/2023 1138   ALBUMIN 4.4 03/05/2023 1138   AST 20 03/05/2023 1138   ALT 23 03/05/2023 1138   ALKPHOS 64 03/05/2023 1138   BILITOT 0.3 03/05/2023 1138   GFRNONAA >60 05/24/2018 0454   GFRAA >60 05/24/2018 0454   Lab Results  Component Value Date   CHOL 158 03/05/2023   HDL 56.20 03/05/2023   LDLCALC 68 03/05/2023   LDLDIRECT 110.0 02/12/2022   TRIG 173.0 (H) 03/05/2023   CHOLHDL 3 03/05/2023   Lab Results  Component Value Date   HGBA1C 9.4 (H) 07/09/2023   No results found for: "VITAMINB12" Lab Results  Component Value Date   TSH 3.51 03/05/2023      ASSESSMENT AND PLAN 83 y.o. year old female  has a past medical history of History of ischemic colitis (05/2018), Hyperlipidemia, mixed, Hypersomnia, persistent, Hypertension, Hypothyroidism, Migraine, Narcolepsy with cataplexy, Subcutaneous mass, Type 2 diabetes mellitus treated with insulin (HCC), Wears glasses, and Wears hearing aid in both ears. here with:  Obstructive sleep apnea on CPAP Narcolepsy with cataplexy  -CPAP report shows good compliance -Good treatment of apnea -Continue Tofranil 50 mg at bedtime -Continue Ritalin 10 mg 3 times a day -Advised if symptoms worsen or she develops new symptoms she should let us know -Follow-up in 6 to 8 months or sooner if needed     Butch Penny, MSN, NP-C 07/13/2023, 2:08 PM [facility]  [facility] [address] [place], [place] [address] [phone]

## 2023-07-08 NOTE — Telephone Encounter (Signed)
Orders Placed This Encounter  Procedures   Microalbumin / creatinine urine ratio   Hemoglobin A1c   Basic metabolic panel

## 2023-07-09 ENCOUNTER — Other Ambulatory Visit: Payer: Medicare Other

## 2023-07-09 DIAGNOSIS — Z794 Long term (current) use of insulin: Secondary | ICD-10-CM | POA: Diagnosis not present

## 2023-07-09 DIAGNOSIS — E1165 Type 2 diabetes mellitus with hyperglycemia: Secondary | ICD-10-CM | POA: Diagnosis not present

## 2023-07-10 ENCOUNTER — Encounter: Payer: Self-pay | Admitting: Endocrinology

## 2023-07-10 LAB — BASIC METABOLIC PANEL
BUN/Creatinine Ratio: 25 (calc) — ABNORMAL HIGH (ref 6–22)
BUN: 28 mg/dL — ABNORMAL HIGH (ref 7–25)
CO2: 31 mmol/L (ref 20–32)
Calcium: 9.7 mg/dL (ref 8.6–10.4)
Chloride: 102 mmol/L (ref 98–110)
Creat: 1.12 mg/dL — ABNORMAL HIGH (ref 0.60–0.95)
Glucose, Bld: 108 mg/dL — ABNORMAL HIGH (ref 65–99)
Potassium: 4 mmol/L (ref 3.5–5.3)
Sodium: 140 mmol/L (ref 135–146)

## 2023-07-10 LAB — MICROALBUMIN / CREATININE URINE RATIO
Creatinine, Urine: 63 mg/dL (ref 20–275)
Microalb Creat Ratio: 6 mg/g{creat} (ref <30)
Microalb, Ur: 0.4 mg/dL

## 2023-07-10 LAB — HEMOGLOBIN A1C
Hgb A1c MFr Bld: 9.4 %{Hb} — ABNORMAL HIGH (ref <5.7)
Mean Plasma Glucose: 223 mg/dL
eAG (mmol/L): 12.4 mmol/L

## 2023-07-13 ENCOUNTER — Ambulatory Visit: Payer: Medicare Other | Admitting: Adult Health

## 2023-07-13 ENCOUNTER — Encounter: Payer: Self-pay | Admitting: Adult Health

## 2023-07-13 VITALS — BP 138/71 | HR 88 | Ht 62.0 in | Wt 167.0 lb

## 2023-07-13 DIAGNOSIS — G4733 Obstructive sleep apnea (adult) (pediatric): Secondary | ICD-10-CM

## 2023-07-13 DIAGNOSIS — G47411 Narcolepsy with cataplexy: Secondary | ICD-10-CM

## 2023-07-13 NOTE — Patient Instructions (Signed)
CPAP report shows good compliance Good treatment of apnea Continue Tofranil 50 mg at bedtime Continue Ritalin 10 mg 3 times a day

## 2023-07-15 ENCOUNTER — Ambulatory Visit: Payer: Medicare Other | Admitting: Endocrinology

## 2023-07-15 ENCOUNTER — Encounter: Payer: Self-pay | Admitting: Endocrinology

## 2023-07-15 VITALS — BP 142/74 | HR 94 | Ht 62.0 in | Wt 166.0 lb

## 2023-07-15 DIAGNOSIS — E1165 Type 2 diabetes mellitus with hyperglycemia: Secondary | ICD-10-CM | POA: Diagnosis not present

## 2023-07-15 DIAGNOSIS — Z794 Long term (current) use of insulin: Secondary | ICD-10-CM | POA: Diagnosis not present

## 2023-07-15 MED ORDER — LANTUS SOLOSTAR 100 UNIT/ML ~~LOC~~ SOPN: PEN_INJECTOR | SUBCUTANEOUS | 3 refills | Status: DC

## 2023-07-15 MED ORDER — FENOFIBRATE 145 MG PO TABS: 145.0000 mg | ORAL_TABLET | Freq: Every day | ORAL | 3 refills | Status: DC

## 2023-07-15 MED ORDER — EMPAGLIFLOZIN 25 MG PO TABS: 25.0000 mg | ORAL_TABLET | Freq: Every day | ORAL | 3 refills | Status: DC

## 2023-07-15 MED ORDER — ONETOUCH VERIO VI STRP: ORAL_STRIP | 2 refills | Status: DC

## 2023-07-15 MED ORDER — METFORMIN HCL ER 750 MG PO TB24: 750.0000 mg | ORAL_TABLET | Freq: Two times a day (BID) | ORAL | 3 refills | Status: DC

## 2023-07-15 NOTE — Patient Instructions (Signed)
 Diabetes regimen: Increase lantus  to 30 units daily. Humalog  4 units with breakfast and 6 units supper. Metformin  750mg  two times a day. Continue Jardiance .

## 2023-07-15 NOTE — Progress Notes (Signed)
 Outpatient Endocrinology Note Amanda Bienkowski, MD  07/15/23  Patient's Name: Amanda King    DOB: 08-20-1940    MRN: [id number]                                                    REASON OF VISIT: Follow up of type 2 diabetes mellitus  PCP: Tower, Laine LABOR, MD  HISTORY OF PRESENT ILLNESS:   Amanda King is a 83 y.o. old female with past medical history listed below, is here for follow up for type 2 diabetes mellitus.   Pertinent Diabetes History: Patient was diagnosed with type 2 diabetes mellitus in 2008.  Patient has uncontrolled type 2 diabetes mellitus.  Chronic Diabetes Complications : Retinopathy: no. Last ophthalmology exam was done on annually, reportedly, following with ophthalmology regularly.  Nephropathy: CKD IIa, on ACE/ARB /lisinopril . Peripheral neuropathy: no Coronary artery disease: no Stroke: no  Relevant comorbidities and cardiovascular risk factors: Obesity: yes Body mass index is 30.36 kg/m.  Hypertension: Yes  Hyperlipidemia : Yes, on statin   Current / Home Diabetic regimen includes: Lantus  28 units in the evening. Metformin  extended release 750 mg 2 times a day. Jardiance  25 mg daily.  Prior diabetic medications: She had taken glipizide , Januvia  in the past.  Diarrhea with higher dose of metformin  in the past.  Glycemic data:    She has One Touch Verio flex glucometer.  She has been checking blood sugar occasionally in the morning and rarely in the afternoon.  Some of the fasting blood sugar 170, 122, 161, 178, 181.  Afternoon blood sugar 139.    Hypoglycemia: Patient has no hypoglycemic episodes. Patient has hypoglycemia awareness.  Factors modifying glucose control: 1.  Diabetic diet assessment: 3 meals a day.  2.  Staying active or exercising: No formal exercise.  She will be active during daytime with grandkids.  3.  Medication compliance: compliant all of the time.  # Hypothyroidism -Currently taking levothyroxine  88 mcg daily since  November 2022.  Recent thyroid  lab normal.  Interval history  Diabetes regimen reviewed and as noted above.  Glucometer data as reviewed above.  Hemoglobin A1c worsening 9.4%.  Other lab results acceptable.  Patient reports she was not able to take Jardiance  for a few months due to donut hole and finally able to refill any started from the beginning of last month.  No other complaints today.  REVIEW OF SYSTEMS As per history of present illness.   PAST MEDICAL HISTORY: Past Medical History:  Diagnosis Date   History of ischemic colitis 05/2018   Hyperlipidemia, mixed    Hypersomnia, persistent    Hypertension    Hypothyroidism    endocrinologist--- dr von   Migraine    Narcolepsy with cataplexy    followed by neurologist--- dr dohmeier   Subcutaneous mass    left shoulder   Type 2 diabetes mellitus treated with insulin  [facility])    endocrinologist--- dr a. kumar   (12-02-2021  per pt check blood sugar daily 2 hours after evening meal)   Wears glasses    Wears hearing aid in both ears     PAST SURGICAL HISTORY: Past Surgical History:  Procedure Laterality Date   APPENDECTOMY  1948   BIOPSY  05/24/2018   Procedure: BIOPSY;  Surgeon: Wilhelmenia Aloha Raddle., MD;  Location: [facility] ENDOSCOPY;  Service:  Gastroenterology;;   COLONOSCOPY N/A 05/24/2018   Procedure: COLONOSCOPY;  Surgeon: Wilhelmenia Aloha Raddle., MD;  Location[facility] END[facility]COPY;  Service: Gastroenterology;  Laterality: N/A;   MASS EXCISION Left 12/06/2021   Procedure: LEFT SHOULDER SUBCUTANEOUS MASS EXCISION;  Surgeon: Lyndel Deward PARAS, MD;  Location: [facility];  Service: General;  Laterality: Left;   POSTERIOR CERVICAL FUSION/FORAMINOTOMY  02/05/1975   C5-C6-C7   VAGINAL HYSTERECTOMY  1971    ALLERGIES: Allergies  Allergen Reactions   Penicillins Nausea And Vomiting and Rash    Has patient had a PCN reaction causing immediate rash, facial/tongue/throat swelling, SOB or lightheadedness with hypotension:  Unknown Has patient had a PCN reaction causing severe rash involving mucus membranes or skin necrosis: Unknown Has patient had a PCN reaction that required hospitalization: Unknown Has patient had a PCN reaction occurring within the last 10 years: No If all of the above answers are NO, then may proceed with Cephalosporin use.     FAMILY HISTORY:  Family History  Problem Relation Age of Onset   Heart disease Mother        MI   Peripheral vascular disease Mother    Hyperlipidemia Mother    Depression Brother        suicide   Diabetes Brother    Diabetes Brother    Sleep apnea Brother    Sleep apnea Son    Diabetes Son    Heart attack Son    Sudden Cardiac Death Son     SOCIAL HISTORY: Social History   Socioeconomic History   Marital status: Widowed    Spouse name: Not on file   Number of children: 3   Years of education: GED   Highest education level: Not on file  Occupational History   Occupation: works 1 day a week  Tobacco Use   Smoking status: Former    Current packs/day: 0.00    Types: Cigarettes    Start date: 55    Quit date: 1982    Years since quitting: 43.1   Smokeless tobacco: Never  Vaping Use   Vaping status: Never Used  Substance and Sexual Activity   Alcohol use: No    Alcohol/week: 0.0 standard drinks of alcohol   Drug use: Never   Sexual activity: Not on file  Other Topics Concern   Not on file  Social History Narrative   Patient is widowed. Patient has three children.Patient is right-handed.Patient drinks two cups of caffeine daily.  Patient is retired.Patient has a GED.   Social Drivers of Corporate Investment Banker Strain: Low Risk  (09/25/2022)   Overall Financial Resource Strain (CARDIA)    Difficulty of Paying Living Expenses: Not hard at all  Food Insecurity: No Food Insecurity (09/25/2022)   Hunger Vital Sign    Worried About Running Out of Food in the Last Year: Never true    Ran Out of Food in the Last Year: Never true   Transportation Needs: No Transportation Needs (09/25/2022)   PRAPARE - Administrator, Civil Service (Medical): No    Lack of Transportation (Non-Medical): No  Physical Activity: Inactive (09/25/2022)   Exercise Vital Sign    Days of Exercise per Week: 0 days    Minutes of Exercise per Session: 0 min  Stress: No Stress Concern Present (09/25/2022)   [employer] of Occupational Health - Occupational Stress Questionnaire    Feeling of Stress : Not at all  Social Connections: Moderately Isolated (09/25/2022)  Social Advertising Account Executive [NHANES]    Frequency of Communication with Friends and Family: More than three times a week    Frequency of Social Gatherings with Friends and Family: More than three times a week    Attends Religious Services: More than 4 times per year    Active Member of [employer] or Organizations: No    Attends Banker Meetings: Never    Marital Status: Widowed    MEDICATIONS:  Current Outpatient Medications  Medication Sig Dispense Refill   cetirizine (ZYRTEC) 10 MG tablet Take 10 mg by mouth daily as needed for allergies or rhinitis.      HUMALOG  KWIKPEN 100 UNIT/ML KwikPen INJECT 4 UNITS SUBCUTANEOUSLY WITH BREAKFAST AND 6 UNITS WITH SUPPER BEFORE MEALS 15 mL 3   hydrochlorothiazide  (HYDRODIURIL ) 25 MG tablet Take 1/2 (one-half) tablet by mouth once daily 45 tablet 2   imipramine  (TOFRANIL ) 50 MG tablet Take 1 tablet (50 mg total) by mouth at bedtime. 90 tablet 3   Insulin  Pen Needle (B-D UF III MINI PEN NEEDLES) 31G X 5 MM MISC USE 1  THREE TIMES DAILY TO  INJECT  INSULIN  100 each 0   Insulin  Pen Needle (B-D UF III MINI PEN NEEDLES) 31G X 5 MM MISC USE 1 PEN NEEDLE  THREE TIMES DAILY TO INJECT INSULIN  100 each 2   levothyroxine  (SYNTHROID ) 88 MCG tablet Take 1 tablet (88 mcg total) by mouth daily before breakfast. 90 tablet 0   lisinopril  (ZESTRIL ) 20 MG tablet Take 1 tablet by mouth once daily 90 tablet 2   methylphenidate   (RITALIN ) 20 MG tablet Take 0.5 tablets (10 mg total) by mouth 3 (three) times daily with meals. 135 tablet 0   OneTouch Delica Lancets 30G MISC USE 1  TO CHECK GLUCOSE ONCE DAILY 100 each 0   polyethylene glycol (MIRALAX  / GLYCOLAX ) packet Take 17 g by mouth daily as needed. 14 each 0   Probiotic Product (ALIGN) 4 MG CAPS Take 1 capsule by mouth daily.     rosuvastatin  (CRESTOR ) 20 MG tablet Take 1 tablet by mouth once daily 90 tablet 2   empagliflozin  (JARDIANCE ) 25 MG TABS tablet Take 1 tablet (25 mg total) by mouth daily before breakfast. 90 tablet 3   fenofibrate  (TRICOR ) 145 MG tablet Take 1 tablet (145 mg total) by mouth daily. 90 tablet 3   glucose blood (ONETOUCH VERIO) test strip USE 1 STRIP TO CHECK GLUCOSE 3 times DAILY 300 each 2   LANTUS  SOLOSTAR 100 UNIT/ML Solostar Pen INJECT 30 UNITS SUBCUTANEOUSLY ONCE DAILY. 15 mL 3   metFORMIN  (GLUCOPHAGE -XR) 750 MG 24 hr tablet Take 1 tablet (750 mg total) by mouth 2 (two) times daily. 180 tablet 3   No current facility-administered medications for this visit.    PHYSICAL EXAM: Vitals:   07/15/23 1307  BP: (!) 142/74  Pulse: 94  SpO2: 98%  Weight: 166 lb (75.3 kg)  Height: 5' 2 (1.575 m)   Body mass index is 30.36 kg/m.  Wt Readings from Last 3 Encounters:  07/15/23 166 lb (75.3 kg)  07/13/23 167 lb (75.8 kg)  06/09/23 168 lb 4 oz (76.3 kg)    General: Well developed, well nourished female in no apparent distress.  HEENT: AT/[place], no external lesions.  Eyes: Conjunctiva clear and no icterus. Neck: Neck supple  Lungs: Respirations not labored Neurologic: Alert, oriented, normal speech Extremities / Skin: Dry.  Psychiatric: Does not appear depressed or anxious  Diabetic Foot Exam - Simple  Simple Foot Form Visual Inspection No deformities, no ulcerations, no other skin breakdown bilaterally: Yes Sensation Testing Intact to touch and monofilament testing bilaterally: Yes Pulse Check Posterior Tibialis and Dorsalis pulse  intact bilaterally: Yes Comments    LABS Reviewed Lab Results  Component Value Date   HGBA1C 9.4 (H) 07/09/2023   HGBA1C 8.2 (A) 04/14/2023   HGBA1C 7.7 (A) 12/01/2022   Lab Results  Component Value Date   FRUCTOSAMINE 309 (H) 06/18/2021   FRUCTOSAMINE 267 11/22/2018   FRUCTOSAMINE 349 (H) 08/18/2018   Lab Results  Component Value Date   CHOL 158 03/05/2023   HDL 56.20 03/05/2023   LDLCALC 68 03/05/2023   LDLDIRECT 110.0 02/12/2022   TRIG 173.0 (H) 03/05/2023   CHOLHDL 3 03/05/2023   Lab Results  Component Value Date   MICRALBCREAT 6 07/09/2023   MICRALBCREAT 1.6 11/12/2021   Lab Results  Component Value Date   CREATININE 1.12 (H) 07/09/2023   Lab Results  Component Value Date   GFR 47.04 (L) 03/05/2023    ASSESSMENT / PLAN  1. Uncontrolled type 2 diabetes mellitus with hyperglycemia, with long-term current use of insulin  (HCC)   2. Type 2 diabetes mellitus with hyperglycemia, with long-term current use of insulin  (HCC)      Diabetes Mellitus type 2, complicated by CKD. - Diabetic status / severity: Uncontrolled.  Lab Results  Component Value Date   HGBA1C 9.4 (H) 07/09/2023    - Hemoglobin A1c goal : <7.5%  Patient has worsening diabetes control.  Patient reports he was not taking Jardiance  for few months and restarted from last month.  - Medications: See below.  I) Increase lantus  from 28 to 30 units daily. II) continue Humalog  4 units with breakfast and 6 units supper. III) continue metformin  750mg  two times a day. IV) continue Jardiance  25 mg daily.  - Home glucose testing: With meals and bedtime 3-4 times a day.  Advised to check blood sugar more often.  - Discussed/ Gave Hypoglycemia treatment plan.  # Consult : not required at this time.   # Annual urine for microalbuminuria/ creatinine ratio, no microalbuminuria currently, continue ACE/ARB /lisinopril .   Last  Lab Results  Component Value Date   MICRALBCREAT 6 07/09/2023    # Foot  check nightly / neuropathy.  # Annual dilated diabetic eye exams.   - Diet: Make healthy diabetic food choices - Life style / activity / exercise: Discussed.  2. Blood pressure  -  BP Readings from Last 1 Encounters:  07/15/23 (!) 142/74    - Control is in target.  - No change in current plans.  3. Lipid status / Hyperlipidemia - Last  Lab Results  Component Value Date   LDLCALC 68 03/05/2023   - Continue rosuvastatin  20 mg daily.  # Hypothyroidism -Continue levothyroxine  88 mcg daily.  Diagnoses and all orders for this visit:  Uncontrolled type 2 diabetes mellitus with hyperglycemia, with long-term current use of insulin  (HCC) -     LANTUS  SOLOSTAR 100 UNIT/ML Solostar Pen; INJECT 30 UNITS SUBCUTANEOUSLY ONCE DAILY. -     empagliflozin  (JARDIANCE ) 25 MG TABS tablet; Take 1 tablet (25 mg total) by mouth daily before breakfast. -     metFORMIN  (GLUCOPHAGE -XR) 750 MG 24 hr tablet; Take 1 tablet (750 mg total) by mouth 2 (two) times daily. -     glucose blood (ONETOUCH VERIO) test strip; USE 1 STRIP TO CHECK GLUCOSE 3 times DAILY  Type 2 diabetes mellitus with hyperglycemia, with long-term  current use of insulin  (HCC) -     fenofibrate  (TRICOR ) 145 MG tablet; Take 1 tablet (145 mg total) by mouth daily. -     BASIC METABOLIC PANEL WITH GFR -     Hemoglobin A1c     DISPOSITION Follow up in clinic in 3 months suggested.  Labs prior to follow-up visit.   All questions answered and patient verbalized understanding of the plan.  Ceana Fiala, MD [facility] Endocrinology [facility] [address] [place], [address] [place], [place] [address] Phone # [phone]  At least part of this note was generated using voice recognition software. Inadvertent word errors may have occurred, which were not recognized during the proofreading process.

## 2023-08-25 ENCOUNTER — Other Ambulatory Visit: Payer: Self-pay | Admitting: *Deleted

## 2023-08-25 DIAGNOSIS — G47411 Narcolepsy with cataplexy: Secondary | ICD-10-CM

## 2023-08-25 MED ORDER — IMIPRAMINE HCL 50 MG PO TABS: 50.0000 mg | ORAL_TABLET | Freq: Every day | ORAL | 1 refills | Status: DC

## 2023-08-25 NOTE — Telephone Encounter (Signed)
 Last seen on 07/13/23 Follow up scheduled on 01/11/24

## 2023-09-15 ENCOUNTER — Other Ambulatory Visit: Payer: Self-pay

## 2023-09-29 ENCOUNTER — Ambulatory Visit (INDEPENDENT_AMBULATORY_CARE_PROVIDER_SITE_OTHER): Payer: Medicare Other

## 2023-09-29 VITALS — Ht 62.0 in | Wt 166.0 lb

## 2023-09-29 DIAGNOSIS — Z0000 Encounter for general adult medical examination without abnormal findings: Secondary | ICD-10-CM | POA: Diagnosis not present

## 2023-09-29 NOTE — Progress Notes (Signed)
 Subjective:   Amanda King is a 83 y.o. who presents for a Medicare Wellness preventive visit.  Visit Complete: Virtual I connected with  Amanda King on 09/29/23 by a audio enabled telemedicine application and verified that I am speaking with the correct person using two identifiers.  Patient Location: Home  Provider Location: Office/Clinic  I discussed the limitations of evaluation and management by telemedicine. The patient expressed understanding and agreed to proceed.  Vital Signs: Because this visit was a virtual/telehealth visit, some criteria may be missing or patient reported. Any vitals not documented were not able to be obtained and vitals that have been documented are patient reported.  VideoDeclined- This patient declined Librarian, academic. Therefore the visit was completed with audio only.  Persons Participating in Visit: Patient.  AWV Questionnaire: No: Patient Medicare AWV questionnaire was not completed prior to this visit.  Cardiac Risk Factors include: advanced age (>87men, >65 women);diabetes mellitus;dyslipidemia;hypertension;sedentary lifestyle;obesity (BMI >30kg/m2)     Objective:    Today's Vitals   09/29/23 1124  Weight: 166 lb (75.3 kg)  Height: 5\' 2"  (1.575 m)   Body mass index is 30.36 kg/m.     09/29/2023   11:34 AM 09/25/2022   11:50 AM 12/06/2021    7:37 AM 09/17/2021   11:39 AM 03/31/2019    9:07 AM 08/12/2018    9:44 AM 05/17/2018    4:28 PM  Advanced Directives  Does Patient Have a Medical Advance Directive? No Yes No No No No   Type of Special educational needs teacher of [place];Living will       Copy of Healthcare Power of Attorney in Chart?  No - copy requested       Would patient like information on creating a medical advance directive?   No - Patient declined No - Patient declined No - Patient declined Yes (MAU/Ambulatory/Procedural Areas - Information given) No - Patient declined    Current  Medications (verified) Outpatient Encounter Medications as of 09/29/2023  Medication Sig   cetirizine (ZYRTEC) 10 MG tablet Take 10 mg by mouth daily as needed for allergies or rhinitis.    empagliflozin  (JARDIANCE ) 25 MG TABS tablet Take 1 tablet (25 mg total) by mouth daily before breakfast.   fenofibrate  (TRICOR ) 145 MG tablet Take 1 tablet (145 mg total) by mouth daily.   glucose blood (ONETOUCH VERIO) test strip USE 1 STRIP TO CHECK GLUCOSE 3 times DAILY   HUMALOG  KWIKPEN 100 UNIT/ML KwikPen INJECT 4 UNITS SUBCUTANEOUSLY WITH BREAKFAST AND 6 UNITS WITH SUPPER BEFORE MEALS   hydrochlorothiazide  (HYDRODIURIL ) 25 MG tablet Take 1/2 (one-half) tablet by mouth once daily   imipramine  (TOFRANIL ) 50 MG tablet Take 1 tablet (50 mg total) by mouth at bedtime.   Insulin  Pen Needle (B-D UF III MINI PEN NEEDLES) 31G X 5 MM MISC USE 1  THREE TIMES DAILY TO  INJECT  INSULIN    Insulin  Pen Needle (B-D UF III MINI PEN NEEDLES) 31G X 5 MM MISC USE 1 PEN NEEDLE  THREE TIMES DAILY TO INJECT INSULIN    LANTUS  SOLOSTAR 100 UNIT/ML Solostar Pen INJECT 30 UNITS SUBCUTANEOUSLY ONCE DAILY.   levothyroxine  (SYNTHROID ) 88 MCG tablet Take 1 tablet (88 mcg total) by mouth daily before breakfast.   lisinopril  (ZESTRIL ) 20 MG tablet Take 1 tablet by mouth once daily   metFORMIN  (GLUCOPHAGE -XR) 750 MG 24 hr tablet Take 1 tablet (750 mg total) by mouth 2 (two) times daily.   methylphenidate  (RITALIN ) 20 MG  tablet Take 0.5 tablets (10 mg total) by mouth 3 (three) times daily with meals.   OneTouch Delica Lancets 30G MISC USE 1  TO CHECK GLUCOSE ONCE DAILY   polyethylene glycol (MIRALAX  / GLYCOLAX ) packet Take 17 g by mouth daily as needed.   Probiotic Product (ALIGN) 4 MG CAPS Take 1 capsule by mouth daily.   rosuvastatin  (CRESTOR ) 20 MG tablet Take 1 tablet by mouth once daily   No facility-administered encounter medications on file as of 09/29/2023.    Allergies (verified) Penicillins   History: Past Medical History:   Diagnosis Date   History of ischemic colitis 05/2018   Hyperlipidemia, mixed    Hypersomnia, persistent    Hypertension    Hypothyroidism    endocrinologist--- dr Hubert Madden   Migraine    Narcolepsy with cataplexy    followed by neurologist--- dr dohmeier   Subcutaneous mass    left shoulder   Type 2 diabetes mellitus treated with insulin  [facility])    endocrinologist--- dr a. Hubert Madden   (12-02-2021  per pt check blood sugar daily 2 hours after evening meal)   Wears glasses    Wears hearing aid in both ears    Past Surgical History:  Procedure Laterality Date   APPENDECTOMY  1948   BIOPSY  05/24/2018   Procedure: BIOPSY;  Surgeon: Normie Becton., MD;  Location: [facility] ENDOSCOPY;  Service: Gastroenterology;;   COLONOSCOPY N/A 05/24/2018   Procedure: COLONOSCOPY;  Surgeon: Normie Becton., MD;  Location: [facility] ENDOSCOPY;  Service: Gastroenterology;  Laterality: N/A;   MASS EXCISION Left 12/06/2021   Procedure: LEFT SHOULDER SUBCUTANEOUS MASS EXCISION;  Surgeon: Junie Olds, MD;  Location: [facility];  Service: General;  Laterality: Left;   POSTERIOR CERVICAL FUSION/FORAMINOTOMY  02/05/1975   C5-C6-C7   VAGINAL HYSTERECTOMY  1971   Family History  Problem Relation Age of Onset   Heart disease Mother        MI   Peripheral vascular disease Mother    Hyperlipidemia Mother    Depression Brother        suicide   Diabetes Brother    Diabetes Brother    Sleep apnea Brother    Sleep apnea Son    Diabetes Son    Heart attack Son    Sudden Cardiac Death Son    Social History   Socioeconomic History   Marital status: Widowed    Spouse name: Not on file   Number of children: 3   Years of education: GED   Highest education level: Not on file  Occupational History   Occupation: works 1 day a week  Tobacco Use   Smoking status: Former    Current packs/day: 0.00    Types: Cigarettes    Start date: 72    Quit date: 1982    Years since quitting: 43.3    Smokeless tobacco: Never  Vaping Use   Vaping status: Never Used  Substance and Sexual Activity   Alcohol use: No    Alcohol/week: 0.0 standard drinks of alcohol   Drug use: Never   Sexual activity: Not on file  Other Topics Concern   Not on file  Social History Narrative   Patient is widowed. Patient has three children.Patient is right-handed.Patient drinks two cups of caffeine daily.  Patient is retired.Patient has a GED.   Social Drivers of Health   Financial Resource Strain: Low Risk  (09/29/2023)   Overall Financial Resource Strain (CARDIA)    Difficulty of Paying  Living Expenses: Not hard at all  Food Insecurity: No Food Insecurity (09/29/2023)   Hunger Vital Sign    Worried About Running Out of Food in the Last Year: Never true    Ran Out of Food in the Last Year: Never true  Transportation Needs: No Transportation Needs (09/29/2023)   PRAPARE - Administrator, Civil Service (Medical): No    Lack of Transportation (Non-Medical): No  Physical Activity: Inactive (09/29/2023)   Exercise Vital Sign    Days of Exercise per Week: 0 days    Minutes of Exercise per Session: 0 min  Stress: No Stress Concern Present (09/29/2023)   [employer] of Occupational Health - Occupational Stress Questionnaire    Feeling of Stress : Not at all  Social Connections: Moderately Isolated (09/29/2023)   Social Connection and Isolation Panel [NHANES]    Frequency of Communication with Friends and Family: More than three times a week    Frequency of Social Gatherings with Friends and Family: More than three times a week    Attends Religious Services: More than 4 times per year    Active Member of [employer] or Organizations: No    Attends Banker Meetings: Never    Marital Status: Widowed    Tobacco Counseling Counseling given: Not Answered    Clinical Intake:  Pre-visit preparation completed: Yes  Pain : No/denies pain     BMI - recorded: 30.36 Nutritional  Status: BMI > 30  Obese Nutritional Risks: None Diabetes: Yes CBG done?: Yes (BS this am at home is 132) CBG resulted in Enter/ Edit results?: No Did pt. bring in CBG monitor from home?: No  Lab Results  Component Value Date   HGBA1C 9.4 (H) 07/09/2023   HGBA1C 8.2 (A) 04/14/2023   HGBA1C 7.7 (A) 12/01/2022     How often do you need to have someone help you when you read instructions, pamphlets, or other written materials from your doctor or pharmacy?: 1 - Never  Interpreter Needed?: No  Comments: lives alone Information entered by :: Amanda Pavlak,LPN   Activities of Daily Living     09/29/2023   11:34 AM  In your present state of health, do you have any difficulty performing the following activities:  Hearing? 0  Vision? 0  Difficulty concentrating or making decisions? 0  Walking or climbing stairs? 0  Dressing or bathing? 0  Doing errands, shopping? 0  Preparing Food and eating ? N  Using the Toilet? N  In the past six months, have you accidently leaked urine? N  Do you have problems with loss of bowel control? N  Managing your Medications? N  Managing your Finances? N  Housekeeping or managing your Housekeeping? N    Patient Care Team: Tower, Manley Seeds, MD as PCP - General Dohmeier, Raoul Byes, MD as Consulting Physician (Neurology) Lenda Quan, MD as Consulting Physician (Audiology)  Indicate any recent Medical Services you may have received from other than [facility] providers in the past year (date may be approximate).     Assessment:   This is a routine wellness examination for Amanda King.  Hearing/Vision screen Hearing Screening - Comments:: Pt says her hearing is &quot;lousy&quot;: hearing aids help Vision Screening - Comments:: Pt says her vision is good with glasses [facility] [place]   Goals Addressed             This Visit's Progress    Patient Stated   On track    09/29/23- I will continue  to take medications as prescribed.       COMPLETED: Patient Stated       03/31/2019, I will maintain and continue medications as prescribed.      Patient Stated   On track    09/29/23-Continue current lifestyle and keep active       Depression Screen     09/29/2023   11:32 AM 09/25/2022   11:50 AM 01/03/2022    3:27 PM 09/17/2021   11:44 AM 05/24/2020   10:30 AM 03/31/2019    9:10 AM 08/12/2018    9:46 AM  PHQ 2/9 Scores  PHQ - 2 Score 0 0 0 0 0 0 0  PHQ- 9 Score      0     Fall Risk     09/29/2023   11:27 AM 09/25/2022   11:51 AM 01/03/2022    3:27 PM 09/17/2021   11:59 AM 05/24/2020   10:30 AM  Fall Risk   Falls in the past year? 0 0 0 0 0  Number falls in past yr: 0 0  0   Injury with Fall? 0 0  0   Risk for fall due to : No Fall Risks No Fall Risks     Follow up Education provided;Falls prevention discussed Falls prevention discussed;Falls evaluation completed Falls evaluation completed Falls evaluation completed;Education provided;Falls prevention discussed Falls evaluation completed    MEDICARE RISK AT HOME:  Medicare Risk at Home Any stairs in or around the home?: Yes If so, are there any without handrails?: Yes Home free of loose throw rugs in walkways, pet beds, electrical cords, etc?: Yes Adequate lighting in your home to reduce risk of falls?: Yes Life alert?: No Use of a cane, walker or w/c?: Yes (sometimes when out) Grab bars in the bathroom?: Yes Shower chair or bench in shower?: Yes Elevated toilet seat or a handicapped toilet?: Yes  TIMED UP AND GO:  Was the test performed?  No  Cognitive Function: 6CIT completed    03/31/2019    9:15 AM 03/09/2018    1:33 PM 02/19/2016    9:38 AM  MMSE - Mini Mental State Exam  Orientation to time 5 5 5   Orientation to Place 5 5 5   Registration 3 3 3   Attention/ Calculation 5 0 0  Recall 3 3 3   Language- name 2 objects  0 0  Language- repeat 1 1 1   Language- follow 3 step command  3 3  Language- read & follow direction  0 0  Write a sentence  0 0   Copy design  0 0  Total score  20 20      06/26/2015    9:23 AM  Montreal Cognitive Assessment   Visuospatial/ Executive (0/5) 5  Naming (0/3) 2  Attention: Read list of digits (0/2) 2  Attention: Read list of letters (0/1) 1  Attention: Serial 7 subtraction starting at 100 (0/3) 2  Language: Repeat phrase (0/2) 1  Language : Fluency (0/1) 1  Abstraction (0/2) 2  Delayed Recall (0/5) 4  Orientation (0/6) 6  Total 26      09/29/2023   11:37 AM 09/25/2022   11:54 AM 09/17/2021   11:36 AM  6CIT Screen  What Year? 0 points 0 points 0 points  What month? 0 points 0 points 0 points  What time? 0 points 0 points 0 points  Count back from 20 0 points 0 points 0 points  Months in reverse 0 points 0 points 4  points  Repeat phrase 0 points 0 points 2 points  Total Score 0 points 0 points 6 points    Immunizations Immunization History  Administered Date(s) Administered   Fluad Quad(high Dose 65+) 02/22/2019, 04/16/2021   Influenza Split 03/10/2011   Influenza Whole 06/09/2002, 04/09/2007, 03/02/2008, 03/23/2009, 04/09/2010   Influenza, High Dose Seasonal PF 05/13/2017, 03/09/2018, 04/06/2020, 04/03/2022   Influenza,inj,Quad PF,6+ Mos 04/30/2015, 02/15/2016   Influenza-Unspecified 03/07/2014, 03/01/2023   PFIZER(Purple Top)SARS-COV-2 Vaccination 09/03/2019, 09/24/2019   Pneumococcal Conjugate-13 08/03/2015   Pneumococcal Polysaccharide-23 06/09/2002, 03/02/2008   Respiratory Syncytial Virus Vaccine,Recomb Aduvanted(Arexvy) 04/03/2022   Td 04/25/1997   Tdap 07/01/2011   Zoster Recombinant(Shingrix) 08/11/2023    Screening Tests Health Maintenance  Topic Date Due   MAMMOGRAM  10/12/2014   DTaP/Tdap/Td (3 - Td or Tdap) 06/30/2021   COVID-19 Vaccine (3 - 2024-25 season) 02/08/2023   FOOT EXAM  02/13/2023   Zoster Vaccines- Shingrix (2 of 2) 10/06/2023   HEMOGLOBIN A1C  01/06/2024   INFLUENZA VACCINE  01/08/2024   OPHTHALMOLOGY EXAM  01/08/2024   Diabetic kidney evaluation  - eGFR measurement  07/08/2024   Diabetic kidney evaluation - Urine ACR  07/08/2024   Medicare Annual Wellness (AWV)  09/28/2024   Pneumonia Vaccine 24+ Years old  Completed   DEXA SCAN  Completed   HPV VACCINES  Aged Out   Meningococcal B Vaccine  Aged Out    Health Maintenance  Health Maintenance Due  Topic Date Due   MAMMOGRAM  10/12/2014   DTaP/Tdap/Td (3 - Td or Tdap) 06/30/2021   COVID-19 Vaccine (3 - 2024-25 season) 02/08/2023   FOOT EXAM  02/13/2023   Health Maintenance Items Addressed: None needed  Additional Screening:  Vision Screening: Recommended annual ophthalmology exams for early detection of glaucoma and other disorders of the eye.  Dental Screening: Recommended annual dental exams for proper oral hygiene  Community Resource Referral / Chronic Care Management: CRR required this visit?  No   CCM required this visit?  No    Plan:     I have personally reviewed and noted the following in the patient's chart:   Medical and social history Use of alcohol, tobacco or illicit drugs  Current medications and supplements including opioid prescriptions. Patient is not currently taking opioid prescriptions. Functional ability and status Nutritional status Physical activity Advanced directives List of other physicians Hospitalizations, surgeries, and ER visits in previous 12 months Vitals Screenings to include cognitive, depression, and falls Referrals and appointments  In addition, I have reviewed and discussed with patient certain preventive protocols, quality metrics, and best practice recommendations. A written personalized care plan for preventive services as well as general preventive health recommendations were provided to patient.    AAMORI MCMASTERS, LPN   1/61/0960   After Visit Summary: (MyChart) Due to this being a telephonic visit, the after visit summary with patients personalized plan was offered to patient via MyChart   Notes: Nothing  significant to report at this time.

## 2023-09-29 NOTE — Patient Instructions (Signed)
 Amanda King , Thank you for taking time to come for your Medicare Wellness Visit. I appreciate your ongoing commitment to your health goals. Please review the following plan we discussed and let me know if I can assist you in the future.   Referrals/Orders/Follow-Ups/Clinician Recommendations: none  This is a list of the screening recommended for you and due dates:  Health Maintenance  Topic Date Due   Mammogram  10/12/2014   DTaP/Tdap/Td vaccine (3 - Td or Tdap) 06/30/2021   COVID-19 Vaccine (3 - 2024-25 season) 02/08/2023   Complete foot exam   02/13/2023   Zoster (Shingles) Vaccine (2 of 2) 10/06/2023   Hemoglobin A1C  01/06/2024   Flu Shot  01/08/2024   Eye exam for diabetics  01/08/2024   Yearly kidney function blood test for diabetes  07/08/2024   Yearly kidney health urinalysis for diabetes  07/08/2024   Medicare Annual Wellness Visit  09/28/2024   Pneumonia Vaccine  Completed   DEXA scan (bone density measurement)  Completed   HPV Vaccine  Aged Out   Meningitis B Vaccine  Aged Out    Advanced directives: (Declined) Advance directive discussed with you today. Even though you declined this today, please call our office should you change your mind, and we can give you the proper paperwork for you to fill out.  Next Medicare Annual Wellness Visit scheduled for next year: Yes 09/29/24 @ 11:30am televisit

## 2023-10-01 ENCOUNTER — Ambulatory Visit: Payer: Self-pay

## 2023-10-01 NOTE — Telephone Encounter (Signed)
 I will check her at tomorrow's visit

## 2023-10-01 NOTE — Telephone Encounter (Signed)
 Chief Complaint: Tick bite top of right foot near big/2nd toe Symptoms: redness at area from trying to get the tick out and squeezing area, tenderness Frequency: noticed the tick this morning Pertinent Negatives: Patient denies other symptoms Disposition: [] ED /[] Urgent Care (no appt availability in office) / [x] Appointment(In office/virtual)/ []  [facility]/ [] Home Care/ [] Refused Recommended Disposition /[] [facility]/ []  Follow-up with PCP Additional Notes: Patient says she was outside yesterday trimming rose bushes and thinks the tick got on her then, but she didn't notice it until this morning in the shower. She says she used tweezers to try to remove it and think she only removed half of it. She dropped what she pulled out and couldn't find it. She says the area is tender and she thinks part of it is still in there, so she wants a doctor to look at it and remove it. Advised no availability with PCP until next week and first available is tomorrow with any provider, she agrees. Scheduled with Dr. Joelle Musca.   Copied from CRM [id number]. Topic: Clinical - Red Word Triage >> Oct 01, 2023  9:38 AM Amanda King wrote: Red Word that prompted transfer to Nurse Triage: Patient stated she has a tick bite on foot. Reason for Disposition  [1] Red or very tender (to touch) area AND [2] started over 24 hours after the bite  Answer Assessment - Initial Assessment Questions 1. ATTACHED:  "Is the tick still on the skin?"  (e.g., yes, no, unsure)     Yes 2. ONSET - TICK STILL ATTACHED:  "How long do you think the tick has been on your skin?" (e.g., hours, days, unsure)  Note:  Is there a recent activity (camping, hiking) where the caller may have been exposed?     Since yesterday, out in the yard trimming up rose bush 3. ONSET - TICK NOT STILL ATTACHED: "If the tick has been removed, how long do you think the tick was attached before you removed it?" (e.g., 5 hours, 2 days). "When was this?"      Tried to remove it this morning with tweezers, but only part of the tick came out 4. LOCATION: "Where is the tick bite located?" (e.g., arm, leg)     On the right foot on the top almost between big toe and 2nd toe 5. TYPE of TICK: "Is it a wood tick or a deer tick?" (e.g., deer tick, wood tick; unsure)     Small tick 6. SIZE of TICK: "How big is the tick?" (e.g., size of poppy seed, apple seed, watermelon seed; unsure) Note: Deer ticks can be the size of a poppy seed (nymph) or an apple seed (adult).       Unsure 7. ENGORGED: "Did the tick look flat or engorged (full, swollen)?" (e.g., flat, engorged; unsure)     Unsure, it didn't bleed 8. OTHER SYMPTOMS: "Do you have any other symptoms?" (e.g., fever, rash, redness at bite area, red ring around bite)     Redness since messed with it  Protocols used: Tick Bite-A-AH

## 2023-10-02 ENCOUNTER — Encounter: Payer: Self-pay | Admitting: Internal Medicine

## 2023-10-02 ENCOUNTER — Ambulatory Visit (INDEPENDENT_AMBULATORY_CARE_PROVIDER_SITE_OTHER): Admitting: Internal Medicine

## 2023-10-02 VITALS — BP 118/76 | HR 94 | Temp 98.1°F | Ht 62.0 in | Wt 170.0 lb

## 2023-10-02 DIAGNOSIS — M795 Residual foreign body in soft tissue: Secondary | ICD-10-CM | POA: Diagnosis not present

## 2023-10-02 DIAGNOSIS — Z1839 Other retained organic fragments: Secondary | ICD-10-CM

## 2023-10-02 NOTE — Progress Notes (Signed)
 Subjective:    Patient ID: Amanda King, female    DOB: August 29, 1940, 83 y.o.   MRN: [id number]  HPI Here due to symptoms after a tick bite--possible retained parts and some redness  Found tick yesterday morning on right foot--while in shower yesterday Had been outside trimming bushes the day before Tried to "mash it out"--started to come out and then used tweezers It then broke off No engorgement with blood Tried alcohol/neosporin  Some itching Some redness at spot   Current Outpatient Medications on File Prior to Visit  Medication Sig Dispense Refill   cetirizine (ZYRTEC) 10 MG tablet Take 10 mg by mouth daily as needed for allergies or rhinitis.      empagliflozin  (JARDIANCE ) 25 MG TABS tablet Take 1 tablet (25 mg total) by mouth daily before breakfast. 90 tablet 3   fenofibrate  (TRICOR ) 145 MG tablet Take 1 tablet (145 mg total) by mouth daily. 90 tablet 3   glucose blood (ONETOUCH VERIO) test strip USE 1 STRIP TO CHECK GLUCOSE 3 times DAILY 300 each 2   HUMALOG  KWIKPEN 100 UNIT/ML KwikPen INJECT 4 UNITS SUBCUTANEOUSLY WITH BREAKFAST AND 6 UNITS WITH SUPPER BEFORE MEALS 15 mL 3   hydrochlorothiazide  (HYDRODIURIL ) 25 MG tablet Take 1/2 (one-half) tablet by mouth once daily 45 tablet 2   imipramine  (TOFRANIL ) 50 MG tablet Take 1 tablet (50 mg total) by mouth at bedtime. 90 tablet 1   Insulin  Pen Needle (B-D UF III MINI PEN NEEDLES) 31G X 5 MM MISC USE 1  THREE TIMES DAILY TO  INJECT  INSULIN  100 each 0   Insulin  Pen Needle (B-D UF III MINI PEN NEEDLES) 31G X 5 MM MISC USE 1 PEN NEEDLE  THREE TIMES DAILY TO INJECT INSULIN  100 each 2   LANTUS  SOLOSTAR 100 UNIT/ML Solostar Pen INJECT 30 UNITS SUBCUTANEOUSLY ONCE DAILY. 15 mL 3   levothyroxine  (SYNTHROID ) 88 MCG tablet Take 1 tablet (88 mcg total) by mouth daily before breakfast. 90 tablet 0   lisinopril  (ZESTRIL ) 20 MG tablet Take 1 tablet by mouth once daily 90 tablet 2   metFORMIN  (GLUCOPHAGE -XR) 750 MG 24 hr tablet Take 1 tablet  (750 mg total) by mouth 2 (two) times daily. 180 tablet 3   methylphenidate  (RITALIN ) 20 MG tablet Take 0.5 tablets (10 mg total) by mouth 3 (three) times daily with meals. 135 tablet 0   OneTouch Delica Lancets 30G MISC USE 1  TO CHECK GLUCOSE ONCE DAILY 100 each 0   polyethylene glycol (MIRALAX  / GLYCOLAX ) packet Take 17 g by mouth daily as needed. 14 each 0   Probiotic Product (ALIGN) 4 MG CAPS Take 1 capsule by mouth daily.     rosuvastatin  (CRESTOR ) 20 MG tablet Take 1 tablet by mouth once daily 90 tablet 2   No current facility-administered medications on file prior to visit.    Allergies  Allergen Reactions   Penicillins Nausea And Vomiting and Rash    Has patient had a PCN reaction causing immediate rash, facial/tongue/throat swelling, SOB or lightheadedness with hypotension: Unknown Has patient had a PCN reaction causing severe rash involving mucus membranes or skin necrosis: Unknown Has patient had a PCN reaction that required hospitalization: Unknown Has patient had a PCN reaction occurring within the last 10 years: No If all of the above answers are "NO", then may proceed with Cephalosporin use.     Past Medical History:  Diagnosis Date   History of ischemic colitis 05/2018   Hyperlipidemia, mixed  Hypersomnia, persistent    Hypertension    Hypothyroidism    endocrinologist--- dr Hubert Madden   Migraine    Narcolepsy with cataplexy    followed by neurologist--- dr dohmeier   Subcutaneous mass    left shoulder   Type 2 diabetes mellitus treated with insulin  [facility])    endocrinologist--- dr a. Hubert Madden   (12-02-2021  per pt check blood sugar daily 2 hours after evening meal)   Wears glasses    Wears hearing aid in both ears     Past Surgical History:  Procedure Laterality Date   APPENDECTOMY  1948   BIOPSY  05/24/2018   Procedure: BIOPSY;  Surgeon: Normie Becton., MD;  Location: [facility] ENDOSCOPY;  Service: Gastroenterology;;   COLONOSCOPY N/A 05/24/2018   Procedure:  COLONOSCOPY;  Surgeon: Normie Becton., MD;  Location: [facility] ENDOSCOPY;  Service: Gastroenterology;  Laterality: N/A;   MASS EXCISION Left 12/06/2021   Procedure: LEFT SHOULDER SUBCUTANEOUS MASS EXCISION;  Surgeon: Junie Olds, MD;  Location: [facility];  Service: General;  Laterality: Left;   POSTERIOR CERVICAL FUSION/FORAMINOTOMY  02/05/1975   C5-C6-C7   VAGINAL HYSTERECTOMY  1971    Family History  Problem Relation Age of Onset   Heart disease Mother        MI   Peripheral vascular disease Mother    Hyperlipidemia Mother    Depression Brother        suicide   Diabetes Brother    Diabetes Brother    Sleep apnea Brother    Sleep apnea Son    Diabetes Son    Heart attack Son    Sudden Cardiac Death Son     Social History   Socioeconomic History   Marital status: Widowed    Spouse name: Not on file   Number of children: 3   Years of education: GED   Highest education level: Not on file  Occupational History   Occupation: works 1 day a week  Tobacco Use   Smoking status: Former    Current packs/day: 0.00    Types: Cigarettes    Start date: 84    Quit date: 1982    Years since quitting: 43.3   Smokeless tobacco: Never  Vaping Use   Vaping status: Never Used  Substance and Sexual Activity   Alcohol use: No    Alcohol/week: 0.0 standard drinks of alcohol   Drug use: Never   Sexual activity: Not on file  Other Topics Concern   Not on file  Social History Narrative   Patient is widowed. Patient has three children.Patient is right-handed.Patient drinks two cups of caffeine daily.  Patient is retired.Patient has a GED.   Social Drivers of Corporate investment banker Strain: Low Risk  (09/29/2023)   Overall Financial Resource Strain (CARDIA)    Difficulty of Paying Living Expenses: Not hard at all  Food Insecurity: No Food Insecurity (09/29/2023)   Hunger Vital Sign    Worried About Running Out of Food in the Last Year: Never true     Ran Out of Food in the Last Year: Never true  Transportation Needs: No Transportation Needs (09/29/2023)   PRAPARE - Administrator, Civil Service (Medical): No    Lack of Transportation (Non-Medical): No  Physical Activity: Inactive (09/29/2023)   Exercise Vital Sign    Days of Exercise per Week: 0 days    Minutes of Exercise per Session: 0 min  Stress: No Stress Concern Present (09/29/2023)  [employer] of Occupational Health - Occupational Stress Questionnaire    Feeling of Stress : Not at all  Social Connections: Moderately Isolated (09/29/2023)   Social Connection and Isolation Panel [NHANES]    Frequency of Communication with Friends and Family: More than three times a week    Frequency of Social Gatherings with Friends and Family: More than three times a week    Attends Religious Services: More than 4 times per year    Active Member of [employer] or Organizations: No    Attends Banker Meetings: Never    Marital Status: Widowed  Intimate Partner Violence: Not At Risk (09/29/2023)   Humiliation, Afraid, Rape, and Kick questionnaire    Fear of Current or Ex-Partner: No    Emotionally Abused: No    Physically Abused: No    Sexually Abused: No   Review of Systems No fever No headache or nausea     Objective:   Physical Exam Constitutional:      Appearance: Normal appearance.  Skin:    Comments: Bite site is just proximal to interphalangeal space between great and second toes Granulation present and possible dark area  Granulation removed with sterile forceps Shallow clean ulcer with nothing in it No inflammation  Neurological:     Mental Status: She is alert.            Assessment & Plan:

## 2023-10-02 NOTE — Assessment & Plan Note (Signed)
 Actually in right foot--but that wasn't an ICD-10 option Granulation tissue removed and no parts left---she may have gotten it all or they came out with the granulation tissue  No inflammation Covered with triple antibiotic and bandaid She will monitor at home

## 2023-10-03 ENCOUNTER — Other Ambulatory Visit: Payer: Self-pay | Admitting: Endocrinology

## 2023-10-03 DIAGNOSIS — E063 Autoimmune thyroiditis: Secondary | ICD-10-CM

## 2023-10-08 ENCOUNTER — Other Ambulatory Visit: Payer: Medicare Other

## 2023-10-08 DIAGNOSIS — E11649 Type 2 diabetes mellitus with hypoglycemia without coma: Secondary | ICD-10-CM | POA: Diagnosis not present

## 2023-10-09 ENCOUNTER — Encounter: Payer: Self-pay | Admitting: Endocrinology

## 2023-10-09 LAB — BASIC METABOLIC PANEL WITH GFR
BUN/Creatinine Ratio: 25 (calc) — ABNORMAL HIGH (ref 6–22)
BUN: 28 mg/dL — ABNORMAL HIGH (ref 7–25)
CO2: 30 mmol/L (ref 20–32)
Calcium: 9.4 mg/dL (ref 8.6–10.4)
Chloride: 99 mmol/L (ref 98–110)
Creat: 1.1 mg/dL — ABNORMAL HIGH (ref 0.60–0.95)
Glucose, Bld: 92 mg/dL (ref 65–99)
Potassium: 4 mmol/L (ref 3.5–5.3)
Sodium: 138 mmol/L (ref 135–146)
eGFR: 50 mL/min/{1.73_m2} — ABNORMAL LOW (ref >=60)

## 2023-10-09 LAB — HEMOGLOBIN A1C
Hgb A1c MFr Bld: 8.3 % — ABNORMAL HIGH (ref <5.7)
Mean Plasma Glucose: 192 mg/dL
eAG (mmol/L): 10.6 mmol/L

## 2023-10-12 ENCOUNTER — Encounter: Payer: Self-pay | Admitting: Endocrinology

## 2023-10-12 ENCOUNTER — Ambulatory Visit: Payer: Medicare Other | Admitting: Endocrinology

## 2023-10-12 VITALS — BP 122/60 | HR 96 | Ht 62.0 in | Wt 167.0 lb

## 2023-10-12 DIAGNOSIS — E063 Autoimmune thyroiditis: Secondary | ICD-10-CM | POA: Diagnosis not present

## 2023-10-12 DIAGNOSIS — E1165 Type 2 diabetes mellitus with hyperglycemia: Secondary | ICD-10-CM | POA: Diagnosis not present

## 2023-10-12 DIAGNOSIS — Z794 Long term (current) use of insulin: Secondary | ICD-10-CM | POA: Diagnosis not present

## 2023-10-12 NOTE — Patient Instructions (Signed)
 Diabetes regimen: Lantus  30 units daily. Humalog  4 units with breakfast and 6 units supper. Metformin  750mg  two times a day. Continue Jardiance .

## 2023-10-12 NOTE — Progress Notes (Signed)
 Outpatient Endocrinology Note Amanda Sharonann Malbrough, MD  10/12/23  Patient's Name: Amanda King    DOB: 12/03/1940    MRN: [id number]                                                    REASON OF VISIT: Follow up of type 2 diabetes mellitus  PCP: Tower, Manley Seeds, MD  HISTORY OF PRESENT ILLNESS:   Amanda MONTEALEGRE is a 83 y.o. old female with past medical history listed below, is here for follow up for type 2 diabetes mellitus /hypothyroidism.   Pertinent Diabetes History: Patient was diagnosed with type 2 diabetes mellitus in 2008.  Patient has uncontrolled type 2 diabetes mellitus.  Chronic Diabetes Complications : Retinopathy: no. Last ophthalmology exam was done on annually, reportedly, following with ophthalmology regularly.  Nephropathy: CKD IIa, on ACE/ARB /lisinopril . Peripheral neuropathy: no Coronary artery disease: no Stroke: no  Relevant comorbidities and cardiovascular risk factors: Obesity: yes Body mass index is 30.54 kg/m.  Hypertension: Yes  Hyperlipidemia : Yes, on statin   Current / Home Diabetic regimen includes: Lantus  30 units in the evening. Humalog  4 units with breakfast and 6 units with supper. Metformin  extended release 750 mg 2 times a day. Jardiance  25 mg daily.  Prior diabetic medications: She had taken glipizide , Januvia  in the past.  Diarrhea with higher dose of metformin  in the past.  Glycemic data:   She has One Touch Verio flex glucometer.  Glucometer download from April 8 to Oct 12, 2023, 28 days reviewed, average blood sugar 140, lowest 96 and highest 203.  Blood sugar in the target range 95%.  She has been taking 1-3 times a day, usually in the morning fasting and around mid day.  Fasting blood sugar 150, 139, 152, 130, 141, 187, 123.  Blood sugar in the afternoon 152, 126, 151, 125.  No hypoglycemia.  Hypoglycemia: Patient has no hypoglycemic episodes. Patient has hypoglycemia awareness.  Factors modifying glucose control: 1.  Diabetic diet  assessment: 3 meals a day.  2.  Staying active or exercising: No formal exercise.  She will be active during daytime with grandkids.  3.  Medication compliance: compliant all of the time.  # Hypothyroidism -Currently taking levothyroxine  88 mcg daily since November 2022.  Thyroid  lab normal.  Interval history  Glucometer data as reviewed above.  Mostly acceptable blood sugar, average blood sugar 140, has been checking 1-3 times a day.  Hemoglobin A1c improved to 8.3%.  Diabetes has been as reviewed and noted above.  No hypoglycemia.  Recent laboratory results reviewed.  Stable renal function.  No other complaints today.  REVIEW OF SYSTEMS As per history of present illness.   PAST MEDICAL HISTORY: Past Medical History:  Diagnosis Date   History of ischemic colitis 05/2018   Hyperlipidemia, mixed    Hypersomnia, persistent    Hypertension    Hypothyroidism    endocrinologist--- dr Hubert Madden   Migraine    Narcolepsy with cataplexy    followed by neurologist--- dr dohmeier   Subcutaneous mass    left shoulder   Type 2 diabetes mellitus treated with insulin  [facility])    endocrinologist--- dr a. kumar   (12-02-2021  per pt check blood sugar daily 2 hours after evening meal)   Wears glasses    Wears hearing aid in both ears  PAST SURGICAL HISTORY: Past Surgical History:  Procedure Laterality Date   APPENDECTOMY  1948   BIOPSY  05/24/2018   Procedure: BIOPSY;  Surgeon: Brice Campi Albino Alu., MD;  Location: [facility] ENDOSCOPY;  Service: Gastroenterology;;   COLONOSCOPY N/A 05/24/2018   Procedure: COLONOSCOPY;  Surgeon: Normie Becton., MD;  Location: [facility] ENDOSCOPY;  Service: Gastroenterology;  Laterality: N/A;   MASS EXCISION Left 12/06/2021   Procedure: LEFT SHOULDER SUBCUTANEOUS MASS EXCISION;  Surgeon: Junie Olds, MD;  Location: [facility];  Service: General;  Laterality: Left;   POSTERIOR CERVICAL FUSION/FORAMINOTOMY  02/05/1975   C5-C6-C7   VAGINAL  HYSTERECTOMY  1971    ALLERGIES: Allergies  Allergen Reactions   Penicillins Nausea And Vomiting and Rash    Has patient had a PCN reaction causing immediate rash, facial/tongue/throat swelling, SOB or lightheadedness with hypotension: Unknown Has patient had a PCN reaction causing severe rash involving mucus membranes or skin necrosis: Unknown Has patient had a PCN reaction that required hospitalization: Unknown Has patient had a PCN reaction occurring within the last 10 years: No If all of the above answers are "NO", then may proceed with Cephalosporin use.     FAMILY HISTORY:  Family History  Problem Relation Age of Onset   Heart disease Mother        MI   Peripheral vascular disease Mother    Hyperlipidemia Mother    Depression Brother        suicide   Diabetes Brother    Diabetes Brother    Sleep apnea Brother    Sleep apnea Son    Diabetes Son    Heart attack Son    Sudden Cardiac Death Son     SOCIAL HISTORY: Social History   Socioeconomic History   Marital status: Widowed    Spouse name: Not on file   Number of children: 3   Years of education: GED   Highest education level: Not on file  Occupational History   Occupation: works 1 day a week  Tobacco Use   Smoking status: Former    Current packs/day: 0.00    Types: Cigarettes    Start date: 52    Quit date: 1982    Years since quitting: 43.3   Smokeless tobacco: Never  Vaping Use   Vaping status: Never Used  Substance and Sexual Activity   Alcohol use: No    Alcohol/week: 0.0 standard drinks of alcohol   Drug use: Never   Sexual activity: Not on file  Other Topics Concern   Not on file  Social History Narrative   Patient is widowed. Patient has three children.Patient is right-handed.Patient drinks two cups of caffeine daily.  Patient is retired.Patient has a GED.   Social Drivers of Corporate investment banker Strain: Low Risk  (09/29/2023)   Overall Financial Resource Strain (CARDIA)     Difficulty of Paying Living Expenses: Not hard at all  Food Insecurity: No Food Insecurity (09/29/2023)   Hunger Vital Sign    Worried About Running Out of Food in the Last Year: Never true    Ran Out of Food in the Last Year: Never true  Transportation Needs: No Transportation Needs (09/29/2023)   PRAPARE - Administrator, Civil Service (Medical): No    Lack of Transportation (Non-Medical): No  Physical Activity: Inactive (09/29/2023)   Exercise Vital Sign    Days of Exercise per Week: 0 days    Minutes of Exercise per Session: 0 min  Stress: No Stress Concern Present (09/29/2023)   [employer] of Occupational Health - Occupational Stress Questionnaire    Feeling of Stress : Not at all  Social Connections: Moderately Isolated (09/29/2023)   Social Connection and Isolation Panel [NHANES]    Frequency of Communication with Friends and Family: More than three times a week    Frequency of Social Gatherings with Friends and Family: More than three times a week    Attends Religious Services: More than 4 times per year    Active Member of [employer] or Organizations: No    Attends Banker Meetings: Never    Marital Status: Widowed    MEDICATIONS:  Current Outpatient Medications  Medication Sig Dispense Refill   cetirizine (ZYRTEC) 10 MG tablet Take 10 mg by mouth daily as needed for allergies or rhinitis.      empagliflozin  (JARDIANCE ) 25 MG TABS tablet Take 1 tablet (25 mg total) by mouth daily before breakfast. 90 tablet 3   fenofibrate  (TRICOR ) 145 MG tablet Take 1 tablet (145 mg total) by mouth daily. 90 tablet 3   glucose blood (ONETOUCH VERIO) test strip USE 1 STRIP TO CHECK GLUCOSE 3 times DAILY 300 each 2   HUMALOG  KWIKPEN 100 UNIT/ML KwikPen INJECT 4 UNITS SUBCUTANEOUSLY WITH BREAKFAST AND 6 UNITS WITH SUPPER BEFORE MEALS 15 mL 3   hydrochlorothiazide  (HYDRODIURIL ) 25 MG tablet Take 1/2 (one-half) tablet by mouth once daily 45 tablet 2   imipramine  (TOFRANIL )  50 MG tablet Take 1 tablet (50 mg total) by mouth at bedtime. 90 tablet 1   Insulin  Pen Needle (B-D UF III MINI PEN NEEDLES) 31G X 5 MM MISC USE 1  THREE TIMES DAILY TO  INJECT  INSULIN  100 each 0   Insulin  Pen Needle (B-D UF III MINI PEN NEEDLES) 31G X 5 MM MISC USE 1 PEN NEEDLE  THREE TIMES DAILY TO INJECT INSULIN  100 each 2   LANTUS  SOLOSTAR 100 UNIT/ML Solostar Pen INJECT 30 UNITS SUBCUTANEOUSLY ONCE DAILY. 15 mL 3   levothyroxine  (SYNTHROID ) 88 MCG tablet TAKE 1 TABLET BY MOUTH DAILY BEFORE BREAKFAST 90 tablet 3   lisinopril  (ZESTRIL ) 20 MG tablet Take 1 tablet by mouth once daily 90 tablet 2   metFORMIN  (GLUCOPHAGE -XR) 750 MG 24 hr tablet Take 1 tablet (750 mg total) by mouth 2 (two) times daily. 180 tablet 3   methylphenidate  (RITALIN ) 20 MG tablet Take 0.5 tablets (10 mg total) by mouth 3 (three) times daily with meals. 135 tablet 0   OneTouch Delica Lancets 30G MISC USE 1  TO CHECK GLUCOSE ONCE DAILY 100 each 0   polyethylene glycol (MIRALAX  / GLYCOLAX ) packet Take 17 g by mouth daily as needed. 14 each 0   Probiotic Product (ALIGN) 4 MG CAPS Take 1 capsule by mouth daily.     rosuvastatin  (CRESTOR ) 20 MG tablet Take 1 tablet by mouth once daily 90 tablet 2   No current facility-administered medications for this visit.    PHYSICAL EXAM: Vitals:   10/12/23 1328  BP: 122/60  Pulse: 96  SpO2: 93%  Weight: 167 lb (75.8 kg)  Height: 5\' 2"  (1.575 m)    Body mass index is 30.54 kg/m.  Wt Readings from Last 3 Encounters:  10/12/23 167 lb (75.8 kg)  10/02/23 170 lb (77.1 kg)  09/29/23 166 lb (75.3 kg)    General: Well developed, well nourished female in no apparent distress.  HEENT: AT/[place], no external lesions.  Eyes: Conjunctiva clear and no icterus. Neck:  Neck supple  Lungs: Respirations not labored Neurologic: Alert, oriented, normal speech Extremities / Skin: Dry.  Psychiatric: Does not appear depressed or anxious  Diabetic Foot Exam - Simple   No data filed    LABS  Reviewed Lab Results  Component Value Date   HGBA1C 8.3 (H) 10/08/2023   HGBA1C 9.4 (H) 07/09/2023   HGBA1C 8.2 (A) 04/14/2023   Lab Results  Component Value Date   FRUCTOSAMINE 309 (H) 06/18/2021   FRUCTOSAMINE 267 11/22/2018   FRUCTOSAMINE 349 (H) 08/18/2018   Lab Results  Component Value Date   CHOL 158 03/05/2023   HDL 56.20 03/05/2023   LDLCALC 68 03/05/2023   LDLDIRECT 110.0 02/12/2022   TRIG 173.0 (H) 03/05/2023   CHOLHDL 3 03/05/2023   Lab Results  Component Value Date   MICRALBCREAT 6 07/09/2023   MICRALBCREAT 1.6 11/12/2021   Lab Results  Component Value Date   CREATININE 1.10 (H) 10/08/2023   Lab Results  Component Value Date   GFR 47.04 (L) 03/05/2023    ASSESSMENT / PLAN  1. Uncontrolled type 2 diabetes mellitus with hyperglycemia, with long-term current use of insulin  (HCC)   2. Acquired autoimmune hypothyroidism     Diabetes Mellitus type 2, complicated by CKD. - Diabetic status / severity: Uncontrolled.  Improving.  Lab Results  Component Value Date   HGBA1C 8.3 (H) 10/08/2023    - Hemoglobin A1c goal : <7.5%  Glucometer data mostly acceptable with average blood sugar 140.  - Medications: See below.  No change.  I) continue lantus  from 28 to 30 units daily. II) continue Humalog  4 units with breakfast and 6 units supper. III) continue metformin  750mg  two times a day. IV) continue Jardiance  25 mg daily.  - Home glucose testing: With meals and bedtime 3-4 times a day.    - Discussed/ Gave Hypoglycemia treatment plan.  # Consult : not required at this time.   # Annual urine for microalbuminuria/ creatinine ratio, no microalbuminuria currently, continue ACE/ARB /lisinopril .   Last  Lab Results  Component Value Date   MICRALBCREAT 6 07/09/2023    # Foot check nightly / neuropathy.  # Annual dilated diabetic eye exams.   - Diet: Make healthy diabetic food choices - Life style / activity / exercise: Discussed.  2. Blood pressure   -  BP Readings from Last 1 Encounters:  10/12/23 122/60    - Control is in target.  - No change in current plans.  3. Lipid status / Hyperlipidemia - Last  Lab Results  Component Value Date   LDLCALC 68 03/05/2023   - Continue rosuvastatin  20 mg daily.  # Hypothyroidism -Continue levothyroxine  88 mcg daily.  Annual thyroid  lab.  Diagnoses and all orders for this visit:  Uncontrolled type 2 diabetes mellitus with hyperglycemia, with long-term current use of insulin  (HCC)  Acquired autoimmune hypothyroidism    DISPOSITION Follow up in clinic in 3 months suggested.  Labs on the same day of the visit.  All questions answered and patient verbalized understanding of the plan.  Amanda Sheneka Schrom, MD [facility] Endocrinology [facility] [address] [place], [address] [place], [place] [address] Phone # [phone]  At least part of this note was generated using voice recognition software. Inadvertent word errors may have occurred, which were not recognized during the proofreading process.

## 2023-10-16 ENCOUNTER — Other Ambulatory Visit: Payer: Self-pay | Admitting: Adult Health

## 2023-10-16 DIAGNOSIS — G4710 Hypersomnia, unspecified: Secondary | ICD-10-CM

## 2023-10-16 MED ORDER — METHYLPHENIDATE HCL 20 MG PO TABS: 10.0000 mg | ORAL_TABLET | Freq: Three times a day (TID) | ORAL | 0 refills | Status: DC

## 2023-10-16 NOTE — Telephone Encounter (Signed)
Pt is requesting a refill for methylphenidate (RITALIN) 20 MG tablet.  Pharmacy: [facility] #[id number]   

## 2023-10-16 NOTE — Telephone Encounter (Signed)
 Requested Prescriptions   Pending Prescriptions Disp Refills   methylphenidate  (RITALIN ) 20 MG tablet 135 tablet 0    Sig: Take 0.5 tablets (10 mg total) by mouth 3 (three) times daily with meals.   Last seen 07/13/23, next appt 01/11/24 Dispenses   Dispensed Days Supply Quantity Provider Pharmacy  METHYLPHENIDATE  20MG  TABLETS 07/08/2023 60 90 each Dohmeier, Raoul Byes, MD [facility] #...  METHYLPHENIDATE  20MG  TABLETS 01/05/2023 90 135 each Dohmeier, Raoul Byes, MD [facility] #...    Routing to [employer] as I didn't see a work in provider listed for today and she has previously seen the pt

## 2023-10-20 ENCOUNTER — Telehealth: Payer: Self-pay | Admitting: Adult Health

## 2023-10-20 ENCOUNTER — Other Ambulatory Visit (HOSPITAL_COMMUNITY): Payer: Self-pay

## 2023-10-20 ENCOUNTER — Telehealth: Payer: Self-pay

## 2023-10-20 NOTE — Telephone Encounter (Signed)
 I called pt and let her know that prescription was done 10-16-2023 for the methylphenidate , but this requires a PA.  Pt aware .  I relayed that [facility]  is option as well does not go thru insurance. She will let us  know as she only has 2 days worth and it could take longer to determination.  She verbalized understanding.

## 2023-10-20 NOTE — Telephone Encounter (Signed)
 PATIENT requested refill on 5/9 and pharmacy advised the pre authorization is needed. Patient usually gets a letter from [employer] but hasn't received anything. She has 2 days left of meds. Please cal the patient [phone] if there is an issue.

## 2023-10-20 NOTE — Telephone Encounter (Addendum)
 Pharmacy Patient Advocate Encounter   Received notification from Physician's Office that prior authorization for Methylphenidate  HCl 20MG  tablets is required/requested.   Insurance verification completed.   The patient is insured through [facility] .   Per test claim: PA required; PA submitted to above mentioned insurance via CoverMyMeds Key/confirmation #/EOC [id number] Status is pending

## 2023-10-20 NOTE — Telephone Encounter (Signed)
 Contacted pharmacy and PA is needed.  Pt has 2 days left.

## 2023-10-21 ENCOUNTER — Other Ambulatory Visit (HOSPITAL_COMMUNITY): Payer: Self-pay

## 2023-10-21 NOTE — Telephone Encounter (Signed)
 Thanks!  I notified the patient.

## 2023-10-21 NOTE — Telephone Encounter (Signed)
 Called pt and let her know of approval for 1 yr. Estimated co-pay is around $18. She can call pharmacy to have it filled. I also reached out to the pharmacy and I let them know too.

## 2023-10-21 NOTE — Telephone Encounter (Signed)
 Pharmacy Patient Advocate Encounter  Received notification from [facility] that Prior Authorization for Methylphenidate  HCl 20MG  tablets has been APPROVED from 10/20/2023 to 10/19/2024. Ran test claim, Copay is $18.89. This test claim was processed through [facility]- copay amounts may vary at other pharmacies due to pharmacy/plan contracts, or as the patient moves through the different stages of their insurance plan.   PA #/Case ID/Reference #: PA Case ID #: [id number]

## 2023-10-30 ENCOUNTER — Other Ambulatory Visit: Payer: Self-pay | Admitting: Family Medicine

## 2023-11-16 ENCOUNTER — Other Ambulatory Visit: Payer: Self-pay | Admitting: Endocrinology

## 2023-11-16 DIAGNOSIS — E1165 Type 2 diabetes mellitus with hyperglycemia: Secondary | ICD-10-CM

## 2023-12-03 DIAGNOSIS — G4733 Obstructive sleep apnea (adult) (pediatric): Secondary | ICD-10-CM | POA: Diagnosis not present

## 2023-12-08 ENCOUNTER — Other Ambulatory Visit: Payer: Self-pay | Admitting: Adult Health

## 2023-12-08 DIAGNOSIS — G4710 Hypersomnia, unspecified: Secondary | ICD-10-CM

## 2023-12-08 MED ORDER — METHYLPHENIDATE HCL 20 MG PO TABS: 10.0000 mg | ORAL_TABLET | Freq: Three times a day (TID) | ORAL | 0 refills | Status: DC

## 2023-12-08 NOTE — Telephone Encounter (Signed)
 Patient request refill for methylphenidate  (RITALIN ) 20 MG tablet send to [facility] #[id number]

## 2023-12-14 ENCOUNTER — Other Ambulatory Visit: Payer: Self-pay | Admitting: Family Medicine

## 2023-12-21 ENCOUNTER — Ambulatory Visit: Payer: Self-pay

## 2023-12-21 NOTE — Telephone Encounter (Signed)
 FYI Only or Action Required?: FYI only for provider.  Patient was last seen in primary care on 10/02/2023 by Amanda Charlie FERNS, MD.  Called Nurse Triage reporting Foot Swelling.  Symptoms began several weeks ago.  Interventions attempted: Rest, hydration, or home remedies.  Symptoms are: gradually worsening.  Triage Disposition: See PCP When Office is Open (Within 3 Days)  Patient/caregiver understands and will follow disposition?: Yes        Copied from CRM [id number]. Topic: Clinical - Red Word Triage >> Dec 21, 2023  8:49 AM Suzen RAMAN wrote: Red Word that prompted transfer to Nurse Triage: Feet Swelling Reason for Disposition  [1] MILD swelling of both ankles (e.g., ankle joints look swollen; or bilateral mild pedal edema) AND [2] new-onset or getting worse  (Exceptions: Caused by hot weather, already seen by doctor or NP/PA for this.)  Answer Assessment - Initial Assessment Questions 1. LOCATION: Which ankle is swollen? Where is the swelling?     Bilateral, sometimes just left 2. ONSET: When did the swelling start?     X 2 weeks  3. SWELLING: How bad is the swelling? Or, How large is it? (e.g., mild, moderate, severe; size of localized swelling)      Mild 4. PAIN: Is there any pain? If Yes, ask: How bad is it? (Scale 0-10; or none, mild, moderate, severe)     None 5. CAUSE: What do you think caused the ankle swelling?     Unknown 6. OTHER SYMPTOMS: Do you have any other symptoms? (e.g., fever, chest pain, difficulty breathing, calf pain)     None  Protocols used: Ankle Swelling-A-AH

## 2023-12-21 NOTE — Telephone Encounter (Signed)
 Aware, will watch for correspondence Thanks for seeing her

## 2023-12-22 ENCOUNTER — Ambulatory Visit: Admitting: Family

## 2023-12-24 ENCOUNTER — Encounter: Payer: Self-pay | Admitting: Family Medicine

## 2023-12-24 ENCOUNTER — Ambulatory Visit (INDEPENDENT_AMBULATORY_CARE_PROVIDER_SITE_OTHER): Admitting: Family Medicine

## 2023-12-24 VITALS — BP 136/64 | HR 96 | Temp 97.7°F | Ht 62.0 in | Wt 167.1 lb

## 2023-12-24 DIAGNOSIS — R600 Localized edema: Secondary | ICD-10-CM | POA: Insufficient documentation

## 2023-12-24 DIAGNOSIS — I10 Essential (primary) hypertension: Secondary | ICD-10-CM | POA: Diagnosis not present

## 2023-12-24 NOTE — Patient Instructions (Signed)
 Continue to watch sodium intake   Elevate feet when you sit   Consider wearing compression socks to the knee (or higher)   If you develop shortness of breath let me know  If you develop worse swelling let me know

## 2023-12-24 NOTE — Assessment & Plan Note (Signed)
 Mild/not pitting and worse on left (side with severe knee oa) No cardiac signs and symptoms Good habits  Reassuring exam Suspect venous insuff  Handouts given  Encouraged to elevate feet when sitting Avoid sodium /processed foods  Compression garments prn -can start with over the counter   Most recent labs reviewed   Call back and Er precautions noted in detail today

## 2023-12-24 NOTE — Assessment & Plan Note (Signed)
 bp in fair control at this time  BP Readings from Last 1 Encounters:  12/24/23 136/64   No changes needed Most recent labs reviewed  Disc lifstyle change with low sodium diet and exercise  Plan to continue lisinopril  20 mg daily  hctz 12.5 mg daily   Last GFR 50 in may

## 2023-12-24 NOTE — Progress Notes (Signed)
 Subjective:    Patient ID: Amanda King, female    DOB: Dec 30, 1940, 83 y.o.   MRN: [id number]  HPI  Wt Readings from Last 3 Encounters:  12/24/23 167 lb 2 oz (75.8 kg)  10/12/23 167 lb (75.8 kg)  10/02/23 170 lb (77.1 kg)   30.57 kg/m  Vitals:   12/24/23 1209  BP: 136/64  Pulse: 96  Temp: 97.7 F (36.5 C)  SpO2: 98%   Pt presents for c/o Ankle swelling    Feet and ankle look red  Worse on the left (where her bad arthritis is)  Some swelling  Does not add salt to food at all   Eating healthy  She checks labels for sodium and cholesterol   Eats out once per month  Is good about it   No pain  No tingling or burning   Elevates feet   No shortness of breath  No exercise intolerance  No PND  Sleeps great with cpap  No orthopnea       HTN bp is stable today  No cp or palpitations or headaches or edema  No side effects to medicines  BP Readings from Last 3 Encounters:  12/24/23 136/64  10/12/23 122/60  10/02/23 118/76    Pulse Readings from Last 3 Encounters:  12/24/23 96  10/12/23 96  10/02/23 94    Lisinopril  20 mg daily  Hydrochlorothiazide  12.5 mg daily   Lab Results  Component Value Date   NA 138 10/08/2023   K 4.0 10/08/2023   CO2 30 10/08/2023   GLUCOSE 92 10/08/2023   BUN 28 (H) 10/08/2023   CREATININE 1.10 (H) 10/08/2023   CALCIUM  9.4 10/08/2023   GFR 47.04 (L) 03/05/2023   EGFR 50 (L) 10/08/2023   GFRNONAA >60 05/24/2018     Lab Results  Component Value Date   ALT 23 03/05/2023   AST 20 03/05/2023   ALKPHOS 64 03/05/2023   BILITOT 0.3 03/05/2023   Lab Results  Component Value Date   WBC 7.4 03/05/2023   HGB 13.7 03/05/2023   HCT 42.1 03/05/2023   MCV 89.9 03/05/2023   PLT 293.0 03/05/2023   Sees endo for dm Last A1c 8.4 Thinks it will be improved   Hypothyroid Lab Results  Component Value Date   TSH 3.51 03/05/2023   88 mcg levothyroxine     Patient Active Problem List   Diagnosis Date Noted   Pedal  edema 12/24/2023   Retained tick parts of thigh 10/02/2023   Arthritis of left knee 06/30/2023   OSA on CPAP 01/05/2023   At risk for obstructive sleep apnea 08/05/2022   Allergic rhinitis 04/21/2022   Encounter for screening mammogram for breast cancer 01/03/2022   Grief reaction 2021/03/03   Family history of sudden cardiac death 2021/03/03   Localized swelling, mass and lump, neck 2021-03-03   History of ischemic colitis    Constipation    Routine general medical examination at a health care facility 01/31/2016   Narcolepsy with cataplexy 10/31/2014   Encounter for Medicare annual wellness exam 09/22/2013   Hypersomnia, persistent 07/15/2013   Colon cancer screening 12/31/2011   ANXIETY 05/06/2010   DERMATOPHYTOSIS OF NAIL 12/21/2008   Hypothyroidism 09/02/2006   Diabetes type 2, uncontrolled 09/02/2006   Hyperlipidemia associated with type 2 diabetes mellitus (HCC) 09/02/2006   NARCOLEPSY W/CATAPLEXY 09/02/2006   Essential hypertension 09/02/2006   PNEUMONIA, HX OF 09/02/2006   Past Medical History:  Diagnosis Date   History of ischemic colitis 05/2018  Hyperlipidemia, mixed    Hypersomnia, persistent    Hypertension    Hypothyroidism    endocrinologist--- dr von   Migraine    Narcolepsy with cataplexy    followed by neurologist--- dr dohmeier   Subcutaneous mass    left shoulder   Type 2 diabetes mellitus treated with insulin  [facility])    endocrinologist--- dr a. von   (12-02-2021  per pt check blood sugar daily 2 hours after evening meal)   Wears glasses    Wears hearing aid in both ears    Past Surgical History:  Procedure Laterality Date   APPENDECTOMY  1948   BIOPSY  05/24/2018   Procedure: BIOPSY;  Surgeon: Wilhelmenia Aloha Raddle., MD;  Location: [facility] ENDOSCOPY;  Service: Gastroenterology;;   COLONOSCOPY N/A 05/24/2018   Procedure: COLONOSCOPY;  Surgeon: Wilhelmenia Aloha Raddle., MD;  Location: [facility] ENDOSCOPY;  Service: Gastroenterology;  Laterality: N/A;    MASS EXCISION Left 12/06/2021   Procedure: LEFT SHOULDER SUBCUTANEOUS MASS EXCISION;  Surgeon: Lyndel Deward PARAS, MD;  Location: [facility];  Service: General;  Laterality: Left;   POSTERIOR CERVICAL FUSION/FORAMINOTOMY  02/05/1975   C5-C6-C7   VAGINAL HYSTERECTOMY  1971   Social History   Tobacco Use   Smoking status: Former    Current packs/day: 0.00    Types: Cigarettes    Start date: 17    Quit date: 1982    Years since quitting: 43.5   Smokeless tobacco: Never  Vaping Use   Vaping status: Never Used  Substance Use Topics   Alcohol use: No    Alcohol/week: 0.0 standard drinks of alcohol   Drug use: Never   Family History  Problem Relation Age of Onset   Heart disease Mother        MI   Peripheral vascular disease Mother    Hyperlipidemia Mother    Depression Brother        suicide   Diabetes Brother    Diabetes Brother    Sleep apnea Brother    Sleep apnea Son    Diabetes Son    Heart attack Son    Sudden Cardiac Death Son    Allergies  Allergen Reactions   Penicillins Nausea And Vomiting and Rash    Has patient had a PCN reaction causing immediate rash, facial/tongue/throat swelling, SOB or lightheadedness with hypotension: Unknown Has patient had a PCN reaction causing severe rash involving mucus membranes or skin necrosis: Unknown Has patient had a PCN reaction that required hospitalization: Unknown Has patient had a PCN reaction occurring within the last 10 years: No If all of the above answers are NO, then may proceed with Cephalosporin use.    Current Outpatient Medications on File Prior to Visit  Medication Sig Dispense Refill   cetirizine (ZYRTEC) 10 MG tablet Take 10 mg by mouth daily as needed for allergies or rhinitis.      empagliflozin  (JARDIANCE ) 25 MG TABS tablet Take 1 tablet (25 mg total) by mouth daily before breakfast. 90 tablet 3   fenofibrate  (TRICOR ) 145 MG tablet Take 1 tablet (145 mg total) by mouth daily. 90  tablet 3   glucose blood (ONETOUCH VERIO) test strip USE 1 STRIP TO CHECK GLUCOSE 3 times DAILY 300 each 2   HUMALOG  KWIKPEN 100 UNIT/ML KwikPen INJECT 4 UNITS SUBCUTANEOUSLY WITH BREAKFAST AND 6 UNITS WITH SUPPER BEFORE MEALS 15 mL 3   hydrochlorothiazide  (HYDRODIURIL ) 25 MG tablet Take 1/2 (one-half) tablet by mouth once daily 45 tablet 0  imipramine  (TOFRANIL ) 50 MG tablet Take 1 tablet (50 mg total) by mouth at bedtime. 90 tablet 1   Insulin  Pen Needle (B-D UF III MINI PEN NEEDLES) 31G X 5 MM MISC USE 1  THREE TIMES DAILY TO  INJECT  INSULIN  100 each 0   Insulin  Pen Needle (B-D UF III MINI PEN NEEDLES) 31G X 5 MM MISC USE 1 PEN NEEDLE  THREE TIMES DAILY TO INJECT INSULIN  100 each 2   LANTUS  SOLOSTAR 100 UNIT/ML Solostar Pen INJECT 28 UNITS SUBCUTANEOUSLY ONCE DAILY 15 mL 4   levothyroxine  (SYNTHROID ) 88 MCG tablet TAKE 1 TABLET BY MOUTH DAILY BEFORE BREAKFAST 90 tablet 3   lisinopril  (ZESTRIL ) 20 MG tablet Take 1 tablet by mouth once daily 90 tablet 0   metFORMIN  (GLUCOPHAGE -XR) 750 MG 24 hr tablet Take 1 tablet (750 mg total) by mouth 2 (two) times daily. 180 tablet 3   methylphenidate  (RITALIN ) 20 MG tablet Take 0.5 tablets (10 mg total) by mouth 3 (three) times daily with meals. 45 tablet 0   OneTouch Delica Lancets 30G MISC USE 1  TO CHECK GLUCOSE ONCE DAILY 100 each 0   polyethylene glycol (MIRALAX  / GLYCOLAX ) packet Take 17 g by mouth daily as needed. 14 each 0   Probiotic Product (ALIGN) 4 MG CAPS Take 1 capsule by mouth daily.     rosuvastatin  (CRESTOR ) 20 MG tablet Take 1 tablet by mouth once daily 90 tablet 2   No current facility-administered medications on file prior to visit.    Review of Systems  Constitutional:  Negative for activity change, appetite change, fatigue, fever and unexpected weight change.  HENT:  Negative for congestion, ear pain, rhinorrhea, sinus pressure and sore throat.   Eyes:  Negative for pain, redness and visual disturbance.  Respiratory:  Negative  for cough, shortness of breath and wheezing.   Cardiovascular:  Positive for leg swelling. Negative for chest pain and palpitations.  Gastrointestinal:  Negative for abdominal pain, blood in stool, constipation and diarrhea.  Endocrine: Negative for polydipsia and polyuria.  Genitourinary:  Negative for dysuria, frequency and urgency.  Musculoskeletal:  Negative for arthralgias, back pain and myalgias.  Skin:  Negative for pallor and rash.  Allergic/Immunologic: Negative for environmental allergies.  Neurological:  Negative for dizziness, syncope and headaches.  Hematological:  Negative for adenopathy. Does not bruise/bleed easily.  Psychiatric/Behavioral:  Negative for decreased concentration and dysphoric mood. The patient is not nervous/anxious.        Objective:   Physical Exam Constitutional:      General: She is not in acute distress.    Appearance: Normal appearance. She is well-developed. She is obese. She is not ill-appearing or diaphoretic.  HENT:     Head: Normocephalic and atraumatic.  Eyes:     Conjunctiva/sclera: Conjunctivae normal.     Pupils: Pupils are equal, round, and reactive to light.  Neck:     Thyroid : No thyromegaly.     Vascular: No carotid bruit or JVD.  Cardiovascular:     Rate and Rhythm: Normal rate and regular rhythm.     Pulses: Normal pulses.     Heart sounds: Normal heart sounds.     No gallop.  Pulmonary:     Effort: Pulmonary effort is normal. No respiratory distress.     Breath sounds: Normal breath sounds. No stridor. No wheezing, rhonchi or rales.     Comments: No crackles  Abdominal:     General: There is no distension or abdominal bruit.  Palpations: Abdomen is soft.     Tenderness: There is no abdominal tenderness.     Comments: No swelling  No hsm  Musculoskeletal:     Cervical back: Normal range of motion and neck supple.     Right lower leg: Edema present.     Left lower leg: Edema present.     Comments: Trace non pitting  pedal edema worse on left  No tenderness of legs or feet  No heart or redness today  Spider varicosities noted Good pedal pulses   Limited rom left knee baseline        Lymphadenopathy:     Cervical: No cervical adenopathy.  Skin:    General: Skin is warm and dry.     Coloration: Skin is not pale.     Findings: No rash.  Neurological:     Mental Status: She is alert.     Coordination: Coordination normal.     Deep Tendon Reflexes: Reflexes are normal and symmetric. Reflexes normal.  Psychiatric:        Mood and Affect: Mood normal.           Assessment & Plan:   Problem List Items Addressed This Visit       Cardiovascular and Mediastinum   Essential hypertension   bp in fair control at this time  BP Readings from Last 1 Encounters:  12/24/23 136/64   No changes needed Most recent labs reviewed  Disc lifstyle change with low sodium diet and exercise  Plan to continue lisinopril  20 mg daily  hctz 12.5 mg daily   Last GFR 50 in may         Other   Pedal edema - Primary   Mild/not pitting and worse on left (side with severe knee oa) No cardiac signs and symptoms Good habits  Reassuring exam Suspect venous insuff  Handouts given  Encouraged to elevate feet when sitting Avoid sodium /processed foods  Compression garments prn -can start with over the counter   Most recent labs reviewed   Call back and Er precautions noted in detail today

## 2023-12-25 ENCOUNTER — Telehealth: Payer: Self-pay

## 2023-12-25 NOTE — Telephone Encounter (Signed)
 Form placed in your inbox

## 2023-12-25 NOTE — Telephone Encounter (Signed)
 Copied from CRM [id number]. Topic: Clinical - Medical Advice >> Dec 25, 2023 11:58 AM Laymon HERO wrote: Reason for CRM: Patient was seen yesterday ad forgot to mention to Dr Randeen about getting a handicap placard for her car, wanting to know how she can go about getting that. Please contact daughter  Maegen Wigle (daughter) ph# [phone]

## 2023-12-25 NOTE — Telephone Encounter (Signed)
 See prev note, if okay I will place form in your inbox, please advise

## 2023-12-25 NOTE — Telephone Encounter (Signed)
That is fine please do

## 2023-12-29 NOTE — Telephone Encounter (Signed)
 PCP filled out form and pt's daughter notified form ready for pick up

## 2023-12-29 NOTE — Telephone Encounter (Signed)
 Patient picked up.

## 2024-01-11 ENCOUNTER — Ambulatory Visit: Payer: Medicare Other | Admitting: Adult Health

## 2024-01-11 VITALS — BP 130/80 | HR 100 | Ht 62.0 in | Wt 169.0 lb

## 2024-01-11 DIAGNOSIS — G4733 Obstructive sleep apnea (adult) (pediatric): Secondary | ICD-10-CM | POA: Diagnosis not present

## 2024-01-11 DIAGNOSIS — G47411 Narcolepsy with cataplexy: Secondary | ICD-10-CM | POA: Diagnosis not present

## 2024-01-11 NOTE — Progress Notes (Signed)
 PATIENT: Amanda King DOB: May 06, 1941  REASON FOR VISIT: follow up HISTORY FROM: patient PRIMARY NEUROLOGIST: Dr. Chalice  Chief Complaint  Patient presents with   Follow-up    Rm 4,  pt alone,  states that overall she is doing well.  She continues using the ritalin  and tofranil . Gets these at [facility].      HISTORY OF PRESENT ILLNESS: Today 01/11/24:  Amanda King is a 83 y.o. female with a history of narcolepsy and obstructive sleep apnea on CPAP. Returns today for follow-up.  She reports that the CPAP is working well for her.  Has a nasal mask.  She remains on Ritalin  3 times a day and Tofranil .  She denies any cataplectic events.  She states the Ritalin  works well to keep her awake.  Her blood pressure is slightly elevated today.  She states that she did take Ritalin  before she came here.  But she also notes that anytime she comes to the doctor her blood pressure is elevated.  She states that she periodically checks it at home and is in normal range.         07/13/23: Amanda King is a 83 y.o. female who has been followed in this office for Narcolepsy and OSA on CPAP. Returns today for follow-up.  She reports that the CPAP is working well.  She denies any new issues.  She continues on Tofranil  and Ritalin  for narcolepsy.  She states that the combination works well.  She states that if she laughs too hard or gets upset she may notice weakness in the knees.  Her download is below    HISTORY:  Amanda King is a 83 y.o. female patient who is here for revisit 01/05/2023 for  her newly diagnosed OSA on CPAP. Amanda King  Chief concern according to patient :  Mrs. Amanda King has been smitten by her CPAP machine.   She states that she can now sleep 7  or even  8 hours and she feels so much better.   She has gotten used to it with a nasal mask that covers her nose and is compliantly using CPAP with a great decrease in daytime fatigue and sleepiness.   Her compliance has been 100% for  days and hours with an average of 5 hours 50 minutes.  She is using an AutoSet at the suggested setting between 5 and 16 with 3 cm water expiratory relief, her residual AHI is 1.9 down from 18.6 in her home sleep test, she had REM exacerbated apnea so this has truly helped her her 95th percentile pressure is 13.7 cm water she may even benefit in the future from a centimeter more pressure to choose from.  She tries to avoid sleeping in supine.  She seems to get a fairly good air seal as her air leak is only 13.2 L a minute at the 95th percentile.   The patient endorsed only 2 out of 15 points at the geriatric depression scale, 10 out of 24 on the Epworth Sleepiness Scale and 10 out of 63 on the fatigue severity scale.      REVIEW OF SYSTEMS: Out of a complete 14 system review of symptoms, the patient complains only of the following symptoms, and all other reviewed systems are negative.  ESS 5  ALLERGIES: Allergies  Allergen Reactions   Penicillins Nausea And Vomiting and Rash    Has patient had a PCN reaction causing immediate rash, facial/tongue/throat swelling, SOB or lightheadedness with hypotension:  Unknown Has patient had a PCN reaction causing severe rash involving mucus membranes or skin necrosis: Unknown Has patient had a PCN reaction that required hospitalization: Unknown Has patient had a PCN reaction occurring within the last 10 years: No If all of the above answers are NO, then may proceed with Cephalosporin use.     HOME MEDICATIONS: Outpatient Medications Prior to Visit  Medication Sig Dispense Refill   cetirizine (ZYRTEC) 10 MG tablet Take 10 mg by mouth daily as needed for allergies or rhinitis.      empagliflozin  (JARDIANCE ) 25 MG TABS tablet Take 1 tablet (25 mg total) by mouth daily before breakfast. 90 tablet 3   fenofibrate  (TRICOR ) 145 MG tablet Take 1 tablet (145 mg total) by mouth daily. 90 tablet 3   glucose blood (ONETOUCH VERIO) test strip USE 1 STRIP TO CHECK  GLUCOSE 3 times DAILY 300 each 2   HUMALOG  KWIKPEN 100 UNIT/ML KwikPen INJECT 4 UNITS SUBCUTANEOUSLY WITH BREAKFAST AND 6 UNITS WITH SUPPER BEFORE MEALS 15 mL 3   hydrochlorothiazide  (HYDRODIURIL ) 25 MG tablet Take 1/2 (one-half) tablet by mouth once daily 45 tablet 0   imipramine  (TOFRANIL ) 50 MG tablet Take 1 tablet (50 mg total) by mouth at bedtime. 90 tablet 1   LANTUS  SOLOSTAR 100 UNIT/ML Solostar Pen INJECT 28 UNITS SUBCUTANEOUSLY ONCE DAILY 15 mL 4   levothyroxine  (SYNTHROID ) 88 MCG tablet TAKE 1 TABLET BY MOUTH DAILY BEFORE BREAKFAST 90 tablet 3   lisinopril  (ZESTRIL ) 20 MG tablet Take 1 tablet by mouth once daily 90 tablet 0   metFORMIN  (GLUCOPHAGE -XR) 750 MG 24 hr tablet Take 1 tablet (750 mg total) by mouth 2 (two) times daily. 180 tablet 3   methylphenidate  (RITALIN ) 20 MG tablet Take 0.5 tablets (10 mg total) by mouth 3 (three) times daily with meals. 45 tablet 0   OneTouch Delica Lancets 30G MISC USE 1  TO CHECK GLUCOSE ONCE DAILY 100 each 0   polyethylene glycol (MIRALAX  / GLYCOLAX ) packet Take 17 g by mouth daily as needed. 14 each 0   Probiotic Product (ALIGN) 4 MG CAPS Take 1 capsule by mouth daily.     rosuvastatin  (CRESTOR ) 20 MG tablet Take 1 tablet by mouth once daily 90 tablet 2   Insulin  Pen Needle (B-D UF III MINI PEN NEEDLES) 31G X 5 MM MISC USE 1  THREE TIMES DAILY TO  INJECT  INSULIN  100 each 0   Insulin  Pen Needle (B-D UF III MINI PEN NEEDLES) 31G X 5 MM MISC USE 1 PEN NEEDLE  THREE TIMES DAILY TO INJECT INSULIN  100 each 2   No facility-administered medications prior to visit.    PAST MEDICAL HISTORY: Past Medical History:  Diagnosis Date   History of ischemic colitis 05/2018   Hyperlipidemia, mixed    Hypersomnia, persistent    Hypertension    Hypothyroidism    endocrinologist--- dr Amanda King   Migraine    Narcolepsy with cataplexy    followed by neurologist--- dr Amanda King   Subcutaneous mass    left shoulder   Type 2 diabetes mellitus treated with insulin   [facility])    endocrinologist--- dr Amanda King   (12-02-2021  per pt check blood sugar daily 2 hours after evening meal)   Wears glasses    Wears hearing aid in both ears     PAST SURGICAL HISTORY: Past Surgical History:  Procedure Laterality Date   APPENDECTOMY  1948   BIOPSY  05/24/2018   Procedure: BIOPSY;  Surgeon: Wilhelmenia Aloha Raddle.,  MD;  Location: [facility];  Service: Gastroenterology;;   COLONOSCOPY N/A 05/24/2018   Procedure: COLONOSCOPY;  Surgeon: Wilhelmenia Aloha Raddle., MD;  Location: [facility] END[facility]COPY;  Service: Gastroenterology;  Laterality: N/A;   MASS EXCISION Left 12/06/2021   Procedure: LEFT SHOULDER SUBCUTANEOUS MASS EXCISION;  Surgeon: Lyndel Deward PARAS, MD;  Location: [facility];  Service: General;  Laterality: Left;   POSTERIOR CERVICAL FUSION/FORAMINOTOMY  02/05/1975   C5-C6-C7   VAGINAL HYSTERECTOMY  1971    FAMILY HISTORY: Family History  Problem Relation Age of Onset   Heart disease Mother        MI   Peripheral vascular disease Mother    Hyperlipidemia Mother    Depression Brother        suicide   Diabetes Brother    Diabetes Brother    Sleep apnea Brother    Sleep apnea Son    Diabetes Son    Heart attack Son    Sudden Cardiac Death Son     SOCIAL HISTORY: Social History   Socioeconomic History   Marital status: Widowed    Spouse name: Not on file   Number of children: 3   Years of education: GED   Highest education level: Not on file  Occupational History   Occupation: works 1 day a week  Tobacco Use   Smoking status: Former    Current packs/day: 0.00    Types: Cigarettes    Start date: 78    Quit date: 1982    Years since quitting: 43.6   Smokeless tobacco: Never  Vaping Use   Vaping status: Never Used  Substance and Sexual Activity   Alcohol use: No    Alcohol/week: 0.0 standard drinks of alcohol   Drug use: Never   Sexual activity: Not on file  Other Topics Concern   Not on file  Social History Narrative    Patient is widowed. Patient has three children.Patient is right-handed.Patient drinks two cups of caffeine daily.  Patient is retired.Patient has a GED.   Social Drivers of Corporate investment banker Strain: Low Risk  (09/29/2023)   Overall Financial Resource Strain (CARDIA)    Difficulty of Paying Living Expenses: Not hard at all  Food Insecurity: No Food Insecurity (09/29/2023)   Hunger Vital Sign    Worried About Running Out of Food in the Last Year: Never true    Ran Out of Food in the Last Year: Never true  Transportation Needs: No Transportation Needs (09/29/2023)   PRAPARE - Administrator, Civil Service (Medical): No    Lack of Transportation (Non-Medical): No  Physical Activity: Inactive (09/29/2023)   Exercise Vital Sign    Days of Exercise per Week: 0 days    Minutes of Exercise per Session: 0 min  Stress: No Stress Concern Present (09/29/2023)   [employer] of Occupational Health - Occupational Stress Questionnaire    Feeling of Stress : Not at all  Social Connections: Moderately Isolated (09/29/2023)   Social Connection and Isolation Panel    Frequency of Communication with Friends and Family: More than three times a week    Frequency of Social Gatherings with Friends and Family: More than three times a week    Attends Religious Services: More than 4 times per year    Active Member of [employer] or Organizations: No    Attends Banker Meetings: Never    Marital Status: Widowed  Intimate Partner Violence: Not At Risk (09/29/2023)  Humiliation, Afraid, Rape, and Kick questionnaire    Fear of Current or Ex-Partner: No    Emotionally Abused: No    Physically Abused: No    Sexually Abused: No      PHYSICAL EXAM  Vitals:   01/11/24 1301 01/11/24 1328  BP: (!) 169/84 130/80  Pulse: 100   Weight: 169 lb (76.7 kg)   Height: 5' 2 (1.575 m)     Body mass index is 30.91 kg/m.  Generalized: Well developed, in no acute distress  Chest:  Lungs clear to auscultation  Neurological examination  Mentation: Alert oriented to time, place, history taking. Follows all commands speech and language fluent Cranial nerve II-XII: Facial symmetry noted  DIAGNOSTIC DATA (LABS, IMAGING, TESTING) - I reviewed patient records, labs, notes, testing and imaging myself where available.  Lab Results  Component Value Date   WBC 7.4 03/05/2023   HGB 13.7 03/05/2023   HCT 42.1 03/05/2023   MCV 89.9 03/05/2023   PLT 293.0 03/05/2023      Component Value Date/Time   NA 138 10/08/2023 0834   K 4.0 10/08/2023 0834   CL 99 10/08/2023 0834   CO2 30 10/08/2023 0834   GLUCOSE 92 10/08/2023 0834   BUN 28 (H) 10/08/2023 0834   CREATININE 1.10 (H) 10/08/2023 0834   CALCIUM  9.4 10/08/2023 0834   PROT 7.2 03/05/2023 1138   ALBUMIN 4.4 03/05/2023 1138   AST 20 03/05/2023 1138   ALT 23 03/05/2023 1138   ALKPHOS 64 03/05/2023 1138   BILITOT 0.3 03/05/2023 1138   GFRNONAA >60 05/24/2018 0454   GFRAA >60 05/24/2018 0454   Lab Results  Component Value Date   CHOL 158 03/05/2023   HDL 56.20 03/05/2023   LDLCALC 68 03/05/2023   LDLDIRECT 110.0 02/12/2022   TRIG 173.0 (H) 03/05/2023   CHOLHDL 3 03/05/2023   Lab Results  Component Value Date   HGBA1C 8.3 (H) 10/08/2023   No results found for: VITAMINB12 Lab Results  Component Value Date   TSH 3.51 03/05/2023      ASSESSMENT AND PLAN 83 y.o. year old female  has a past medical history of History of ischemic colitis (05/2018), Hyperlipidemia, mixed, Hypersomnia, persistent, Hypertension, Hypothyroidism, Migraine, Narcolepsy with cataplexy, Subcutaneous mass, Type 2 diabetes mellitus treated with insulin  (HCC), Wears glasses, and Wears hearing aid in both ears. here with:  Obstructive sleep apnea on CPAP Narcolepsy with cataplexy  -CPAP report shows good compliance -Good treatment of apnea -Continue Tofranil  50 mg at bedtime -Continue Ritalin  10 mg 3 times a day -Encouraged her to  check blood pressure daily at home.  If it is greater than 130/90 consistently advised to let us  know. -Advised if symptoms worsen or she develops new symptoms she should let us  know -Follow-up in 6 to 8 months or sooner if needed     Duwaine Russell, MSN, NP-C 01/11/2024, 1:15 PM [facility] [address] [place], [place] [address] [phone]  The patient's condition requires frequent monitoring and adjustments in the treatment plan, reflecting the ongoing complexity of care.  This provider is the continuing focal point for all needed services for this condition.

## 2024-01-11 NOTE — Patient Instructions (Signed)
 Continue Tofranil  50 mg at bedtime -Continue Ritalin  10 mg 3 times a day - check blood pressure daily at home.  If it is greater than 130/90 consistently advised to let us  know. -Advised if symptoms worsen or she develops new symptoms she should let us  know

## 2024-01-13 DIAGNOSIS — Z794 Long term (current) use of insulin: Secondary | ICD-10-CM | POA: Diagnosis not present

## 2024-01-13 DIAGNOSIS — M1990 Unspecified osteoarthritis, unspecified site: Secondary | ICD-10-CM | POA: Diagnosis not present

## 2024-01-18 ENCOUNTER — Ambulatory Visit: Admitting: Endocrinology

## 2024-01-18 ENCOUNTER — Ambulatory Visit: Payer: Self-pay | Admitting: Endocrinology

## 2024-01-18 ENCOUNTER — Encounter: Payer: Self-pay | Admitting: Endocrinology

## 2024-01-18 VITALS — BP 130/80 | HR 95 | Resp 16 | Ht 62.0 in | Wt 167.0 lb

## 2024-01-18 DIAGNOSIS — Z794 Long term (current) use of insulin: Secondary | ICD-10-CM

## 2024-01-18 DIAGNOSIS — E063 Autoimmune thyroiditis: Secondary | ICD-10-CM | POA: Diagnosis not present

## 2024-01-18 DIAGNOSIS — E782 Mixed hyperlipidemia: Secondary | ICD-10-CM | POA: Diagnosis not present

## 2024-01-18 DIAGNOSIS — E1165 Type 2 diabetes mellitus with hyperglycemia: Secondary | ICD-10-CM | POA: Diagnosis not present

## 2024-01-18 LAB — POCT GLYCOSYLATED HEMOGLOBIN (HGB A1C): Hemoglobin A1C: 7.6 % — AB (ref 4.0–5.6)

## 2024-01-18 MED ORDER — LANTUS SOLOSTAR 100 UNIT/ML ~~LOC~~ SOPN: PEN_INJECTOR | SUBCUTANEOUS | 4 refills | Status: AC

## 2024-01-18 NOTE — Patient Instructions (Addendum)
 Latest Reference Range & Units 07/09/23 08:16 10/08/23 08:34 01/18/24 14:27  Hemoglobin A1C 4.0 - 5.6 % 9.4 (H) 8.3 (H) 7.6 !  (H): Data is abnormally high !: Data is abnormal  No change.

## 2024-01-18 NOTE — Progress Notes (Signed)
 Outpatient Endocrinology Note Amanda Coral Soler, MD  01/18/24  Patient's Name: Amanda King    DOB: 1940/09/20    MRN: [id number]                                                    REASON OF VISIT: Follow up of type 2 diabetes mellitus  PCP: Tower, Laine LABOR, MD  HISTORY OF PRESENT ILLNESS:   Amanda King is a 83 y.o. old female with past medical history listed below, is here for follow up for type 2 diabetes mellitus / hypothyroidism.   Pertinent Diabetes History: Patient was diagnosed with type 2 diabetes mellitus in 2008.  Patient has uncontrolled type 2 diabetes mellitus.  Chronic Diabetes Complications : Retinopathy: no. Last ophthalmology exam was done on annually, reportedly, following with ophthalmology regularly.  Nephropathy: CKD IIa, on ACE/ARB /lisinopril . Peripheral neuropathy: no Coronary artery disease: no Stroke: no  Relevant comorbidities and cardiovascular risk factors: Obesity: yes Body mass index is 30.54 kg/m.  Hypertension: Yes  Hyperlipidemia : Yes, on statin   Current / Home Diabetic regimen includes: Lantus  30 units in the evening. Humalog  4 units with breakfast and 6 units with supper. Metformin  extended release 750 mg 2 times a day. Jardiance  25 mg daily.  Prior diabetic medications: She had taken glipizide , Januvia  in the past.  Diarrhea with higher dose of metformin  in the past.  Glycemic data:   She has One Touch Verio flex glucometer.  Glucometer download from July 20 8 January 18, 2024.  Lowest blood sugar 101, highest blood sugar 165.  She has been checking blood sugar at different times of the day.  Fasting blood sugar 153, 123, 165, 101.  Blood sugar in the afternoon and bedtime 154, 127, 126, 141.  Hypoglycemia: Patient has no hypoglycemic episodes. Patient has hypoglycemia awareness.  Factors modifying glucose control: 1.  Diabetic diet assessment: 3 meals a day.  2.  Staying active or exercising: No formal exercise.  She will be  active during daytime with grandkids.  3.  Medication compliance: compliant all of the time.  # Hypothyroidism -Currently taking levothyroxine  88 mcg daily since November 2022.  Thyroid  lab normal.  Interval history  Glucometer data as reviewed above.  Hemoglobin A1C 7.6% has improved.  Mostly acceptable blood sugar.  Diabetes regimen as reviewed as noted above.  No hypoglycemia.  She denies numbness and tingling of the feet.  No vision problem.  No other complaints today.  She has been taking levothyroxine  88 mcg daily, no hypo and hyperthyroid symptoms.  REVIEW OF SYSTEMS As per history of present illness.   PAST MEDICAL HISTORY: Past Medical History:  Diagnosis Date   History of ischemic colitis 05/2018   Hyperlipidemia, mixed    Hypersomnia, persistent    Hypertension    Hypothyroidism    endocrinologist--- dr von   Migraine    Narcolepsy with cataplexy    followed by neurologist--- dr dohmeier   Subcutaneous mass    left shoulder   Type 2 diabetes mellitus treated with insulin  [facility])    endocrinologist--- dr a. kumar   (12-02-2021  per pt check blood sugar daily 2 hours after evening meal)   Wears glasses    Wears hearing aid in both ears     PAST SURGICAL HISTORY: Past Surgical History:  Procedure Laterality  Date   APPENDECTOMY  1948   BIOPSY  05/24/2018   Procedure: BIOPSY;  Surgeon: Wilhelmenia Aloha Raddle., MD;  Location: [facility] ENDOSCOPY;  Service: Gastroenterology;;   COLONOSCOPY N/A 05/24/2018   Procedure: COLONOSCOPY;  Surgeon: Wilhelmenia Aloha Raddle., MD;  Location: [facility] ENDOSCOPY;  Service: Gastroenterology;  Laterality: N/A;   MASS EXCISION Left 12/06/2021   Procedure: LEFT SHOULDER SUBCUTANEOUS MASS EXCISION;  Surgeon: Lyndel Deward PARAS, MD;  Location: [facility];  Service: General;  Laterality: Left;   POSTERIOR CERVICAL FUSION/FORAMINOTOMY  02/05/1975   C5-C6-C7   VAGINAL HYSTERECTOMY  1971    ALLERGIES: Allergies  Allergen Reactions    Penicillins Nausea And Vomiting and Rash    Has patient had a PCN reaction causing immediate rash, facial/tongue/throat swelling, SOB or lightheadedness with hypotension: Unknown Has patient had a PCN reaction causing severe rash involving mucus membranes or skin necrosis: Unknown Has patient had a PCN reaction that required hospitalization: Unknown Has patient had a PCN reaction occurring within the last 10 years: No If all of the above answers are NO, then may proceed with Cephalosporin use.     FAMILY HISTORY:  Family History  Problem Relation Age of Onset   Heart disease Mother        MI   Peripheral vascular disease Mother    Hyperlipidemia Mother    Depression Brother        suicide   Diabetes Brother    Diabetes Brother    Sleep apnea Brother    Sleep apnea Son    Diabetes Son    Heart attack Son    Sudden Cardiac Death Son     SOCIAL HISTORY: Social History   Socioeconomic History   Marital status: Widowed    Spouse name: Not on file   Number of children: 3   Years of education: GED   Highest education level: Not on file  Occupational History   Occupation: works 1 day a week  Tobacco Use   Smoking status: Former    Current packs/day: 0.00    Types: Cigarettes    Start date: 42    Quit date: 1982    Years since quitting: 43.6   Smokeless tobacco: Never  Vaping Use   Vaping status: Never Used  Substance and Sexual Activity   Alcohol use: No    Alcohol/week: 0.0 standard drinks of alcohol   Drug use: Never   Sexual activity: Not on file  Other Topics Concern   Not on file  Social History Narrative   Patient is widowed. Patient has three children.Patient is right-handed.Patient drinks two cups of caffeine daily.  Patient is retired.Patient has a GED.   Social Drivers of Corporate investment banker Strain: Low Risk  (09/29/2023)   Overall Financial Resource Strain (CARDIA)    Difficulty of Paying Living Expenses: Not hard at all  Food  Insecurity: No Food Insecurity (09/29/2023)   Hunger Vital Sign    Worried About Running Out of Food in the Last Year: Never true    Ran Out of Food in the Last Year: Never true  Transportation Needs: No Transportation Needs (09/29/2023)   PRAPARE - Administrator, Civil Service (Medical): No    Lack of Transportation (Non-Medical): No  Physical Activity: Inactive (09/29/2023)   Exercise Vital Sign    Days of Exercise per Week: 0 days    Minutes of Exercise per Session: 0 min  Stress: No Stress Concern Present (09/29/2023)  [employer] of Occupational Health - Occupational Stress Questionnaire    Feeling of Stress : Not at all  Social Connections: Moderately Isolated (09/29/2023)   Social Connection and Isolation Panel    Frequency of Communication with Friends and Family: More than three times a week    Frequency of Social Gatherings with Friends and Family: More than three times a week    Attends Religious Services: More than 4 times per year    Active Member of [employer] or Organizations: No    Attends Banker Meetings: Never    Marital Status: Widowed    MEDICATIONS:  Current Outpatient Medications  Medication Sig Dispense Refill   cetirizine (ZYRTEC) 10 MG tablet Take 10 mg by mouth daily as needed for allergies or rhinitis.      empagliflozin  (JARDIANCE ) 25 MG TABS tablet Take 1 tablet (25 mg total) by mouth daily before breakfast. 90 tablet 3   fenofibrate  (TRICOR ) 145 MG tablet Take 1 tablet (145 mg total) by mouth daily. 90 tablet 3   glucose blood (ONETOUCH VERIO) test strip USE 1 STRIP TO CHECK GLUCOSE 3 times DAILY 300 each 2   HUMALOG  KWIKPEN 100 UNIT/ML KwikPen INJECT 4 UNITS SUBCUTANEOUSLY WITH BREAKFAST AND 6 UNITS WITH SUPPER BEFORE MEALS 15 mL 3   hydrochlorothiazide  (HYDRODIURIL ) 25 MG tablet Take 1/2 (one-half) tablet by mouth once daily 45 tablet 0   imipramine  (TOFRANIL ) 50 MG tablet Take 1 tablet (50 mg total) by mouth at bedtime. 90  tablet 1   levothyroxine  (SYNTHROID ) 88 MCG tablet TAKE 1 TABLET BY MOUTH DAILY BEFORE BREAKFAST 90 tablet 3   lisinopril  (ZESTRIL ) 20 MG tablet Take 1 tablet by mouth once daily 90 tablet 0   metFORMIN  (GLUCOPHAGE -XR) 750 MG 24 hr tablet Take 1 tablet (750 mg total) by mouth 2 (two) times daily. 180 tablet 3   methylphenidate  (RITALIN ) 20 MG tablet Take 0.5 tablets (10 mg total) by mouth 3 (three) times daily with meals. 45 tablet 0   OneTouch Delica Lancets 30G MISC USE 1  TO CHECK GLUCOSE ONCE DAILY 100 each 0   polyethylene glycol (MIRALAX  / GLYCOLAX ) packet Take 17 g by mouth daily as needed. 14 each 0   Probiotic Product (ALIGN) 4 MG CAPS Take 1 capsule by mouth daily.     rosuvastatin  (CRESTOR ) 20 MG tablet Take 1 tablet by mouth once daily 90 tablet 2   LANTUS  SOLOSTAR 100 UNIT/ML Solostar Pen INJECT 30 UNITS SUBCUTANEOUSLY ONCE DAILY 15 mL 4   No current facility-administered medications for this visit.    PHYSICAL EXAM: Vitals:   01/18/24 1425  BP: 130/80  Pulse: 95  Resp: 16  SpO2: 96%  Weight: 167 lb (75.8 kg)  Height: 5' 2 (1.575 m)     Body mass index is 30.54 kg/m.  Wt Readings from Last 3 Encounters:  01/18/24 167 lb (75.8 kg)  01/11/24 169 lb (76.7 kg)  12/24/23 167 lb 2 oz (75.8 kg)    General: Well developed, well nourished female in no apparent distress.  HEENT: AT/[place], no external lesions.  Eyes: Conjunctiva clear and no icterus. Neck: Neck supple  Lungs: Respirations not labored Neurologic: Alert, oriented, normal speech Extremities / Skin: Dry.  Psychiatric: Does not appear depressed or anxious  Diabetic Foot Exam - Simple   Simple Foot Form Diabetic Foot exam was performed with the following findings: Yes 01/18/2024  2:43 PM  Visual Inspection No deformities, no ulcerations, no other skin breakdown bilaterally: Yes Sensation  Testing Intact to touch and monofilament testing bilaterally: Yes Pulse Check Posterior Tibialis and Dorsalis pulse  intact bilaterally: Yes Comments    LABS Reviewed Lab Results  Component Value Date   HGBA1C 7.6 (A) 01/18/2024   HGBA1C 8.3 (H) 10/08/2023   HGBA1C 9.4 (H) 07/09/2023   Lab Results  Component Value Date   FRUCTOSAMINE 309 (H) 06/18/2021   FRUCTOSAMINE 267 11/22/2018   FRUCTOSAMINE 349 (H) 08/18/2018   Lab Results  Component Value Date   CHOL 158 03/05/2023   HDL 56.20 03/05/2023   LDLCALC 68 03/05/2023   LDLDIRECT 110.0 02/12/2022   TRIG 173.0 (H) 03/05/2023   CHOLHDL 3 03/05/2023   Lab Results  Component Value Date   MICRALBCREAT 6 07/09/2023   MICRALBCREAT 5.2 12/21/2008   Lab Results  Component Value Date   CREATININE 1.10 (H) 10/08/2023   Lab Results  Component Value Date   GFR 47.04 (L) 03/05/2023    ASSESSMENT / PLAN  1. Uncontrolled type 2 diabetes mellitus with hyperglycemia, with long-term current use of insulin  (HCC)   2. Acquired autoimmune hypothyroidism   3. Mixed hyperlipidemia    Diabetes Mellitus type 2, complicated by CKD. - Diabetic status / severity: Uncontrolled.  Improving.  Lab Results  Component Value Date   HGBA1C 7.6 (A) 01/18/2024    - Hemoglobin A1c goal : <7.5%  Glucometer data mostly acceptable with average blood sugar 140.  - Medications: See below.  No change.  I) continue lantus  30 units daily. II) continue Humalog  4 units with breakfast and 6 units supper. III) continue metformin  750mg  two times a day. IV) continue Jardiance  25 mg daily.  - Home glucose testing: With meals and bedtime 3-4 times a day.    - Discussed/ Gave Hypoglycemia treatment plan.  # Consult : not required at this time.   # Annual urine for microalbuminuria/ creatinine ratio, no microalbuminuria currently, continue ACE/ARB /lisinopril .   Last  Lab Results  Component Value Date   MICRALBCREAT 6 07/09/2023    # Foot check nightly / neuropathy.  # Annual dilated diabetic eye exams.   - Diet: Make healthy diabetic food choices - Life  style / activity / exercise: Discussed.  2. Blood pressure  -  BP Readings from Last 1 Encounters:  01/18/24 130/80    - Control is in target.  - No change in current plans.  3. Lipid status / Hyperlipidemia - Last  Lab Results  Component Value Date   LDLCALC 68 03/05/2023   - Continue rosuvastatin  20 mg daily.  # Hypothyroidism -Continue levothyroxine  88 mcg daily.  Annual thyroid  lab.  Diagnoses and all orders for this visit:  Uncontrolled type 2 diabetes mellitus with hyperglycemia, with long-term current use of insulin  (HCC) -     POCT glycosylated hemoglobin (Hb A1C) -     LANTUS  SOLOSTAR 100 UNIT/ML Solostar Pen; INJECT 30 UNITS SUBCUTANEOUSLY ONCE DAILY -     Microalbumin / creatinine urine ratio -     Basic metabolic panel with GFR -     Hemoglobin A1c  Acquired autoimmune hypothyroidism -     T4, free -     TSH  Mixed hyperlipidemia -     Lipid panel     DISPOSITION Follow up in clinic in 4 months suggested.  Labs prior to follow-up visit as ordered..  All questions answered and patient verbalized understanding of the plan.  Amanda Kaisa Wofford, MD [facility] [address] [place], [address] [place], [place] [address] Phone # [phone]  At least part of this note was generated using voice recognition software. Inadvertent word errors may have occurred, which were not recognized during the proofreading process.

## 2024-01-22 ENCOUNTER — Other Ambulatory Visit: Payer: Self-pay | Admitting: Family Medicine

## 2024-01-25 ENCOUNTER — Other Ambulatory Visit: Payer: Self-pay | Admitting: Family Medicine

## 2024-01-25 ENCOUNTER — Other Ambulatory Visit: Payer: Self-pay | Admitting: Adult Health

## 2024-01-25 DIAGNOSIS — G4710 Hypersomnia, unspecified: Secondary | ICD-10-CM

## 2024-01-25 NOTE — Telephone Encounter (Signed)
 Last visit:01/11/24  Next visit: 07/18/24  Per [place] registry, last fill:

## 2024-01-25 NOTE — Telephone Encounter (Signed)
 Pt is needing a refill request for her methylphenidate  (RITALIN ) 20 MG tablet sent in to the [facility] on [address]

## 2024-01-25 NOTE — Telephone Encounter (Signed)
 Med refilled once, pt is due for her CPE on or after 03/05/24, please schedule

## 2024-01-26 MED ORDER — METHYLPHENIDATE HCL 20 MG PO TABS
10.0000 mg | ORAL_TABLET | Freq: Three times a day (TID) | ORAL | 0 refills | Status: DC
Start: 2024-01-26 — End: 2024-03-10

## 2024-01-26 NOTE — Telephone Encounter (Signed)
 Lvmtcb, sent mychart message

## 2024-02-11 ENCOUNTER — Ambulatory Visit (INDEPENDENT_AMBULATORY_CARE_PROVIDER_SITE_OTHER): Admitting: Physician Assistant

## 2024-02-11 ENCOUNTER — Encounter: Payer: Self-pay | Admitting: Physician Assistant

## 2024-02-11 DIAGNOSIS — M1712 Unilateral primary osteoarthritis, left knee: Secondary | ICD-10-CM | POA: Diagnosis not present

## 2024-02-11 DIAGNOSIS — M25562 Pain in left knee: Secondary | ICD-10-CM

## 2024-02-11 LAB — POCT GLYCOSYLATED HEMOGLOBIN (HGB A1C): Hemoglobin A1C: 7.6 % — AB (ref 4.0–5.6)

## 2024-02-11 NOTE — Progress Notes (Signed)
 Office Visit Note   Patient: Amanda King           Date of Birth: 18-Dec-1940           MRN: [id number] Visit Date: 02/11/2024              Requested by: Tower, Laine LABOR, MD [address] [place],  [place] [address] PCP: Randeen Laine LABOR, MD  No chief complaint on file.     HPI: Patient is a pleasant 83 year old woman who comes in to with a chief complaint of left knee and hip pain.  I saw her earlier this year.  She was requesting a steroid injection unfortunately her hemoglobin A1c was over 8.  She has had a lot of fluctuations with her A1c.  I told her that I could not do the injection until her A1c came down below 7.5.  She represents today requesting an injection her most recent A1c last month was 7.6  Assessment & Plan: Visit Diagnoses:  1. Left knee pain, unspecified chronicity   2. Unilateral primary osteoarthritis, left knee     Plan: Patient has fragile diabetes.  I still am concerned given her A1c.  We talked about trying viscosupplementation and because of her diabetes I think this would be a better choice we will go forward with authorization This patient is diagnosed with osteoarthritis of the knee(s).    Radiographs show evidence of joint space narrowing, osteophytes, subchondral sclerosis and/or subchondral cysts.  This patient has knee pain which interferes with functional and activities of daily living.    This patient has experienced inadequate response, adverse effects and/or intolerance with conservative treatments such as acetaminophen , NSAIDS, topical creams, physical therapy or regular exercise, knee bracing and/or weight loss.   This patient has experienced inadequate response or has a contraindication to intra articular steroid injections for at least 3 months.   This patient is not scheduled to have a total knee replacement within 6 months of starting treatment with viscosupplementation.   Follow-Up Instructions: No follow-ups on file.   Ortho  Exam  Patient is alert, oriented, no adenopathy, well-dressed, normal affect, normal respiratory effort No erythema no effusion she has grinding with range of motion.  Ligamentously intact.  Pain over the medial lateral and posterior joint lines neurovascular intact compartments are soft    Imaging: No results found. No images are attached to the encounter.  Labs: Lab Results  Component Value Date   HGBA1C 7.6 (A) 02/11/2024   HGBA1C 7.6 (A) 01/18/2024   HGBA1C 8.3 (H) 10/08/2023   ESRSEDRATE 16 07/20/2017   REPTSTATUS 05/22/2018 FINAL 05/17/2018   CULT  05/17/2018    NO GROWTH 5 DAYS Performed at [facility], [address], [place], [place] [address]    LABORGA NO GROWTH 11/24/2011     Lab Results  Component Value Date   ALBUMIN 4.4 03/05/2023   ALBUMIN 4.7 11/12/2021   ALBUMIN 4.8 04/12/2021    Lab Results  Component Value Date   MG 1.7 05/19/2018   No results found for: VD25OH  No results found for: PREALBUMIN    Latest Ref Rng & Units 03/05/2023   11:38 AM 01/03/2022    4:15 PM 12/06/2021    8:11 AM  CBC EXTENDED  WBC 4.0 - 10.5 K/uL 7.4  9.0    RBC 3.87 - 5.11 Mil/uL 4.69  4.88    Hemoglobin 12.0 - 15.0 g/dL 86.2  85.3  86.0   HCT  36.0 - 46.0 % 42.1  42.1  41.0   Platelets 150.0 - 400.0 K/uL 293.0  338    NEUT# 1.4 - 7.7 K/uL 4.6  5,229    Lymph# 0.7 - 4.0 K/uL 2.0  2,979       There is no height or weight on file to calculate BMI.  Orders:  Orders Placed This Encounter  Procedures   POCT HgB A1C   No orders of the defined types were placed in this encounter.    Procedures: No procedures performed  Clinical Data: No additional findings.  ROS:  All other systems negative, except as noted in the HPI. Review of Systems  Objective: Vital Signs: There were no vitals taken for this visit.  Specialty Comments:  No specialty comments available.  PMFS History: Patient Active Problem List   Diagnosis Date Noted   Pedal edema  12/24/2023   Retained tick parts of thigh 10/02/2023   Unilateral primary osteoarthritis, left knee 06/30/2023   OSA on CPAP 01/05/2023   At risk for obstructive sleep apnea 08/05/2022   Allergic rhinitis 04/21/2022   Encounter for screening mammogram for breast cancer 01/03/2022   Grief reaction 2021-03-03   Family history of sudden cardiac death March 03, 2021   Localized swelling, mass and lump, neck 03/03/21   History of ischemic colitis    Constipation    Routine general medical examination at a health care facility 01/31/2016   Narcolepsy with cataplexy 10/31/2014   Encounter for Medicare annual wellness exam 09/22/2013   Hypersomnia, persistent 07/15/2013   Colon cancer screening 12/31/2011   ANXIETY 05/06/2010   DERMATOPHYTOSIS OF NAIL 12/21/2008   Hypothyroidism 09/02/2006   Diabetes type 2, uncontrolled 09/02/2006   Hyperlipidemia associated with type 2 diabetes mellitus (HCC) 09/02/2006   NARCOLEPSY W/CATAPLEXY 09/02/2006   Essential hypertension 09/02/2006   PNEUMONIA, HX OF 09/02/2006   Past Medical History:  Diagnosis Date   History of ischemic colitis 05/2018   Hyperlipidemia, mixed    Hypersomnia, persistent    Hypertension    Hypothyroidism    endocrinologist--- dr von   Migraine    Narcolepsy with cataplexy    followed by neurologist--- dr dohmeier   Subcutaneous mass    left shoulder   Type 2 diabetes mellitus treated with insulin  [facility])    endocrinologist--- dr a. von   (12-02-2021  per pt check blood sugar daily 2 hours after evening meal)   Wears glasses    Wears hearing aid in both ears     Family History  Problem Relation Age of Onset   Heart disease Mother        MI   Peripheral vascular disease Mother    Hyperlipidemia Mother    Depression Brother        suicide   Diabetes Brother    Diabetes Brother    Sleep apnea Brother    Sleep apnea Son    Diabetes Son    Heart attack Son    Sudden Cardiac Death Son     Past Surgical History:   Procedure Laterality Date   APPENDECTOMY  1948   BIOPSY  05/24/2018   Procedure: BIOPSY;  Surgeon: Wilhelmenia, Aloha Raddle., MD;  Location: [facility] ENDOSCOPY;  Service: Gastroenterology;;   COLONOSCOPY N/A 05/24/2018   Procedure: COLONOSCOPY;  Surgeon: Wilhelmenia Aloha Raddle., MD;  Location: [facility] ENDOSCOPY;  Service: Gastroenterology;  Laterality: N/A;   MASS EXCISION Left 12/06/2021   Procedure: LEFT SHOULDER SUBCUTANEOUS MASS EXCISION;  Surgeon: Lyndel Deward PARAS, MD;  Location: [facility];  Service: General;  Laterality: Left;   POSTERIOR CERVICAL FUSION/FORAMINOTOMY  02/05/1975   C5-C6-C7   VAGINAL HYSTERECTOMY  1971   Social History   Occupational History   Occupation: works 1 day a week  Tobacco Use   Smoking status: Former    Current packs/day: 0.00    Types: Cigarettes    Start date: 1972    Quit date: 1982    Years since quitting: 43.7   Smokeless tobacco: Never  Vaping Use   Vaping status: Never Used  Substance and Sexual Activity   Alcohol use: No    Alcohol/week: 0.0 standard drinks of alcohol   Drug use: Never   Sexual activity: Not on file

## 2024-02-18 ENCOUNTER — Other Ambulatory Visit: Payer: Self-pay | Admitting: Neurology

## 2024-02-18 DIAGNOSIS — G47411 Narcolepsy with cataplexy: Secondary | ICD-10-CM

## 2024-02-19 ENCOUNTER — Telehealth: Payer: Self-pay

## 2024-02-19 NOTE — Telephone Encounter (Signed)
 Amanda King

## 2024-02-19 NOTE — Telephone Encounter (Signed)
 Talked with patients daughter concerning gel injection. Will submit

## 2024-02-19 NOTE — Telephone Encounter (Signed)
 Daughter, Marval, was calling about gel injection PA. Call back at [phone]

## 2024-03-07 ENCOUNTER — Ambulatory Visit (INDEPENDENT_AMBULATORY_CARE_PROVIDER_SITE_OTHER): Admitting: Family Medicine

## 2024-03-07 ENCOUNTER — Encounter: Payer: Self-pay | Admitting: Family Medicine

## 2024-03-07 VITALS — BP 124/80 | HR 97 | Temp 98.2°F | Ht 60.75 in | Wt 168.2 lb

## 2024-03-07 DIAGNOSIS — I10 Essential (primary) hypertension: Secondary | ICD-10-CM

## 2024-03-07 DIAGNOSIS — J301 Allergic rhinitis due to pollen: Secondary | ICD-10-CM

## 2024-03-07 DIAGNOSIS — E1169 Type 2 diabetes mellitus with other specified complication: Secondary | ICD-10-CM | POA: Diagnosis not present

## 2024-03-07 DIAGNOSIS — E039 Hypothyroidism, unspecified: Secondary | ICD-10-CM

## 2024-03-07 DIAGNOSIS — Z0000 Encounter for general adult medical examination without abnormal findings: Secondary | ICD-10-CM

## 2024-03-07 DIAGNOSIS — E785 Hyperlipidemia, unspecified: Secondary | ICD-10-CM

## 2024-03-07 DIAGNOSIS — Z1231 Encounter for screening mammogram for malignant neoplasm of breast: Secondary | ICD-10-CM

## 2024-03-07 LAB — CBC WITH DIFFERENTIAL/PLATELET
Basophils Absolute: 0 K/uL (ref 0.0–0.1)
Basophils Relative: 0.5 % (ref 0.0–3.0)
Eosinophils Absolute: 0.2 K/uL (ref 0.0–0.7)
Eosinophils Relative: 2.2 % (ref 0.0–5.0)
HCT: 40.1 % (ref 36.0–46.0)
Hemoglobin: 13.1 g/dL (ref 12.0–15.0)
Lymphocytes Relative: 30.4 % (ref 12.0–46.0)
Lymphs Abs: 2.3 K/uL (ref 0.7–4.0)
MCHC: 32.6 g/dL (ref 30.0–36.0)
MCV: 89 fl (ref 78.0–100.0)
Monocytes Absolute: 0.6 K/uL (ref 0.1–1.0)
Monocytes Relative: 7.7 % (ref 3.0–12.0)
Neutro Abs: 4.5 K/uL (ref 1.4–7.7)
Neutrophils Relative %: 59.2 % (ref 43.0–77.0)
Platelets: 316 K/uL (ref 150.0–400.0)
RBC: 4.51 Mil/uL (ref 3.87–5.11)
RDW: 14 % (ref 11.5–15.5)
WBC: 7.7 K/uL (ref 4.0–10.5)

## 2024-03-07 LAB — COMPREHENSIVE METABOLIC PANEL WITH GFR
ALT: 20 U/L (ref 0–35)
AST: 20 U/L (ref 0–37)
Albumin: 4.6 g/dL (ref 3.5–5.2)
Alkaline Phosphatase: 55 U/L (ref 39–117)
BUN: 26 mg/dL — ABNORMAL HIGH (ref 6–23)
CO2: 30 meq/L (ref 19–32)
Calcium: 9.8 mg/dL (ref 8.4–10.5)
Chloride: 99 meq/L (ref 96–112)
Creatinine, Ser: 0.94 mg/dL (ref 0.40–1.20)
GFR: 55.78 mL/min — ABNORMAL LOW (ref >60.00)
Glucose, Bld: 80 mg/dL (ref 70–99)
Potassium: 4 meq/L (ref 3.5–5.1)
Sodium: 137 meq/L (ref 135–145)
Total Bilirubin: 0.4 mg/dL (ref 0.2–1.2)
Total Protein: 7.1 g/dL (ref 6.0–8.3)

## 2024-03-07 LAB — LIPID PANEL
Cholesterol: 148 mg/dL (ref 0–200)
HDL: 51.3 mg/dL (ref >39.00)
LDL Cholesterol: 61 mg/dL (ref 0–99)
NonHDL: 97.1
Total CHOL/HDL Ratio: 3
Triglycerides: 180 mg/dL — ABNORMAL HIGH (ref 0.0–149.0)
VLDL: 36 mg/dL (ref 0.0–40.0)

## 2024-03-07 NOTE — Assessment & Plan Note (Signed)
 Some congestion in season  Saline irrigations helped   Has flonase-encouraged daily use in season

## 2024-03-07 NOTE — Assessment & Plan Note (Signed)
 Mammogram ordered at [place] Pt will call to schedule

## 2024-03-07 NOTE — Patient Instructions (Addendum)
 Keep walking  Add some strength training to your routine, this is important for bone and brain health and can reduce your risk of falls and help your body use insulin  properly and regulate weight  Light weights, exercise bands , and internet videos are a good way to start  Yoga (chair or regular), machines , floor exercises or a gym with machines are also good options   Please schedule your diabetic eye exam   You have an order for:  [x]   3D Mammogram  []   Bone Density     Please call for appointment:   [x]   [facility]  [address] [place] [address]  [phone]  []   [facility])   [address]  [place], [place] [address]  [phone]  []   [facility]      [address] [place], [place]        [phone]         []   [facility]  [address] [place], [place]  [phone]  []  [facility]   [address]   [place], [place] [address]  [phone]  []  [facility]  [address] [place], [place] [address] [phone]    Make sure to wear two piece clothing  No lotions powders or deodorants the day of the appointment Make sure to bring picture ID and insurance card.  Bring list of medications you are currently taking including any supplements.   Schedule your screening mammogram through MyChart!   Select [place] imaging sites can now be scheduled through MyChart.  Log into your MyChart account.  Go to 'Visit' (or 'Appointments' if  on mobile App) --> Schedule an  Appointment  Under 'Select a Reason for Visit' choose the Mammogram  Screening option.  Complete the pre-visit questions  and select the time and place that  best fits your schedule

## 2024-03-07 NOTE — Assessment & Plan Note (Signed)
Disc goals for lipids and reasons to control them Rev last labs with pt Rev low sat fat diet in detail   Currently taking Crestor 20 mg daily  Tricor 145 mg daily   Labs ordered

## 2024-03-07 NOTE — Progress Notes (Signed)
 Subjective:    Patient ID: Amanda King, female    DOB: April 10, 1941, 83 y.o.   MRN: [id number]  HPI  Here for health maintenance exam and to review chronic medical problems   Wt Readings from Last 3 Encounters:  03/07/24 168 lb 4 oz (76.3 kg)  01/18/24 167 lb (75.8 kg)  01/11/24 169 lb (76.7 kg)   32.05 kg/m  Vitals:   03/07/24 1057 03/07/24 1123  BP: (!) 154/86 124/80  Pulse: 97   Temp: 98.2 F (36.8 C)   SpO2: 98%     Immunization History  Administered Date(s) Administered   Fluad Quad(high Dose 65+) 02/22/2019, 04/16/2021   INFLUENZA, HIGH DOSE SEASONAL PF 05/13/2017, 03/09/2018, 04/06/2020, 04/03/2022, 02/15/2024   Influenza Split 03/10/2011   Influenza Whole 06/09/2002, 04/09/2007, 03/02/2008, 03/23/2009, 04/09/2010   Influenza,inj,Quad PF,6+ Mos 04/30/2015, 02/15/2016   Influenza-Unspecified 03/07/2014, 03/01/2023   PFIZER(Purple Top)SARS-COV-2 Vaccination 09/03/2019, 09/24/2019   Pneumococcal Conjugate Pcv21, Polysaccharide Crm197 Conjugaf 10/16/2023   Pneumococcal Conjugate-13 08/03/2015   Pneumococcal Polysaccharide-23 06/09/2002, 03/02/2008   Respiratory Syncytial Virus Vaccine,Recomb Aduvanted(Arexvy) 04/03/2022   Td 04/25/1997   Tdap 07/01/2011, 10/16/2023   Zoster Recombinant(Shingrix) 10/16/2019, 08/11/2023    Health Maintenance Due  Topic Date Due   Mammogram  10/12/2014   OPHTHALMOLOGY EXAM  01/08/2024   Eye exam -due for   Mammogram 2015-wants to get this  Self breast exam- no lumps   Gyn health-no problems   Colon cancer screening -out aged   Bone health  Dexa-declined in past  Falls- none  Fractures- none  Supplements D3  Exercise  Limited by knee pain  Some stairs Has some small weights    Mood    03/07/2024   11:01 AM 10/02/2023    8:47 AM 09/29/2023   11:32 AM 09/25/2022   11:50 AM 01/03/2022    3:27 PM  Depression screen PHQ 2/9  Decreased Interest 0 0 0 0 0  Down, Depressed, Hopeless 0 0 0 0 0  PHQ - 2 Score 0 0 0  0 0  Altered sleeping 0      Tired, decreased energy 0      Change in appetite 0      Feeling bad or failure about yourself  0      Trouble concentrating 0      Moving slowly or fidgety/restless 0      Suicidal thoughts 0      PHQ-9 Score 0      Difficult doing work/chores Not difficult at all        HTN bp is stable today  No cp or palpitations or headaches or edema  No side effects to medicines  BP Readings from Last 3 Encounters:  03/07/24 124/80  01/18/24 130/80  01/11/24 130/80     Lab Results  Component Value Date   NA 138 10/08/2023   K 4.0 10/08/2023   CO2 30 10/08/2023   GLUCOSE 92 10/08/2023   BUN 28 (H) 10/08/2023   CREATININE 1.10 (H) 10/08/2023   CALCIUM  9.4 10/08/2023   GFR 47.04 (L) 03/05/2023   EGFR 50 (L) 10/08/2023   GFRNONAA >60 05/24/2018   Hydrochlorothiazide  12.5 mg daily  Lisinopril  20 mg daily  Last GFR 50   DM2 Sees endocrinology -per pt doing pretty good    7.6 A1c last /pretty good for her  Jardiance  Insulin  Metformin  xr  Wants to loose weight  Eating air cooked chicken / salads    Hypothyroidism  Pt has no clinical  changes No change in energy level/ hair or skin/ edema and no tremor Lab Results  Component Value Date   TSH 3.51 03/05/2023    Levothyroxine  88 mcg daily -endo    Hyperlipidemia Lab Results  Component Value Date   CHOL 158 03/05/2023   HDL 56.20 03/05/2023   LDLCALC 68 03/05/2023   LDLDIRECT 110.0 02/12/2022   TRIG 173.0 (H) 03/05/2023   CHOLHDL 3 03/05/2023   Crestor  20 mg daily  Tricor  145 mg daily     Patient Active Problem List   Diagnosis Date Noted   Pedal edema 12/24/2023   Unilateral primary osteoarthritis, left knee 06/30/2023   OSA on CPAP 01/05/2023   At risk for obstructive sleep apnea 08/05/2022   Allergic rhinitis 04/21/2022   Encounter for screening mammogram for breast cancer 01/03/2022   Grief reaction February 27, 2021   Family history of sudden cardiac death 2021/02/27   Localized  swelling, mass and lump, neck 02/27/21   History of ischemic colitis    Routine general medical examination at a health care facility 01/31/2016   Narcolepsy with cataplexy 10/31/2014   Encounter for Medicare annual wellness exam 09/22/2013   Hypersomnia, persistent 07/15/2013   ANXIETY 05/06/2010   DERMATOPHYTOSIS OF NAIL 12/21/2008   Hypothyroidism 09/02/2006   Diabetes type 2, uncontrolled 09/02/2006   Hyperlipidemia associated with type 2 diabetes mellitus (HCC) 09/02/2006   NARCOLEPSY W/CATAPLEXY 09/02/2006   Essential hypertension 09/02/2006   PNEUMONIA, HX OF 09/02/2006   Past Medical History:  Diagnosis Date   History of ischemic colitis 05/2018   Hyperlipidemia, mixed    Hypersomnia, persistent    Hypertension    Hypothyroidism    endocrinologist--- dr von   Migraine    Narcolepsy with cataplexy    followed by neurologist--- dr dohmeier   Subcutaneous mass    left shoulder   Type 2 diabetes mellitus treated with insulin  [facility])    endocrinologist--- dr a. von   (12-02-2021  per pt check blood sugar daily 2 hours after evening meal)   Wears glasses    Wears hearing aid in both ears    Past Surgical History:  Procedure Laterality Date   APPENDECTOMY  1948   BIOPSY  05/24/2018   Procedure: BIOPSY;  Surgeon: Wilhelmenia Aloha Raddle., MD;  Location: [facility] ENDOSCOPY;  Service: Gastroenterology;;   COLONOSCOPY N/A 05/24/2018   Procedure: COLONOSCOPY;  Surgeon: Wilhelmenia Aloha Raddle., MD;  Location: [facility] ENDOSCOPY;  Service: Gastroenterology;  Laterality: N/A;   MASS EXCISION Left 12/06/2021   Procedure: LEFT SHOULDER SUBCUTANEOUS MASS EXCISION;  Surgeon: Lyndel Deward PARAS, MD;  Location: [facility];  Service: General;  Laterality: Left;   POSTERIOR CERVICAL FUSION/FORAMINOTOMY  02-28-75   C5-C6-C7   VAGINAL HYSTERECTOMY  1971   Social History   Tobacco Use   Smoking status: Former    Current packs/day: 0.00    Types: Cigarettes    Start date:  54    Quit date: 1982    Years since quitting: 43.7   Smokeless tobacco: Never  Vaping Use   Vaping status: Never Used  Substance Use Topics   Alcohol use: No    Alcohol/week: 0.0 standard drinks of alcohol   Drug use: Never   Family History  Problem Relation Age of Onset   Heart disease Mother        MI   Peripheral vascular disease Mother    Hyperlipidemia Mother    Depression Brother        suicide  Diabetes Brother    Diabetes Brother    Sleep apnea Brother    Sleep apnea Son    Diabetes Son    Heart attack Son    Sudden Cardiac Death Son    Allergies  Allergen Reactions   Penicillins Nausea And Vomiting and Rash    Has patient had a PCN reaction causing immediate rash, facial/tongue/throat swelling, SOB or lightheadedness with hypotension: Unknown Has patient had a PCN reaction causing severe rash involving mucus membranes or skin necrosis: Unknown Has patient had a PCN reaction that required hospitalization: Unknown Has patient had a PCN reaction occurring within the last 10 years: No If all of the above answers are NO, then may proceed with Cephalosporin use.    Current Outpatient Medications on File Prior to Visit  Medication Sig Dispense Refill   cetirizine (ZYRTEC) 10 MG tablet Take 10 mg by mouth daily as needed for allergies or rhinitis.      empagliflozin  (JARDIANCE ) 25 MG TABS tablet Take 1 tablet (25 mg total) by mouth daily before breakfast. 90 tablet 3   fenofibrate  (TRICOR ) 145 MG tablet Take 1 tablet (145 mg total) by mouth daily. 90 tablet 3   glucose blood (ONETOUCH VERIO) test strip USE 1 STRIP TO CHECK GLUCOSE 3 times DAILY 300 each 2   HUMALOG  KWIKPEN 100 UNIT/ML KwikPen INJECT 4 UNITS SUBCUTANEOUSLY WITH BREAKFAST AND 6 UNITS WITH SUPPER BEFORE MEALS 15 mL 3   hydrochlorothiazide  (HYDRODIURIL ) 25 MG tablet Take 1/2 (one-half) tablet by mouth once daily 45 tablet 0   imipramine  (TOFRANIL ) 50 MG tablet TAKE 1 TABLET(50 MG) BY MOUTH AT BEDTIME  90 tablet 1   LANTUS  SOLOSTAR 100 UNIT/ML Solostar Pen INJECT 30 UNITS SUBCUTANEOUSLY ONCE DAILY 15 mL 4   levothyroxine  (SYNTHROID ) 88 MCG tablet TAKE 1 TABLET BY MOUTH DAILY BEFORE BREAKFAST 90 tablet 3   lisinopril  (ZESTRIL ) 20 MG tablet Take 1 tablet by mouth once daily 90 tablet 0   metFORMIN  (GLUCOPHAGE -XR) 750 MG 24 hr tablet Take 1 tablet (750 mg total) by mouth 2 (two) times daily. 180 tablet 3   methylphenidate  (RITALIN ) 20 MG tablet Take 0.5 tablets (10 mg total) by mouth 3 (three) times daily with meals. 45 tablet 0   OneTouch Delica Lancets 30G MISC USE 1  TO CHECK GLUCOSE ONCE DAILY 100 each 0   polyethylene glycol (MIRALAX  / GLYCOLAX ) packet Take 17 g by mouth daily as needed. 14 each 0   Probiotic Product (ALIGN) 4 MG CAPS Take 1 capsule by mouth daily.     rosuvastatin  (CRESTOR ) 20 MG tablet Take 1 tablet by mouth once daily 90 tablet 0   No current facility-administered medications on file prior to visit.    Review of Systems  Constitutional:  Negative for activity change, appetite change, fatigue, fever and unexpected weight change.  HENT:  Negative for congestion, ear pain, rhinorrhea, sinus pressure and sore throat.   Eyes:  Negative for pain, redness and visual disturbance.  Respiratory:  Negative for cough, shortness of breath and wheezing.   Cardiovascular:  Negative for chest pain and palpitations.  Gastrointestinal:  Negative for abdominal pain, blood in stool, constipation and diarrhea.  Endocrine: Negative for polydipsia and polyuria.  Genitourinary:  Negative for dysuria, frequency and urgency.  Musculoskeletal:  Negative for arthralgias, back pain and myalgias.  Skin:  Negative for pallor and rash.  Allergic/Immunologic: Negative for environmental allergies.  Neurological:  Negative for dizziness, syncope and headaches.  Hematological:  Negative for adenopathy.  Does not bruise/bleed easily.  Psychiatric/Behavioral:  Negative for decreased concentration and  dysphoric mood. The patient is not nervous/anxious.        Objective:   Physical Exam Constitutional:      General: She is not in acute distress.    Appearance: Normal appearance. She is well-developed. She is obese. She is not ill-appearing or diaphoretic.     Comments: Central obesity   HENT:     Head: Normocephalic and atraumatic.     Right Ear: Tympanic membrane, ear canal and external ear normal.     Left Ear: Tympanic membrane, ear canal and external ear normal.     Nose: Nose normal. No congestion.     Mouth/Throat:     Mouth: Mucous membranes are moist.     Pharynx: Oropharynx is clear. No posterior oropharyngeal erythema.  Eyes:     General: No scleral icterus.    Extraocular Movements: Extraocular movements intact.     Conjunctiva/sclera: Conjunctivae normal.     Pupils: Pupils are equal, round, and reactive to light.  Neck:     Thyroid : No thyromegaly.     Vascular: No carotid bruit or JVD.  Cardiovascular:     Rate and Rhythm: Normal rate and regular rhythm.     Pulses: Normal pulses.     Heart sounds: Normal heart sounds.     No gallop.  Pulmonary:     Effort: Pulmonary effort is normal. No respiratory distress.     Breath sounds: Normal breath sounds. No wheezing.     Comments: Good air exch Chest:     Chest wall: No tenderness.  Abdominal:     General: Bowel sounds are normal. There is no distension or abdominal bruit.     Palpations: Abdomen is soft. There is no mass.     Tenderness: There is no abdominal tenderness.     Hernia: No hernia is present.  Genitourinary:    Comments: Breast exam: No mass, nodules, thickening, tenderness, bulging, retraction, inflamation, nipple discharge or skin changes noted.  No axillary or clavicular LA.     Musculoskeletal:        General: No tenderness. Normal range of motion.     Cervical back: Normal range of motion and neck supple. No rigidity. No muscular tenderness.     Right lower leg: No edema.     Left lower  leg: No edema.     Comments: No kyphosis   Lymphadenopathy:     Cervical: No cervical adenopathy.  Skin:    General: Skin is warm and dry.     Coloration: Skin is not pale.     Findings: No erythema or rash.     Comments: Solar lentigines diffusely Some angiomas   Neurological:     Mental Status: She is alert. Mental status is at baseline.     Cranial Nerves: No cranial nerve deficit.     Motor: No abnormal muscle tone.     Coordination: Coordination normal.     Gait: Gait normal.     Deep Tendon Reflexes: Reflexes are normal and symmetric. Reflexes normal.  Psychiatric:        Mood and Affect: Mood normal.        Cognition and Memory: Cognition and memory normal.           Assessment & Plan:   Problem List Items Addressed This Visit       Cardiovascular and Mediastinum   Essential hypertension   bp in fair  control at this time  BP Readings from Last 1 Encounters:  03/07/24 124/80   No changes needed Most recent labs reviewed  Disc lifstyle change with low sodium diet and exercise  Plan to continue lisinopril  20 mg daily  hctz 12.5 mg daily   Last GFR 50 in may  Lab today       Relevant Orders   CBC with Differential/Platelet   TSH   Lipid Panel   Comprehensive metabolic panel with GFR     Respiratory   Allergic rhinitis   Some congestion in season  Saline irrigations helped   Has flonase-encouraged daily use in season         Endocrine   Hypothyroidism   TSH today  No clinical changes On levothyroxine  88 mcg daily         Relevant Orders   TSH   Hyperlipidemia associated with type 2 diabetes mellitus (HCC)   Disc goals for lipids and reasons to control them Rev last labs with pt Rev low sat fat diet in detail   Currently taking Crestor  20 mg daily  Tricor  145 mg daily   Labs ordered       Relevant Orders   Lipid Panel     Other   Routine general medical examination at a health care facility - Primary   Reviewed health habits  including diet and exercise and skin cancer prevention Reviewed appropriate screening tests for age  Also reviewed health mt list, fam hx and immunization status , as well as social and family history   See HPI Labs reviewed and ordered Health Maintenance  Topic Date Due   Breast Cancer Screening  10/12/2014   Eye exam for diabetics  01/08/2024   COVID-19 Vaccine (3 - 2025-26 season) 03/23/2026*   Yearly kidney health urinalysis for diabetes  07/08/2024   Hemoglobin A1C  08/10/2024   Medicare Annual Wellness Visit  09/28/2024   Yearly kidney function blood test for diabetes  10/07/2024   Complete foot exam   01/17/2025   DTaP/Tdap/Td vaccine (4 - Td or Tdap) 10/15/2033   Pneumococcal Vaccine for age over 82  Completed   Flu Shot  Completed   DEXA scan (bone density measurement)  Completed   Zoster (Shingles) Vaccine  Completed   HPV Vaccine  Aged Out   Meningitis B Vaccine  Aged Out  *Topic was postponed. The date shown is not the original due date.    Pt plans to schedule DM eye exam at [facility] Declines dexa  No falls or fractures Discussed fall prevention, supplements and exercise for bone density  PHQ 0        Encounter for screening mammogram for breast cancer   Mammogram ordered at [place] Pt will call to schedule       Relevant Orders   MM 3D SCREENING MAMMOGRAM BILATERAL BREAST

## 2024-03-07 NOTE — Assessment & Plan Note (Signed)
 bp in fair control at this time  BP Readings from Last 1 Encounters:  03/07/24 124/80   No changes needed Most recent labs reviewed  Disc lifstyle change with low sodium diet and exercise  Plan to continue lisinopril  20 mg daily  hctz 12.5 mg daily   Last GFR 50 in may  Lab today

## 2024-03-07 NOTE — Assessment & Plan Note (Signed)
 Reviewed health habits including diet and exercise and skin cancer prevention Reviewed appropriate screening tests for age  Also reviewed health mt list, fam hx and immunization status , as well as social and family history   See HPI Labs reviewed and ordered Health Maintenance  Topic Date Due   Breast Cancer Screening  10/12/2014   Eye exam for diabetics  01/08/2024   COVID-19 Vaccine (3 - 2025-26 season) 03/23/2026*   Yearly kidney health urinalysis for diabetes  07/08/2024   Hemoglobin A1C  08/10/2024   Medicare Annual Wellness Visit  09/28/2024   Yearly kidney function blood test for diabetes  10/07/2024   Complete foot exam   01/17/2025   DTaP/Tdap/Td vaccine (4 - Td or Tdap) 10/15/2033   Pneumococcal Vaccine for age over 64  Completed   Flu Shot  Completed   DEXA scan (bone density measurement)  Completed   Zoster (Shingles) Vaccine  Completed   HPV Vaccine  Aged Out   Meningitis B Vaccine  Aged Out  *Topic was postponed. The date shown is not the original due date.    Pt plans to schedule DM eye exam at [facility] Declines dexa  No falls or fractures Discussed fall prevention, supplements and exercise for bone density  PHQ 0

## 2024-03-07 NOTE — Assessment & Plan Note (Signed)
 TSH today  No clinical changes On levothyroxine  88 mcg daily

## 2024-03-07 NOTE — Assessment & Plan Note (Signed)
 Per pt control has been better with A1c 7.6 Lab Results  Component Value Date   HGBA1C 7.6 (A) 02/11/2024   HGBA1C 7.6 (A) 01/18/2024   HGBA1C 8.3 (H) 10/08/2023   Under endo care Jardiance , insulin  , metformin   Eating better-commended Plans to schedule her DM eye exam at [facility]

## 2024-03-08 ENCOUNTER — Ambulatory Visit: Payer: Self-pay | Admitting: Family Medicine

## 2024-03-08 LAB — TSH: TSH: 3.67 u[IU]/mL (ref 0.35–5.50)

## 2024-03-10 ENCOUNTER — Other Ambulatory Visit: Payer: Self-pay | Admitting: Adult Health

## 2024-03-10 ENCOUNTER — Other Ambulatory Visit: Payer: Self-pay

## 2024-03-10 DIAGNOSIS — M1712 Unilateral primary osteoarthritis, left knee: Secondary | ICD-10-CM

## 2024-03-10 DIAGNOSIS — G4710 Hypersomnia, unspecified: Secondary | ICD-10-CM

## 2024-03-10 MED ORDER — METHYLPHENIDATE HCL 20 MG PO TABS: 10.0000 mg | ORAL_TABLET | Freq: Three times a day (TID) | ORAL | 0 refills | Status: DC

## 2024-03-10 NOTE — Telephone Encounter (Signed)
 Pt called needing a refill request for her methylphenidate  (RITALIN ) 20 MG tablet to be sent to the [facility] on [address]

## 2024-03-10 NOTE — Telephone Encounter (Signed)
VOB has been submitted  

## 2024-03-10 NOTE — Telephone Encounter (Signed)
 Last seen 01-11-2024, last fill 01-29-2024 #45, next appt 07-18-2024.

## 2024-03-11 ENCOUNTER — Other Ambulatory Visit: Payer: Self-pay | Admitting: Family Medicine

## 2024-03-15 ENCOUNTER — Encounter: Payer: Self-pay | Admitting: Physician Assistant

## 2024-03-15 ENCOUNTER — Ambulatory Visit: Admitting: Physician Assistant

## 2024-03-15 DIAGNOSIS — M1712 Unilateral primary osteoarthritis, left knee: Secondary | ICD-10-CM

## 2024-03-15 MED ORDER — HYALURONAN 30 MG/2ML IX SOSY
30.0000 mg | PREFILLED_SYRINGE | INTRA_ARTICULAR | Status: AC | PRN
  Administered 2024-03-15: 30 mg via INTRA_ARTICULAR

## 2024-03-15 NOTE — Progress Notes (Signed)
 Office Visit Note   Patient: Amanda King           Date of Birth: 06-23-1940           MRN: [id number] Visit Date: 03/15/2024              Requested by: Tower, Amanda LABOR, MD [address] [place],  [place] [address] PCP: Amanda Amanda LABOR, MD  Chief Complaint  Patient presents with  . Left Knee - Follow-up      HPI: Amanda King is a pleasant 83 year old woman who comes in for her first Orthovisc injection into her left knee she has a history of osteoarthritis.  Her hemoglobin A1c is elevated so more appropriate to try viscosupplementation versus steroid  Assessment & Plan: Visit Diagnoses:  1. Unilateral primary osteoarthritis, left knee     Plan: [name] forward with an injection today without any difficulty discussed the side effects that could happen and what to do with it does including reactive synovitis redness swelling and pain in the knee should contact our office  Follow-Up Instructions: Return if symptoms worsen or fail to improve.   Ortho Exam  Patient is alert, oriented, no adenopathy, well-dressed, normal affect, normal respiratory effort. Left knee no effusion no erythema compartments are soft and compressible she is neurovascular intact    Imaging: No results found. No images are attached to the encounter.  Labs: Lab Results  Component Value Date   HGBA1C 7.6 (A) 02/11/2024   HGBA1C 7.6 (A) 01/18/2024   HGBA1C 8.3 (H) 10/08/2023   ESRSEDRATE 16 07/20/2017   REPTSTATUS 05/22/2018 FINAL 05/17/2018   CULT  05/17/2018    NO GROWTH 5 DAYS Performed at [facility], [address], [place], [place] [address]    LABORGA NO GROWTH 11/24/2011     Lab Results  Component Value Date   ALBUMIN 4.6 03/07/2024   ALBUMIN 4.4 03/05/2023   ALBUMIN 4.7 11/12/2021    Lab Results  Component Value Date   MG 1.7 05/19/2018   No results found for: VD25OH  No results found for: PREALBUMIN    Latest Ref Rng & Units 03/07/2024   11:30 AM 03/05/2023    11:38 AM 01/03/2022    4:15 PM  CBC EXTENDED  WBC 4.0 - 10.5 K/uL 7.7  7.4  9.0   RBC 3.87 - 5.11 Mil/uL 4.51  4.69  4.88   Hemoglobin 12.0 - 15.0 g/dL 86.8  86.2  85.3   HCT 36.0 - 46.0 % 40.1  42.1  42.1   Platelets 150.0 - 400.0 K/uL 316.0  293.0  338   NEUT# 1.4 - 7.7 K/uL 4.5  4.6  5,229   Lymph# 0.7 - 4.0 K/uL 2.3  2.0  2,979      There is no height or weight on file to calculate BMI.  Orders:  No orders of the defined types were placed in this encounter.  No orders of the defined types were placed in this encounter.    Procedures: Large Joint Inj: L knee on 03/15/2024 1:22 PM Indications: pain and diagnostic evaluation Details: 22 G 1.5 in needle, anteromedial approach  Arthrogram: No  Medications: 30 mg Hyaluronan 30 MG/2ML Outcome: tolerated well, no immediate complications Procedure, treatment alternatives, risks and benefits explained, specific risks discussed. Consent was given by the patient. Immediately prior to procedure a time out was called to verify the correct patient, procedure, equipment, support staff and site/side marked as required. Patient was prepped and  draped in the usual sterile fashion.     Clinical Data: No additional findings.  ROS:  All other systems negative, except as noted in the HPI. Review of Systems  Objective: Vital Signs: There were no vitals taken for this visit.  Specialty Comments:  No specialty comments available.  PMFS History: Patient Active Problem List   Diagnosis Date Noted  . Pedal edema 12/24/2023  . Unilateral primary osteoarthritis, left knee 06/30/2023  . OSA on CPAP 01/05/2023  . At risk for obstructive sleep apnea 08/05/2022  . Allergic rhinitis 04/21/2022  . Encounter for screening mammogram for breast cancer 01/03/2022  . Grief reaction 15-Feb-2021  . Family history of sudden cardiac death 15-Feb-2021  . Localized swelling, mass and lump, neck Feb 15, 2021  . History of ischemic colitis   . Routine  general medical examination at a health care facility 01/31/2016  . Narcolepsy with cataplexy 10/31/2014  . Encounter for Medicare annual wellness exam 09/22/2013  . Hypersomnia, persistent 07/15/2013  . ANXIETY 05/06/2010  . DERMATOPHYTOSIS OF NAIL 12/21/2008  . Hypothyroidism 09/02/2006  . Diabetes type 2, uncontrolled 09/02/2006  . Hyperlipidemia associated with type 2 diabetes mellitus (HCC) 09/02/2006  . NARCOLEPSY W/CATAPLEXY 09/02/2006  . Essential hypertension 09/02/2006  . PNEUMONIA, HX OF 09/02/2006   Past Medical History:  Diagnosis Date  . History of ischemic colitis 05/2018  . Hyperlipidemia, mixed   . Hypersomnia, persistent   . Hypertension   . Hypothyroidism    endocrinologist--- dr von  . Migraine   . Narcolepsy with cataplexy    followed by neurologist--- dr dohmeier  . Subcutaneous mass    left shoulder  . Type 2 diabetes mellitus treated with insulin  [facility])    endocrinologist--- dr a. kumar   (12-02-2021  per pt check blood sugar daily 2 hours after evening meal)  . Wears glasses   . Wears hearing aid in both ears     Family History  Problem Relation Age of Onset  . Heart disease Mother        MI  . Peripheral vascular disease Mother   . Hyperlipidemia Mother   . Depression Brother        suicide  . Diabetes Brother   . Diabetes Brother   . Sleep apnea Brother   . Sleep apnea Son   . Diabetes Son   . Heart attack Son   . Sudden Cardiac Death Son     Past Surgical History:  Procedure Laterality Date  . APPENDECTOMY  1948  . BIOPSY  05/24/2018   Procedure: BIOPSY;  Surgeon: Wilhelmenia Aloha Raddle., MD;  Location: [facility] ENDOSCOPY;  Service: Gastroenterology;;  . COLONOSCOPY N/A 05/24/2018   Procedure: COLONOSCOPY;  Surgeon: Wilhelmenia Aloha Raddle., MD;  Location: [facility] ENDOSCOPY;  Service: Gastroenterology;  Laterality: N/A;  . MASS EXCISION Left 12/06/2021   Procedure: LEFT SHOULDER SUBCUTANEOUS MASS EXCISION;  Surgeon: Lyndel Deward PARAS, MD;   Location: [facility];  Service: General;  Laterality: Left;  . POSTERIOR CERVICAL FUSION/FORAMINOTOMY  16-Feb-1975   C5-C6-C7  . VAGINAL HYSTERECTOMY  1971   Social History   Occupational History  . Occupation: works 1 day a week  Tobacco Use  . Smoking status: Former    Current packs/day: 0.00    Types: Cigarettes    Start date: 87    Quit date: 1982    Years since quitting: 43.7  . Smokeless tobacco: Never  Vaping Use  . Vaping status: Never Used  Substance and Sexual Activity  .  Alcohol use: No    Alcohol/week: 0.0 standard drinks of alcohol  . Drug use: Never  . Sexual activity: Not on file

## 2024-03-22 ENCOUNTER — Ambulatory Visit: Admitting: Physician Assistant

## 2024-03-22 DIAGNOSIS — M1712 Unilateral primary osteoarthritis, left knee: Secondary | ICD-10-CM | POA: Diagnosis not present

## 2024-03-22 MED ORDER — HYALURONAN 30 MG/2ML IX SOSY
30.0000 mg | PREFILLED_SYRINGE | INTRA_ARTICULAR | Status: AC | PRN
  Administered 2024-03-22: 30 mg via INTRA_ARTICULAR

## 2024-03-22 NOTE — Progress Notes (Signed)
 Office Visit Note   Patient: Amanda King           Date of Birth: 18-May-1941           MRN: [id number] Visit Date: 03/22/2024              Requested by: Tower, Laine LABOR, MD [address] [place],  [place] [address] PCP: Randeen Laine LABOR, MD      HPI: Patient is a pleasant 83 year old woman comes in today for her second Orthovisc injection into her left knee has tolerated the previous well  Assessment & Plan: Visit Diagnoses:  1. Unilateral primary osteoarthritis, left knee     Plan: Injected without difficulty will follow-up in 1 week for final injection  Follow-Up Instructions: Return in about 1 week (around 03/29/2024).   Ortho Exam  Patient is alert, oriented, no adenopathy, well-dressed, normal affect, normal respiratory effort. Left knee no erythema no effusion compartments are soft and compressible she is neuro vastly intact    Imaging: No results found. No images are attached to the encounter.  Labs: Lab Results  Component Value Date   HGBA1C 7.6 (A) 02/11/2024   HGBA1C 7.6 (A) 01/18/2024   HGBA1C 8.3 (H) 10/08/2023   ESRSEDRATE 16 07/20/2017   REPTSTATUS 05/22/2018 FINAL 05/17/2018   CULT  05/17/2018    NO GROWTH 5 DAYS Performed at [facility], [address], [place], [place] [address]    LABORGA NO GROWTH 11/24/2011     Lab Results  Component Value Date   ALBUMIN 4.6 03/07/2024   ALBUMIN 4.4 03/05/2023   ALBUMIN 4.7 11/12/2021    Lab Results  Component Value Date   MG 1.7 05/19/2018   No results found for: VD25OH  No results found for: PREALBUMIN    Latest Ref Rng & Units 03/07/2024   11:30 AM 03/05/2023   11:38 AM 01/03/2022    4:15 PM  CBC EXTENDED  WBC 4.0 - 10.5 K/uL 7.7  7.4  9.0   RBC 3.87 - 5.11 Mil/uL 4.51  4.69  4.88   Hemoglobin 12.0 - 15.0 g/dL 86.8  86.2  85.3   HCT 36.0 - 46.0 % 40.1  42.1  42.1   Platelets 150.0 - 400.0 K/uL 316.0  293.0  338   NEUT# 1.4 - 7.7 K/uL 4.5  4.6  5,229   Lymph# 0.7 -  4.0 K/uL 2.3  2.0  2,979      There is no height or weight on file to calculate BMI.  Orders:  No orders of the defined types were placed in this encounter.  No orders of the defined types were placed in this encounter.    Procedures: Large Joint Inj on 03/22/2024 10:04 AM Indications: pain and diagnostic evaluation Details: 1.5 in  Arthrogram: No  Medications: 30 mg Hyaluronan 30 MG/2ML Outcome: tolerated well, no immediate complications Procedure, treatment alternatives, risks and benefits explained, specific risks discussed. Consent was given by the patient. Immediately prior to procedure a time out was called to verify the correct patient, procedure, equipment, support staff and site/side marked as required. Patient was prepped and draped in the usual sterile fashion.     Clinical Data: No additional findings.  ROS:  All other systems negative, except as noted in the HPI. Review of Systems  Objective: Vital Signs: There were no vitals taken for this visit.  Specialty Comments:  No specialty comments available.  PMFS History: Patient Active Problem List   Diagnosis  Date Noted  . Pedal edema 12/24/2023  . Unilateral primary osteoarthritis, left knee 06/30/2023  . OSA on CPAP 01/05/2023  . At risk for obstructive sleep apnea 08/05/2022  . Allergic rhinitis 04/21/2022  . Encounter for screening mammogram for breast cancer 01/03/2022  . Grief reaction 03-06-21  . Family history of sudden cardiac death 06-Mar-2021  . Localized swelling, mass and lump, neck 2021/03/06  . History of ischemic colitis   . Routine general medical examination at a health care facility 01/31/2016  . Narcolepsy with cataplexy 10/31/2014  . Encounter for Medicare annual wellness exam 09/22/2013  . Hypersomnia, persistent 07/15/2013  . ANXIETY 05/06/2010  . DERMATOPHYTOSIS OF NAIL 12/21/2008  . Hypothyroidism 09/02/2006  . Diabetes type 2, uncontrolled 09/02/2006  . Hyperlipidemia  associated with type 2 diabetes mellitus (HCC) 09/02/2006  . NARCOLEPSY W/CATAPLEXY 09/02/2006  . Essential hypertension 09/02/2006  . PNEUMONIA, HX OF 09/02/2006   Past Medical History:  Diagnosis Date  . History of ischemic colitis 05/2018  . Hyperlipidemia, mixed   . Hypersomnia, persistent   . Hypertension   . Hypothyroidism    endocrinologist--- dr von  . Migraine   . Narcolepsy with cataplexy    followed by neurologist--- dr dohmeier  . Subcutaneous mass    left shoulder  . Type 2 diabetes mellitus treated with insulin  [facility])    endocrinologist--- dr a. kumar   (12-02-2021  per pt check blood sugar daily 2 hours after evening meal)  . Wears glasses   . Wears hearing aid in both ears     Family History  Problem Relation Age of Onset  . Heart disease Mother        MI  . Peripheral vascular disease Mother   . Hyperlipidemia Mother   . Depression Brother        suicide  . Diabetes Brother   . Diabetes Brother   . Sleep apnea Brother   . Sleep apnea Son   . Diabetes Son   . Heart attack Son   . Sudden Cardiac Death Son     Past Surgical History:  Procedure Laterality Date  . APPENDECTOMY  1948  . BIOPSY  05/24/2018   Procedure: BIOPSY;  Surgeon: Wilhelmenia Aloha Raddle., MD;  Location: [facility] ENDOSCOPY;  Service: Gastroenterology;;  . COLONOSCOPY N/A 05/24/2018   Procedure: COLONOSCOPY;  Surgeon: Wilhelmenia Aloha Raddle., MD;  Location[facility]COPY;  Service: Gastroenterology;  Laterality: N/A;  . MASS EXCISION Left 12/06/2021   Procedure: LEFT SHOULDER SUBCUTANEOUS MASS EXCISION;  Surgeon: Lyndel Deward PARAS, MD;  Location: [facility];  Service: General;  Laterality: Left;  . POSTERIOR CERVICAL FUSION/FORAMINOTOMY  03/07/75   C5-C6-C7  . VAGINAL HYSTERECTOMY  1971   Social History   Occupational History  . Occupation: works 1 day a week  Tobacco Use  . Smoking status: Former    Current packs/day: 0.00    Types: Cigarettes    Start date:  83    Quit date: 1982    Years since quitting: 43.8  . Smokeless tobacco: Never  Vaping Use  . Vaping status: Never Used  Substance and Sexual Activity  . Alcohol use: No    Alcohol/week: 0.0 standard drinks of alcohol  . Drug use: Never  . Sexual activity: Not on file

## 2024-03-29 ENCOUNTER — Ambulatory Visit: Admitting: Physician Assistant

## 2024-03-29 DIAGNOSIS — M1712 Unilateral primary osteoarthritis, left knee: Secondary | ICD-10-CM

## 2024-03-29 MED ORDER — HYALURONAN 30 MG/2ML IX SOSY
30.0000 mg | PREFILLED_SYRINGE | INTRA_ARTICULAR | Status: AC | PRN
  Administered 2024-03-29: 30 mg via INTRA_ARTICULAR

## 2024-03-29 NOTE — Progress Notes (Signed)
 Office Visit Note   Patient: Amanda King           Date of Birth: August 19, 1940           MRN: [id number] Visit Date: 03/29/2024              Requested by: Tower, Laine LABOR, MD [address] [place],  [place] [address] PCP: Randeen Laine LABOR, MD  Chief Complaint  Patient presents with  . Left Knee - Follow-up      HPI: Patient is an 83 year old woman comes in today for her third Orthovisc injection has tolerated the previous 2 injections well thinks it is helping her knee  Assessment & Plan: Visit Diagnoses:  1. Unilateral primary osteoarthritis, left knee     Plan: Third Orthovisc injection completed without difficulty she may follow-up as needed  Follow-Up Instructions: Return if symptoms worsen or fail to improve.   Ortho Exam  Patient is alert, oriented, no adenopathy, well-dressed, normal affect, normal respiratory effort. Left knee no erythema no effusion compartments are soft and compressible negative Homans' sign no swelling    Imaging: No results found. No images are attached to the encounter.  Labs: Lab Results  Component Value Date   HGBA1C 7.6 (A) 02/11/2024   HGBA1C 7.6 (A) 01/18/2024   HGBA1C 8.3 (H) 10/08/2023   ESRSEDRATE 16 07/20/2017   REPTSTATUS 05/22/2018 FINAL 05/17/2018   CULT  05/17/2018    NO GROWTH 5 DAYS Performed at [facility], [address], [place], [place] [address]    LABORGA NO GROWTH 11/24/2011     Lab Results  Component Value Date   ALBUMIN 4.6 03/07/2024   ALBUMIN 4.4 03/05/2023   ALBUMIN 4.7 11/12/2021    Lab Results  Component Value Date   MG 1.7 05/19/2018   No results found for: VD25OH  No results found for: PREALBUMIN    Latest Ref Rng & Units 03/07/2024   11:30 AM 03/05/2023   11:38 AM 01/03/2022    4:15 PM  CBC EXTENDED  WBC 4.0 - 10.5 K/uL 7.7  7.4  9.0   RBC 3.87 - 5.11 Mil/uL 4.51  4.69  4.88   Hemoglobin 12.0 - 15.0 g/dL 86.8  86.2  85.3   HCT 36.0 - 46.0 % 40.1  42.1  42.1    Platelets 150.0 - 400.0 K/uL 316.0  293.0  338   NEUT# 1.4 - 7.7 K/uL 4.5  4.6  5,229   Lymph# 0.7 - 4.0 K/uL 2.3  2.0  2,979      There is no height or weight on file to calculate BMI.  Orders:  No orders of the defined types were placed in this encounter.  No orders of the defined types were placed in this encounter.    Procedures: Large Joint Inj: L knee on 03/29/2024 10:05 AM Indications: pain and diagnostic evaluation Details: 22 G 1.5 in needle, anteromedial approach  Arthrogram: No  Medications: 30 mg Hyaluronan 30 MG/2ML Outcome: tolerated well, no immediate complications Procedure, treatment alternatives, risks and benefits explained, specific risks discussed. Consent was given by the patient.     Clinical Data: No additional findings.  ROS:  All other systems negative, except as noted in the HPI. Review of Systems  Objective: Vital Signs: There were no vitals taken for this visit.  Specialty Comments:  No specialty comments available.  PMFS History: Patient Active Problem List   Diagnosis Date Noted  . Pedal edema 12/24/2023  .  Unilateral primary osteoarthritis, left knee 06/30/2023  . OSA on CPAP 01/05/2023  . At risk for obstructive sleep apnea 08/05/2022  . Allergic rhinitis 04/21/2022  . Encounter for screening mammogram for breast cancer 01/03/2022  . Grief reaction Mar 02, 2021  . Family history of sudden cardiac death 03/02/21  . Localized swelling, mass and lump, neck 03-02-2021  . History of ischemic colitis   . Routine general medical examination at a health care facility 01/31/2016  . Narcolepsy with cataplexy 10/31/2014  . Encounter for Medicare annual wellness exam 09/22/2013  . Hypersomnia, persistent 07/15/2013  . ANXIETY 05/06/2010  . DERMATOPHYTOSIS OF NAIL 12/21/2008  . Hypothyroidism 09/02/2006  . Diabetes type 2, uncontrolled 09/02/2006  . Hyperlipidemia associated with type 2 diabetes mellitus (HCC) 09/02/2006  .  NARCOLEPSY W/CATAPLEXY 09/02/2006  . Essential hypertension 09/02/2006  . PNEUMONIA, HX OF 09/02/2006   Past Medical History:  Diagnosis Date  . History of ischemic colitis 05/2018  . Hyperlipidemia, mixed   . Hypersomnia, persistent   . Hypertension   . Hypothyroidism    endocrinologist--- dr von  . Migraine   . Narcolepsy with cataplexy    followed by neurologist--- dr dohmeier  . Subcutaneous mass    left shoulder  . Type 2 diabetes mellitus treated with insulin  [facility])    endocrinologist--- dr a. kumar   (12-02-2021  per pt check blood sugar daily 2 hours after evening meal)  . Wears glasses   . Wears hearing aid in both ears     Family History  Problem Relation Age of Onset  . Heart disease Mother        MI  . Peripheral vascular disease Mother   . Hyperlipidemia Mother   . Depression Brother        suicide  . Diabetes Brother   . Diabetes Brother   . Sleep apnea Brother   . Sleep apnea Son   . Diabetes Son   . Heart attack Son   . Sudden Cardiac Death Son     Past Surgical History:  Procedure Laterality Date  . APPENDECTOMY  1948  . BIOPSY  05/24/2018   Procedure: BIOPSY;  Surgeon: Wilhelmenia Aloha Raddle., MD;  Location: [facility] ENDOSCOPY;  Service: Gastroenterology;;  . COLONOSCOPY N/A 05/24/2018   Procedure: COLONOSCOPY;  Surgeon: Wilhelmenia Aloha Raddle., MD;  Location: [facility] ENDOSCOPY;  Service: Gastroenterology;  Laterality: N/A;  . MASS EXCISION Left 12/06/2021   Procedure: LEFT SHOULDER SUBCUTANEOUS MASS EXCISION;  Surgeon: Lyndel Deward PARAS, MD;  Location: [facility];  Service: General;  Laterality: Left;  . POSTERIOR CERVICAL FUSION/FORAMINOTOMY  03/03/75   C5-C6-C7  . VAGINAL HYSTERECTOMY  1971   Social History   Occupational History  . Occupation: works 1 day a week  Tobacco Use  . Smoking status: Former    Current packs/day: 0.00    Types: Cigarettes    Start date: 85    Quit date: 1982    Years since quitting: 43.8  .  Smokeless tobacco: Never  Vaping Use  . Vaping status: Never Used  Substance and Sexual Activity  . Alcohol use: No    Alcohol/week: 0.0 standard drinks of alcohol  . Drug use: Never  . Sexual activity: Not on file

## 2024-04-07 ENCOUNTER — Other Ambulatory Visit: Payer: Self-pay | Admitting: Endocrinology

## 2024-04-07 DIAGNOSIS — E1165 Type 2 diabetes mellitus with hyperglycemia: Secondary | ICD-10-CM

## 2024-04-11 ENCOUNTER — Encounter: Payer: Self-pay | Admitting: Radiology

## 2024-04-21 ENCOUNTER — Other Ambulatory Visit: Payer: Self-pay | Admitting: Family Medicine

## 2024-04-23 ENCOUNTER — Other Ambulatory Visit: Payer: Self-pay | Admitting: Family Medicine

## 2024-05-16 ENCOUNTER — Other Ambulatory Visit: Payer: Self-pay | Admitting: Adult Health

## 2024-05-16 ENCOUNTER — Other Ambulatory Visit

## 2024-05-16 DIAGNOSIS — E1165 Type 2 diabetes mellitus with hyperglycemia: Secondary | ICD-10-CM | POA: Diagnosis not present

## 2024-05-16 DIAGNOSIS — G4710 Hypersomnia, unspecified: Secondary | ICD-10-CM

## 2024-05-16 DIAGNOSIS — Z794 Long term (current) use of insulin: Secondary | ICD-10-CM | POA: Diagnosis not present

## 2024-05-16 DIAGNOSIS — E782 Mixed hyperlipidemia: Secondary | ICD-10-CM | POA: Diagnosis not present

## 2024-05-16 DIAGNOSIS — E063 Autoimmune thyroiditis: Secondary | ICD-10-CM | POA: Diagnosis not present

## 2024-05-16 NOTE — Telephone Encounter (Signed)
 Pt called to request medication refill  methylphenidate  (RITALIN ) 20 MG tablet   Pt medication is to be sent to    [id number] - [place], [place] - [address] AT [facility] OF [address] (Ph: [phone])

## 2024-05-17 LAB — BASIC METABOLIC PANEL WITH GFR
BUN/Creatinine Ratio: 32 (calc) — ABNORMAL HIGH (ref 6–22)
BUN: 31 mg/dL — ABNORMAL HIGH (ref 7–25)
CO2: 32 mmol/L (ref 20–32)
Calcium: 9.6 mg/dL (ref 8.6–10.4)
Chloride: 99 mmol/L (ref 98–110)
Creat: 0.98 mg/dL — ABNORMAL HIGH (ref 0.60–0.95)
Glucose, Bld: 124 mg/dL — ABNORMAL HIGH (ref 65–99)
Potassium: 4.4 mmol/L (ref 3.5–5.3)
Sodium: 137 mmol/L (ref 135–146)
eGFR: 57 mL/min/1.73m2 — ABNORMAL LOW (ref >=60)

## 2024-05-17 LAB — T4, FREE: Free T4: 1.4 ng/dL (ref 0.8–1.8)

## 2024-05-17 LAB — LIPID PANEL
Cholesterol: 168 mg/dL (ref <200)
HDL: 61 mg/dL (ref >=50)
LDL Cholesterol (Calc): 76 mg/dL
Non-HDL Cholesterol (Calc): 107 mg/dL (ref <130)
Total CHOL/HDL Ratio: 2.8 (calc) (ref <5.0)
Triglycerides: 212 mg/dL — ABNORMAL HIGH (ref <150)

## 2024-05-17 LAB — HEMOGLOBIN A1C
Hgb A1c MFr Bld: 8 % — ABNORMAL HIGH (ref <5.7)
Mean Plasma Glucose: 183 mg/dL
eAG (mmol/L): 10.1 mmol/L

## 2024-05-17 LAB — MICROALBUMIN / CREATININE URINE RATIO
Creatinine, Urine: 54 mg/dL (ref 20–275)
Microalb Creat Ratio: 13 mg/g{creat} (ref <30)
Microalb, Ur: 0.7 mg/dL

## 2024-05-17 LAB — TSH: TSH: 5.53 m[IU]/L — ABNORMAL HIGH (ref 0.40–4.50)

## 2024-05-17 MED ORDER — METHYLPHENIDATE HCL 20 MG PO TABS: 10.0000 mg | ORAL_TABLET | Freq: Three times a day (TID) | ORAL | 0 refills | Status: DC

## 2024-05-19 ENCOUNTER — Encounter: Payer: Self-pay | Admitting: Endocrinology

## 2024-05-19 ENCOUNTER — Ambulatory Visit: Admitting: Endocrinology

## 2024-05-19 VITALS — BP 152/70 | Ht 60.75 in | Wt 168.0 lb

## 2024-05-19 DIAGNOSIS — Z794 Long term (current) use of insulin: Secondary | ICD-10-CM | POA: Diagnosis not present

## 2024-05-19 DIAGNOSIS — E063 Autoimmune thyroiditis: Secondary | ICD-10-CM | POA: Diagnosis not present

## 2024-05-19 DIAGNOSIS — E1165 Type 2 diabetes mellitus with hyperglycemia: Secondary | ICD-10-CM

## 2024-05-19 MED ORDER — CONTOUR TEST VI STRP: ORAL_STRIP | 12 refills | Status: AC

## 2024-05-19 MED ORDER — CONTOUR BLOOD GLUCOSE SYSTEM W/DEVICE KIT: PACK | 0 refills | Status: AC

## 2024-05-19 MED ORDER — METFORMIN HCL ER 750 MG PO TB24: 750.0000 mg | ORAL_TABLET | Freq: Two times a day (BID) | ORAL | 3 refills | Status: AC

## 2024-05-19 MED ORDER — HUMALOG KWIKPEN 100 UNIT/ML ~~LOC~~ SOPN: PEN_INJECTOR | SUBCUTANEOUS | 3 refills | Status: AC

## 2024-05-19 NOTE — Progress Notes (Signed)
 Outpatient Endocrinology Note Amanda Vandenboom, MD  05/19/2024  Patient's Name: Amanda King    DOB: 05-05-1941    MRN: [id number]                                                    REASON OF VISIT: Follow up of type 2 diabetes mellitus  PCP: Tower, Laine LABOR, MD  HISTORY OF PRESENT ILLNESS:   Amanda King is a 83 y.o. old female with past medical history listed below, is here for follow up for type 2 diabetes mellitus / hypothyroidism.   Pertinent Diabetes History: Patient was diagnosed with type 2 diabetes mellitus in 2008.  Patient has uncontrolled type 2 diabetes mellitus.  Chronic Diabetes Complications : Retinopathy: no. Last ophthalmology exam was done on annually, reportedly, following with ophthalmology regularly.  Nephropathy: CKD IIa, on ACE/ARB /lisinopril . Peripheral neuropathy: no Coronary artery disease: no Stroke: no  Relevant comorbidities and cardiovascular risk factors: Obesity: yes Body mass index is 32.01 kg/m.  Hypertension: Yes  Hyperlipidemia : Yes, on statin   Current / Home Diabetic regimen includes: Lantus  30 units in the evening. Humalog  4 units with breakfast and 6 units with supper. Metformin  extended release 750 mg 2 times a day. Jardiance  25 mg daily.  Prior diabetic medications: She had taken glipizide , Januvia  in the past.  Diarrhea with higher dose of metformin  in the past.  Glycemic data:   She has One Touch Verio flex glucometer.  She forgot to bring glucometer in the clinic today.  Denies hypoglycemia.    Hypoglycemia: Patient has no hypoglycemic episodes. Patient has hypoglycemia awareness.  Factors modifying glucose control: 1.  Diabetic diet assessment: 3 meals a day.  2.  Staying active or exercising: No formal exercise.  She will be active during daytime with grandkids.  3.  Medication compliance: compliant all of the time.  # Hypothyroidism -Currently taking levothyroxine  88 mcg daily since November 2022.  Interval  history  No glucose data to review.  Hemoglobin A1c slightly worsened to 8%.  She reports she has been eating extra carbohydrate during holiday time.  Recent lab results reviewed with relatively stable renal function.  Slightly elevated TSH.  She has been taking levothyroxine  88 mg daily.  Denies any new symptoms, overall feeling fair energy.  No constipation.  No numbness and tingling of feet.  No vision problem.  No other complaints today.   REVIEW OF SYSTEMS As per history of present illness.   PAST MEDICAL HISTORY: Past Medical History:  Diagnosis Date   History of ischemic colitis 05/2018   Hyperlipidemia, mixed    Hypersomnia, persistent    Hypertension    Hypothyroidism    endocrinologist--- dr von   Migraine    Narcolepsy with cataplexy    followed by neurologist--- dr dohmeier   Subcutaneous mass    left shoulder   Type 2 diabetes mellitus treated with insulin  [facility])    endocrinologist--- dr a. kumar   (12-02-2021  per pt check blood sugar daily 2 hours after evening meal)   Wears glasses    Wears hearing aid in both ears     PAST SURGICAL HISTORY: Past Surgical History:  Procedure Laterality Date   APPENDECTOMY  1948   BIOPSY  05/24/2018   Procedure: BIOPSY;  Surgeon: Wilhelmenia Aloha Raddle., MD;  Location: [facility]  ENDOSCOPY;  Service: Gastroenterology;;   COLONOSCOPY N/A 05/24/2018   Procedure: COLONOSCOPY;  Surgeon: Wilhelmenia Aloha Raddle., MD;  Location: [facility] ENDOSCOPY;  Service: Gastroenterology;  Laterality: N/A;   MASS EXCISION Left 12/06/2021   Procedure: LEFT SHOULDER SUBCUTANEOUS MASS EXCISION;  Surgeon: Lyndel Deward PARAS, MD;  Location: [facility];  Service: General;  Laterality: Left;   POSTERIOR CERVICAL FUSION/FORAMINOTOMY  02/05/1975   C5-C6-C7   VAGINAL HYSTERECTOMY  1971    ALLERGIES: Allergies  Allergen Reactions   Penicillins Nausea And Vomiting and Rash    Has patient had a PCN reaction causing immediate rash, facial/tongue/throat  swelling, SOB or lightheadedness with hypotension: Unknown Has patient had a PCN reaction causing severe rash involving mucus membranes or skin necrosis: Unknown Has patient had a PCN reaction that required hospitalization: Unknown Has patient had a PCN reaction occurring within the last 10 years: No If all of the above answers are NO, then may proceed with Cephalosporin use.     FAMILY HISTORY:  Family History  Problem Relation Age of Onset   Heart disease Mother        MI   Peripheral vascular disease Mother    Hyperlipidemia Mother    Depression Brother        suicide   Diabetes Brother    Diabetes Brother    Sleep apnea Brother    Sleep apnea Son    Diabetes Son    Heart attack Son    Sudden Cardiac Death Son     SOCIAL HISTORY: Social History   Socioeconomic History   Marital status: Widowed    Spouse name: Not on file   Number of children: 3   Years of education: GED   Highest education level: Not on file  Occupational History   Occupation: works 1 day a week  Tobacco Use   Smoking status: Former    Current packs/day: 0.00    Types: Cigarettes    Start date: 42    Quit date: 1982    Years since quitting: 43.9   Smokeless tobacco: Never  Vaping Use   Vaping status: Never Used  Substance and Sexual Activity   Alcohol use: No    Alcohol/week: 0.0 standard drinks of alcohol   Drug use: Never   Sexual activity: Not on file  Other Topics Concern   Not on file  Social History Narrative   Patient is widowed. Patient has three children.Patient is right-handed.Patient drinks two cups of caffeine daily.  Patient is retired.Patient has a GED.   Social Drivers of Health   Tobacco Use: Medium Risk (05/19/2024)   Patient History    Smoking Tobacco Use: Former    Smokeless Tobacco Use: Never    Passive Exposure: Not on file  Financial Resource Strain: Low Risk (09/29/2023)   Overall Financial Resource Strain (CARDIA)    Difficulty of Paying Living Expenses:  Not hard at all  Food Insecurity: No Food Insecurity (09/29/2023)   Hunger Vital Sign    Worried About Running Out of Food in the Last Year: Never true    Ran Out of Food in the Last Year: Never true  Transportation Needs: No Transportation Needs (09/29/2023)   PRAPARE - Administrator, Civil Service (Medical): No    Lack of Transportation (Non-Medical): No  Physical Activity: Inactive (09/29/2023)   Exercise Vital Sign    Days of Exercise per Week: 0 days    Minutes of Exercise per Session: 0 min  Stress:  No Stress Concern Present (09/29/2023)   [employer] of Occupational Health - Occupational Stress Questionnaire    Feeling of Stress : Not at all  Social Connections: Moderately Isolated (09/29/2023)   Social Connection and Isolation Panel    Frequency of Communication with Friends and Family: More than three times a week    Frequency of Social Gatherings with Friends and Family: More than three times a week    Attends Religious Services: More than 4 times per year    Active Member of [employer] or Organizations: No    Attends Banker Meetings: Never    Marital Status: Widowed  Depression (PHQ2-9): Low Risk (03/07/2024)   Depression (PHQ2-9)    PHQ-2 Score: 0  Alcohol Screen: Low Risk (09/29/2023)   Alcohol Screen    Last Alcohol Screening Score (AUDIT): 0  Housing: Unknown (09/29/2023)   Housing Stability Vital Sign    Unable to Pay for Housing in the Last Year: No    Number of Times Moved in the Last Year: Not on file    Homeless in the Last Year: No  Utilities: Not At Risk (09/29/2023)   AHC Utilities    Threatened with loss of utilities: No  Health Literacy: Adequate Health Literacy (09/29/2023)   B1300 Health Literacy    Frequency of need for help with medical instructions: Never    MEDICATIONS:  Current Outpatient Medications  Medication Sig Dispense Refill   Blood Glucose Monitoring Suppl (CONTOUR BLOOD GLUCOSE SYSTEM) w/Device KIT Test 3-4 times  daily  as needed DX E11.65 1 kit 0   cetirizine (ZYRTEC) 10 MG tablet Take 10 mg by mouth daily as needed for allergies or rhinitis.      fenofibrate  (TRICOR ) 145 MG tablet Take 1 tablet (145 mg total) by mouth daily. 90 tablet 3   glucose blood (CONTOUR TEST) test strip Test 3-4 times daily as needed Dx E11.65 100 each 12   hydrochlorothiazide  (HYDRODIURIL ) 25 MG tablet Take 1/2 (one-half) tablet by mouth once daily 45 tablet 1   imipramine  (TOFRANIL ) 50 MG tablet TAKE 1 TABLET(50 MG) BY MOUTH AT BEDTIME 90 tablet 1   LANTUS  SOLOSTAR 100 UNIT/ML Solostar Pen INJECT 30 UNITS SUBCUTANEOUSLY ONCE DAILY 15 mL 4   levothyroxine  (SYNTHROID ) 88 MCG tablet TAKE 1 TABLET BY MOUTH DAILY BEFORE BREAKFAST 90 tablet 3   lisinopril  (ZESTRIL ) 20 MG tablet Take 1 tablet by mouth once daily 90 tablet 0   methylphenidate  (RITALIN ) 20 MG tablet Take 0.5 tablets (10 mg total) by mouth 3 (three) times daily with meals. 45 tablet 0   OneTouch Delica Lancets 30G MISC USE 1  TO CHECK GLUCOSE ONCE DAILY 100 each 0   polyethylene glycol (MIRALAX  / GLYCOLAX ) packet Take 17 g by mouth daily as needed. 14 each 0   Probiotic Product (ALIGN) 4 MG CAPS Take 1 capsule by mouth daily.     rosuvastatin  (CRESTOR ) 20 MG tablet Take 1 tablet by mouth once daily 90 tablet 2   empagliflozin  (JARDIANCE ) 25 MG TABS tablet Take 1 tablet (25 mg total) by mouth daily before breakfast. 90 tablet 3   HUMALOG  KWIKPEN 100 UNIT/ML KwikPen INJECT 4 UNITS SUBCUTANEOUSLY WITH BREAKFAST AND 6 UNITS WITH SUPPER BEFORE MEALS 15 mL 3   metFORMIN  (GLUCOPHAGE -XR) 750 MG 24 hr tablet Take 1 tablet (750 mg total) by mouth 2 (two) times daily. 180 tablet 3   No current facility-administered medications for this visit.    PHYSICAL EXAM: Vitals:   05/19/24  1349  BP: (!) 152/70  Weight: 168 lb (76.2 kg)  Height: 5' 0.75 (1.543 m)     Body mass index is 32.01 kg/m.  Wt Readings from Last 3 Encounters:  05/19/24 168 lb (76.2 kg)  03/07/24 168 lb 4  oz (76.3 kg)  01/18/24 167 lb (75.8 kg)    General: Well developed, well nourished female in no apparent distress.  HEENT: AT/[place], no external lesions.  Eyes: Conjunctiva clear and no icterus. Neck: Neck supple  Lungs: Respirations not labored Neurologic: Alert, oriented, normal speech, DTR 2+ Extremities / Skin: Dry.  Psychiatric: Does not appear depressed or anxious  Diabetic Foot Exam - Simple   No data filed    LABS Reviewed Lab Results  Component Value Date   HGBA1C 8.0 (H) 05/16/2024   HGBA1C 7.6 (A) 02/11/2024   HGBA1C 7.6 (A) 01/18/2024   Lab Results  Component Value Date   FRUCTOSAMINE 309 (H) 06/18/2021   FRUCTOSAMINE 267 11/22/2018   FRUCTOSAMINE 349 (H) 08/18/2018   Lab Results  Component Value Date   CHOL 168 05/16/2024   HDL 61 05/16/2024   LDLCALC 76 05/16/2024   LDLDIRECT 110.0 02/12/2022   TRIG 212 (H) 05/16/2024   CHOLHDL 2.8 05/16/2024   Lab Results  Component Value Date   MICRALBCREAT 13 05/16/2024   MICRALBCREAT 6 07/09/2023   Lab Results  Component Value Date   CREATININE 0.98 (H) 05/16/2024   Lab Results  Component Value Date   GFR 55.78 (L) 03/07/2024    ASSESSMENT / PLAN  1. Uncontrolled type 2 diabetes mellitus with hyperglycemia, with long-term current use of insulin  (HCC)   2. Acquired autoimmune hypothyroidism     Diabetes Mellitus type 2, complicated by CKD. - Diabetic status / severity: Uncontrolled.    Lab Results  Component Value Date   HGBA1C 8.0 (H) 05/16/2024    - Hemoglobin A1c goal : <7.5%  Slightly worsening diabetes control probably related to high carb meals during holiday time.  No glucose data to review.  No change in diabetes regimen.  - Medications: See below.  No change.  I) continue lantus  30 units daily. II) continue Humalog  4 units with breakfast and 6 units supper. III) continue metformin  750mg  two times a day. IV) continue Jardiance  25 mg daily.  - Home glucose testing: With meals and  bedtime 3-4 times a day.    - Discussed/ Gave Hypoglycemia treatment plan.  # Consult : not required at this time.   # Annual urine for microalbuminuria/ creatinine ratio, no microalbuminuria currently, continue ACE/ARB /lisinopril  /Jardiance .   Last  Lab Results  Component Value Date   MICRALBCREAT 13 05/16/2024    # Foot check nightly / neuropathy.  # Annual dilated diabetic eye exams.   - Diet: Make healthy diabetic food choices - Life style / activity / exercise: Discussed.  2. Blood pressure  -  BP Readings from Last 1 Encounters:  05/19/24 (!) 152/70    - Control is in target.  - No change in current plans.  3. Lipid status / Hyperlipidemia - Last  Lab Results  Component Value Date   LDLCALC 76 05/16/2024   - Continue rosuvastatin  20 mg daily.  # Hypothyroidism -Mildly elevated TSH.  She is clinically euthyroid.  Mild elevation of TSH is appropriate for age.  Will not change the dose of levothyroxine . -Continue levothyroxine  88 mcg daily.  Annual thyroid  lab.  Abeni was seen today for diabetes.  Diagnoses and all orders for this  visit:  Uncontrolled type 2 diabetes mellitus with hyperglycemia, with long-term current use of insulin  (HCC) -     Blood Glucose Monitoring Suppl (CONTOUR BLOOD GLUCOSE SYSTEM) w/Device KIT; Test 3-4 times daily  as needed DX E11.65 -     glucose blood (CONTOUR TEST) test strip; Test 3-4 times daily as needed Dx E11.65 -     Hemoglobin A1c -     Basic metabolic panel with GFR -     metFORMIN  (GLUCOPHAGE -XR) 750 MG 24 hr tablet; Take 1 tablet (750 mg total) by mouth 2 (two) times daily. -     empagliflozin  (JARDIANCE ) 25 MG TABS tablet; Take 1 tablet (25 mg total) by mouth daily before breakfast. -     HUMALOG  KWIKPEN 100 UNIT/ML KwikPen; INJECT 4 UNITS SUBCUTANEOUSLY WITH BREAKFAST AND 6 UNITS WITH SUPPER BEFORE MEALS  Acquired autoimmune hypothyroidism -     T4, free -     TSH     DISPOSITION Follow up in clinic in 4  months suggested.  Labs prior to follow-up visit as ordered..  All questions answered and patient verbalized understanding of the plan.  Lynea Rollison, MD [facility] Endocrinology [facility] [address] [place], [address] [place], [place] [address] Phone # [phone]  At least part of this note was generated using voice recognition software. Inadvertent word errors may have occurred, which were not recognized during the proofreading process.

## 2024-06-10 ENCOUNTER — Other Ambulatory Visit: Payer: Self-pay | Admitting: Family Medicine

## 2024-07-01 ENCOUNTER — Other Ambulatory Visit: Payer: Self-pay | Admitting: Adult Health

## 2024-07-01 DIAGNOSIS — G4710 Hypersomnia, unspecified: Secondary | ICD-10-CM

## 2024-07-01 MED ORDER — METHYLPHENIDATE HCL 20 MG PO TABS: 10.0000 mg | ORAL_TABLET | Freq: Three times a day (TID) | ORAL | 0 refills | Status: AC

## 2024-07-01 NOTE — Telephone Encounter (Signed)
 [id number] - [place] ([place]), [place] - [address], [place] ([place]) [place] [address] Phone: [phone]  Fax: [phone]

## 2024-07-01 NOTE — Addendum Note (Signed)
 Addended by: SHERRYL DUWAINE SQUIBB on: 07/01/2024 11:57 AM   Modules accepted: Orders

## 2024-07-01 NOTE — Addendum Note (Signed)
 Addended by: SHONA SAVANT A on: 07/01/2024 09:11 AM   Modules accepted: Orders

## 2024-07-01 NOTE — Telephone Encounter (Signed)
 Patient came into the office and requested a refill on this prescription please. I verified that the pharmacy is correct.

## 2024-07-03 ENCOUNTER — Other Ambulatory Visit: Payer: Self-pay | Admitting: Endocrinology

## 2024-07-03 DIAGNOSIS — E1165 Type 2 diabetes mellitus with hyperglycemia: Secondary | ICD-10-CM

## 2024-07-18 ENCOUNTER — Ambulatory Visit: Admitting: Neurology

## 2024-09-19 ENCOUNTER — Other Ambulatory Visit

## 2024-09-21 ENCOUNTER — Ambulatory Visit: Admitting: Endocrinology

## 2024-09-29 ENCOUNTER — Ambulatory Visit

## 2024-09-30 ENCOUNTER — Ambulatory Visit
